# Patient Record
Sex: Female | Born: 1955 | Race: Black or African American | Hispanic: No | Marital: Married | Smoking: Former smoker
Health system: Southern US, Community
[De-identification: ages and names within clinical notes are randomized; demographics above are authoritative.]

## PROBLEM LIST (undated history)

## (undated) DIAGNOSIS — R001 Bradycardia, unspecified: Secondary | ICD-10-CM

## (undated) DIAGNOSIS — G4730 Sleep apnea, unspecified: Secondary | ICD-10-CM

## (undated) DIAGNOSIS — E662 Morbid (severe) obesity with alveolar hypoventilation: Secondary | ICD-10-CM

## (undated) DIAGNOSIS — L0390 Cellulitis, unspecified: Secondary | ICD-10-CM

## (undated) DIAGNOSIS — M109 Gout, unspecified: Secondary | ICD-10-CM

## (undated) DIAGNOSIS — I4729 Other ventricular tachycardia: Secondary | ICD-10-CM

## (undated) DIAGNOSIS — I472 Ventricular tachycardia: Secondary | ICD-10-CM

## (undated) DIAGNOSIS — I219 Acute myocardial infarction, unspecified: Secondary | ICD-10-CM

## (undated) DIAGNOSIS — J449 Chronic obstructive pulmonary disease, unspecified: Secondary | ICD-10-CM

## (undated) DIAGNOSIS — M1990 Unspecified osteoarthritis, unspecified site: Secondary | ICD-10-CM

## (undated) DIAGNOSIS — Z9889 Other specified postprocedural states: Secondary | ICD-10-CM

## (undated) DIAGNOSIS — I739 Peripheral vascular disease, unspecified: Secondary | ICD-10-CM

## (undated) DIAGNOSIS — K449 Diaphragmatic hernia without obstruction or gangrene: Secondary | ICD-10-CM

## (undated) DIAGNOSIS — I10 Essential (primary) hypertension: Secondary | ICD-10-CM

## (undated) DIAGNOSIS — E782 Mixed hyperlipidemia: Secondary | ICD-10-CM

## (undated) DIAGNOSIS — I471 Supraventricular tachycardia, unspecified: Secondary | ICD-10-CM

## (undated) DIAGNOSIS — I5032 Chronic diastolic (congestive) heart failure: Secondary | ICD-10-CM

## (undated) DIAGNOSIS — E119 Type 2 diabetes mellitus without complications: Secondary | ICD-10-CM

---

## 1979-01-20 HISTORY — PX: ABDOMINAL HYSTERECTOMY: SHX81

## 1989-01-19 DIAGNOSIS — I219 Acute myocardial infarction, unspecified: Secondary | ICD-10-CM

## 1989-01-19 HISTORY — DX: Acute myocardial infarction, unspecified: I21.9

## 1998-09-22 ENCOUNTER — Encounter: Payer: Self-pay | Admitting: *Deleted

## 1998-09-22 ENCOUNTER — Inpatient Hospital Stay (HOSPITAL_COMMUNITY): Admission: EM | Admit: 1998-09-22 | Discharge: 1998-09-24 | Payer: Self-pay | Admitting: Emergency Medicine

## 1998-09-23 ENCOUNTER — Encounter: Payer: Self-pay | Admitting: Emergency Medicine

## 1998-09-23 ENCOUNTER — Encounter: Payer: Self-pay | Admitting: *Deleted

## 2000-12-29 ENCOUNTER — Emergency Department (HOSPITAL_COMMUNITY): Admission: EM | Admit: 2000-12-29 | Discharge: 2000-12-29 | Payer: Self-pay | Admitting: Emergency Medicine

## 2001-03-15 ENCOUNTER — Emergency Department (HOSPITAL_COMMUNITY): Admission: EM | Admit: 2001-03-15 | Discharge: 2001-03-15 | Payer: Self-pay | Admitting: Emergency Medicine

## 2001-03-15 ENCOUNTER — Encounter: Payer: Self-pay | Admitting: Emergency Medicine

## 2001-09-08 ENCOUNTER — Encounter: Payer: Self-pay | Admitting: Emergency Medicine

## 2001-09-08 ENCOUNTER — Inpatient Hospital Stay (HOSPITAL_COMMUNITY): Admission: EM | Admit: 2001-09-08 | Discharge: 2001-09-12 | Payer: Self-pay | Admitting: Emergency Medicine

## 2002-03-26 ENCOUNTER — Encounter: Payer: Self-pay | Admitting: *Deleted

## 2002-03-26 ENCOUNTER — Emergency Department (HOSPITAL_COMMUNITY): Admission: EM | Admit: 2002-03-26 | Discharge: 2002-03-26 | Payer: Self-pay | Admitting: Emergency Medicine

## 2002-06-02 ENCOUNTER — Encounter: Payer: Self-pay | Admitting: Emergency Medicine

## 2002-06-02 ENCOUNTER — Inpatient Hospital Stay (HOSPITAL_COMMUNITY): Admission: EM | Admit: 2002-06-02 | Discharge: 2002-06-05 | Payer: Self-pay | Admitting: Emergency Medicine

## 2002-10-11 ENCOUNTER — Emergency Department (HOSPITAL_COMMUNITY): Admission: EM | Admit: 2002-10-11 | Discharge: 2002-10-11 | Payer: Self-pay | Admitting: Emergency Medicine

## 2003-02-16 ENCOUNTER — Emergency Department (HOSPITAL_COMMUNITY): Admission: EM | Admit: 2003-02-16 | Discharge: 2003-02-16 | Payer: Self-pay | Admitting: *Deleted

## 2003-02-16 ENCOUNTER — Encounter: Payer: Self-pay | Admitting: *Deleted

## 2003-10-06 ENCOUNTER — Emergency Department (HOSPITAL_COMMUNITY): Admission: EM | Admit: 2003-10-06 | Discharge: 2003-10-06 | Payer: Self-pay | Admitting: Emergency Medicine

## 2003-10-07 ENCOUNTER — Encounter (HOSPITAL_COMMUNITY): Admission: RE | Admit: 2003-10-07 | Discharge: 2003-11-06 | Payer: Self-pay | Admitting: Pulmonary Disease

## 2003-10-11 ENCOUNTER — Encounter (HOSPITAL_COMMUNITY): Admission: RE | Admit: 2003-10-11 | Discharge: 2003-10-14 | Payer: Self-pay | Admitting: Pulmonary Disease

## 2005-07-21 ENCOUNTER — Emergency Department (HOSPITAL_COMMUNITY): Admission: EM | Admit: 2005-07-21 | Discharge: 2005-07-21 | Payer: Self-pay | Admitting: Emergency Medicine

## 2005-09-02 ENCOUNTER — Emergency Department (HOSPITAL_COMMUNITY): Admission: EM | Admit: 2005-09-02 | Discharge: 2005-09-02 | Payer: Self-pay | Admitting: Emergency Medicine

## 2005-09-05 ENCOUNTER — Inpatient Hospital Stay (HOSPITAL_COMMUNITY): Admission: EM | Admit: 2005-09-05 | Discharge: 2005-09-09 | Payer: Self-pay | Admitting: Emergency Medicine

## 2006-01-24 ENCOUNTER — Inpatient Hospital Stay (HOSPITAL_COMMUNITY): Admission: EM | Admit: 2006-01-24 | Discharge: 2006-01-25 | Payer: Self-pay | Admitting: Emergency Medicine

## 2006-02-15 ENCOUNTER — Emergency Department (HOSPITAL_COMMUNITY): Admission: EM | Admit: 2006-02-15 | Discharge: 2006-02-15 | Payer: Self-pay | Admitting: Emergency Medicine

## 2006-03-10 ENCOUNTER — Emergency Department (HOSPITAL_COMMUNITY): Admission: EM | Admit: 2006-03-10 | Discharge: 2006-03-10 | Payer: Self-pay | Admitting: Emergency Medicine

## 2006-08-01 ENCOUNTER — Emergency Department (HOSPITAL_COMMUNITY): Admission: EM | Admit: 2006-08-01 | Discharge: 2006-08-02 | Payer: Self-pay | Admitting: Emergency Medicine

## 2007-02-09 ENCOUNTER — Emergency Department (HOSPITAL_COMMUNITY): Admission: EM | Admit: 2007-02-09 | Discharge: 2007-02-09 | Payer: Self-pay | Admitting: Emergency Medicine

## 2007-02-12 ENCOUNTER — Emergency Department (HOSPITAL_COMMUNITY): Admission: EM | Admit: 2007-02-12 | Discharge: 2007-02-12 | Payer: Self-pay | Admitting: Emergency Medicine

## 2007-03-22 ENCOUNTER — Inpatient Hospital Stay (HOSPITAL_COMMUNITY): Admission: EM | Admit: 2007-03-22 | Discharge: 2007-03-30 | Payer: Self-pay | Admitting: Emergency Medicine

## 2007-03-22 ENCOUNTER — Ambulatory Visit: Payer: Self-pay | Admitting: Cardiology

## 2007-03-24 ENCOUNTER — Encounter: Payer: Self-pay | Admitting: Cardiology

## 2007-04-16 ENCOUNTER — Ambulatory Visit: Payer: Self-pay

## 2007-05-25 ENCOUNTER — Inpatient Hospital Stay (HOSPITAL_COMMUNITY): Admission: EM | Admit: 2007-05-25 | Discharge: 2007-05-28 | Payer: Self-pay | Admitting: Emergency Medicine

## 2007-05-25 ENCOUNTER — Ambulatory Visit: Payer: Self-pay | Admitting: Cardiology

## 2007-06-05 ENCOUNTER — Emergency Department (HOSPITAL_COMMUNITY): Admission: EM | Admit: 2007-06-05 | Discharge: 2007-06-05 | Payer: Self-pay | Admitting: Emergency Medicine

## 2007-06-13 ENCOUNTER — Ambulatory Visit: Payer: Self-pay | Admitting: Cardiology

## 2007-07-08 ENCOUNTER — Inpatient Hospital Stay (HOSPITAL_COMMUNITY): Admission: EM | Admit: 2007-07-08 | Discharge: 2007-07-16 | Payer: Self-pay | Admitting: Emergency Medicine

## 2007-07-16 ENCOUNTER — Inpatient Hospital Stay (HOSPITAL_COMMUNITY)
Admission: RE | Admit: 2007-07-16 | Discharge: 2007-07-23 | Payer: Self-pay | Admitting: Physical Medicine & Rehabilitation

## 2007-07-16 ENCOUNTER — Ambulatory Visit: Payer: Self-pay | Admitting: Physical Medicine & Rehabilitation

## 2007-08-02 ENCOUNTER — Emergency Department (HOSPITAL_COMMUNITY): Admission: EM | Admit: 2007-08-02 | Discharge: 2007-08-02 | Payer: Self-pay | Admitting: Emergency Medicine

## 2007-11-26 ENCOUNTER — Ambulatory Visit: Payer: Self-pay | Admitting: Internal Medicine

## 2008-01-09 ENCOUNTER — Ambulatory Visit (HOSPITAL_COMMUNITY): Admission: RE | Admit: 2008-01-09 | Discharge: 2008-01-09 | Payer: Self-pay | Admitting: Licensed Clinical Social Worker

## 2008-02-13 ENCOUNTER — Encounter: Payer: Self-pay | Admitting: Internal Medicine

## 2008-02-13 ENCOUNTER — Ambulatory Visit: Payer: Self-pay | Admitting: Internal Medicine

## 2008-02-13 DIAGNOSIS — N318 Other neuromuscular dysfunction of bladder: Secondary | ICD-10-CM

## 2008-02-13 DIAGNOSIS — E118 Type 2 diabetes mellitus with unspecified complications: Secondary | ICD-10-CM

## 2008-02-13 DIAGNOSIS — G4733 Obstructive sleep apnea (adult) (pediatric): Secondary | ICD-10-CM

## 2008-02-13 DIAGNOSIS — E1165 Type 2 diabetes mellitus with hyperglycemia: Secondary | ICD-10-CM

## 2008-02-13 LAB — CONVERTED CEMR LAB
BUN: 11 mg/dL (ref 6–23)
Bilirubin Urine: NEGATIVE
Chloride: 102 meq/L (ref 96–112)
Ketones, urine, test strip: NEGATIVE
Nitrite: NEGATIVE
Potassium: 4 meq/L (ref 3.5–5.3)
Sodium: 142 meq/L (ref 135–145)
Specific Gravity, Urine: 1.025
Urobilinogen, UA: 0.2

## 2008-02-16 ENCOUNTER — Ambulatory Visit: Payer: Self-pay | Admitting: Internal Medicine

## 2008-02-16 DIAGNOSIS — M1990 Unspecified osteoarthritis, unspecified site: Secondary | ICD-10-CM | POA: Insufficient documentation

## 2008-02-16 DIAGNOSIS — K219 Gastro-esophageal reflux disease without esophagitis: Secondary | ICD-10-CM

## 2008-02-16 DIAGNOSIS — R0602 Shortness of breath: Secondary | ICD-10-CM

## 2008-02-16 DIAGNOSIS — Z8679 Personal history of other diseases of the circulatory system: Secondary | ICD-10-CM | POA: Insufficient documentation

## 2008-02-16 DIAGNOSIS — I10 Essential (primary) hypertension: Secondary | ICD-10-CM | POA: Insufficient documentation

## 2008-02-16 DIAGNOSIS — E785 Hyperlipidemia, unspecified: Secondary | ICD-10-CM | POA: Insufficient documentation

## 2008-02-16 DIAGNOSIS — M545 Low back pain, unspecified: Secondary | ICD-10-CM | POA: Insufficient documentation

## 2008-02-16 DIAGNOSIS — I2510 Atherosclerotic heart disease of native coronary artery without angina pectoris: Secondary | ICD-10-CM | POA: Insufficient documentation

## 2008-02-16 DIAGNOSIS — J45909 Unspecified asthma, uncomplicated: Secondary | ICD-10-CM | POA: Insufficient documentation

## 2008-02-16 DIAGNOSIS — M069 Rheumatoid arthritis, unspecified: Secondary | ICD-10-CM | POA: Insufficient documentation

## 2008-02-20 ENCOUNTER — Ambulatory Visit: Payer: Self-pay | Admitting: Internal Medicine

## 2008-03-05 ENCOUNTER — Ambulatory Visit: Payer: Self-pay | Admitting: Internal Medicine

## 2008-03-05 LAB — CONVERTED CEMR LAB: Blood Glucose, Fingerstick: 152

## 2008-04-09 ENCOUNTER — Ambulatory Visit: Payer: Self-pay | Admitting: Internal Medicine

## 2008-04-13 ENCOUNTER — Ambulatory Visit: Payer: Self-pay | Admitting: Internal Medicine

## 2008-04-28 ENCOUNTER — Ambulatory Visit: Payer: Self-pay | Admitting: Internal Medicine

## 2008-05-04 ENCOUNTER — Telehealth (INDEPENDENT_AMBULATORY_CARE_PROVIDER_SITE_OTHER): Payer: Self-pay | Admitting: *Deleted

## 2008-05-07 IMAGING — CT CT ANGIO CHEST
1 of 2 series · 19 of 32 positions shown · IV contrast (omnipaque)
Comparison: 06/05/07.

CLINICAL DATA: Short of breath.
 CT ANGIOGRAPHY OF CHEST:
TECHNIQUE: Multidetector CT imaging of the chest was performed during bolus injection of intravenous contrast.  Multiplanar CT angiographic image reconstructions were generated to evaluate the vascular anatomy.
 Contrast:  100 cc Omnipaque 350.

[Series 9: thin pacs · axial · 0.65mm/px · z∈[-286,-58]mm · 19 of 254 slices shown]
[im 13/254  lung]
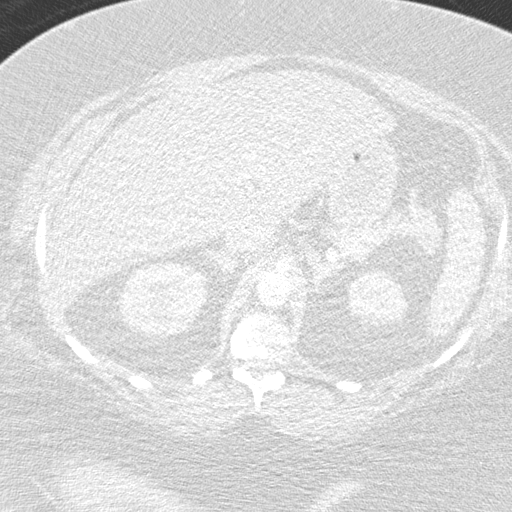
[im 26/254  mediastinal]
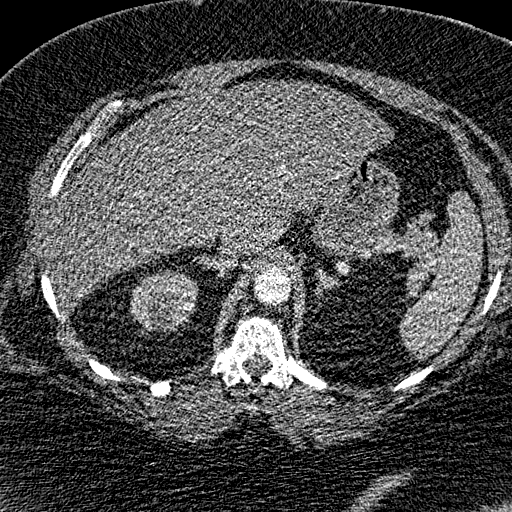
[im 38/254  lung]
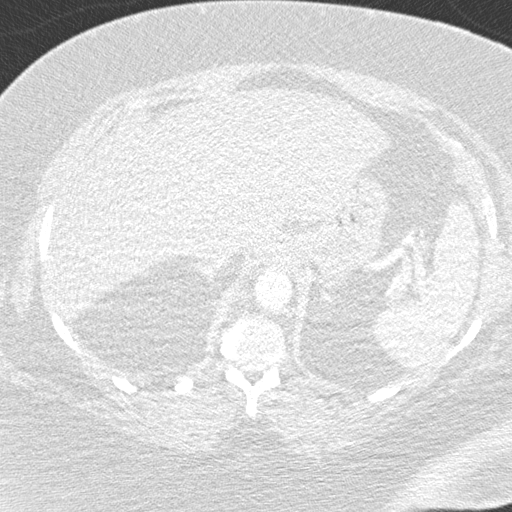
[im 64/254  mediastinal]
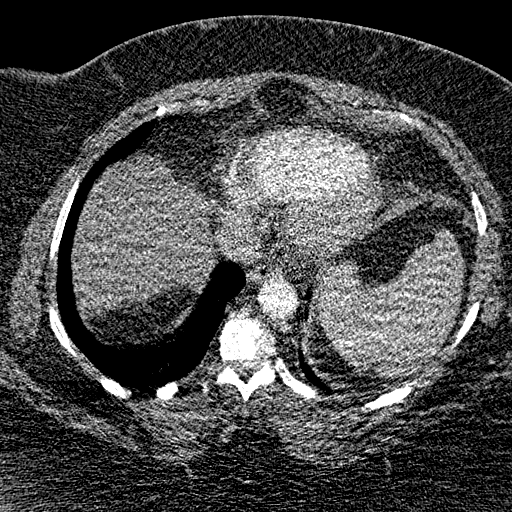
[im 76/254  lung]
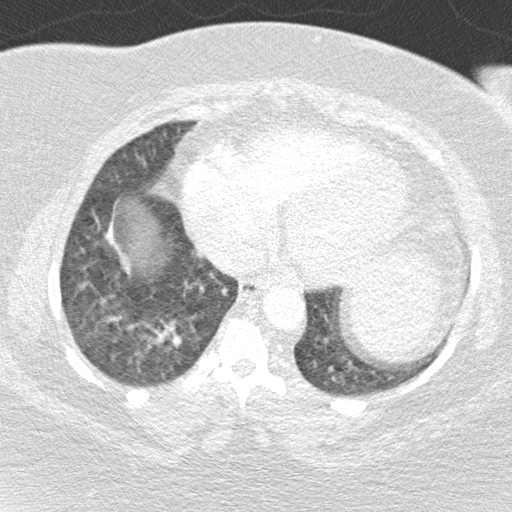
[im 85/254  mediastinal]
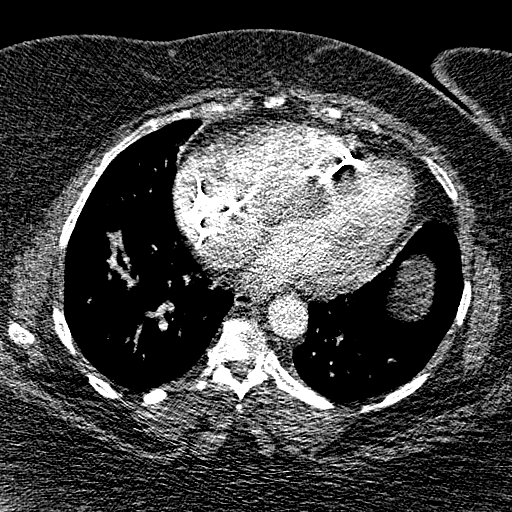
[im 89/254  lung]
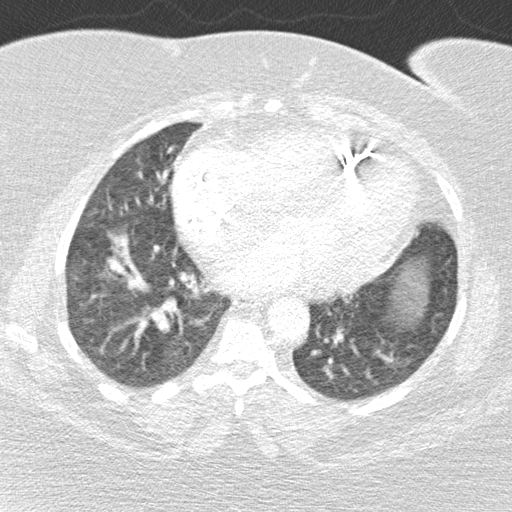
[im 102/254  mediastinal]
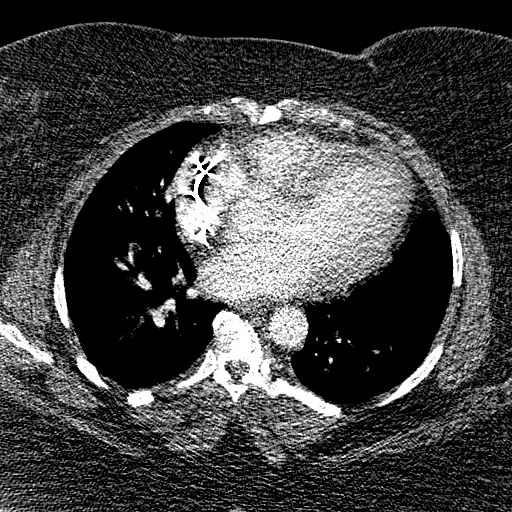
[im 114/254  lung]
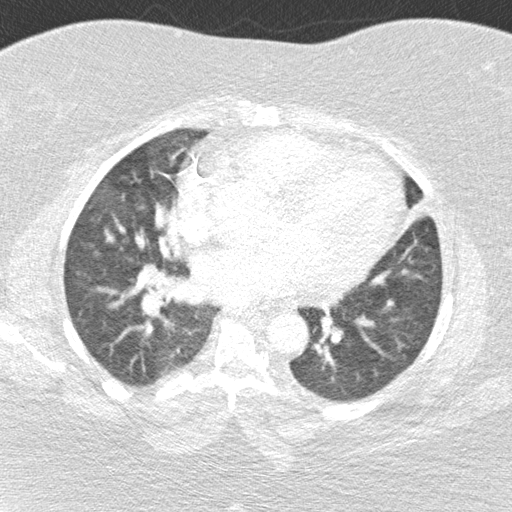
[im 127/254  mediastinal]
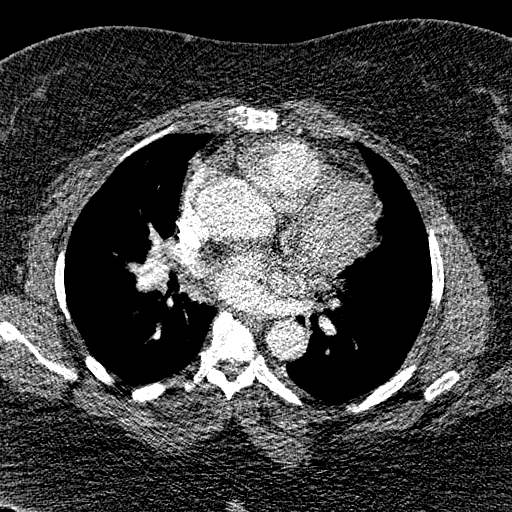
[im 140/254  lung]
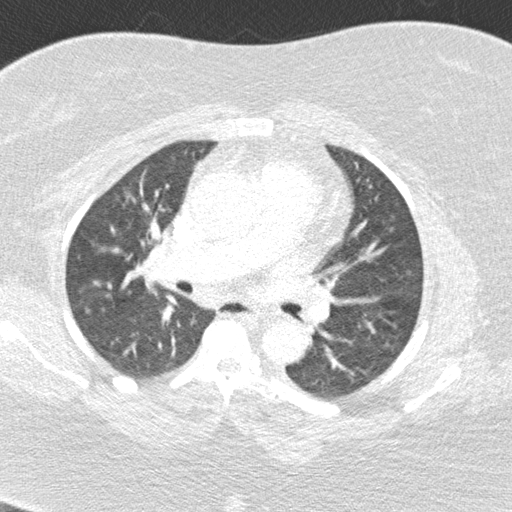
[im 152/254  mediastinal]
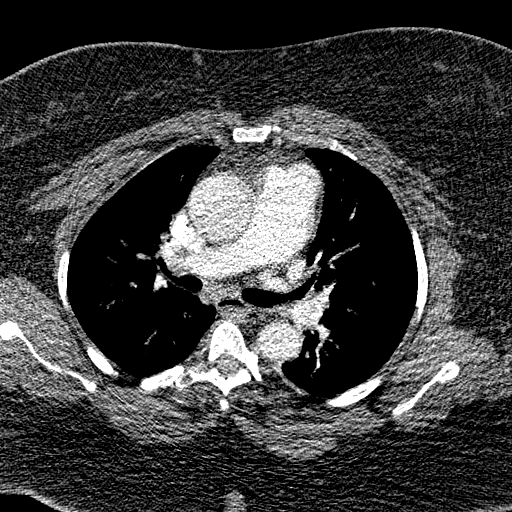
[im 165/254  lung]
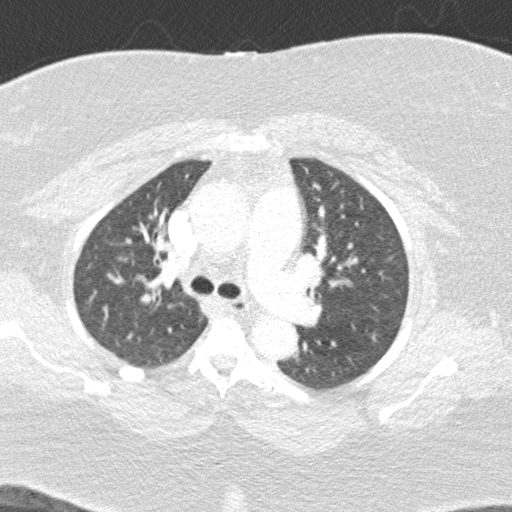
[im 169/254  mediastinal]
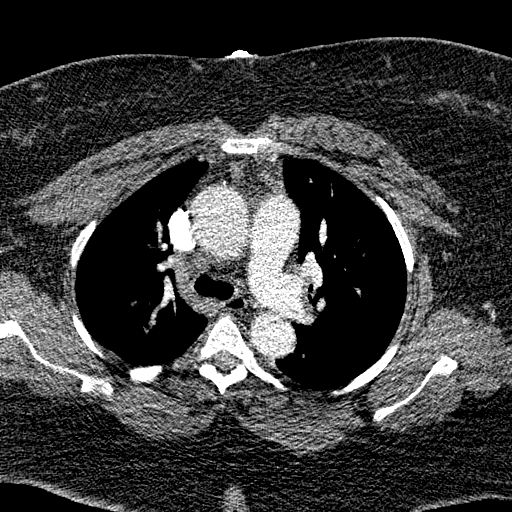
[im 178/254  lung]
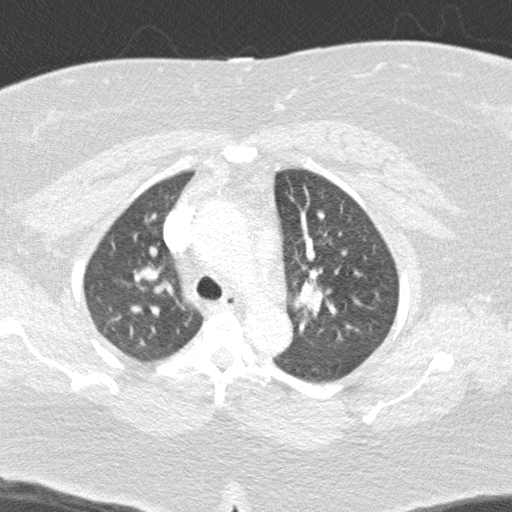
[im 190/254  mediastinal]
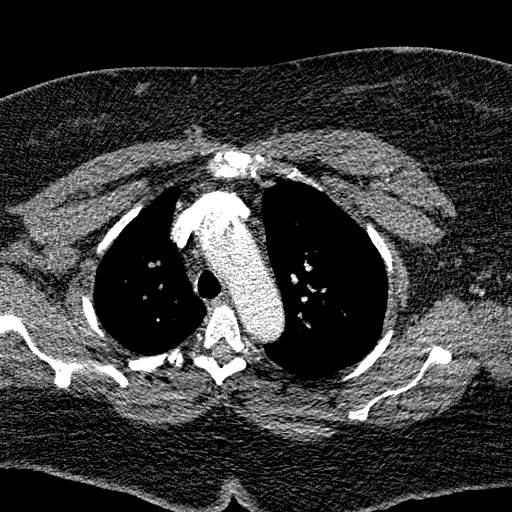
[im 216/254  lung]
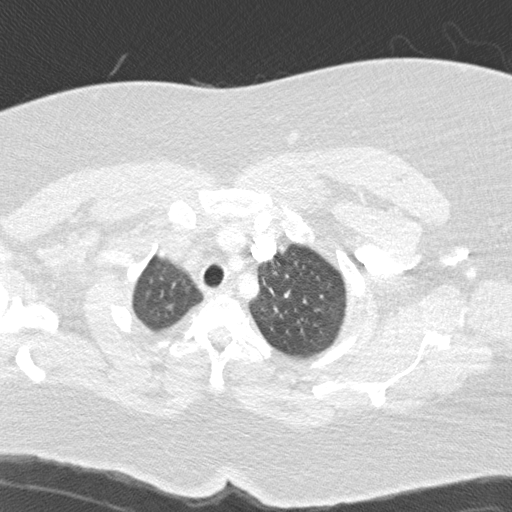
[im 228/254  mediastinal]
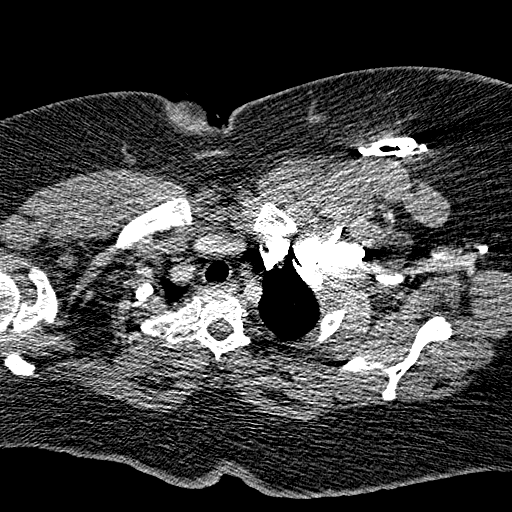
[im 241/254  lung]
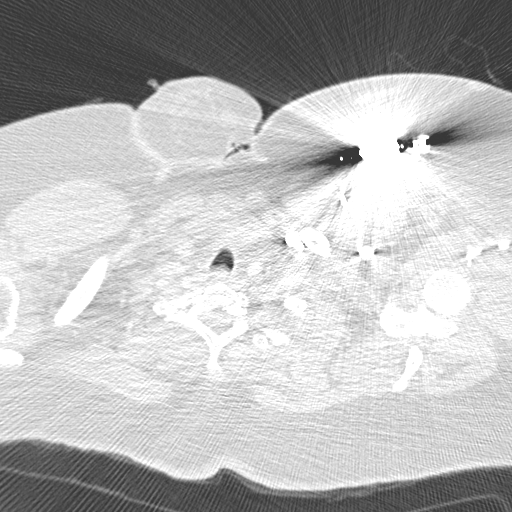

[19 of 32 positions shown; findings below may reference images not displayed]

FINDINGS: The study is significantly limited technically by the patient?s body habitus.  There are no definite pulmonary emboli demonstrated.  The large central vessels are fairly well opacified and I feel that there are no major emboli. I think it is difficult to rule out small peripheral emboli but I do not have a high index of suspicion for emboli.
 There is also breathing motion artifact that is causing some degradation of image quality.
 The lungs are essentially clear given the limitations of the breathing motion artifact at the bases.  No definite pleural or pericardial fluid. No gross adenopathy.
IMPRESSION: 1.  Study is limited technically by the patient?s body habitus, and breathing motion artifact.
 2.  No definite pulmonary emboli and no active pulmonary process identified.

## 2008-05-08 IMAGING — CR DG CHEST 1V PORT SAME DAY
1 series · 1 of 1 positions shown · non-contrast
Comparison: 07/09/07.

CLINICAL DATA: Endotracheal tube adjustment.
 PORTABLE CHEST - 1 VIEW:

[view not recorded]
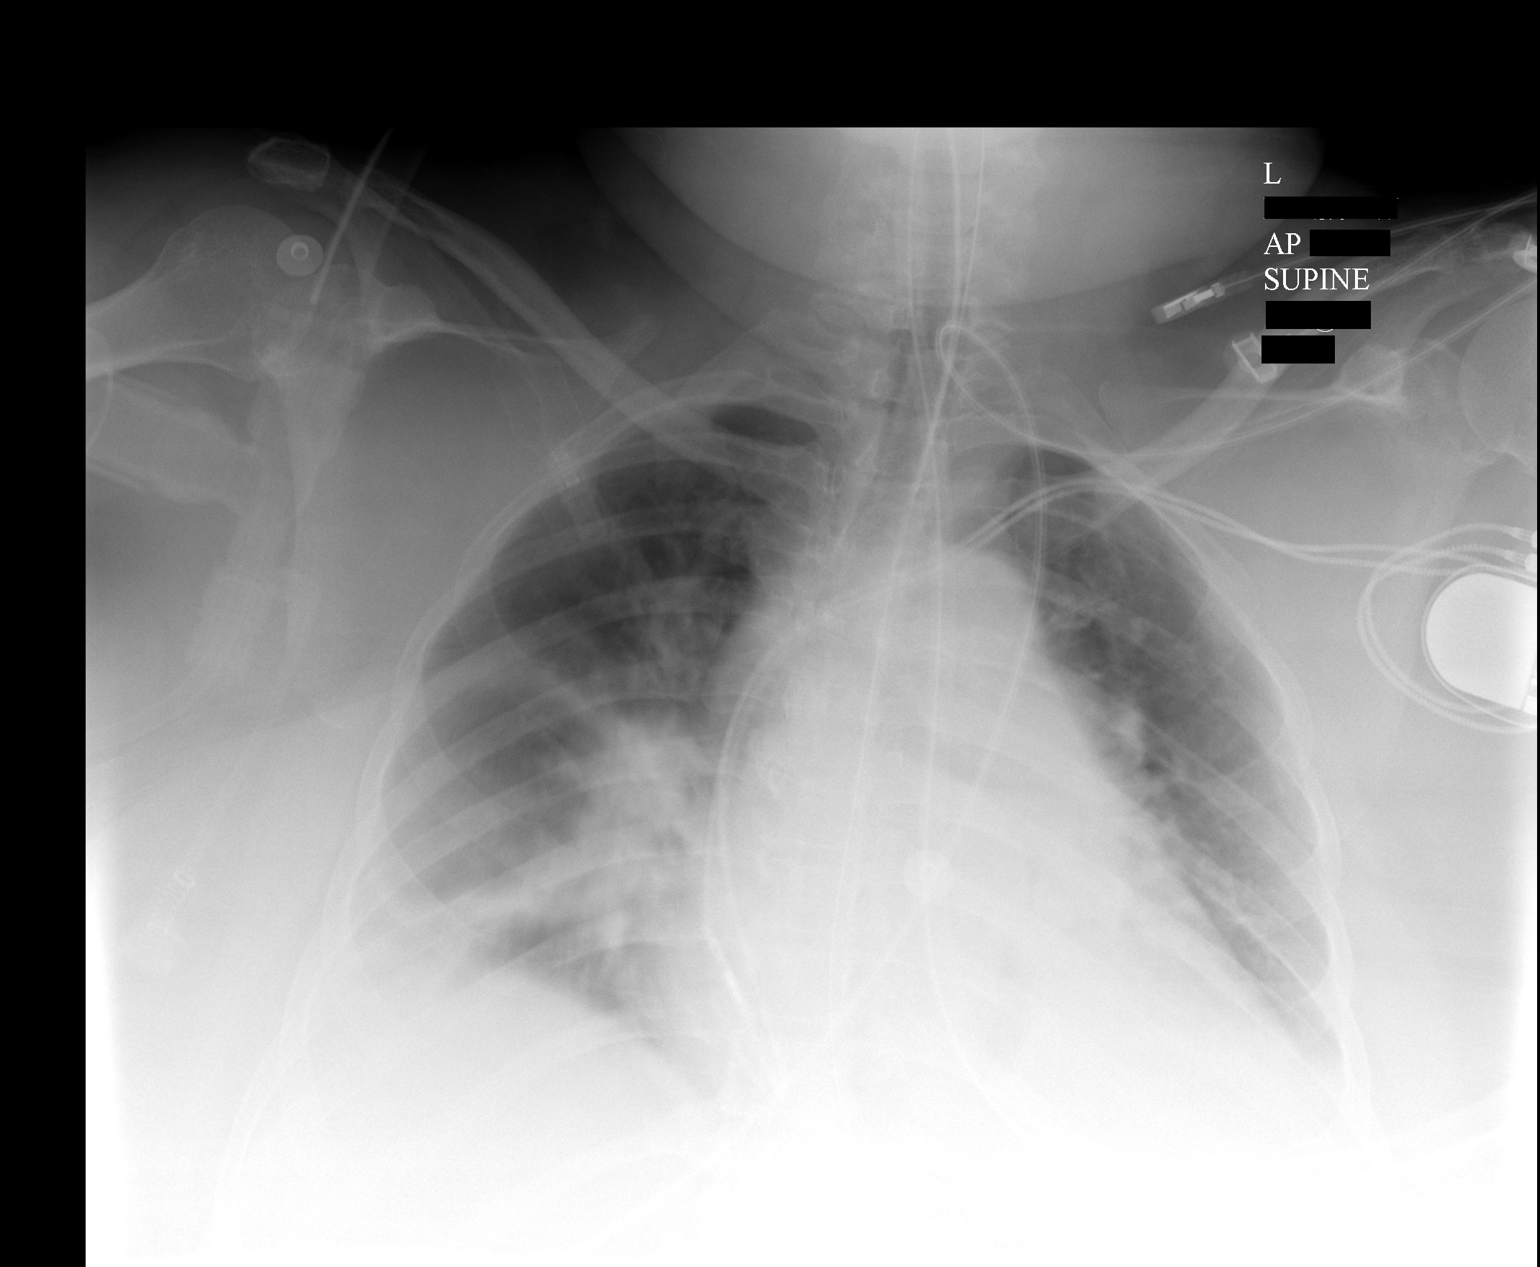

[1 of 1 positions shown; findings below may reference images not displayed]

FINDINGS: The endotracheal tube has been retracted and is now 4 cm above the carina.  Airspace opacities throughout the left lung have improved. Airspace disease in the right mid and lower lung zone is worse.  The NG tube is unchanged. No pneumothorax.
IMPRESSION: 1.  Endotracheal tube retracted.
 2.  Improved airspace disease throughout the left lung.
 3.  Worsening right basilar airspace disease.

## 2008-05-11 IMAGING — CR DG CHEST 1V PORT
1 series · 1 of 1 positions shown · non-contrast
Comparison: 07/11/2007

CLINICAL DATA: Respiratory failure. 
 PORTABLE CHEST [DATE]/[id number] HOURS:

[view not recorded]
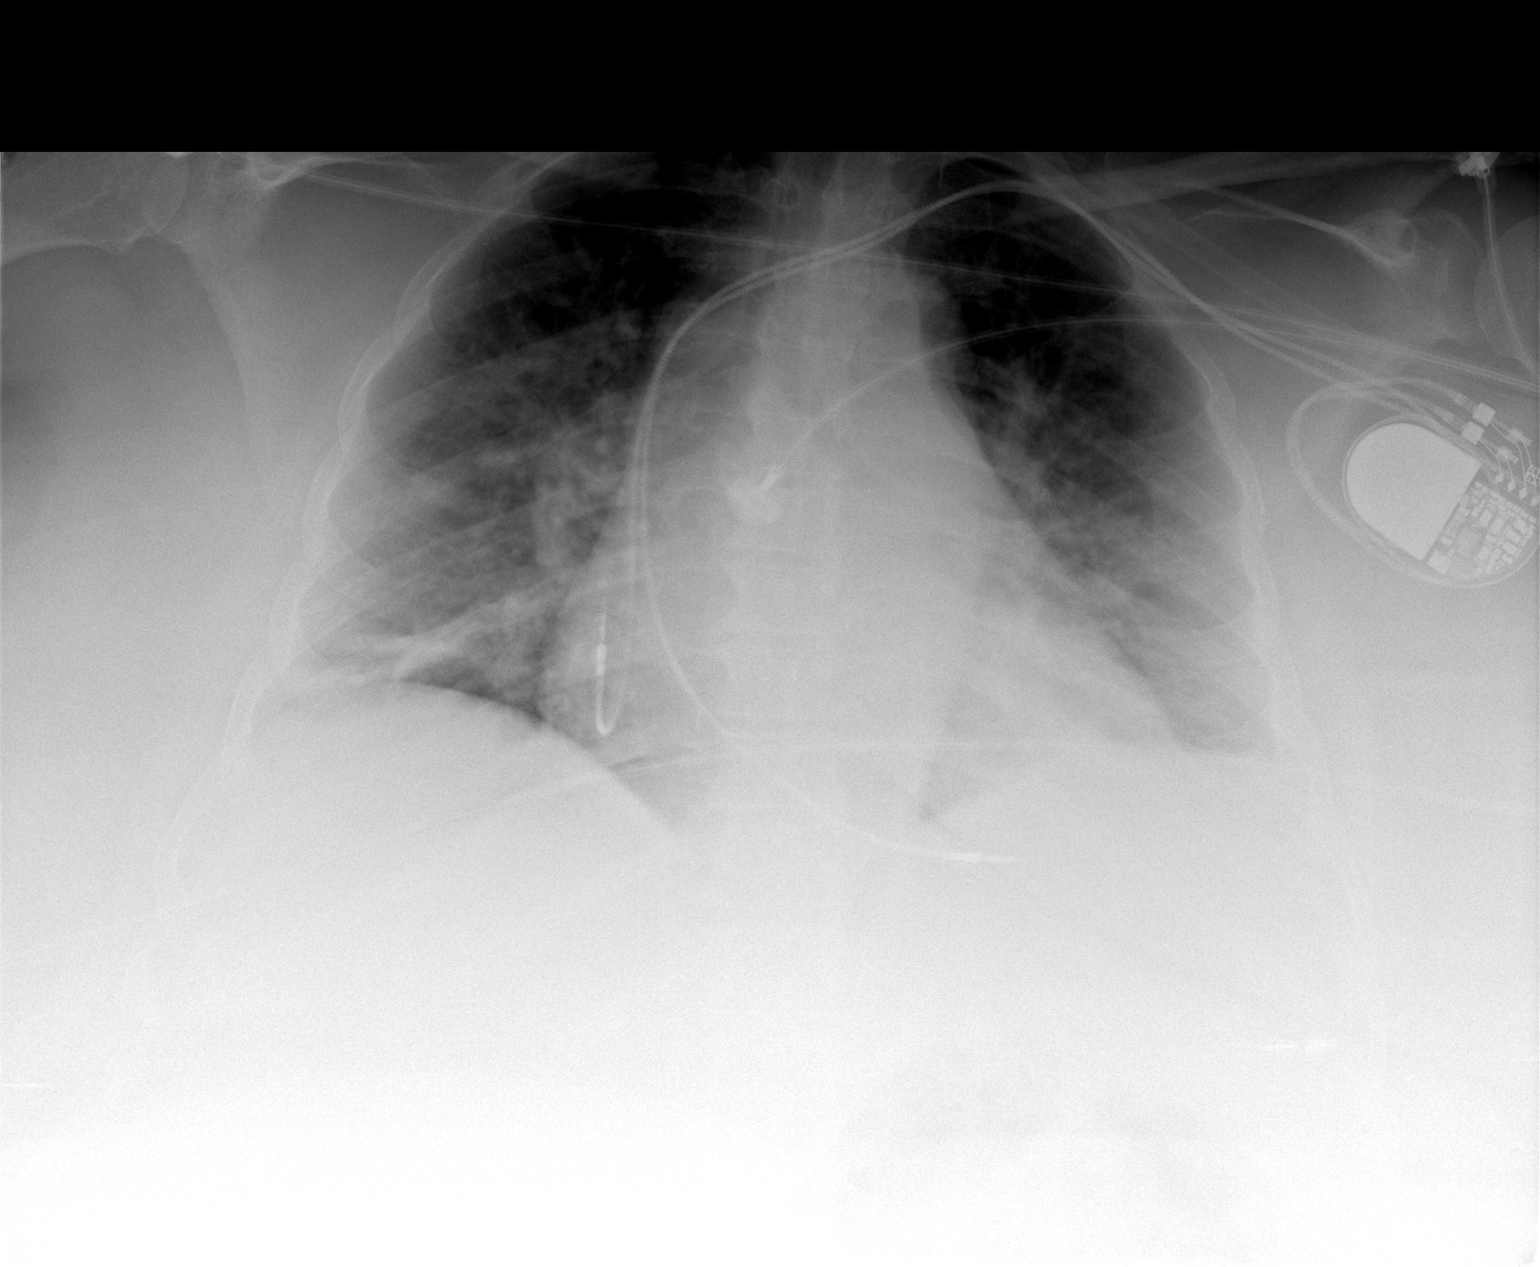

[1 of 1 positions shown; findings below may reference images not displayed]

FINDINGS: There remains atelectasis versus infiltrate in the right lower lung zone.  Stable edema.  The patient has been extubated.  Aeration at the left lung base has improved slightly.
IMPRESSION: Improved aeration at left lung base after extubation.  Residual atelectasis and/or infiltrate at the right lung base.

## 2008-05-17 ENCOUNTER — Telehealth (INDEPENDENT_AMBULATORY_CARE_PROVIDER_SITE_OTHER): Payer: Self-pay | Admitting: *Deleted

## 2008-05-21 HISTORY — PX: INSERT / REPLACE / REMOVE PACEMAKER: SUR710

## 2008-05-31 ENCOUNTER — Ambulatory Visit: Payer: Self-pay | Admitting: Internal Medicine

## 2008-06-14 ENCOUNTER — Ambulatory Visit: Payer: Self-pay | Admitting: Internal Medicine

## 2008-06-14 LAB — CONVERTED CEMR LAB
Blood Glucose, Fingerstick: 113
Hgb A1c MFr Bld: 6.7 %

## 2008-06-15 ENCOUNTER — Encounter (INDEPENDENT_AMBULATORY_CARE_PROVIDER_SITE_OTHER): Payer: Self-pay | Admitting: Internal Medicine

## 2008-06-16 LAB — CONVERTED CEMR LAB
AST: 10 units/L (ref 0–37)
Albumin: 4 g/dL (ref 3.5–5.2)
Alkaline Phosphatase: 74 units/L (ref 39–117)
Basophils Absolute: 0 10*3/uL (ref 0.0–0.1)
Basophils Relative: 0 % (ref 0–1)
Eosinophils Absolute: 0.3 10*3/uL (ref 0.0–0.7)
Eosinophils Relative: 3 % (ref 0–5)
HCT: 36.8 % (ref 36.0–46.0)
LDL Cholesterol: 121 mg/dL — ABNORMAL HIGH (ref 0–99)
MCHC: 31.3 g/dL (ref 30.0–36.0)
MCV: 86.4 fL (ref 78.0–100.0)
Microalb, Ur: 191 mg/dL — ABNORMAL HIGH (ref 0.00–1.89)
Neutrophils Relative %: 72 % (ref 43–77)
Platelets: 288 10*3/uL (ref 150–400)
Potassium: 3.8 meq/L (ref 3.5–5.3)
RDW: 18 % — ABNORMAL HIGH (ref 11.5–15.5)
Sodium: 145 meq/L (ref 135–145)
Total Bilirubin: 0.4 mg/dL (ref 0.3–1.2)
Total Protein: 6.8 g/dL (ref 6.0–8.3)
Triglycerides: 190 mg/dL — ABNORMAL HIGH (ref <150)
VLDL: 38 mg/dL (ref 0–40)
WBC: 7.9 10*3/uL (ref 4.0–10.5)

## 2008-07-13 ENCOUNTER — Encounter (INDEPENDENT_AMBULATORY_CARE_PROVIDER_SITE_OTHER): Payer: Self-pay | Admitting: Internal Medicine

## 2008-07-28 ENCOUNTER — Emergency Department (HOSPITAL_COMMUNITY): Admission: EM | Admit: 2008-07-28 | Discharge: 2008-07-28 | Payer: Self-pay | Admitting: Emergency Medicine

## 2008-07-30 ENCOUNTER — Ambulatory Visit: Payer: Self-pay | Admitting: Internal Medicine

## 2008-08-27 ENCOUNTER — Encounter (INDEPENDENT_AMBULATORY_CARE_PROVIDER_SITE_OTHER): Payer: Self-pay

## 2008-09-18 ENCOUNTER — Emergency Department (HOSPITAL_COMMUNITY): Admission: EM | Admit: 2008-09-18 | Discharge: 2008-09-18 | Payer: Self-pay | Admitting: Emergency Medicine

## 2008-09-27 ENCOUNTER — Telehealth (INDEPENDENT_AMBULATORY_CARE_PROVIDER_SITE_OTHER): Payer: Self-pay | Admitting: Internal Medicine

## 2008-10-25 ENCOUNTER — Telehealth (INDEPENDENT_AMBULATORY_CARE_PROVIDER_SITE_OTHER): Payer: Self-pay | Admitting: Internal Medicine

## 2008-10-25 ENCOUNTER — Emergency Department (HOSPITAL_COMMUNITY): Admission: EM | Admit: 2008-10-25 | Discharge: 2008-10-25 | Payer: Self-pay | Admitting: Emergency Medicine

## 2008-10-27 ENCOUNTER — Ambulatory Visit: Payer: Self-pay | Admitting: Internal Medicine

## 2008-10-27 DIAGNOSIS — D509 Iron deficiency anemia, unspecified: Secondary | ICD-10-CM | POA: Insufficient documentation

## 2008-10-27 DIAGNOSIS — M25569 Pain in unspecified knee: Secondary | ICD-10-CM

## 2008-11-10 ENCOUNTER — Ambulatory Visit: Payer: Self-pay | Admitting: Internal Medicine

## 2008-11-10 LAB — CONVERTED CEMR LAB: Blood Glucose, Fingerstick: 127

## 2008-12-09 ENCOUNTER — Emergency Department (HOSPITAL_COMMUNITY): Admission: EM | Admit: 2008-12-09 | Discharge: 2008-12-09 | Payer: Self-pay | Admitting: Emergency Medicine

## 2008-12-14 ENCOUNTER — Ambulatory Visit: Payer: Self-pay | Admitting: Internal Medicine

## 2008-12-14 DIAGNOSIS — I498 Other specified cardiac arrhythmias: Secondary | ICD-10-CM | POA: Insufficient documentation

## 2008-12-20 ENCOUNTER — Encounter: Payer: Self-pay | Admitting: Adult Health

## 2008-12-20 ENCOUNTER — Ambulatory Visit: Payer: Self-pay

## 2009-02-24 ENCOUNTER — Ambulatory Visit: Payer: Self-pay | Admitting: Cardiovascular Disease

## 2009-02-25 ENCOUNTER — Encounter (INDEPENDENT_AMBULATORY_CARE_PROVIDER_SITE_OTHER): Payer: Self-pay | Admitting: Internal Medicine

## 2009-02-25 ENCOUNTER — Ambulatory Visit: Payer: Self-pay | Admitting: Internal Medicine

## 2009-02-25 ENCOUNTER — Encounter: Payer: Self-pay | Admitting: Internal Medicine

## 2009-02-25 ENCOUNTER — Inpatient Hospital Stay (HOSPITAL_COMMUNITY): Admission: AD | Admit: 2009-02-25 | Discharge: 2009-03-02 | Payer: Self-pay | Admitting: Cardiology

## 2009-02-25 ENCOUNTER — Ambulatory Visit: Payer: Self-pay | Admitting: Cardiovascular Disease

## 2009-02-27 ENCOUNTER — Encounter: Payer: Self-pay | Admitting: Cardiology

## 2009-02-28 ENCOUNTER — Encounter (INDEPENDENT_AMBULATORY_CARE_PROVIDER_SITE_OTHER): Payer: Self-pay | Admitting: Internal Medicine

## 2009-03-03 ENCOUNTER — Encounter: Payer: Self-pay | Admitting: Cardiology

## 2009-03-14 ENCOUNTER — Encounter: Payer: Self-pay | Admitting: Cardiology

## 2009-03-14 LAB — CONVERTED CEMR LAB
BUN: 21 mg/dL (ref 6–23)
Calcium: 9.4 mg/dL (ref 8.4–10.5)
Glucose, Bld: 99 mg/dL (ref 70–99)
Potassium: 4.1 meq/L (ref 3.5–5.3)
Sodium: 142 meq/L (ref 135–145)

## 2009-03-16 ENCOUNTER — Encounter (INDEPENDENT_AMBULATORY_CARE_PROVIDER_SITE_OTHER): Payer: Self-pay | Admitting: *Deleted

## 2009-03-17 ENCOUNTER — Ambulatory Visit: Payer: Self-pay | Admitting: Cardiology

## 2009-04-19 ENCOUNTER — Encounter: Payer: Self-pay | Admitting: Emergency Medicine

## 2009-04-19 ENCOUNTER — Ambulatory Visit: Payer: Self-pay | Admitting: Cardiology

## 2009-04-19 ENCOUNTER — Encounter (INDEPENDENT_AMBULATORY_CARE_PROVIDER_SITE_OTHER): Payer: Self-pay | Admitting: *Deleted

## 2009-04-19 ENCOUNTER — Observation Stay (HOSPITAL_COMMUNITY): Admission: AD | Admit: 2009-04-19 | Discharge: 2009-04-21 | Payer: Self-pay | Admitting: Cardiology

## 2009-04-19 LAB — CONVERTED CEMR LAB
ALT: 14 units/L
AST: 18 units/L
CO2: 34 meq/L
GFR calc non Af Amer: >60 mL/min
Glomerular Filtration Rate, Af Am: >60 mL/min/{1.73_m2}
Glucose, Bld: 152 mg/dL
Potassium: 3.7 meq/L
Sodium: 139 meq/L
Total Protein: 7.1 g/dL

## 2009-06-15 ENCOUNTER — Encounter (INDEPENDENT_AMBULATORY_CARE_PROVIDER_SITE_OTHER): Payer: Self-pay | Admitting: *Deleted

## 2009-06-22 DIAGNOSIS — I442 Atrioventricular block, complete: Secondary | ICD-10-CM

## 2009-06-22 DIAGNOSIS — I5033 Acute on chronic diastolic (congestive) heart failure: Secondary | ICD-10-CM

## 2009-06-22 DIAGNOSIS — Z950 Presence of cardiac pacemaker: Secondary | ICD-10-CM

## 2009-07-06 ENCOUNTER — Encounter: Payer: Self-pay | Admitting: Cardiology

## 2009-07-08 ENCOUNTER — Encounter: Payer: Self-pay | Admitting: Internal Medicine

## 2009-07-08 ENCOUNTER — Ambulatory Visit: Payer: Self-pay | Admitting: Cardiology

## 2009-07-14 ENCOUNTER — Ambulatory Visit: Payer: Self-pay | Admitting: Cardiology

## 2009-07-14 ENCOUNTER — Encounter: Payer: Self-pay | Admitting: Cardiology

## 2009-07-14 ENCOUNTER — Inpatient Hospital Stay (HOSPITAL_COMMUNITY): Admission: EM | Admit: 2009-07-14 | Discharge: 2009-07-26 | Payer: Self-pay | Admitting: Emergency Medicine

## 2009-08-08 ENCOUNTER — Encounter (INDEPENDENT_AMBULATORY_CARE_PROVIDER_SITE_OTHER): Payer: Self-pay | Admitting: *Deleted

## 2009-08-10 ENCOUNTER — Encounter: Payer: Self-pay | Admitting: Internal Medicine

## 2009-08-25 IMAGING — CR DG CHEST 1V PORT
1 series · 1 of 1 positions shown · non-contrast
Comparison: 09/18/2008

CLINICAL DATA: Chest pain, shortness of breath

PORTABLE CHEST - 1 VIEW

[view not recorded]
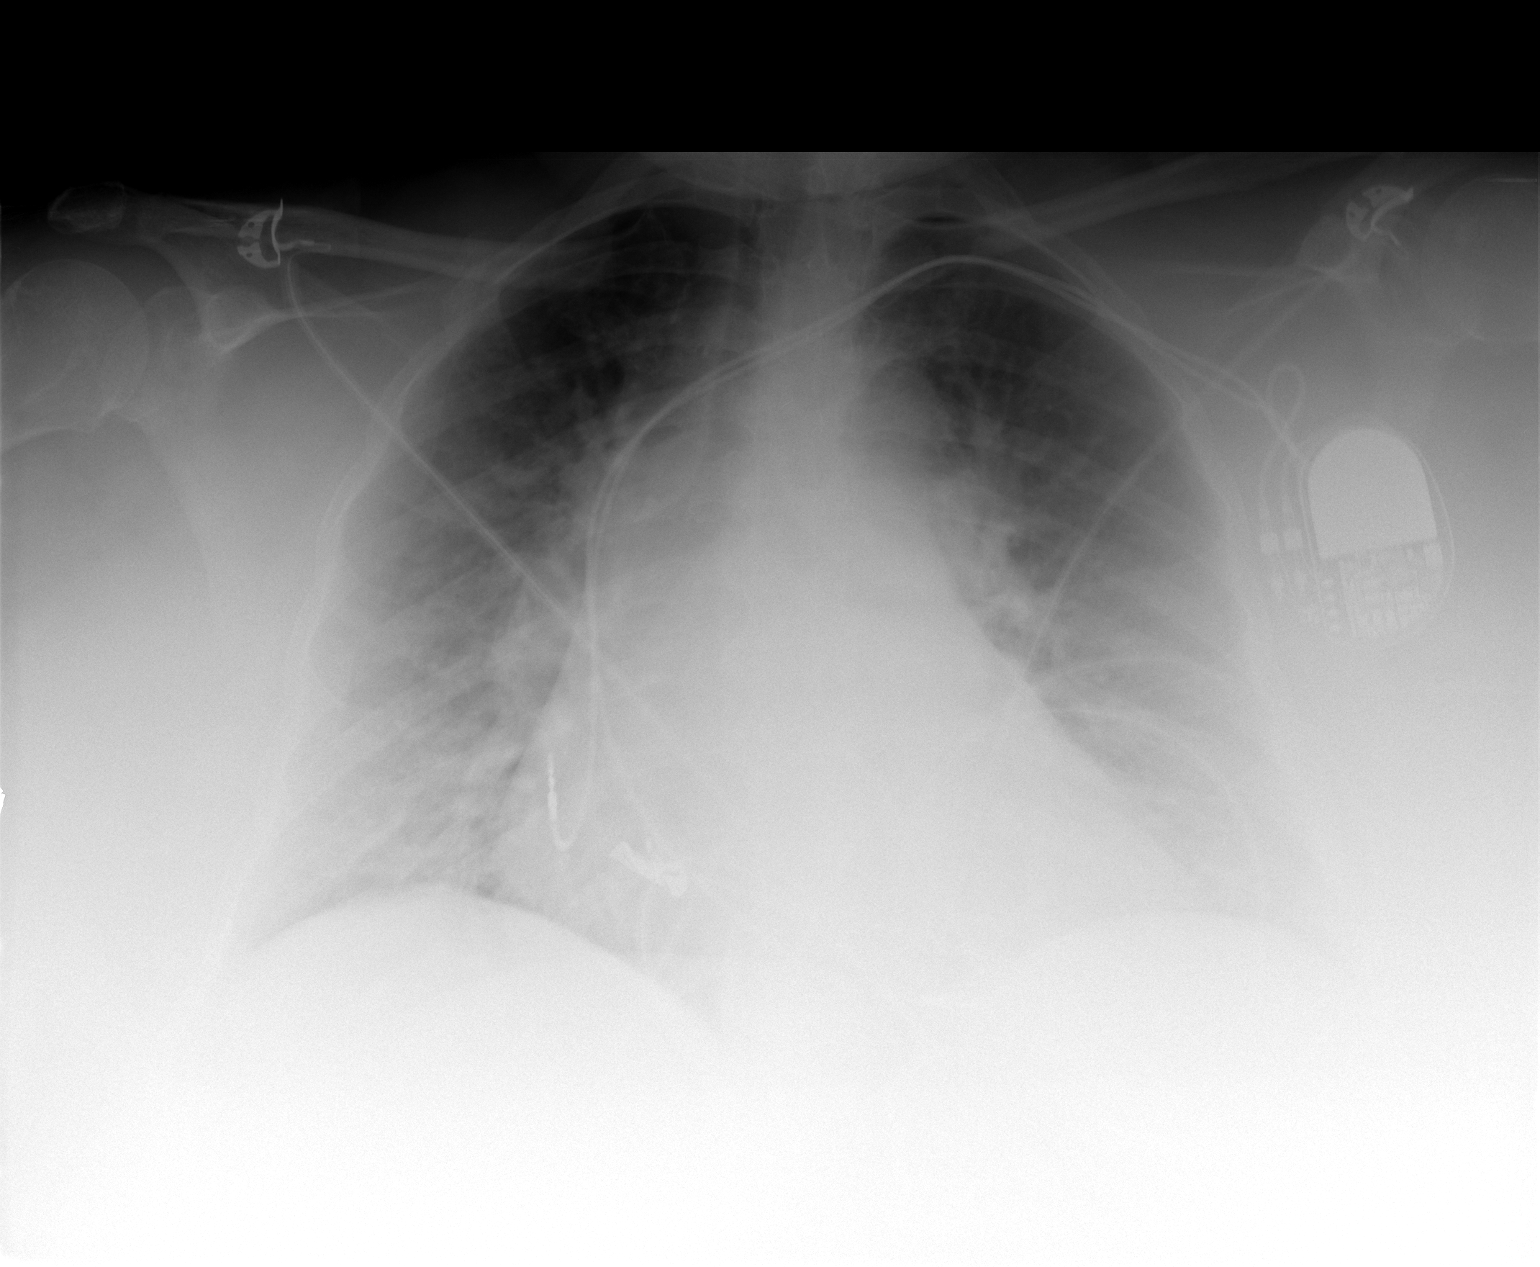

[1 of 1 positions shown; findings below may reference images not displayed]

FINDINGS: Fine detail obscured by patient obesity, motion, and
portable technique.  Dual lead left-sided pacer again noted.
Cardiomegaly noted without overt edema.  Lungs are grossly clear
but central vascular congestion noted.
IMPRESSION: Cardiomegaly and central vascular congestion.  Findings would be
better assessed on non portable PA and lateral chest radiographs
obtained at full inspiration when the patient is able.

## 2009-09-14 ENCOUNTER — Encounter: Payer: Self-pay | Admitting: Internal Medicine

## 2009-09-17 ENCOUNTER — Encounter (INDEPENDENT_AMBULATORY_CARE_PROVIDER_SITE_OTHER): Payer: Self-pay | Admitting: *Deleted

## 2009-09-17 LAB — CONVERTED CEMR LAB
CO2: 34 meq/L
Chloride: 98 meq/L
MCV: 87.3 fL
Platelets: 263 10*3/uL
Potassium: 3.7 meq/L
WBC: 9.3 10*3/uL

## 2009-09-23 ENCOUNTER — Encounter (INDEPENDENT_AMBULATORY_CARE_PROVIDER_SITE_OTHER): Payer: Self-pay | Admitting: *Deleted

## 2009-09-27 ENCOUNTER — Encounter (INDEPENDENT_AMBULATORY_CARE_PROVIDER_SITE_OTHER): Payer: Self-pay | Admitting: *Deleted

## 2009-10-08 ENCOUNTER — Encounter (INDEPENDENT_AMBULATORY_CARE_PROVIDER_SITE_OTHER): Payer: Self-pay | Admitting: *Deleted

## 2009-10-08 ENCOUNTER — Emergency Department (HOSPITAL_COMMUNITY): Admission: EM | Admit: 2009-10-08 | Discharge: 2009-10-08 | Payer: Self-pay | Admitting: Emergency Medicine

## 2009-10-08 LAB — CONVERTED CEMR LAB
Basophils Relative: 0 %
CK-MB: <1 ng/mL
Calcium: 9.4 mg/dL
Eosinophils Absolute: 0.5 10*3/uL
GFR calc non Af Amer: 59 mL/min
Glomerular Filtration Rate, Af Am: >60 mL/min/{1.73_m2}
Glucose, Bld: 116 mg/dL
HCT: 30 %
Hemoglobin: 10 g/dL
Lymphocytes Relative: 14 %
MCHC: 33.4 g/dL
MCV: 85.8 fL
Monocytes Absolute: 0.8 10*3/uL
Platelets: 245 10*3/uL
Potassium: 4.3 meq/L
Sodium: 140 meq/L
Troponin I: 0.05 ng/mL

## 2009-10-26 ENCOUNTER — Encounter: Payer: Self-pay | Admitting: Internal Medicine

## 2009-11-28 ENCOUNTER — Encounter (INDEPENDENT_AMBULATORY_CARE_PROVIDER_SITE_OTHER): Payer: Self-pay | Admitting: *Deleted

## 2009-12-01 ENCOUNTER — Encounter (INDEPENDENT_AMBULATORY_CARE_PROVIDER_SITE_OTHER): Payer: Self-pay | Admitting: *Deleted

## 2009-12-19 ENCOUNTER — Encounter (INDEPENDENT_AMBULATORY_CARE_PROVIDER_SITE_OTHER): Payer: Self-pay | Admitting: *Deleted

## 2010-01-04 ENCOUNTER — Inpatient Hospital Stay (HOSPITAL_COMMUNITY)
Admission: EM | Admit: 2010-01-04 | Discharge: 2010-01-12 | Payer: Self-pay | Source: Home / Self Care | Admitting: Emergency Medicine

## 2010-01-04 ENCOUNTER — Ambulatory Visit: Payer: Self-pay | Admitting: Cardiology

## 2010-02-16 ENCOUNTER — Encounter: Payer: Self-pay | Admitting: Internal Medicine

## 2010-02-17 ENCOUNTER — Encounter (INDEPENDENT_AMBULATORY_CARE_PROVIDER_SITE_OTHER): Payer: Self-pay | Admitting: *Deleted

## 2010-04-27 ENCOUNTER — Encounter (INDEPENDENT_AMBULATORY_CARE_PROVIDER_SITE_OTHER): Payer: Self-pay | Admitting: *Deleted

## 2010-06-11 ENCOUNTER — Encounter: Payer: Self-pay | Admitting: Internal Medicine

## 2010-06-11 ENCOUNTER — Encounter: Payer: Self-pay | Admitting: Pulmonary Disease

## 2010-06-11 ENCOUNTER — Encounter: Payer: Self-pay | Admitting: Family Medicine

## 2010-06-20 NOTE — Letter (Signed)
Summary: Device-Delinquent Check  [facility], Main Office  [address]   [place], [place] [address]   Phone: [phone]  Fax: [phone]     February 17, 2010 MRN: [id number]   LANICE FOLDEN [address] [place], [place]  [address]   Dear Ms. Melcher,  According to our records, you have not had your implanted device checked in the recommended period of time.  We are unable to determine appropriate device function without checking your device on a regular basis.  Please call our office to schedule an appointment with Dr Ladona Ridgel,  as soon as possible.  If you are having your device checked by another physician, please call us so that we may update our records.  Thank you,  Letta Moynahan, EMT  February 17, 2010 1:48 PM  [facility]  certified

## 2010-06-20 NOTE — Miscellaneous (Signed)
Summary: HOSPITAL LABS 10/08/2009  Clinical Lists Changes  Observations: Added new observation of ABSOLUTE BAS: 0.0 K/uL (10/08/2009 9:16) Added new observation of BASOPHIL %: 0 % (10/08/2009 9:16) Added new observation of EOS ABSLT: 0.5 K/uL (10/08/2009 9:16) Added new observation of ABSOLUTE MON: 0.8 K/uL (10/08/2009 9:16) Added new observation of MONOCYTE %: 8 % (10/08/2009 9:16) Added new observation of ABS LYMPHOCY: 1.3 K/uL (10/08/2009 9:16) Added new observation of LYMPHS %: 14 % (10/08/2009 9:16) Added new observation of ABS NEUTROPH: 73 K/uL (10/08/2009 9:16) Added new observation of RDW: 17.7 % (10/08/2009 9:16) Added new observation of MCHC RBC: 33.4 g/dL (16/02/9603 5:40) Added new observation of TROPONIN I: ,0.05 ng/mL (10/08/2009 9:16) Added new observation of CK-MB ISOENZ: <1.0 ng/mL (10/08/2009 9:16) Added new observation of CALCIUM: 9.4 mg/dL (98/03/9146 8:29) Added new observation of GFR AA: >60 mL/min/1.49m2 (10/08/2009 9:16) Added new observation of GFR: 59 mL/min (10/08/2009 9:16) Added new observation of CREATININE: 0.98 mg/dL (56/21/3086 5:78) Added new observation of BUN: 12 mg/dL (46/96/2952 8:41) Added new observation of BG RANDOM: 116 mg/dL (32/44/0102 7:25) Added new observation of CO2 PLSM/SER: 36 meq/L (10/08/2009 9:16) Added new observation of CL SERUM: 97 meq/L (10/08/2009 9:16) Added new observation of K SERUM: 4.3 meq/L (10/08/2009 9:16) Added new observation of NA: 140 meq/L (10/08/2009 9:16) Added new observation of PLATELETK/UL: 245 K/uL (10/08/2009 9:16) Added new observation of MCV: 85.8 fL (10/08/2009 9:16) Added new observation of HCT: 30.0 % (10/08/2009 9:16) Added new observation of HGB: 10.0 g/dL (36/64/4034 7:42) Added new observation of WBC COUNT: 9.4 10*3/microliter (10/08/2009 9:16)

## 2010-06-20 NOTE — Letter (Signed)
Summary: Appointment - Reminder 2  [facility] [place]. [address] [place] [address]   Phone: [phone]  Fax: [phone]     April 27, 2010 MRN: [id number]   Alvis VANNATTER [address] [place], [place]  [address]   Dear Ms. Smyre,  Our records indicate that it is time to schedule a follow-up appointment.  Dr. Ladona Ridgel       recommended that you follow up with [place] in  12/2009 PAST DUE          . It is very important that we reach you to schedule this appointment. We look forward to participating in your health care needs. Please contact us at the number listed above at your earliest convenience to schedule your appointment.  If you are unable to make an appointment at this time, give [place] a call so we can update our records.     Sincerely,   Glass blower/designer

## 2010-06-20 NOTE — Cardiovascular Report (Signed)
Summary: Office Visit   Office Visit   Imported By: Roderic Ovens 07/13/2009 16:30:53  _____________________________________________________________________  External Attachment:    Type:   Image     Comment:   External Document

## 2010-06-20 NOTE — Assessment & Plan Note (Signed)
Summary: pt having some problems w/ chest heaviness/tg  Medications Added LISINOPRIL 20 MG TABS (LISINOPRIL) take 1 tab daily FUROSEMIDE 80 MG TABS (FUROSEMIDE) take 1 tab two times a day FLECAINIDE ACETATE 100 MG TABS (FLECAINIDE ACETATE) take 1 tab two times a day      Allergies Added: NKDA  Visit Type:  Follow-up Primary Provider:  Dr.Gerald Hill   History of Present Illness: [name] is seen in the office today at her request for the development of progressive dyspnea.  This nice woman is challenged by morbid obesity and Pickwickian Syndrome, but has noted a clear increase in symptoms in recent weeks.  She has not been following her weights.  She made preliminary inquiries with our bariatric surgery program, but did not followup.  She has had a nonproductive cough.  She has noted no fever nor rigors.  She sleeps in a semirecumbent position chronically so it is not clear whether she has developed orthopnea or PND.  She has noted no increase in chronic edema.  She denies chest discomfort, lightheadedness and syncope.  Current Medications (verified): 1)  Glipizide 5 Mg Tabs (Glipizide) .Marland Kitchen.. 1 By Mouth Once Daily 2)  Spironolactone 25 Mg Tabs (Spironolactone) .... One By Mouth Daily 3)  Albuterol Sulfate (2.5 Mg/81ml) 0.083% Nebu (Albuterol Sulfate) .Marland Kitchen.. 1 in Nebulizer Q 6 Hours As Needed Shortness of Breath 4)  Lisinopril 20 Mg Tabs (Lisinopril) .... Take 1 Tab Daily 5)  Aspirin 81 Mg Tbec (Aspirin) .... Take One Tablet By Mouth Daily 6)  Bp Cuff .... Take Bp Every Morning and Evening At The Same Time Daily 7)  Klor-Con M20 20 Meq Cr-Tabs (Potassium Chloride Crys Cr) .... Take 1 Tab Two Times A Day 8)  Furosemide 80 Mg Tabs (Furosemide) .... Take 1 Tab Two Times A Day 9)  Flecainide Acetate 100 Mg Tabs (Flecainide Acetate) .... Take 1 Tab Two Times A Day  Allergies (verified): No Known Drug Allergies  Past History:  PMH, FH, and Social History reviewed and  updated.  Review of Systems       See history of present illness.  Vital Signs:  Patient profile:   55 year old female Height:      66 inches Pulse rate:   96 / minute BP sitting:   184 / 129  (right arm)  Vitals Entered By: Dreama Saa, CNA (July 14, 2009 11:16 AM)  Physical Exam  General:    Obese; mild respiratory distress NECK:  No jugular venous distention appreciated LUNGS:  Clear with decreased breath sounds at the bases. CARDIAC:  Distant first and second heart sounds. ABDOMEN:  Soft and nontender; no organomegaly. EXTREMITIES:  Marked increase in subcutaneous tissue;1+ pitting anklel edema.    PPM Specifications Following MD:  Sherryl Manges, MD     PPM Vendor:  [employer]     PPM Model Number:  [facility]     PPM Serial Number:  [id number] [facility] PPM DOI:  03/28/2007     PPM Implanting MD:  Sherryl Manges, MD  Lead 1    Location: RA     DOI: 03/28/2007     Model #: [id number]     Serial #: [id number]     Status: active Lead 2    Location: RV     DOI: 03/28/2007     Model #: [id number]     Serial #: [id number]     Status: active   Indications:  Intermittent CHB   Impression & Recommendations:  Problem #  1:  ACUTE ON CHRONIC DIASTOLIC HEART FAILURE (ICD-428.33) In the setting of severe obesity with known Obesity Hypoventilation Syndrome, her increasing respiratory distress is of concern.  She has a substantial weight gain, but it is unclear whether this represents fluid or continued increased body mass.  These issues cannot be conveniently evaluated in the office, and the patient will be transferred to the emergency department for chest x-ray, basic laboratory studies, measurement of BNP level, measurement of d-dimer and further evaluation as appropriate.  Her situation was discussed directly with the emergency department physician.

## 2010-06-20 NOTE — Miscellaneous (Signed)
Summary: [facility]  [facility]   Imported By: Roderic Ovens 08/30/2009 14:29:44  _____________________________________________________________________  External Attachment:    Type:   Image     Comment:   External Document

## 2010-06-20 NOTE — Letter (Signed)
Summary: Appointment - Missed  [facility] [place]  [address], [place] [address]   Phone: [phone]  Fax: [phone]     Sep 27, 2009 MRN: [id number]   [name] [address] [place], [place]  [address]   Dear Ms. Kamp,  Our records indicate you missed your appointment on         09/27/09               with Dr.       .      ROTHBART                              It is very important that we reach you to reschedule this appointment. We look forward to participating in your health care needs. Please contact us at the number listed above at your earliest convenience to reschedule this appointment.     Sincerely,    Glass blower/designer

## 2010-06-20 NOTE — Cardiovascular Report (Signed)
Summary: Certified Letter Signed - Other (No Response)  Certified Letter Signed - Other (No Response)   Imported By: Debby Freiberg 03/24/2010 12:05:50  _____________________________________________________________________  External Attachment:    Type:   Image     Comment:   External Document

## 2010-06-20 NOTE — Letter (Signed)
Summary: Appointment - Missed  [name] [place]  [address], [place] [address]   Phone: [phone]  Fax: [phone]     August 08, 2009 MRN: [id number]   [name] [address] [place], [place]  [address]   Dear [name],  Our records indicate you missed your appointment on 08-08-09 [name]      It is very important that we reach you to reschedule this appointment. We look forward to participating in your health care needs. Please contact us at the number listed above at your earliest convenience to reschedule this appointment.     Sincerely,    Glass blower/designer

## 2010-06-20 NOTE — Miscellaneous (Signed)
Summary: hospital labs bmp 04/19/2009  Clinical Lists Changes  Observations: Added new observation of CALCIUM: 8.8 mg/dL (16/02/9603 54:09) Added new observation of ALBUMIN: 3.4 g/dL (81/19/1478 29:56) Added new observation of PROTEIN, TOT: 7.1 g/dL (21/30/8657 84:69) Added new observation of SGPT (ALT): 14 units/L (04/19/2009 10:44) Added new observation of SGOT (AST): 18 units/L (04/19/2009 10:44) Added new observation of ALK PHOS: 70 units/L (04/19/2009 10:44) Added new observation of GFR AA: >60 mL/min/1.79m2 (04/19/2009 10:44) Added new observation of GFR: >60 mL/min (04/19/2009 10:44) Added new observation of CREATININE: 0.93 mg/dL (62/95/2841 32:44) Added new observation of BUN: 15 mg/dL (05/23/7251 66:44) Added new observation of BG RANDOM: 152 mg/dL (03/47/4259 56:38) Added new observation of CO2 PLSM/SER: 34 meq/L (04/19/2009 10:44) Added new observation of CL SERUM: 96 meq/L (04/19/2009 10:44) Added new observation of K SERUM: 3.7 meq/L (04/19/2009 10:44) Added new observation of NA: 139 meq/L (04/19/2009 10:44)

## 2010-06-20 NOTE — Letter (Signed)
Summary: Device-Delinquent Check  [facility], Main Office  [address]   [place], [place] [address]   Phone: [phone]  Fax: [phone]     December 19, 2009 MRN: [id number]   [name] [address] [place], [place]  [address]   Dear Ms. Eisenberger,  According to our records, you have not had your implanted device checked in the recommended period of time.  We are unable to determine appropriate device function without checking your device on a regular basis.  Please call our office to schedule an appointment as soon as possible.  If you are having your device checked by another physician, please call us so that we may update our records.  Thank you,   Architectural technologist [facility]

## 2010-06-20 NOTE — Procedures (Signed)
Summary: **PACER CHECK PER JodyHILL/TG  Medications Added SPIRONOLACTONE 25 MG TABS (SPIRONOLACTONE) one by mouth daily CARVEDILOL 25 MG TABS (CARVEDILOL) 1 by mouth daily      Allergies Added: NKDA  Current Medications (verified): 1)  Glipizide 5 Mg Tabs (Glipizide) .Marland Kitchen.. 1 By Mouth Once Daily 2)  Spironolactone 25 Mg Tabs (Spironolactone) .... One By Mouth Daily 3)  Albuterol Sulfate (2.5 Mg/7ml) 0.083% Nebu (Albuterol Sulfate) .Marland Kitchen.. 1 in Nebulizer Q 6 Hours As Needed Shortness of Breath 4)  Carvedilol 25 Mg Tabs (Carvedilol) .Marland Kitchen.. 1 By Mouth Daily 5)  Lisinopril 10 Mg Tabs (Lisinopril) .... Take 1 Tab Daily 6)  Aspirin 81 Mg Tbec (Aspirin) .... Take One Tablet By Mouth Daily 7)  Bp Cuff .... Take Bp Every Morning and Evening At The Same Time Daily 8)  Klor-Con M20 20 Meq Cr-Tabs (Potassium Chloride Crys Cr) .... Take 1 Tab Two Times A Day 9)  Simvastatin 20 Mg Tabs (Simvastatin) .... Take 1 Tab Daily 10)  Metoprolol Tartrate 100 Mg Tabs (Metoprolol Tartrate) .... Take 1 Tab Two Times A Day  Allergies (verified): No Known Drug Allergies   PPM Specifications Following MD:  [name] Manges, MD     PPM Vendor:  [employer]     PPM Model Number:  SEDROL     PPM Serial Number:  [id number] PPM DOI:  03/28/2007     PPM Implanting MD:  [name] Manges, MD  Lead 1    Location: RA     DOI: 03/28/2007     Model #: [id number]     Serial #: [id number]     Status: active Lead 2    Location: RV     DOI: 03/28/2007     Model #: 6213     Serial #: [id number]     Status: active  Magnet Response Rate:  BOL 85 ERI 65 ERI    Indications:  Intermittent CHB   PPM Follow Up Remote Check?  No Battery Voltage:  2.78 V     Battery Est. Longevity:  6.5 years     Pacer Dependent:  No       PPM Device Measurements Atrium  Amplitude: 5.6 mV, Impedance: 653 ohms, Threshold: 1.0 V at 0.4 msec Right Ventricle  Amplitude: 22.40 mV, Impedance: 910 ohms, Threshold: 0.5 V at 0.4 msec  Episodes MS Episodes:  1     Percent  Mode Switch:  <0.1%     Coumadin:  No Ventricular High Rate:  0     Atrial Pacing:  1.5%     Ventricular Pacing:  99.7%  Parameters Mode:  DDD     Lower Rate Limit:  60     Upper Rate Limit:  130 Paced AV Delay:  200     Sensed AV Delay:  200 Next Cardiology Appt Due:  12/19/2009 Tech Comments:  [name] is c/o some numbness in her left arm and generalized chest heaviness @ random times. She is scheduled to follow up with [name] for evaluation.    I reprogrammed her AV delays 200/200.  We will see her back in 6 months with [name]. Altha Harm, LPN  July 08, 2009 3:19 PM

## 2010-06-20 NOTE — Miscellaneous (Signed)
Summary: [facility] Written Confimation of Verbal Orders   [facility] Written Conf[facility] of Verbal Orders   Imported By: Roderic Ovens 11/07/2009 15:47:39  _____________________________________________________________________  External Attachment:    Type:   Image     Comment:   External Document

## 2010-06-20 NOTE — Letter (Signed)
Summary: Appointment - Missed  [facility] [place]  [address] [place] [address]   Phone: [phone]  Fax: [phone]     December 01, 2009 MRN: [id number]   Laquinta MACKEL [address] [place], [place]  [address]   Dear Ms. Secrist,  Our records indicate you missed your appointment on          12/01/09              with Dr.       .           ROTHBART                         It is very important that we reach you to reschedule this appointment. We look forward to participating in your health care needs. Please contact us at the number listed above at your earliest convenience to reschedule this appointment.     Sincerely,    Glass blower/designer

## 2010-06-20 NOTE — Miscellaneous (Signed)
Summary: [facility]   [facility] Orders   Imported By: Roderic Ovens 10/26/2009 16:38:09  _____________________________________________________________________  External Attachment:    Type:   Image     Comment:   External Document

## 2010-06-20 NOTE — Letter (Signed)
Summary: progress notes  progress notes   Imported By: Faythe Ghee 07/12/2009 14:55:17  _____________________________________________________________________  External Attachment:    Type:   Image     Comment:   External Document

## 2010-06-20 NOTE — Miscellaneous (Signed)
Summary: DR.GERALD HILLS LABS [facility],BMP 09/17/2009  Clinical Lists Changes  Observations: Added new observation of CALCIUM: 9.0 mg/dL (29/56/2130 8:65) Added new observation of CREATININE: 1.00 mg/dL (78/46/9629 5:28) Added new observation of BUN: 15 mg/dL (41/32/4401 0:27) Added new observation of BG RANDOM: 141 mg/dL (25/36/6440 3:47) Added new observation of CO2 PLSM/SER: 34 meq/L (09/17/2009 9:05) Added new observation of CL SERUM: 98 meq/L (09/17/2009 9:05) Added new observation of K SERUM: 3.7 meq/L (09/17/2009 9:05) Added new observation of NA: 143 meq/L (09/17/2009 9:05) Added new observation of PLATELETK/UL: 263 K/uL (09/17/2009 9:05) Added new observation of MCV: 87.3 fL (09/17/2009 9:05) Added new observation of HCT: 32.4 % (09/17/2009 9:05) Added new observation of HGB: 10.0 g/dL (42/59/5638 7:56) Added new observation of WBC COUNT: 9.3 10*3/microliter (09/17/2009 9:05)

## 2010-08-03 LAB — GLUCOSE, CAPILLARY
Glucose-Capillary: 121 mg/dL — ABNORMAL HIGH (ref 70–99)
Glucose-Capillary: 152 mg/dL — ABNORMAL HIGH (ref 70–99)
Glucose-Capillary: 156 mg/dL — ABNORMAL HIGH (ref 70–99)
Glucose-Capillary: 161 mg/dL — ABNORMAL HIGH (ref 70–99)
Glucose-Capillary: 161 mg/dL — ABNORMAL HIGH (ref 70–99)
Glucose-Capillary: 164 mg/dL — ABNORMAL HIGH (ref 70–99)
Glucose-Capillary: 165 mg/dL — ABNORMAL HIGH (ref 70–99)
Glucose-Capillary: 167 mg/dL — ABNORMAL HIGH (ref 70–99)
Glucose-Capillary: 175 mg/dL — ABNORMAL HIGH (ref 70–99)
Glucose-Capillary: 183 mg/dL — ABNORMAL HIGH (ref 70–99)
Glucose-Capillary: 199 mg/dL — ABNORMAL HIGH (ref 70–99)
Glucose-Capillary: 207 mg/dL — ABNORMAL HIGH (ref 70–99)
Glucose-Capillary: 215 mg/dL — ABNORMAL HIGH (ref 70–99)
Glucose-Capillary: 219 mg/dL — ABNORMAL HIGH (ref 70–99)
Glucose-Capillary: 230 mg/dL — ABNORMAL HIGH (ref 70–99)
Glucose-Capillary: 272 mg/dL — ABNORMAL HIGH (ref 70–99)
Glucose-Capillary: 299 mg/dL — ABNORMAL HIGH (ref 70–99)

## 2010-08-03 LAB — DIFFERENTIAL
Basophils Absolute: 0 10*3/uL (ref 0.0–0.1)
Basophils Absolute: 0 10*3/uL (ref 0.0–0.1)
Basophils Absolute: 0 10*3/uL (ref 0.0–0.1)
Basophils Absolute: 0 10*3/uL (ref 0.0–0.1)
Basophils Relative: 0 % (ref 0–1)
Basophils Relative: 0 % (ref 0–1)
Basophils Relative: 0 % (ref 0–1)
Eosinophils Absolute: 0 10*3/uL (ref 0.0–0.7)
Eosinophils Absolute: 0 10*3/uL (ref 0.0–0.7)
Eosinophils Absolute: 0.1 10*3/uL (ref 0.0–0.7)
Eosinophils Absolute: 0.1 10*3/uL (ref 0.0–0.7)
Eosinophils Absolute: 0.2 10*3/uL (ref 0.0–0.7)
Eosinophils Relative: 0 % (ref 0–5)
Eosinophils Relative: 0 % (ref 0–5)
Eosinophils Relative: 0 % (ref 0–5)
Eosinophils Relative: 0 % (ref 0–5)
Eosinophils Relative: 1 % (ref 0–5)
Eosinophils Relative: 1 % (ref 0–5)
Eosinophils Relative: 1 % (ref 0–5)
Lymphocytes Relative: 4 % — ABNORMAL LOW (ref 12–46)
Lymphocytes Relative: 5 % — ABNORMAL LOW (ref 12–46)
Lymphocytes Relative: 5 % — ABNORMAL LOW (ref 12–46)
Lymphocytes Relative: 7 % — ABNORMAL LOW (ref 12–46)
Lymphocytes Relative: 9 % — ABNORMAL LOW (ref 12–46)
Lymphs Abs: 0.5 10*3/uL — ABNORMAL LOW (ref 0.7–4.0)
Lymphs Abs: 0.5 10*3/uL — ABNORMAL LOW (ref 0.7–4.0)
Lymphs Abs: 1.2 10*3/uL (ref 0.7–4.0)
Lymphs Abs: 1.2 10*3/uL (ref 0.7–4.0)
Monocytes Absolute: 0.1 10*3/uL (ref 0.1–1.0)
Monocytes Absolute: 0.5 10*3/uL (ref 0.1–1.0)
Monocytes Absolute: 0.8 10*3/uL (ref 0.1–1.0)
Monocytes Absolute: 1.1 10*3/uL — ABNORMAL HIGH (ref 0.1–1.0)
Monocytes Absolute: 1.2 10*3/uL — ABNORMAL HIGH (ref 0.1–1.0)
Monocytes Relative: 1 % — ABNORMAL LOW (ref 3–12)
Monocytes Relative: 10 % (ref 3–12)
Monocytes Relative: 5 % (ref 3–12)
Monocytes Relative: 7 % (ref 3–12)
Neutro Abs: 10.4 10*3/uL — ABNORMAL HIGH (ref 1.7–7.7)
Neutrophils Relative %: 84 % — ABNORMAL HIGH (ref 43–77)
Neutrophils Relative %: 90 % — ABNORMAL HIGH (ref 43–77)

## 2010-08-03 LAB — BLOOD GAS, ARTERIAL
Acid-Base Excess: 13.5 mmol/L — ABNORMAL HIGH (ref 0.0–2.0)
Acid-Base Excess: 14.3 mmol/L — ABNORMAL HIGH (ref 0.0–2.0)
Bicarbonate: 39.9 mEq/L — ABNORMAL HIGH (ref 20.0–24.0)
FIO2: 40 %
FIO2: 45 %
MECHVT: 400 mL
MECHVT: 400 mL
MECHVT: 400 mL
MECHVT: 500 mL
MECHVT: 500 mL
O2 Content: 4 L/min
O2 Saturation: 96.7 %
O2 Saturation: 97.2 %
O2 Saturation: 97.7 %
O2 Saturation: 98 %
PEEP: 5 cmH2O
PEEP: 5 cmH2O
PEEP: 5 cmH2O
PEEP: 5 cmH2O
Patient temperature: 37
Patient temperature: 37
Patient temperature: 37
Patient temperature: 37
Patient temperature: 37
Pressure support: 5 cmH2O
RATE: 12 resp/min
RATE: 12 resp/min
TCO2: 35.1 mmol/L (ref 0–100)
TCO2: 35.4 mmol/L (ref 0–100)
TCO2: 37 mmol/L (ref 0–100)
TCO2: 37.1 mmol/L (ref 0–100)
TCO2: 38.4 mmol/L (ref 0–100)
pCO2 arterial: 58.8 mmHg (ref 35.0–45.0)
pCO2 arterial: 69.4 mmHg (ref 35.0–45.0)
pCO2 arterial: 72.5 mmHg (ref 35.0–45.0)
pCO2 arterial: 78.4 mmHg (ref 35.0–45.0)
pH, Arterial: 7.369 (ref 7.350–7.400)
pH, Arterial: 7.426 — ABNORMAL HIGH (ref 7.350–7.400)
pH, Arterial: 7.427 — ABNORMAL HIGH (ref 7.350–7.400)
pH, Arterial: 7.445 — ABNORMAL HIGH (ref 7.350–7.400)
pH, Arterial: 7.448 — ABNORMAL HIGH (ref 7.350–7.400)
pO2, Arterial: 105 mmHg — ABNORMAL HIGH (ref 80.0–100.0)
pO2, Arterial: 63.6 mmHg — ABNORMAL LOW (ref 80.0–100.0)
pO2, Arterial: 84.6 mmHg (ref 80.0–100.0)
pO2, Arterial: 85.3 mmHg (ref 80.0–100.0)

## 2010-08-03 LAB — BASIC METABOLIC PANEL
BUN: 15 mg/dL (ref 6–23)
BUN: 16 mg/dL (ref 6–23)
BUN: 33 mg/dL — ABNORMAL HIGH (ref 6–23)
BUN: 33 mg/dL — ABNORMAL HIGH (ref 6–23)
BUN: 35 mg/dL — ABNORMAL HIGH (ref 6–23)
BUN: 36 mg/dL — ABNORMAL HIGH (ref 6–23)
BUN: 37 mg/dL — ABNORMAL HIGH (ref 6–23)
CO2: 38 mEq/L — ABNORMAL HIGH (ref 19–32)
CO2: 39 mEq/L — ABNORMAL HIGH (ref 19–32)
Calcium: 8.2 mg/dL — ABNORMAL LOW (ref 8.4–10.5)
Calcium: 8.8 mg/dL (ref 8.4–10.5)
Calcium: 8.9 mg/dL (ref 8.4–10.5)
Calcium: 9 mg/dL (ref 8.4–10.5)
Chloride: 91 mEq/L — ABNORMAL LOW (ref 96–112)
Chloride: 91 mEq/L — ABNORMAL LOW (ref 96–112)
Chloride: 92 mEq/L — ABNORMAL LOW (ref 96–112)
Chloride: 93 mEq/L — ABNORMAL LOW (ref 96–112)
Chloride: 93 mEq/L — ABNORMAL LOW (ref 96–112)
Creatinine, Ser: 0.98 mg/dL (ref 0.4–1.2)
Creatinine, Ser: 1 mg/dL (ref 0.4–1.2)
Creatinine, Ser: 1.04 mg/dL (ref 0.4–1.2)
Creatinine, Ser: 1.06 mg/dL (ref 0.4–1.2)
Creatinine, Ser: 1.33 mg/dL — ABNORMAL HIGH (ref 0.4–1.2)
GFR calc Af Amer: >60 mL/min (ref >60)
GFR calc Af Amer: >60 mL/min (ref >60)
GFR calc non Af Amer: 42 mL/min — ABNORMAL LOW (ref >60)
GFR calc non Af Amer: 54 mL/min — ABNORMAL LOW (ref >60)
Glucose, Bld: 150 mg/dL — ABNORMAL HIGH (ref 70–99)
Glucose, Bld: 193 mg/dL — ABNORMAL HIGH (ref 70–99)
Glucose, Bld: 201 mg/dL — ABNORMAL HIGH (ref 70–99)
Potassium: 3.9 mEq/L (ref 3.5–5.1)
Potassium: 4.6 mEq/L (ref 3.5–5.1)
Potassium: 5 mEq/L (ref 3.5–5.1)
Potassium: 5.2 mEq/L — ABNORMAL HIGH (ref 3.5–5.1)
Sodium: 140 mEq/L (ref 135–145)

## 2010-08-03 LAB — CBC
HCT: 27.8 % — ABNORMAL LOW (ref 36.0–46.0)
HCT: 30.2 % — ABNORMAL LOW (ref 36.0–46.0)
HCT: 31.7 % — ABNORMAL LOW (ref 36.0–46.0)
Hemoglobin: 8.9 g/dL — ABNORMAL LOW (ref 12.0–15.0)
Hemoglobin: 9.7 g/dL — ABNORMAL LOW (ref 12.0–15.0)
MCH: 28.3 pg (ref 26.0–34.0)
MCH: 28.4 pg (ref 26.0–34.0)
MCH: 28.5 pg (ref 26.0–34.0)
MCHC: 31.8 g/dL (ref 30.0–36.0)
MCHC: 32.1 g/dL (ref 30.0–36.0)
MCHC: 32.1 g/dL (ref 30.0–36.0)
MCHC: 32.2 g/dL (ref 30.0–36.0)
MCV: 87.2 fL (ref 78.0–100.0)
MCV: 87.3 fL (ref 78.0–100.0)
MCV: 88.1 fL (ref 78.0–100.0)
MCV: 88.2 fL (ref 78.0–100.0)
MCV: 88.3 fL (ref 78.0–100.0)
MCV: 88.8 fL (ref 78.0–100.0)
Platelets: 217 10*3/uL (ref 150–400)
Platelets: 238 10*3/uL (ref 150–400)
Platelets: 240 10*3/uL (ref 150–400)
Platelets: 240 10*3/uL (ref 150–400)
Platelets: 240 10*3/uL (ref 150–400)
Platelets: 248 10*3/uL (ref 150–400)
Platelets: 251 10*3/uL (ref 150–400)
RBC: 3.16 MIL/uL — ABNORMAL LOW (ref 3.87–5.11)
RBC: 3.18 MIL/uL — ABNORMAL LOW (ref 3.87–5.11)
RBC: 3.58 MIL/uL — ABNORMAL LOW (ref 3.87–5.11)
RBC: 3.59 MIL/uL — ABNORMAL LOW (ref 3.87–5.11)
RDW: 15.7 % — ABNORMAL HIGH (ref 11.5–15.5)
RDW: 15.8 % — ABNORMAL HIGH (ref 11.5–15.5)
RDW: 15.9 % — ABNORMAL HIGH (ref 11.5–15.5)
RDW: 16 % — ABNORMAL HIGH (ref 11.5–15.5)
RDW: 16 % — ABNORMAL HIGH (ref 11.5–15.5)
RDW: 16.2 % — ABNORMAL HIGH (ref 11.5–15.5)
WBC: 10 10*3/uL (ref 4.0–10.5)
WBC: 10.2 10*3/uL (ref 4.0–10.5)
WBC: 11.3 10*3/uL — ABNORMAL HIGH (ref 4.0–10.5)
WBC: 11.7 10*3/uL — ABNORMAL HIGH (ref 4.0–10.5)
WBC: 11.8 10*3/uL — ABNORMAL HIGH (ref 4.0–10.5)
WBC: 12.5 10*3/uL — ABNORMAL HIGH (ref 4.0–10.5)
WBC: 13 10*3/uL — ABNORMAL HIGH (ref 4.0–10.5)
WBC: 9.6 10*3/uL (ref 4.0–10.5)

## 2010-08-03 LAB — COMPREHENSIVE METABOLIC PANEL
ALT: 23 U/L (ref 0–35)
ALT: 26 U/L (ref 0–35)
AST: 17 U/L (ref 0–37)
AST: 21 U/L (ref 0–37)
Albumin: 3.5 g/dL (ref 3.5–5.2)
Albumin: 3.6 g/dL (ref 3.5–5.2)
Alkaline Phosphatase: 83 U/L (ref 39–117)
BUN: 22 mg/dL (ref 6–23)
Calcium: 8.9 mg/dL (ref 8.4–10.5)
Chloride: 93 mEq/L — ABNORMAL LOW (ref 96–112)
GFR calc Af Amer: 45 mL/min — ABNORMAL LOW (ref >60)
Potassium: 3.8 mEq/L (ref 3.5–5.1)
Sodium: 140 mEq/L (ref 135–145)
Sodium: 143 mEq/L (ref 135–145)
Total Bilirubin: 0.5 mg/dL (ref 0.3–1.2)
Total Protein: 6.8 g/dL (ref 6.0–8.3)
Total Protein: 7 g/dL (ref 6.0–8.3)

## 2010-08-03 LAB — CARDIAC PANEL(CRET KIN+CKTOT+MB+TROPI)
CK, MB: 0.6 ng/mL (ref 0.3–4.0)
CK, MB: 0.8 ng/mL (ref 0.3–4.0)
Relative Index: INVALID (ref 0.0–2.5)
Total CK: 69 U/L (ref 7–177)

## 2010-08-03 LAB — PROTIME-INR: Prothrombin Time: 13.9 seconds (ref 11.6–15.2)

## 2010-08-03 LAB — FOLATE: Folate: 6.2 ng/mL

## 2010-08-03 LAB — IRON AND TIBC: Saturation Ratios: 14 % — ABNORMAL LOW (ref 20–55)

## 2010-08-03 LAB — MRSA PCR SCREENING: MRSA by PCR: NEGATIVE

## 2010-08-03 LAB — FERRITIN: Ferritin: 113 ng/mL (ref 10–291)

## 2010-08-03 LAB — TRIGLYCERIDES: Triglycerides: 129 mg/dL (ref <150)

## 2010-08-03 LAB — POCT CARDIAC MARKERS
CKMB, poc: <1 ng/mL — ABNORMAL LOW (ref 1.0–8.0)
Myoglobin, poc: 89.5 ng/mL (ref 12–200)

## 2010-08-03 LAB — RETICULOCYTES: Retic Ct Pct: 2.5 % (ref 0.4–3.1)

## 2010-08-07 LAB — DIFFERENTIAL
Basophils Absolute: 0 10*3/uL (ref 0.0–0.1)
Lymphocytes Relative: 14 % (ref 12–46)
Neutro Abs: 6.8 10*3/uL (ref 1.7–7.7)

## 2010-08-07 LAB — CBC
Platelets: 245 10*3/uL (ref 150–400)
RDW: 17.7 % — ABNORMAL HIGH (ref 11.5–15.5)

## 2010-08-07 LAB — BRAIN NATRIURETIC PEPTIDE: Pro B Natriuretic peptide (BNP): 157 pg/mL — ABNORMAL HIGH (ref 0.0–100.0)

## 2010-08-07 LAB — BASIC METABOLIC PANEL
BUN: 12 mg/dL (ref 6–23)
Calcium: 9.4 mg/dL (ref 8.4–10.5)
GFR calc non Af Amer: 59 mL/min — ABNORMAL LOW (ref >60)
Glucose, Bld: 116 mg/dL — ABNORMAL HIGH (ref 70–99)
Sodium: 140 mEq/L (ref 135–145)

## 2010-08-07 LAB — POCT CARDIAC MARKERS
CKMB, poc: <1 ng/mL — ABNORMAL LOW (ref 1.0–8.0)
Myoglobin, poc: 68.2 ng/mL (ref 12–200)
Troponin i, poc: <0.05 ng/mL (ref 0.00–0.09)

## 2010-08-09 LAB — BLOOD GAS, ARTERIAL
Acid-Base Excess: 10.7 mmol/L — ABNORMAL HIGH (ref 0.0–2.0)
Acid-Base Excess: 13.1 mmol/L — ABNORMAL HIGH (ref 0.0–2.0)
Bicarbonate: 37 mEq/L — ABNORMAL HIGH (ref 20.0–24.0)
Bicarbonate: 40 mEq/L — ABNORMAL HIGH (ref 20.0–24.0)
Bicarbonate: 40.7 mEq/L — ABNORMAL HIGH (ref 20.0–24.0)
Delivery systems: POSITIVE
Expiratory PAP: 8
FIO2: 1 %
FIO2: 100 %
MECHVT: 450 mL
O2 Content: 10 L/min
O2 Saturation: 95.3 %
O2 Saturation: 96.7 %
O2 Saturation: 99.7 %
PEEP: 5 cmH2O
PEEP: 5 cmH2O
Patient temperature: 37
Patient temperature: 37
RATE: 20 resp/min
RATE: 20 resp/min
TCO2: 33.6 mmol/L (ref 0–100)
TCO2: 39.8 mmol/L (ref 0–100)
pCO2 arterial: 143 mmHg (ref 35.0–45.0)
pCO2 arterial: 44.6 mmHg (ref 35.0–45.0)
pO2, Arterial: 97.7 mmHg (ref 80.0–100.0)
pO2, Arterial: 98 mmHg (ref 80.0–100.0)

## 2010-08-09 LAB — DIFFERENTIAL
Basophils Absolute: 0 10*3/uL (ref 0.0–0.1)
Basophils Absolute: 0 10*3/uL (ref 0.0–0.1)
Basophils Absolute: 0 10*3/uL (ref 0.0–0.1)
Basophils Relative: 0 % (ref 0–1)
Basophils Relative: 0 % (ref 0–1)
Basophils Relative: 0 % (ref 0–1)
Eosinophils Absolute: 0 10*3/uL (ref 0.0–0.7)
Eosinophils Absolute: 0.1 10*3/uL (ref 0.0–0.7)
Eosinophils Absolute: 0.2 10*3/uL (ref 0.0–0.7)
Eosinophils Absolute: 0.2 10*3/uL (ref 0.0–0.7)
Eosinophils Relative: 0 % (ref 0–5)
Eosinophils Relative: 1 % (ref 0–5)
Lymphocytes Relative: 10 % — ABNORMAL LOW (ref 12–46)
Lymphocytes Relative: 9 % — ABNORMAL LOW (ref 12–46)
Lymphs Abs: 0.8 10*3/uL (ref 0.7–4.0)
Lymphs Abs: 1.1 10*3/uL (ref 0.7–4.0)
Monocytes Absolute: 0.6 10*3/uL (ref 0.1–1.0)
Monocytes Absolute: 0.7 10*3/uL (ref 0.1–1.0)
Monocytes Relative: 6 % (ref 3–12)
Monocytes Relative: 7 % (ref 3–12)
Monocytes Relative: 8 % (ref 3–12)
Neutro Abs: 6.2 10*3/uL (ref 1.7–7.7)
Neutro Abs: 6.9 10*3/uL (ref 1.7–7.7)
Neutro Abs: 7.1 10*3/uL (ref 1.7–7.7)
Neutro Abs: 7.2 10*3/uL (ref 1.7–7.7)
Neutrophils Relative %: 78 % — ABNORMAL HIGH (ref 43–77)
Neutrophils Relative %: 82 % — ABNORMAL HIGH (ref 43–77)

## 2010-08-09 LAB — LIPID PANEL
HDL: 38 mg/dL — ABNORMAL LOW (ref >39)
LDL Cholesterol: 136 mg/dL — ABNORMAL HIGH (ref 0–99)
Triglycerides: 135 mg/dL (ref <150)
VLDL: 27 mg/dL (ref 0–40)

## 2010-08-09 LAB — COMPREHENSIVE METABOLIC PANEL
ALT: 12 U/L (ref 0–35)
AST: 12 U/L (ref 0–37)
AST: 18 U/L (ref 0–37)
Albumin: 3.1 g/dL — ABNORMAL LOW (ref 3.5–5.2)
Alkaline Phosphatase: 67 U/L (ref 39–117)
Alkaline Phosphatase: 69 U/L (ref 39–117)
BUN: 11 mg/dL (ref 6–23)
BUN: 14 mg/dL (ref 6–23)
BUN: 18 mg/dL (ref 6–23)
CO2: 34 mEq/L — ABNORMAL HIGH (ref 19–32)
CO2: 38 mEq/L — ABNORMAL HIGH (ref 19–32)
Calcium: 8.5 mg/dL (ref 8.4–10.5)
Chloride: 94 mEq/L — ABNORMAL LOW (ref 96–112)
Chloride: 94 mEq/L — ABNORMAL LOW (ref 96–112)
Chloride: 95 mEq/L — ABNORMAL LOW (ref 96–112)
Creatinine, Ser: 1.63 mg/dL — ABNORMAL HIGH (ref 0.4–1.2)
GFR calc Af Amer: 40 mL/min — ABNORMAL LOW (ref >60)
GFR calc non Af Amer: 33 mL/min — ABNORMAL LOW (ref >60)
GFR calc non Af Amer: >60 mL/min (ref >60)
Glucose, Bld: 127 mg/dL — ABNORMAL HIGH (ref 70–99)
Glucose, Bld: 80 mg/dL (ref 70–99)
Glucose, Bld: 99 mg/dL (ref 70–99)
Potassium: 3.8 mEq/L (ref 3.5–5.1)
Potassium: 5.1 mEq/L (ref 3.5–5.1)
Sodium: 138 mEq/L (ref 135–145)
Total Bilirubin: 0.3 mg/dL (ref 0.3–1.2)
Total Bilirubin: 0.3 mg/dL (ref 0.3–1.2)
Total Bilirubin: 0.5 mg/dL (ref 0.3–1.2)
Total Protein: 6.7 g/dL (ref 6.0–8.3)
Total Protein: 7 g/dL (ref 6.0–8.3)

## 2010-08-09 LAB — GLUCOSE, CAPILLARY
Glucose-Capillary: 105 mg/dL — ABNORMAL HIGH (ref 70–99)
Glucose-Capillary: 106 mg/dL — ABNORMAL HIGH (ref 70–99)
Glucose-Capillary: 123 mg/dL — ABNORMAL HIGH (ref 70–99)
Glucose-Capillary: 136 mg/dL — ABNORMAL HIGH (ref 70–99)
Glucose-Capillary: 141 mg/dL — ABNORMAL HIGH (ref 70–99)
Glucose-Capillary: 143 mg/dL — ABNORMAL HIGH (ref 70–99)
Glucose-Capillary: 182 mg/dL — ABNORMAL HIGH (ref 70–99)
Glucose-Capillary: 92 mg/dL (ref 70–99)

## 2010-08-09 LAB — CBC
HCT: 29.8 % — ABNORMAL LOW (ref 36.0–46.0)
HCT: 32.2 % — ABNORMAL LOW (ref 36.0–46.0)
HCT: 32.4 % — ABNORMAL LOW (ref 36.0–46.0)
Hemoglobin: 10.4 g/dL — ABNORMAL LOW (ref 12.0–15.0)
Hemoglobin: 10.9 g/dL — ABNORMAL LOW (ref 12.0–15.0)
Hemoglobin: 9.7 g/dL — ABNORMAL LOW (ref 12.0–15.0)
MCHC: 31.8 g/dL (ref 30.0–36.0)
MCV: 88.1 fL (ref 78.0–100.0)
MCV: 89.1 fL (ref 78.0–100.0)
Platelets: 258 10*3/uL (ref 150–400)
RBC: 3.38 MIL/uL — ABNORMAL LOW (ref 3.87–5.11)
RBC: 3.7 MIL/uL — ABNORMAL LOW (ref 3.87–5.11)
RBC: 3.86 MIL/uL — ABNORMAL LOW (ref 3.87–5.11)
RBC: 3.93 MIL/uL (ref 3.87–5.11)
RDW: 16.7 % — ABNORMAL HIGH (ref 11.5–15.5)
RDW: 17.1 % — ABNORMAL HIGH (ref 11.5–15.5)
WBC: 8.1 10*3/uL (ref 4.0–10.5)
WBC: 8.6 10*3/uL (ref 4.0–10.5)
WBC: 8.7 10*3/uL (ref 4.0–10.5)
WBC: 9 10*3/uL (ref 4.0–10.5)
WBC: 9.3 10*3/uL (ref 4.0–10.5)

## 2010-08-09 LAB — POCT CARDIAC MARKERS
Myoglobin, poc: 55.5 ng/mL (ref 12–200)
Troponin i, poc: <0.05 ng/mL (ref 0.00–0.09)

## 2010-08-09 LAB — CULTURE, BLOOD (ROUTINE X 2)
Culture: NO GROWTH
Report Status: 3012011
Report Status: 3012011

## 2010-08-09 LAB — CARDIAC PANEL(CRET KIN+CKTOT+MB+TROPI)
Relative Index: 0.2 (ref 0.0–2.5)
Relative Index: 0.9 (ref 0.0–2.5)
Relative Index: 0.9 (ref 0.0–2.5)
Relative Index: 1 (ref 0.0–2.5)
Troponin I: 0.03 ng/mL (ref 0.00–0.06)
Troponin I: 0.03 ng/mL (ref 0.00–0.06)

## 2010-08-09 LAB — PROTIME-INR
INR: 1.08 (ref 0.00–1.49)
INR: 1.09 (ref 0.00–1.49)
Prothrombin Time: 14.3 seconds (ref 11.6–15.2)

## 2010-08-09 LAB — BRAIN NATRIURETIC PEPTIDE
Pro B Natriuretic peptide (BNP): 58.3 pg/mL (ref 0.0–100.0)
Pro B Natriuretic peptide (BNP): <30 pg/mL (ref 0.0–100.0)

## 2010-08-09 LAB — MRSA PCR SCREENING: MRSA by PCR: NEGATIVE

## 2010-08-13 LAB — DIFFERENTIAL
Basophils Absolute: 0 10*3/uL (ref 0.0–0.1)
Basophils Absolute: 0 10*3/uL (ref 0.0–0.1)
Basophils Absolute: 0 10*3/uL (ref 0.0–0.1)
Basophils Relative: 0 % (ref 0–1)
Basophils Relative: 0 % (ref 0–1)
Basophils Relative: 0 % (ref 0–1)
Eosinophils Absolute: 0 10*3/uL (ref 0.0–0.7)
Eosinophils Absolute: 0 10*3/uL (ref 0.0–0.7)
Eosinophils Relative: 0 % (ref 0–5)
Eosinophils Relative: 0 % (ref 0–5)
Lymphocytes Relative: 2 % — ABNORMAL LOW (ref 12–46)
Lymphocytes Relative: 4 % — ABNORMAL LOW (ref 12–46)
Lymphs Abs: 0.5 10*3/uL — ABNORMAL LOW (ref 0.7–4.0)
Monocytes Absolute: 0.1 10*3/uL (ref 0.1–1.0)
Monocytes Absolute: 0.5 10*3/uL (ref 0.1–1.0)
Monocytes Relative: 1 % — ABNORMAL LOW (ref 3–12)
Monocytes Relative: 4 % (ref 3–12)
Monocytes Relative: 7 % (ref 3–12)
Neutro Abs: 10.3 10*3/uL — ABNORMAL HIGH (ref 1.7–7.7)
Neutro Abs: 13.1 10*3/uL — ABNORMAL HIGH (ref 1.7–7.7)
Neutrophils Relative %: 81 % — ABNORMAL HIGH (ref 43–77)
Neutrophils Relative %: 90 % — ABNORMAL HIGH (ref 43–77)
Neutrophils Relative %: 94 % — ABNORMAL HIGH (ref 43–77)
Neutrophils Relative %: 95 % — ABNORMAL HIGH (ref 43–77)

## 2010-08-13 LAB — COMPREHENSIVE METABOLIC PANEL
Albumin: 3 g/dL — ABNORMAL LOW (ref 3.5–5.2)
Alkaline Phosphatase: 50 U/L (ref 39–117)
Alkaline Phosphatase: 64 U/L (ref 39–117)
BUN: 35 mg/dL — ABNORMAL HIGH (ref 6–23)
BUN: 36 mg/dL — ABNORMAL HIGH (ref 6–23)
CO2: 39 mEq/L — ABNORMAL HIGH (ref 19–32)
Calcium: 9.4 mg/dL (ref 8.4–10.5)
Chloride: 93 mEq/L — ABNORMAL LOW (ref 96–112)
Creatinine, Ser: 1.38 mg/dL — ABNORMAL HIGH (ref 0.4–1.2)
GFR calc non Af Amer: 40 mL/min — ABNORMAL LOW (ref >60)
Potassium: 3.7 mEq/L (ref 3.5–5.1)
Potassium: 3.8 mEq/L (ref 3.5–5.1)
Total Bilirubin: 0.5 mg/dL (ref 0.3–1.2)
Total Protein: 7.1 g/dL (ref 6.0–8.3)

## 2010-08-13 LAB — BASIC METABOLIC PANEL
BUN: 38 mg/dL — ABNORMAL HIGH (ref 6–23)
BUN: 39 mg/dL — ABNORMAL HIGH (ref 6–23)
CO2: 38 mEq/L — ABNORMAL HIGH (ref 19–32)
CO2: 40 mEq/L — ABNORMAL HIGH (ref 19–32)
CO2: 41 mEq/L (ref 19–32)
Calcium: 8.4 mg/dL (ref 8.4–10.5)
Calcium: 8.4 mg/dL (ref 8.4–10.5)
Calcium: 8.9 mg/dL (ref 8.4–10.5)
Chloride: 92 mEq/L — ABNORMAL LOW (ref 96–112)
Creatinine, Ser: 0.97 mg/dL (ref 0.4–1.2)
Creatinine, Ser: 1.07 mg/dL (ref 0.4–1.2)
Creatinine, Ser: 1.21 mg/dL — ABNORMAL HIGH (ref 0.4–1.2)
Creatinine, Ser: 1.46 mg/dL — ABNORMAL HIGH (ref 0.4–1.2)
GFR calc Af Amer: >60 mL/min (ref >60)
GFR calc Af Amer: >60 mL/min (ref >60)
GFR calc Af Amer: >60 mL/min (ref >60)
GFR calc non Af Amer: 37 mL/min — ABNORMAL LOW (ref >60)
GFR calc non Af Amer: 46 mL/min — ABNORMAL LOW (ref >60)
GFR calc non Af Amer: 53 mL/min — ABNORMAL LOW (ref >60)
Glucose, Bld: 225 mg/dL — ABNORMAL HIGH (ref 70–99)
Glucose, Bld: 237 mg/dL — ABNORMAL HIGH (ref 70–99)
Potassium: 3.6 mEq/L (ref 3.5–5.1)
Sodium: 139 mEq/L (ref 135–145)
Sodium: 139 mEq/L (ref 135–145)

## 2010-08-13 LAB — BLOOD GAS, ARTERIAL
Acid-Base Excess: 12 mmol/L — ABNORMAL HIGH (ref 0.0–2.0)
Acid-Base Excess: 13.4 mmol/L — ABNORMAL HIGH (ref 0.0–2.0)
Acid-Base Excess: 9.3 mmol/L — ABNORMAL HIGH (ref 0.0–2.0)
Bicarbonate: 37.1 mEq/L — ABNORMAL HIGH (ref 20.0–24.0)
Bicarbonate: 37.5 mEq/L — ABNORMAL HIGH (ref 20.0–24.0)
O2 Content: 3 L/min
O2 Content: 3 L/min
O2 Saturation: 91.6 %
O2 Saturation: 93 %
PEEP: 5 cmH2O
Patient temperature: 37
Patient temperature: 37
TCO2: 34.3 mmol/L (ref 0–100)
pCO2 arterial: 56 mmHg — ABNORMAL HIGH (ref 35.0–45.0)
pCO2 arterial: 58.7 mmHg (ref 35.0–45.0)
pH, Arterial: 7.404 — ABNORMAL HIGH (ref 7.350–7.400)
pH, Arterial: 7.475 — ABNORMAL HIGH (ref 7.350–7.400)
pO2, Arterial: 64.4 mmHg — ABNORMAL LOW (ref 80.0–100.0)
pO2, Arterial: 70.2 mmHg — ABNORMAL LOW (ref 80.0–100.0)
pO2, Arterial: 82.9 mmHg (ref 80.0–100.0)
pO2, Arterial: 83.3 mmHg (ref 80.0–100.0)

## 2010-08-13 LAB — GLUCOSE, CAPILLARY
Glucose-Capillary: 141 mg/dL — ABNORMAL HIGH (ref 70–99)
Glucose-Capillary: 182 mg/dL — ABNORMAL HIGH (ref 70–99)
Glucose-Capillary: 186 mg/dL — ABNORMAL HIGH (ref 70–99)
Glucose-Capillary: 195 mg/dL — ABNORMAL HIGH (ref 70–99)
Glucose-Capillary: 200 mg/dL — ABNORMAL HIGH (ref 70–99)
Glucose-Capillary: 205 mg/dL — ABNORMAL HIGH (ref 70–99)
Glucose-Capillary: 216 mg/dL — ABNORMAL HIGH (ref 70–99)
Glucose-Capillary: 223 mg/dL — ABNORMAL HIGH (ref 70–99)
Glucose-Capillary: 223 mg/dL — ABNORMAL HIGH (ref 70–99)
Glucose-Capillary: 235 mg/dL — ABNORMAL HIGH (ref 70–99)
Glucose-Capillary: 238 mg/dL — ABNORMAL HIGH (ref 70–99)
Glucose-Capillary: 253 mg/dL — ABNORMAL HIGH (ref 70–99)
Glucose-Capillary: 259 mg/dL — ABNORMAL HIGH (ref 70–99)

## 2010-08-13 LAB — CBC
HCT: 31.5 % — ABNORMAL LOW (ref 36.0–46.0)
HCT: 31.8 % — ABNORMAL LOW (ref 36.0–46.0)
HCT: 31.8 % — ABNORMAL LOW (ref 36.0–46.0)
HCT: 33.1 % — ABNORMAL LOW (ref 36.0–46.0)
Hemoglobin: 10.2 g/dL — ABNORMAL LOW (ref 12.0–15.0)
Hemoglobin: 10.3 g/dL — ABNORMAL LOW (ref 12.0–15.0)
Hemoglobin: 10.4 g/dL — ABNORMAL LOW (ref 12.0–15.0)
Hemoglobin: 10.9 g/dL — ABNORMAL LOW (ref 12.0–15.0)
MCHC: 32.5 g/dL (ref 30.0–36.0)
MCHC: 32.6 g/dL (ref 30.0–36.0)
MCHC: 32.9 g/dL (ref 30.0–36.0)
MCHC: 33 g/dL (ref 30.0–36.0)
MCV: 87.4 fL (ref 78.0–100.0)
MCV: 88.7 fL (ref 78.0–100.0)
MCV: 89.2 fL (ref 78.0–100.0)
MCV: 89.5 fL (ref 78.0–100.0)
Platelets: 250 10*3/uL (ref 150–400)
RBC: 3.52 MIL/uL — ABNORMAL LOW (ref 3.87–5.11)
RBC: 3.55 MIL/uL — ABNORMAL LOW (ref 3.87–5.11)
RBC: 3.61 MIL/uL — ABNORMAL LOW (ref 3.87–5.11)
RBC: 3.78 MIL/uL — ABNORMAL LOW (ref 3.87–5.11)
RDW: 16.6 % — ABNORMAL HIGH (ref 11.5–15.5)
RDW: 17 % — ABNORMAL HIGH (ref 11.5–15.5)
RDW: 17.1 % — ABNORMAL HIGH (ref 11.5–15.5)
WBC: 12.7 10*3/uL — ABNORMAL HIGH (ref 4.0–10.5)

## 2010-08-13 LAB — BRAIN NATRIURETIC PEPTIDE
Pro B Natriuretic peptide (BNP): 247 pg/mL — ABNORMAL HIGH (ref 0.0–100.0)
Pro B Natriuretic peptide (BNP): <30 pg/mL (ref 0.0–100.0)

## 2010-08-22 LAB — CBC
Hemoglobin: 10.7 g/dL — ABNORMAL LOW (ref 12.0–15.0)
MCHC: 33.3 g/dL (ref 30.0–36.0)
MCV: 88.1 fL (ref 78.0–100.0)
RBC: 3.66 MIL/uL — ABNORMAL LOW (ref 3.87–5.11)

## 2010-08-22 LAB — GLUCOSE, CAPILLARY
Glucose-Capillary: 107 mg/dL — ABNORMAL HIGH (ref 70–99)
Glucose-Capillary: 150 mg/dL — ABNORMAL HIGH (ref 70–99)
Glucose-Capillary: 89 mg/dL (ref 70–99)
Glucose-Capillary: 92 mg/dL (ref 70–99)

## 2010-08-22 LAB — HEMOCCULT GUIAC POC 1CARD (OFFICE): Fecal Occult Bld: NEGATIVE

## 2010-08-23 LAB — DIFFERENTIAL
Eosinophils Absolute: 0.3 10*3/uL (ref 0.0–0.7)
Eosinophils Relative: 3 % (ref 0–5)
Lymphs Abs: 1.1 10*3/uL (ref 0.7–4.0)
Monocytes Absolute: 0.6 10*3/uL (ref 0.1–1.0)
Monocytes Relative: 6 % (ref 3–12)

## 2010-08-23 LAB — POCT CARDIAC MARKERS
CKMB, poc: 1.3 ng/mL (ref 1.0–8.0)
Myoglobin, poc: 80 ng/mL (ref 12–200)
Troponin i, poc: <0.05 ng/mL (ref 0.00–0.09)
Troponin i, poc: <0.05 ng/mL (ref 0.00–0.09)

## 2010-08-23 LAB — COMPREHENSIVE METABOLIC PANEL
ALT: 14 U/L (ref 0–35)
AST: 18 U/L (ref 0–37)
Albumin: 3.4 g/dL — ABNORMAL LOW (ref 3.5–5.2)
CO2: 34 mEq/L — ABNORMAL HIGH (ref 19–32)
Calcium: 8.8 mg/dL (ref 8.4–10.5)
Chloride: 96 mEq/L (ref 96–112)
GFR calc Af Amer: >60 mL/min (ref >60)
GFR calc non Af Amer: >60 mL/min (ref >60)
Sodium: 139 mEq/L (ref 135–145)

## 2010-08-23 LAB — GLUCOSE, CAPILLARY
Glucose-Capillary: 104 mg/dL — ABNORMAL HIGH (ref 70–99)
Glucose-Capillary: 116 mg/dL — ABNORMAL HIGH (ref 70–99)
Glucose-Capillary: 136 mg/dL — ABNORMAL HIGH (ref 70–99)

## 2010-08-23 LAB — CBC
MCHC: 32.4 g/dL (ref 30.0–36.0)
Platelets: 255 10*3/uL (ref 150–400)
RBC: 4.03 MIL/uL (ref 3.87–5.11)
WBC: 10 10*3/uL (ref 4.0–10.5)

## 2010-08-23 LAB — HEMOGLOBIN A1C
Hgb A1c MFr Bld: 7.1 % — ABNORMAL HIGH (ref 4.6–6.1)
Mean Plasma Glucose: 157 mg/dL

## 2010-08-23 LAB — CARDIAC PANEL(CRET KIN+CKTOT+MB+TROPI)
CK, MB: 1.2 ng/mL (ref 0.3–4.0)
Relative Index: INVALID (ref 0.0–2.5)
Relative Index: INVALID (ref 0.0–2.5)
Total CK: 47 U/L (ref 7–177)
Troponin I: 0.06 ng/mL (ref 0.00–0.06)

## 2010-08-23 LAB — LIPID PANEL
HDL: 39 mg/dL — ABNORMAL LOW (ref >39)
Total CHOL/HDL Ratio: 3.7 RATIO
Triglycerides: 214 mg/dL — ABNORMAL HIGH (ref <150)
VLDL: 43 mg/dL — ABNORMAL HIGH (ref 0–40)

## 2010-08-24 LAB — DIFFERENTIAL
Basophils Relative: 0 % (ref 0–1)
Eosinophils Absolute: 0.2 10*3/uL (ref 0.0–0.7)
Eosinophils Relative: 3 % (ref 0–5)
Lymphs Abs: 1.1 10*3/uL (ref 0.7–4.0)
Neutrophils Relative %: 80 % — ABNORMAL HIGH (ref 43–77)

## 2010-08-24 LAB — GLUCOSE, CAPILLARY
Glucose-Capillary: 107 mg/dL — ABNORMAL HIGH (ref 70–99)
Glucose-Capillary: 147 mg/dL — ABNORMAL HIGH (ref 70–99)
Glucose-Capillary: 102 mg/dL — ABNORMAL HIGH (ref 70–99)
Glucose-Capillary: 105 mg/dL — ABNORMAL HIGH (ref 70–99)
Glucose-Capillary: 105 mg/dL — ABNORMAL HIGH (ref 70–99)
Glucose-Capillary: 106 mg/dL — ABNORMAL HIGH (ref 70–99)
Glucose-Capillary: 107 mg/dL — ABNORMAL HIGH (ref 70–99)
Glucose-Capillary: 110 mg/dL — ABNORMAL HIGH (ref 70–99)
Glucose-Capillary: 112 mg/dL — ABNORMAL HIGH (ref 70–99)
Glucose-Capillary: 116 mg/dL — ABNORMAL HIGH (ref 70–99)
Glucose-Capillary: 162 mg/dL — ABNORMAL HIGH (ref 70–99)
Glucose-Capillary: 94 mg/dL (ref 70–99)

## 2010-08-24 LAB — CBC
Hemoglobin: 12.4 g/dL (ref 12.0–15.0)
Hemoglobin: 11.8 g/dL — ABNORMAL LOW (ref 12.0–15.0)
MCHC: 32.8 g/dL (ref 30.0–36.0)
MCHC: 32.4 g/dL (ref 30.0–36.0)
MCHC: 32.7 g/dL (ref 30.0–36.0)
MCV: 86.8 fL (ref 78.0–100.0)
Platelets: 236 10*3/uL (ref 150–400)
Platelets: 251 10*3/uL (ref 150–400)
RBC: 4.4 MIL/uL (ref 3.87–5.11)
RDW: 17.5 % — ABNORMAL HIGH (ref 11.5–15.5)
WBC: 9.3 10*3/uL (ref 4.0–10.5)

## 2010-08-24 LAB — URINALYSIS, ROUTINE W REFLEX MICROSCOPIC
Glucose, UA: NEGATIVE mg/dL
Ketones, ur: NEGATIVE mg/dL
Leukocytes, UA: NEGATIVE
Protein, ur: 100 mg/dL — AB
Urobilinogen, UA: 0.2 mg/dL (ref 0.0–1.0)

## 2010-08-24 LAB — BASIC METABOLIC PANEL
BUN: 21 mg/dL (ref 6–23)
BUN: 22 mg/dL (ref 6–23)
BUN: 25 mg/dL — ABNORMAL HIGH (ref 6–23)
BUN: 29 mg/dL — ABNORMAL HIGH (ref 6–23)
CO2: 34 mEq/L — ABNORMAL HIGH (ref 19–32)
CO2: 37 mEq/L — ABNORMAL HIGH (ref 19–32)
CO2: 39 mEq/L — ABNORMAL HIGH (ref 19–32)
Calcium: 8.5 mg/dL (ref 8.4–10.5)
Calcium: 8.8 mg/dL (ref 8.4–10.5)
Calcium: 8.8 mg/dL (ref 8.4–10.5)
Calcium: 8.9 mg/dL (ref 8.4–10.5)
Chloride: 91 mEq/L — ABNORMAL LOW (ref 96–112)
Chloride: 91 mEq/L — ABNORMAL LOW (ref 96–112)
Chloride: 93 mEq/L — ABNORMAL LOW (ref 96–112)
Chloride: 98 mEq/L (ref 96–112)
Creatinine, Ser: 1.06 mg/dL (ref 0.4–1.2)
Creatinine, Ser: 1.12 mg/dL (ref 0.4–1.2)
Creatinine, Ser: 1.41 mg/dL — ABNORMAL HIGH (ref 0.4–1.2)
GFR calc Af Amer: 47 mL/min — ABNORMAL LOW (ref >60)
GFR calc Af Amer: >60 mL/min (ref >60)
GFR calc Af Amer: >60 mL/min (ref >60)
GFR calc non Af Amer: 37 mL/min — ABNORMAL LOW (ref >60)
GFR calc non Af Amer: 39 mL/min — ABNORMAL LOW (ref >60)
GFR calc non Af Amer: 51 mL/min — ABNORMAL LOW (ref >60)
Glucose, Bld: 115 mg/dL — ABNORMAL HIGH (ref 70–99)
Glucose, Bld: 116 mg/dL — ABNORMAL HIGH (ref 70–99)
Potassium: 3.7 mEq/L (ref 3.5–5.1)
Potassium: 4.2 mEq/L (ref 3.5–5.1)
Sodium: 134 mEq/L — ABNORMAL LOW (ref 135–145)
Sodium: 139 mEq/L (ref 135–145)

## 2010-08-24 LAB — BLOOD GAS, ARTERIAL
Bicarbonate: 31.9 mEq/L — ABNORMAL HIGH (ref 20.0–24.0)
Drawn by: 30806
FIO2: 0.3 %
Inspiratory PAP: 14
Mode: POSITIVE
O2 Content: 2 L/min
O2 Saturation: 91.3 %
Patient temperature: 98.6
Patient temperature: 98.6
TCO2: 33.3 mmol/L (ref 0–100)
TCO2: 33.3 mmol/L (ref 0–100)
TCO2: 34.3 mmol/L (ref 0–100)
pCO2 arterial: 78.4 mmHg (ref 35.0–45.0)
pH, Arterial: 7.216 — ABNORMAL LOW (ref 7.350–7.400)
pH, Arterial: 7.219 — ABNORMAL LOW (ref 7.350–7.400)

## 2010-08-24 LAB — CARDIAC PANEL(CRET KIN+CKTOT+MB+TROPI)
CK, MB: 13.3 ng/mL — ABNORMAL HIGH (ref 0.3–4.0)
CK, MB: 6.8 ng/mL — ABNORMAL HIGH (ref 0.3–4.0)
Relative Index: 11.3 — ABNORMAL HIGH (ref 0.0–2.5)

## 2010-08-24 LAB — COMPREHENSIVE METABOLIC PANEL
ALT: 16 U/L (ref 0–35)
AST: 20 U/L (ref 0–37)
Alkaline Phosphatase: 82 U/L (ref 39–117)
CO2: 29 mEq/L (ref 19–32)
Calcium: 9.3 mg/dL (ref 8.4–10.5)
Chloride: 102 mEq/L (ref 96–112)
GFR calc Af Amer: >60 mL/min (ref >60)
GFR calc non Af Amer: 58 mL/min — ABNORMAL LOW (ref >60)
Glucose, Bld: 139 mg/dL — ABNORMAL HIGH (ref 70–99)
Potassium: 4.1 mEq/L (ref 3.5–5.1)
Sodium: 139 mEq/L (ref 135–145)

## 2010-08-24 LAB — MAGNESIUM
Magnesium: 1.6 mg/dL (ref 1.5–2.5)
Magnesium: 1.8 mg/dL (ref 1.5–2.5)

## 2010-08-24 LAB — PHOSPHORUS
Phosphorus: 3.7 mg/dL (ref 2.3–4.6)
Phosphorus: 4.7 mg/dL — ABNORMAL HIGH (ref 2.3–4.6)

## 2010-08-24 LAB — POCT CARDIAC MARKERS
CKMB, poc: 2.1 ng/mL (ref 1.0–8.0)
CKMB, poc: 8.3 ng/mL (ref 1.0–8.0)
Myoglobin, poc: 225 ng/mL (ref 12–200)
Myoglobin, poc: 235 ng/mL (ref 12–200)

## 2010-08-24 LAB — DRUGS OF ABUSE SCREEN W/O ALC, ROUTINE URINE
Benzodiazepines.: NEGATIVE
Marijuana Metabolite: NEGATIVE
Methadone: NEGATIVE
Opiate Screen, Urine: NEGATIVE

## 2010-08-24 LAB — HEMOGLOBIN A1C: Mean Plasma Glucose: 146 mg/dL

## 2010-08-24 LAB — PROTIME-INR: INR: 1.01 (ref 0.00–1.49)

## 2010-08-24 LAB — BRAIN NATRIURETIC PEPTIDE: Pro B Natriuretic peptide (BNP): 48.5 pg/mL (ref 0.0–100.0)

## 2010-08-24 LAB — TSH: TSH: 2.236 u[IU]/mL (ref 0.350–4.500)

## 2010-08-24 LAB — LIPID PANEL: Cholesterol: 244 mg/dL — ABNORMAL HIGH (ref 0–200)

## 2010-08-27 LAB — BASIC METABOLIC PANEL
BUN: 19 mg/dL (ref 6–23)
Creatinine, Ser: 1.09 mg/dL (ref 0.4–1.2)
GFR calc non Af Amer: 53 mL/min — ABNORMAL LOW (ref >60)

## 2010-08-27 LAB — CBC
Platelets: 241 10*3/uL (ref 150–400)
WBC: 12.5 10*3/uL — ABNORMAL HIGH (ref 4.0–10.5)

## 2010-08-27 LAB — POCT CARDIAC MARKERS
Myoglobin, poc: 99.9 ng/mL (ref 12–200)
Troponin i, poc: <0.05 ng/mL (ref 0.00–0.09)

## 2010-08-27 LAB — DIFFERENTIAL
Lymphocytes Relative: 14 % (ref 12–46)
Lymphs Abs: 1.7 10*3/uL (ref 0.7–4.0)
Neutrophils Relative %: 82 % — ABNORMAL HIGH (ref 43–77)

## 2010-08-28 LAB — COMPREHENSIVE METABOLIC PANEL
AST: 16 U/L (ref 0–37)
Albumin: 3.4 g/dL — ABNORMAL LOW (ref 3.5–5.2)
Chloride: 105 mEq/L (ref 96–112)
Creatinine, Ser: 0.83 mg/dL (ref 0.4–1.2)
GFR calc Af Amer: >60 mL/min (ref >60)
Potassium: 3.4 mEq/L — ABNORMAL LOW (ref 3.5–5.1)
Total Bilirubin: 0.3 mg/dL (ref 0.3–1.2)
Total Protein: 7 g/dL (ref 6.0–8.3)

## 2010-08-28 LAB — CBC
MCV: 83.5 fL (ref 78.0–100.0)
Platelets: 264 10*3/uL (ref 150–400)
WBC: 9.5 10*3/uL (ref 4.0–10.5)

## 2010-08-28 LAB — DIFFERENTIAL
Basophils Absolute: 0 10*3/uL (ref 0.0–0.1)
Eosinophils Relative: 4 % (ref 0–5)
Lymphocytes Relative: 14 % (ref 12–46)
Lymphs Abs: 1.3 10*3/uL (ref 0.7–4.0)
Monocytes Absolute: 0.7 10*3/uL (ref 0.1–1.0)
Monocytes Relative: 7 % (ref 3–12)

## 2010-08-28 LAB — POCT CARDIAC MARKERS
CKMB, poc: <1 ng/mL — ABNORMAL LOW (ref 1.0–8.0)
Troponin i, poc: <0.05 ng/mL (ref 0.00–0.09)

## 2010-08-29 LAB — URINALYSIS, ROUTINE W REFLEX MICROSCOPIC
Leukocytes, UA: NEGATIVE
Nitrite: NEGATIVE
Protein, ur: 100 mg/dL — AB
Specific Gravity, Urine: 1.01 (ref 1.005–1.030)
Urobilinogen, UA: 0.2 mg/dL (ref 0.0–1.0)

## 2010-08-29 LAB — BASIC METABOLIC PANEL
Calcium: 9 mg/dL (ref 8.4–10.5)
Creatinine, Ser: 0.84 mg/dL (ref 0.4–1.2)
GFR calc Af Amer: >60 mL/min (ref >60)
GFR calc non Af Amer: >60 mL/min (ref >60)
Sodium: 140 mEq/L (ref 135–145)

## 2010-08-29 LAB — URINE CULTURE: Colony Count: 65000

## 2010-08-29 LAB — DIFFERENTIAL
Basophils Absolute: 0 10*3/uL (ref 0.0–0.1)
Basophils Relative: 0 % (ref 0–1)
Lymphocytes Relative: 12 % (ref 12–46)
Monocytes Absolute: 0.5 10*3/uL (ref 0.1–1.0)
Monocytes Relative: 6 % (ref 3–12)
Neutro Abs: 6.9 10*3/uL (ref 1.7–7.7)
Neutrophils Relative %: 81 % — ABNORMAL HIGH (ref 43–77)

## 2010-08-29 LAB — CBC
Hemoglobin: 13.3 g/dL (ref 12.0–15.0)
RBC: 4.84 MIL/uL (ref 3.87–5.11)
RDW: 17.3 % — ABNORMAL HIGH (ref 11.5–15.5)

## 2010-08-29 LAB — URINE MICROSCOPIC-ADD ON

## 2010-08-29 LAB — PROTIME-INR: INR: 1 (ref 0.00–1.49)

## 2010-08-29 LAB — POCT CARDIAC MARKERS: Troponin i, poc: <0.05 ng/mL (ref 0.00–0.09)

## 2010-08-29 LAB — APTT: aPTT: 30 seconds (ref 24–37)

## 2010-08-31 LAB — BASIC METABOLIC PANEL
BUN: 12 mg/dL (ref 6–23)
CO2: 33 mEq/L — ABNORMAL HIGH (ref 19–32)
Chloride: 99 mEq/L (ref 96–112)
Creatinine, Ser: 0.83 mg/dL (ref 0.4–1.2)
Glucose, Bld: 106 mg/dL — ABNORMAL HIGH (ref 70–99)
Potassium: 3.1 mEq/L — ABNORMAL LOW (ref 3.5–5.1)

## 2010-08-31 LAB — CBC
HCT: 35.4 % — ABNORMAL LOW (ref 36.0–46.0)
MCHC: 32.7 g/dL (ref 30.0–36.0)
MCV: 84.8 fL (ref 78.0–100.0)
Platelets: 285 10*3/uL (ref 150–400)
RDW: 16.7 % — ABNORMAL HIGH (ref 11.5–15.5)
WBC: 9.6 10*3/uL (ref 4.0–10.5)

## 2010-08-31 LAB — DIFFERENTIAL
Basophils Absolute: 0 10*3/uL (ref 0.0–0.1)
Basophils Relative: 0 % (ref 0–1)
Eosinophils Absolute: 0.3 10*3/uL (ref 0.0–0.7)
Eosinophils Relative: 3 % (ref 0–5)
Lymphs Abs: 1.1 10*3/uL (ref 0.7–4.0)
Neutrophils Relative %: 78 % — ABNORMAL HIGH (ref 43–77)

## 2010-08-31 LAB — BRAIN NATRIURETIC PEPTIDE: Pro B Natriuretic peptide (BNP): 46.3 pg/mL (ref 0.0–100.0)

## 2010-08-31 LAB — POCT CARDIAC MARKERS: Troponin i, poc: <0.05 ng/mL (ref 0.00–0.09)

## 2010-09-05 ENCOUNTER — Other Ambulatory Visit: Payer: Self-pay | Admitting: Internal Medicine

## 2010-10-03 NOTE — H&P (Signed)
[name], HOWSER              ACCOUNT NO.:  [id number]   MEDICAL RECORD NO.:  [id number]          PATIENT TYPE:  IPS   LOCATION:  4028                         FACILITY:  [facility]   PHYSICIAN:  Ranelle Oyster, M.D.DATE OF BIRTH:  03/06/1956   DATE OF ADMISSION:  07/16/2007  DATE OF DISCHARGE:                              HISTORY & PHYSICAL   CHIEF COMPLAINT:  Deconditioning, shortness of breath.   HISTORY OF PRESENT ILLNESS:  This is a pleasant 55 year old African  American female with morbid obesity and CHF, who was admitted on  February 17 with shortness of breath after a prolonged course of  worsening cardiorespiratory issues.  Chest x-ray was positive for CHF.  She was placed on IV Lasix and diuresed.  She was also placed on IV  Unasyn for some concern over aspiration pneumonia.  She was on a  ventilator for a short period time and then weaned.  She has had  multiple blood pressure changes and medications have been adjusted to  such.  She is on subcu Lovenox for DVT prophylaxis.  She is now on p.o.  Lasix 40 mg q.12 h. per home dose.  She still complains of significant  shortness of breath and deconditioning.  She is on another week of  Augmentin for her pulmonary issues.  The patient has been admitted to  inpatient rehab today to improve upon these functional and physical  deficits.   REVIEW OF SYSTEMS:  Positive shortness breath, weakness, reflux, low  back pain, cough.  The patient had constipation and decreased appetite.  She has been urinating frequently due to diuresis.   PAST MEDICAL HISTORY:  Positive for:  1. Morbid obesity.  2. Hypertension.  3. Non-insulin-requiring diabetes.  4. CAD with permanent pacemaker.  5. Hyperlipidemia.  6. Sleep apnea.  7. Questionable CPAP use at home.  8. CHF.  9. Hysterectomy.   SOCIAL HISTORY:  She denies alcohol or tobacco.   FAMILY HISTORY:  Positive for CAD.   SOCIAL HISTORY:  The patient lives with her husband; they  live in a 1-  level house with 2 steps to enter.  Husband works days.  Family can  assist her as needed.   FUNCTIONAL STATUS:  She used a cane prior to arrival, but was more  limited due to pulmonary issues.  She is currently mod to max assist for  basic mobility and transfers.   ALLERGIES:  NONE.   HOME MEDICATIONS:  1. Simvastatin 80 mg daily.  2. Hydralazine 100 mg q.i.d.  3. Klor-Con 20 mEq b.i.d.  4. Lasix 40 mg b.i.d.  5. Labetalol 300 mg t.i.d.  6. Amlodipine 10 mg daily.  7. Clonidine 0.2 mg b.i.d.  8. Lisinopril 20 mg b.i.d.  9. Glipizide 5 mg daily   LABORATORY DATA:  Hemoglobin 9.9, white count 8.2, platelets 258,000.  Sodium 140, potassium 3.3, BUN and creatinine 9 and 0.8.   PHYSICAL EXAMINATION:  VITAL SIGNS:  Blood pressure is 163/100, pulse  75, respiratory 20, temperature 98.2.  GENERAL:  The patient is pleasant, sitting independently in her chair  eating lunch.  She  is morbidly obese.  She is alert and pleasant,  however.  ENT:  Exam is unremarkable.  Fair dentition and pink moist mucosa.  NECK:  Supple without JVD or lymphadenopathy.  CHEST:  Notable for a few wheezes and rales at the bases.  HEART: Regular rate and rhythm without murmur, rubs or gallop.  ABDOMEN:  Soft and nontender.  Bowel sounds are positive.  EXTREMITIES:  Showed no clubbing, cyanosis and 1+ edema bilaterally in  the lower extremities.  SKIN:  Generally intact.  NEUROLOGICAL:  Cranial nerves II-XII are normal.  Reflexes are 2+ to 1+.  Sensation is intact.  Judgment, orientation, memory and mood are all  appropriate.   ASSESSMENT AND PLAN:  1. Functional deficit secondary deconditioning after multiple medical      issues including congestive heart failure and pneumonia.  Begin      comprehensive inpatient rehabilitation with Physical Therapy to      assess and treat for mobility, for range of motion, equipment and      safety.  Occupational Therapy will assess and treat for range  of      motion, activities of daily living and equipment.  Rehabilitation      nurse will follow on a 24-hour basis for bowel, bladder, skin,      medication and safety issues.  Rehabilitation case manager/social      worker will assess for psychosocial needs and discharge planning.      Estimated length of stay is 2 weeks.  Goals are modified      independent.  Prognosis is fair to good.  2. Coronary artery disease/permanent pacemaker:  Continue Norvasc and      labetalol as well as lisinopril.  Lasix on board for congestive      heart failure.  Monitor ins and outs, weights, and so forth.  3. Hypertension:  Continue above medications plus clonidine.  4. Diabetes:  Continue to check capillary blood glucoses before meals      and at bedtime.  Monitor for elevated sugars.  5. Hyperlipidemia:  Zocor.  6. Deep venous thrombosis prophylaxis:  Subcutaneous Lovenox.  7. Pain:  The patient has significant knee and back pain related to      osteoarthritis and degenerative joint disease.  We will begin      scheduled analgesics in the morning prior to therapies.  Consider      injections of both knees.  8. Infectious Disease:  Continue Augmentin for 7 days total for      aspiration pneumonia.      Ranelle Oyster, M.D.  Electronically Signed     ZTS/MEDQ  D:  07/16/2007  T:  07/17/2007  Job:  [id number]

## 2010-10-03 NOTE — Group Therapy Note (Signed)
NAMESUZY, KUGEL              ACCOUNT NO.:  [id number]   MEDICAL RECORD NO.:  [id number]          PATIENT TYPE:  INP   LOCATION:  A209                          FACILITY:  [facility]   PHYSICIAN:  Edward L. Juanetta Gosling, M.D.DATE OF BIRTH:  1956-04-28   DATE OF PROCEDURE:  DATE OF DISCHARGE:  07/16/2007                                 PROGRESS NOTE   Ms. [name] is better I think.  She is getting up and moving around a  little more.  Has been able to get to the bedside commode this morning.  She is eating a little better.  She is coughing some.  She had some  fever last night to 100.4.   This morning she is afebrile, pulse 79, respirations 24, blood pressure  162/98, O2 sat 95%.  Her blood sugars 157.  Her chest is clearing, but she has fairly markedly decreased breath  sounds which makes it more difficult to fully evaluate what is going on.   ASSESSMENT:  She is better.  She refused the PICC line so I am going to  get that central line out.  I am concerned that it could get infected.  Going to see if the IV team can get an IV and her at all today.  Continue with all her other treatments in the meantime and we are hoping  to get her transferred to [facility] perhaps later today.  If not going to look at skilled care facility placement for  rehabilitation or intensive home physical therapy.  Going to have a  chest x-ray today.  Get a CBC today and get that central line out.      Edward L. Juanetta Gosling, M.D.  Electronically Signed     ELH/MEDQ  D:  07/16/2007  T:  07/16/2007  Job:  [id number]

## 2010-10-03 NOTE — Group Therapy Note (Signed)
NAMECharish, Schroepfer [name]              ACCOUNT NO.:  [id number]   MEDICAL RECORD NO.:  [id number]          PATIENT TYPE:  INP   LOCATION:  IC10                          FACILITY:  [facility]   PHYSICIAN:  Edward L. Juanetta Gosling, M.D.DATE OF BIRTH:  01-24-1956   DATE OF PROCEDURE:  DATE OF DISCHARGE:                                 PROGRESS NOTE   Ms. [name] was able to be extubated successfully yesterday and has done  pretty well.  She was on BiPAP again last night.  I am fairly certain  that she has sleep apnea.  She has not been tested and will need to do  that as an outpatient.  She says her throat is a little sore but  otherwise she is feeling okay.  She is still coughing some but not  bringing much of anything.   PHYSICAL EXAMINATION:  GENERAL:  Shows that she is awake, alert.  VITALS:  Blood pressure about 150/90 which is really pretty good for  her.  Heart rates is in the 80s.  HEENT:  She is somewhat hoarse.  Her morbid obesity is unchanged of  course.  CHEST:  Shows decreased breath sounds but I do not hear a lot of rales,  rhonchi, etc..  HEART:  Regular without gallop.  ABDOMEN:  Soft and no masses felt.  EXTREMITIES:  Show chronic venous stasis.   LABORATORY DATA:  Her B met shows a BUN is 14, creatinine 1.1, potassium  is 3, so will need to replace that.  CBC shows white count 8000,  hemoglobin 10.1, platelets 250 and her blood gas on BiPAP pH 7.36, pCO2  of 58, pO2 of 107.  Overall I think she is much improved.  Her chest x-  ray still being read as pulmonary edema with cardiomegaly. I do not  think that is the actually etiology of the changes on her chest x-ray.   PLAN:  My plan then I am going to leave her in the intensive care unit  today.  Will see if we can get her up in a chair.  Replace her  potassium.  Continue with her other treatments and follow.      Edward L. Juanetta Gosling, M.D.  Electronically Signed     ELH/MEDQ  D:  07/12/2007  T:  07/12/2007  Job:  [id number]

## 2010-10-03 NOTE — Discharge Summary (Signed)
[name], [name]              ACCOUNT NO.:  [id number]   MEDICAL RECORD NO.:  [id number]          PATIENT TYPE:  INP   LOCATION:  A201                          FACILITY:  [facility]   PHYSICIAN:  Edward L. Juanetta Gosling, M.D.DATE OF BIRTH:  1955/10/05   DATE OF ADMISSION:  05/25/2007  DATE OF DISCHARGE:  LH                               DISCHARGE SUMMARY   DISCHARGE DIAGNOSES:  1. Accelerated hypertension.  2. Morbid obesity.  3. History of diabetes mellitus.  4. Bradyarrhythmia requiring pacemaker insertion.  5. Asthma.  6. Diabetic neuropathy.  7. Osteoarthritis.  8. Hyperlipidemia.   [name] is a 55 year old who has a long known history of severe  hypertension which been difficult to control.  She has not been feeling  well for about 10 days prior to admission.  She had been short of breath  and weak.  She even increased her diuretics thinking that she had excess  fluid, which has happened to her in the past.  She does have what is  assumed to be diastolic congestive heart failure.   She came to the emergency room and  eventually was found have a blood  pressure 224/115.  She was started on a nitroglycerin drip in the  emergency room and improved.  She was admitted to the Intensive Care  Unit on the nitroglycerin drip.  Her physical exam on admission blood  pressure had come down in the 190s, pulse 126, respirations 22,  temperature 98.  Her heart was regular without a gallop.  Her abdomen  was soft and obese.  No masses.  Extremities: 1+ edema.  Electrolytes  were normal.  BUN 9, creatinine 0.4.  Cardiac enzymes were negative D-  dimer was up, but CT scan was negative for pulmonary embolism.  White  count was 9900, hemoglobin 12.5.   HOSPITAL COURSE:  She was admitted on nitroglycerin.  She had cardiology  consultation and her blood pressure came down rapidly.  By the time of  discharge, her pressure was in the 130s, and she was discharged home on  simvastatin 80 mg daily,  hydralazine 100 mg four times a day, potassium  chloride 20 mEq two times a day, Lasix 40 mg twice a day.  She was on  Ceftin at home.  She is going to finish that.  Labetalol 600 mg twice a  day, clonidine 0.2 mg three times a day, lisinopril 20 mg, a new  prescription total two daily.  ProAir HFA as needed for asthma.  She is going to have her O2 sat checked on room air and if it is low,  she will be on oxygen.  She may have a 24-hour oximetry at home.  She is  going to be on a heart-healthy diet, diabetic.  She is going to continue  on Glucotrol 5 mg daily.  She will follow in my office in about a week.      Edward L. Juanetta Gosling, M.D.  Electronically Signed     ELH/MEDQ  D:  05/28/2007  T:  05/28/2007  Job:  [id number]

## 2010-10-03 NOTE — Discharge Summary (Signed)
[name], [name]              ACCOUNT NO.:  [id number]   MEDICAL RECORD NO.:  [id number]          PATIENT TYPE:  IPS   LOCATION:  4028                         FACILITY:  [facility]   PHYSICIAN:  Ranelle Oyster, M.D.DATE OF BIRTH:  [birth year]   DATE OF ADMISSION:  07/16/2007  DATE OF DISCHARGE:  07/23/2007                               DISCHARGE SUMMARY   DISCHARGE DIAGNOSES:  1. Deconditioning.  2. Aspiration pneumonia.  3. Congestive heart failure.  4. Coronary artery disease with permanent pacemaker.  5. Morbid obesity.  6. Hypertension.  7. Diabetes mellitus.  8. Hyperlipidemia.  9. Hypokalemia, resolved.  10.Subcutaneous Lovenox for deep vein thrombosis prophylaxis.   This is a 55 year old African-American female, morbid obesity, who was  admitted to [facility] February 17 with shortness of  breath, generalized weakness.  Chest x-ray showed congestive heart  failure.  CT of the chest negative for pulmonary emboli.  Placed on  intravenous Lasix as well as Unasyn for questionable aspiration  pneumonia.  Remained on a ventilator for a short time and slowly weaned.  Blood pressures with variables on multiple antihypertensive medications  per Dr. Juanetta Gosling.  Subcutaneous Lovenox for deep vein thrombosis  prophylaxis.  Intravenous Lasix changed to p.o. Lasix 40 mg and  monitored.  Chest x-ray with improved aeration.  Unasyn changed to  Augmentin x7 days.   PAST MEDICAL HISTORY:  See discharge diagnoses.  No alcohol or tobacco.   ALLERGIES:  None.   SOCIAL HISTORY:  Lives with husband in one-level home, two steps to  entry.  Husband works days.  Family assistance as needed.   Medications prior to admission were Zocor 80 mg daily, hydralazine 100  mg 4 times daily, Klor-Con 20 mEq twice daily, Lasix 40 mg twice daily,  labetalol 300 mg 3 times daily, Norvasc 10 mg daily, clonidine 0.2 mg  twice daily, lisinopril 20 mg twice daily, and glipizide 5 mg  daily.   REHABILITATION HOSPITAL COURSE:  The patient was admitted to inpatient  rehab services with therapies initiated on a 3-hour daily basis  consisting of physical therapy, occupational therapy and rehabilitation  nursing.  The following issues were addressed during the patient's  rehabilitation stay:  Pertaining to [name]' deconditioning, multi-  medical, she continued to do well.  Fatigue and endurance factors  continued to improve ever so slowly.  She was minimal assist to  ambulate, close supervision to minimal assist transfers, still doing  minimal assistance for lower body activities of daily living.  Home  therapies would be ongoing.  Full family support remained excellent.  She remained on subcutaneous Lovenox for deep vein thrombosis  prophylaxis throughout her rehab course.  Lungs were clear to  auscultation.  Oxygen saturations greater than 90% on room air.  She  continued on Lasix with no signs of fluid overload.  She was completing  a course of Augmentin for aspiration pneumonia.  Noted history of  permanent pacemaker with coronary artery disease.  She would follow up  with [facility] as advised.  Blood pressures monitored on  numerous antihypertensive medications with the  latest diastolic  pressures 74 to 88.  She would follow up with her primary MD.  Blood  sugars again with some increased variables.  Her glipizide was resumed.  She received full diabetic teaching.  She would remain on Zocor for  hyperlipidemia.   Latest labs showed hemoglobin 10.5, hematocrit 31.3, platelet  384,000.  Sodium 139, potassium 4.1, BUN 9, creatinine 0.9.  She was discharged to  home.   Discharge medications included lisinopril 20 mg twice daily, Reglan  5  mg a.c. and h.s., potassium chloride 20 mEq twice daily, Zocor 80 mg  daily, Norvasc 10 mg daily, Lasix 40 mg daily, labetalol 300 mg every 8  hours, Ultram 50 mg every 6 hours as needed pain, clonidine patch 0.3 mg   change every 7 days, albuterol inhaler 2 puffs 3 times daily, nystatin  topical applied to affected areas 3 times daily as needed until  improved, glipizide 5 mg daily.   DIET:  Diabetic diet.   SPECIAL INSTRUCTIONS:  Follow up with Dr. Juanetta Gosling, medical management,  [facility] for history of permanent pacemaker, coronary artery  disease.      Mariam Dollar, P.A.      Ranelle Oyster, M.D.  Electronically Signed    DA/MEDQ  D:  07/22/2007  T:  07/22/2007  Job:  [id number]   cc:   Ranelle Oyster, M.D.  Edward L. Juanetta Gosling, M.D.  [facility]

## 2010-10-03 NOTE — Op Note (Signed)
NAMEAILANA, CUADRADO              ACCOUNT NO.:  [id number]   MEDICAL RECORD NO.:  [id number]          PATIENT TYPE:  INP   LOCATION:  2007                         FACILITY:  [facility]   PHYSICIAN:  Duke Salvia, MD, FACCDATE OF BIRTH:  1956-04-18   DATE OF PROCEDURE:  03/28/2007  DATE OF DISCHARGE:                               OPERATIVE REPORT   PREOPERATIVE DIAGNOSIS:  Intermittent heart block and ventricular  tachycardia.   POSTOPERATIVE DIAGNOSIS:  Intermittent heart block and ventricular  tachycardia.   PROCEDURE:  Dual chamber device implantation with contrast venogram.   Following obtaining informed consent, the patient was brought to the  electrophysiology laboratory and placed on the fluoroscopic table in a  supine position.  After routine prep and drape, lidocaine was  infiltrated under fluoroscopic guidance so we could identify the  anatomical landmarks because of her morbid obesity.  We ended up going  down to the prepectoral fascia which was about a 2 inch to 3 inch  dissection until we finally got to the prepectoral fascia and a pocket  was fashioned.   Venogram demonstrated the course of the extrathoracic left subclavian  vein and allowed for ready access to the vein and a single venipuncture  was accomplished.  Several wires were used and then, sequentially 7  French sheaths were placed through which was passed a Medtronic 5076, 58  cm active fixation ventricular lead, serial number [id number], and a  Medtronic 5076, 52 cm active fixation atrial lead, serial number  [id number].  The ventricular lead was marked with a tie.  Under  fluoroscopic guidance, the leads were manipulated to the right  ventricular apex and the right atrial appendage respectively where the  bipolar R-wave was 12.9 with a pacing impedance 1160 ohms, a threshold  0.7 volts at 0.5 milliseconds, current threshold 0.7 MA.  There was no  diaphragmatic pacing at 10 volts and the current of injury  was brisk.  The bipolar P-wave there was 2.7 mV with a pacing impedance of 784 ohms,  a threshold of 1 volt at 0.5 milliseconds, current threshold 1.4 MA.  There is no diaphragmatic pacing at 10 volts and the current of injury  was brisk.   The leads were secured to the prepectoral fascia and attached to a  Medtronic Sensia pulse generator, serial number [id number], the model  number was [id number].  P-synchronous pacing was identified.  The pocket was  copiously irrigated with antibiotic containing saline solution.  Hemostasis was assured.  The leads and the pulse generator were placed  in the pocket and secured to the prepectoral fascia.  Surgicel was used  in the depths of the wound.  The wound was then closed in three layers  in the normal fashion.  The wound was washed, dried, and a Benzoin and  Steri-Strip dressing was applied.  The needle counts and instrument  counts were correct according to the staff.  The sponge count was not,  the pocket was fluoroscoped, there were no sponges in the pocket. The  patient's wound was  then washed, dried and Benzoin and Steri-Strip dressing was applied.  Needle count, sponge counts and instrument counts were correct at the  end of the procedure according to the staff.  The patient tolerated the  procedure without apparent complication.      Duke Salvia, MD, [facility]  Electronically Signed     SCK/MEDQ  D:  03/28/2007  T:  03/29/2007  Job:  [id number]

## 2010-10-03 NOTE — Group Therapy Note (Signed)
[name], [name] [name]              ACCOUNT NO.:  [id number]   MEDICAL RECORD NO.:  [id number]          PATIENT TYPE:  INP   LOCATION:  IC10                          FACILITY:  [facility]   PHYSICIAN:  Edward L. Juanetta Gosling, M.D.DATE OF BIRTH:  [birth year]   DATE OF PROCEDURE:  07/09/2007  DATE OF DISCHARGE:                                 PROGRESS NOTE   [name] was admitted yesterday in the to the ICU after having had to  be intubated.  She remains on the ventilator.  She is well sedated.  No  new problems have been noted.   PHYSICAL EXAMINATION:  Now shows her blood pressures in the 160/90  range.  Her pulse about 100.  Respirations 18.  Her chest shows some wheezes bilaterally.  Her heart is regular without gallop.   CBC shows no white count 9000, hemoglobin 11.3, platelets 253,000.  Blood gas on 40% 500 rate of 18, pH 7.36, pCO2 of 59, pO2 of 61.  She is  also on five of PEEP. Her BMET shows potassium is 3.5, BUN 12,  creatinine 0.9.  Her chest x-ray shows diffuse bilateral infiltrates.  Her previous chest x-ray also showed diffuse bilateral infiltrates but  is suggestive of congestive heart failure but I do not think that is the  diagnosis. BNP is less than 30.  Her clinical picture is not that of  congestive heart failure.  I think she is probably aspirated.   ASSESSMENT:  Is that she has respiratory failure probably on the basis  of aspiration pneumonia, sleep apnea, morbid obesity.  She has severe  hypertension which has been very difficult to control.  She has had  cardiac arrhythmias and has had a pacemaker for that.  She has chronic  venous stasis.  We did do pulmonary we did do a CT chest which did not  show pulmonary emboli.   PLAN:  To continue with her treatments.  I do not think we have much  opportunity to wean today.  I am going to reevaluate her antibiotics and  add some potassium replacement.      Edward L. Juanetta Gosling, M.D.  Electronically Signed     ELH/MEDQ   D:  07/10/2007  T:  07/10/2007  Job:  [id number]

## 2010-10-03 NOTE — Group Therapy Note (Signed)
NAMEAlveta, [name]              ACCOUNT NO.:  [id number]   MEDICAL RECORD NO.:  [id number]          PATIENT TYPE:  INP   LOCATION:  IC10                          FACILITY:  [facility]   PHYSICIAN:  Edward L. Juanetta Gosling, M.D.DATE OF BIRTH:  March 21, 1956   DATE OF PROCEDURE:  07/10/2007  DATE OF DISCHARGE:                                 PROGRESS NOTE   PROBLEM:  Respiratory failure, probably on the basis of aspiration  pneumonia, obesity hypoventilation and severe hypertension.   SUBJECTIVE:  [name] seems more alert this morning.  She is more  awake.  She is moving air better and overall looks better.  This morning  and her she has had no new problems noted through the night.   PHYSICAL EXAMINATION:  Shows she is intubated ventilated but arousable.  She has NG tube in place as well.  Her her blood pressure 131/81 now, pulse in the 60s.  She is afebrile.   She did have some temperature in the last 24 hours of 38.8, white blood  count when she came into the hospital was 12,000, this morning is 8000,  hemoglobin 11.3, albumin is 2.9.  She is receiving feedings. I&O +539, -  1985 yesterday, potassium 3.2.  Her blood gas this morning shows pO2 99,  pCO2 of 46.7, pH 7.41.   There has been some vomitus which looks more like secretions than tube  feedings or anything like that.   ASSESSMENT:  She has congestive heart failure which is better.  She has  morbid obesity which of course is unchanged.  Had a pacemaker.  She is  diabetic.  Her blood sugars been pretty good.  She has what I believe  his sleep apnea and probably aspiration pneumonia.   PLAN:  See if she can wean.  I am going to increase her tube feedings  slightly and then continue from there.  We will see if she is able to  wean at all today.      Edward L. Juanetta Gosling, M.D.  Electronically Signed     ELH/MEDQ  D:  07/11/2007  T:  07/11/2007  Job:  [id number]

## 2010-10-03 NOTE — Assessment & Plan Note (Signed)
[facility]                         ELECTROPHYSIOLOGY OFFICE NOTE   NAME:[name], [name] PASSON                     MRN:          [id number]  DATE:04/28/2008                            DOB:          February 07, 1956    [name] returns today for followup.  She is a very pleasant middle-  aged lady with a history of morbid obesity as well as hypertension,  symptomatic bradycardia, and paroxysmal atrial arrhythmias.  The patient  continues to have some difficulty mostly with palpitations and  significant fatigue.  She has chronic lymphedema secondary to her morbid  obesity.  She has for many many years weighed over 300 pounds.  In the  last couple of years, she has weighed up over nearly 400 pounds.  Today,  we talked in detail about diet.   MEDICATIONS:  1. Torsemide 100 mg daily.  2. Tekturna 300 daily.  3. Labetalol 600 twice a day.  4. Glipizide 5 a day.   PHYSICAL EXAMINATION:  GENERAL:  She is a pleasant, morbidly obese woman  in no distress.  VITAL SIGNS:  Blood pressure today was 190/100, the pulse was 73 and  regular, respirations were 18, and the weight was 395 pounds.  NECK:  No jugular distention.  LUNGS:  Clear bilaterally to auscultation.  No wheezes, rales, or  rhonchi present.  There is no increased work of breathing.  CARDIOVASCULAR:  Regular rate and rhythm.  Normal S1 and S2.  The heart  sounds were distant.  ABDOMEN:  Morbidly obese.  EXTREMITIES:  Demonstrated lymphedema.   Interrogation of her pacemaker demonstrates a [employer] [place].  P-waves  are greater than 2 and R-waves are 16.  The impedance was 633 in the A  and 874 in the V.  The threshold 0.75 at 0.4 in the right atrium and the  right ventricle.  Battery voltage was 2.78 volts.  She had 2 mode  switches less than 0.1% of the time.  She was 10% A-paced and 12% V-  paced.   IMPRESSION:  1. Symptomatic bradycardia.  2. Paroxysmal atrial arrhythmias.  3. Morbid obesity.  4. Hypertension.   DISCUSSION:  [name] is stable.  I have encouraged her to lose  weight.  I have asked that she just drink water.  No sodas whatsoever.  I have asked that she try and limit her food intake  to vegetables and fruits and stop using oils, butters, or spreads.  I  plan to see her back in a year.     Doylene Canning. Ladona Ridgel, MD  Electronically Signed    GWT/MedQ  DD: 04/28/2008  DT: 04/29/2008  Job #: [id number]

## 2010-10-03 NOTE — Group Therapy Note (Signed)
NAMEBEKKI, TAVENNER              ACCOUNT NO.:  [id number]   MEDICAL RECORD NO.:  [id number]          PATIENT TYPE:  INP   LOCATION:  A215                          FACILITY:  [facility]   PHYSICIAN:  Edward L. Juanetta Gosling, M.D.DATE OF BIRTH:  04/12/1956   DATE OF PROCEDURE:  DATE OF DISCHARGE:                                 PROGRESS NOTE   Ms. Geisinger was admitted yesterday with shortness of breath without an  obvious etiology at that time.  She had vomited but she says she does  not think that she aspirated.  Her blood pressure was quite elevated so  that was a problem.   Her temperature last night went to 103.2, pulse is 96, respirations 20,  blood pressure 136/72.  Her O2 sats 93% on 3 liters.  Her blood pressure  is much improved.  Her blood sugar 164 then 152.  Her CT done yesterday  was a very technically difficult study because of her morbid obesity but  she did not have any evidence of infiltrate or pulmonary embolus.   EXAM:  This morning she is actually pretty sleepy this morning, which I  think is explained by her blood gas, which I will discuss in a minute.  Her chest is fairly clear.  Her heart is regular without gallop. Her blood pressure as mentioned is  better.   LAB WORK:  Cardiac panel thus far CK 311, troponin 0.07, but not  consistent with an acute MI.  Blood cultures are pending.  Urine culture  also pending.  CBC this morning shows white count 11,200, hemoglobin  11.5, platelets 253.  Her blood gas on 3 liters shows a pH is down to  7.27, pCO2 of 72, pO2 of 64.  Yesterday, her blood gas showed pH 7.39,  pCO2 of 47, pO2 of 66.   ASSESSMENT:  She has febrile illness.  Is not clear what the source of  this is.  It may well be that she is got a pneumonia, although it did  not show on her CT.  She has increased respiratory failure from  yesterday which I think is probably a problem with obesity  hypoventilation.  She has hypertension, which is better controlled.  Her  shortness of breath does not seem to be quite as bad this morning.   PLAN:  Then is to continue with treatments and medications and follow.  I am going to repeat blood gas later today.      Edward L. Juanetta Gosling, M.D.  Electronically Signed     ELH/MEDQ  D:  07/09/2007  T:  07/09/2007  Job:  [id number]

## 2010-10-03 NOTE — Consult Note (Signed)
[name], [name]              ACCOUNT NO.:  [id number]   MEDICAL RECORD NO.:  [id number]          PATIENT TYPE:  INP   LOCATION:  2007                         FACILITY:  [facility]   PHYSICIAN:  Darryl D. Prime, MD    DATE OF BIRTH:  [birth year]   DATE OF CONSULTATION:  DATE OF DISCHARGE:                                 CONSULTATION   ON-CALL NOTE   The patient has had a telemonitor artery device placed and it has been  beeping.  It beeped on 2 occasions over night.  She had a 3 second pause  the first time and second time was told she had a possible Mobitz Type  I.  I spoke to the patient, her phone number is [phone].  She notes  significant dizziness, presyncopal symptoms overnight and I told her to  have someone driver her to the emergency room, either or a family member  or by ambulance.  Preferably by ambulance and hopefully she will arrive  soon at [facility].      Darryl D. Prime, MD  Electronically Signed     DDP/MEDQ  D:  03/22/2007  T:  03/23/2007  Job:  [id number]

## 2010-10-03 NOTE — Discharge Summary (Signed)
NAMEJAKAIYA, [name]              ACCOUNT NO.:  [id number]   MEDICAL RECORD NO.:  [id number]          PATIENT TYPE:  INP   LOCATION:  A209                          FACILITY:  [facility]   PHYSICIAN:  Edward L. Juanetta Gosling, M.D.DATE OF BIRTH:  [birth year]   DATE OF ADMISSION:  07/08/2007  DATE OF DISCHARGE:  02/25/2009LH                               DISCHARGE SUMMARY   DATE OF TRANSFER:  Patient is being transferred to the [facility] today, July 16, 2007.   FINAL DISCHARGE DIAGNOSES:  1. Aspiration pneumonia with respiratory failure requiring ventilator      support.  2. Morbid obesity.  3. Obesity hypoventilation syndrome.  4. Asthma.  5. Hypertension.  6. Congestive heart failure on the basis of diastolic dysfunction of      the heart.  7. Diabetes.  8. Deconditioning.  9. Lack of venous access.   HISTORY:  [name] is a 55 year old who came to the emergency room  with the chief complaint of shortness of breath.  It had started early  on the day of admission.  She had an episode of nausea and vomiting and  her shortness of breath started after that.  It was not totally clear  she had aspirated at that point but it was felt that she probably had.  She was unable to keep her medications down and came to the emergency  room.  She had multiple nebulizer treatments, a D-dimer that was  elevated, a CT scan of the chest which did not show pulmonary emboli.   HOSPITAL COURSE:  She was felt to have aspirated and was started on  Unasyn intravenously and initially improved.  She was placed on BiPAP at  night because of obesity hypoventilation syndrome but continued to have  shortness of breath, continued to have difficulty with breathing and  eventually on the day after admission it became clear that she was going  to develop full-blown respiratory failure.  She did have elevated pCO2.  Because of that, she was intubated, semi-electively.  She was not in  acute  respiratory distress, but it was clear that she was going to tire  and have a respiratory arrest if she was not intubated at that point.  She was difficult to intubate. She had a very large tongue which made it  more difficult, in addition to her morbid obesity.  However, intubation  was established.  She was placed on mechanical ventilation.  She then  showed what appeared to be bilateral infiltrates that had been read on  the chest x-ray as congestive heart failure but which I believe are much  more likely representing aspiration pneumonia, especially considering  that she has a normal BNP.  She improved with treatment, remained on the  ventilator for about 48 to 72 hours and was able to be extubated and did  well after extubation.  She has been having physical therapy but it is  clear that she is significantly deconditioned in addition to her severe  problems with her obesity and she is being transferred to the [facility] to continue her  recovery from her severe medical  illness.  She has had a femoral central venous line which is being  discontinued.  She is discharged to the [facility]  on the following medications:   TRANSFER MEDICATIONS:  1. Lovenox 40 mg subcutaneously daily.  2. Lasix 40 mg p.o. daily.  3. Albuterol by  nebulizer 2.5 mg every 4 hours while awake and she      can have additional treatments every 2 hours as needed.  4. Norvasc 10 mg daily.  5. Clonidine 0.2 mg b.i.d.  6. Labetalol 300 mg every 8 hours.  7. Lisinopril 20 mg b.i.d.  8. Simvastatin 80 mg at bedtime.  9. She is going to have Accu-Chek's a.c. and h.s. and she is going to      be on the sliding scale per protocol with insulin resistance      protocol.  10.Potassium chloride 20 mEq four times a day.  11.Protonix 40 mg daily.  12.She has been on Unasyn but she can be switched to Augmentin 875 mg      b.i.d. x7 more days  13.Tylenol 650 mg p.o. q.4h. p.r.n. pain  or fever.  14.Zofran 4 mg p.o. q.4h. p.r.n. nausea and vomiting.  15.Darvocet-N 100 one p.o. q.4h. p.r.n. pain.   PLAN:  The plan is for her to be discharged home after rehabilitation  with home health services.      Edward L. Juanetta Gosling, M.D.  Electronically Signed     ELH/MEDQ  D:  07/16/2007  T:  07/16/2007  Job:  [id number]

## 2010-10-03 NOTE — Consult Note (Signed)
NAMEJARETSSI, [name]              ACCOUNT NO.:  [id number]   MEDICAL RECORD NO.:  [id number]          PATIENT TYPE:  INP   LOCATION:  IC09                          FACILITY:  [facility]   PHYSICIAN:  Gerrit Friends. Dietrich Pates, MD, FACCDATE OF BIRTH:  1955/07/20   DATE OF CONSULTATION:  05/26/2007  DATE OF DISCHARGE:                                 CONSULTATION   REFERRING PHYSICIAN:  Dr. Felecia Shelling.   PRIMARY CARE PHYSICIAN:  Dr. Juanetta Gosling.   PRIMARY CARDIOLOGIST:  Dr. Graciela Husbands.   HISTORY OF PRESENT ILLNESS:  A 55 year old woman admitted to the  hospital with dyspnea and hypertension out of control.  [name] is  known to our group as a result of sick sinus syndrome.  She underwent  pacemaker implantation in early November of last year.  Cardiac  catheterization at that time was normal.  She has no definite history of  congestive heart failure.  She did have some nonsustained ventricular  tachycardia while she was monitored.  Additionally, she has as a history  of severe hypertension, morbid obesity, mild diabetes and arthritis -  probably osteoarthritis, but previously characterized as rheumatoid  arthritis.  She has done well since pacemaker implantation, but  presented to the emergency department with a few hours of dyspnea and  weakness.  Blood pressure was found to be very high.  With initial  therapy, symptoms have resolved, and blood pressure has improved.  Myocardial infarction has been ruled out.  She had a high D-dimer level,  but CT scan of the chest was negative for pulmonary embolism.  There was  no pulmonary edema.   MEDICATIONS:  During a recent office visit included:  1. Furosemide 40 mg b.i.d.  2. Hydralazine 100 mg q.i.d.  3. Clonidine 0.2 mg b.i.d.  4. Labetalol 300 mg t.i.d.  5. Amlodipine 10 mg daily.  6. Lisinopril 40 mg daily.  7. KCl 20 mEq b.i.d..  8. Simvastatin 80 mg daily.   She is essentially on the same medications at present except for  lisinopril.   Past  medical, social, family history, and review of systems were  updated.  No notable changes are identified.   PHYSICAL EXAMINATION:  Pleasant, obese woman in no acute distress.  The  blood pressure is 145/85, heart rate 75 and regular, respirations 18.  Temperature 37, weight 173.4 kg.  I&Os are positive 1 liter since  admission.  HEENT:  EOMs full; normal oral mucosa.  NECK:  No jugular venous distention; no carotid bruits.  LUNGS:  Decreased breath sounds at the bases; otherwise clear.  CARDIAC:  Normal first heart sounds; slightly prominent splitting of the  second heart sound; no gallop appreciated.  ABDOMEN:  Soft and nontender; no organomegaly.  EXTREMITIES:  Considerable soft tissue distally but no edema; distal  pulses intact.  NEUROMUSCULAR:  Symmetric strength and tone; normal  cranial nerves.   EKG:  Normal sinus rhythm; delayed R-wave progression - cannot exclude  prior septal myocardial infarction; otherwise unremarkable.   Laboratory studies are benign except for the D-dimer level previously  noted.  CBC, chemistry profile and cardiac markers  are negative.   IMPRESSION:  [name] presents with hypertensive urgency and dyspnea  in the absence of pulmonary edema.  It is unclear whether she first  suffered an exacerbation of her chronic lung problems resulting in  elevation of blood pressure or whether hypertension out of control was  the primary event.  In any case, she is markedly improved with therapy  for both of these entities.  There appears to be no reason to  dramatically change her antihypertensive regimen.  I will make small  changes to simplify her therapy and plan to follow this nice woman in  the office.  We greatly appreciate the request for consultation and will  be most happy to follow this nice woman with you.      Gerrit Friends. Dietrich Pates, MD, [facility]  Electronically Signed     RMR/MEDQ  D:  05/26/2007  T:  05/27/2007  Job:  [id number]

## 2010-10-03 NOTE — H&P (Signed)
NAMELENORA, GOMES              ACCOUNT NO.:  [id number]   MEDICAL RECORD NO.:  [id number]          PATIENT TYPE:  INP   LOCATION:  A215                          FACILITY:  [facility]   PHYSICIAN:  Edward L. Juanetta Gosling, M.D.DATE OF BIRTH:  [birth year]   DATE OF ADMISSION:  07/08/2007  DATE OF DISCHARGE:  LH                              HISTORY & PHYSICAL   REASON FOR ADMISSION:  Shortness of breath.   HISTORY:  Ms. Ivey is a 55 year old with a chief complaint of  shortness of breath.  She said that it started on the day prior to  admission.  She has had episodes of nausea and vomiting.  It is not  clear if she aspirated or not, but her shortness of breath seemed to  start after that.  She has not been able to keep her medications down,  and she is on a very large number of medicines to try to get her blood  pressure under control.  Since she had been in the emergency room, she  has had multiple nebulizer treatments.  She has had a D-dimer done that  was positive, then had a CT of the chest which did not show pulmonary  emboli.   PAST MEDICAL HISTORY:  1. Asthma.  2. Congestive heart failure, which is on the basis of  __________  .   PHYSICAL EXAMINATION:  GENERAL:  She appeared to be in some distress  with her respirations.  HEENT:  Her pupils are reactive.  Her nose and throat were clear.  CHEST:  Showed decreased breath sounds.  HEART:  Regular without murmur, gallop, or rub.  ABDOMEN:  Soft.  No masses felt.  EXTREMITIES:  She had chronic venous stasis, with chronic brawny edema.  CNS:  Grossly intact.   ASSESSMENT:  She has hypertension, which is not controlled.  She has had  an episode of vomiting.  She may well have had aspiration because she is  so short of breath.   PLAN:  Admit her to the hospital.  Put her on antibiotics.  Give her  some nebulizer treatments.  Try to get her back on her medications for  her blood pressure, see if we can get her pressure under control,  and  monitor her.  I am going to go ahead and check cardiac enzymes, although  she did have the cardiac catheterization that appeared to be okay.      Edward L. Juanetta Gosling, M.D.  Electronically Signed     ELH/MEDQ  D:  07/08/2007  T:  07/09/2007  Job:  [id number]

## 2010-10-03 NOTE — H&P (Signed)
NAMEChanda, [name]              ACCOUNT NO.:  [id number]   MEDICAL RECORD NO.:  [id number]          PATIENT TYPE:  INP   LOCATION:  IC10                          FACILITY:  [facility]   PHYSICIAN:  Edward L. Juanetta Gosling, M.D.DATE OF BIRTH:  Jan 04, 1956   DATE OF ADMISSION:  07/08/2007  DATE OF DISCHARGE:  LH                              HISTORY & PHYSICAL   ADDENDUM   FAMILY HISTORY:  [name] has a family history of multiple family  members with marked edema of the legs, very positive for diabetes,  positive for hypertension.   SOCIAL HISTORY:  She does not smoke.  She does not drink any alcohol.  She does not use any illicit drugs.   REVIEW OF SYSTEMS:  Her review of systems other than as previously  dictated is essentially negative.      Edward L. Juanetta Gosling, M.D.  Electronically Signed     ELH/MEDQ  D:  07/09/2007  T:  07/09/2007  Job:  [id number]

## 2010-10-03 NOTE — H&P (Signed)
[name], [name]              ACCOUNT NO.:  [id number]   MEDICAL RECORD NO.:  [id number]          PATIENT TYPE:  INP   LOCATION:  2007                         FACILITY:  [facility]   PHYSICIAN:  Madolyn Frieze. Jens Som, MD, FACCDATE OF BIRTH:  04-26-1956   DATE OF ADMISSION:  03/22/2007  DATE OF DISCHARGE:                              HISTORY & PHYSICAL   The patient is a 55 year old female with past medical history of morbid  obesity, diabetes mellitus, hypertension, congestive heart failure whom  I am asked to evaluate for chest pain and syncope.  Note:  I do have  records from a brief evaluation in 2004.  At that time, she had a  Myoview that showed ejection fraction of 63%.  There was breast  attenuation, but there was no ischemia or scar.  She does carry a  diagnosis of congestive heart failure.  I also have a Discharge Summary  from January 25, 2006, which said that at night during sleep, her heart  rate dropped into the 20s when she was at [facility].  At that  time, verapamil was discontinued.  The patient has chronic dyspnea on  exertion as well as orthopnea, PND, and pedal edema.  She also has a  long history of chest pain.  The pain is in the substernal area  described as a pressure.  It is not exertional nor is it pleuritic or  positional.  Is not related to food.  There is associated shortness of  breath, but there is no nausea, vomiting or diaphoresis.  It can last up  to an hour at a time.  The patient recently is being evaluated by Dr. Shaune Pollack for syncope.  The patient apparently will have sudden loss of  consciousness for several seconds.  Afterwards she feels bad.  There  is no associated chest pain, shortness of breath or nausea, vomiting.  There is no seizure activity, weakness, loss of sensation in  extremities, nor is there any incontinence.  She occasionally feels  palpitations.  She does state that she feels a sudden unusual warm  feeling in her chest  that radiates up to her neck prior to these  episodes.  This was initially felt to be secondary to hypoglycemia.  However, glucose has been checked and has been greater than 100 on some  of these episodes by her report.  Dr. Juanetta Gosling recently placed a monitor,  and Dr. Graciela Husbands was contacted last evening.  She apparently was told her  heart stops.  I do not have strips or communication with Dr. Graciela Husbands.  He asked her to come to the hospital today for evaluation.  She is  unclear about the dose of her medications, but she does state that she  takes Lopressor, clonidine, and Lasix.  She has no known drug allergies.   SOCIAL HISTORY:  She does not smoke nor does she consume alcohol.   FAMILY HISTORY:  Is positive for coronary disease.  Her mother died of  myocardial infarction at age 21.   PAST MEDICAL HISTORY:  Significant for diabetes mellitus and  hypertension,  but there is no hyperlipidemia.  She has a history of  asthma.  She has a history of morbid obesity.  There is a probable sleep  apnea.  She has a history of diabetic neuropathy.  There is a  questionable history of rheumatoid arthritis.  She had a previous  hysterectomy.   REVIEW OF SYSTEMS:  She denies any headaches or fevers or chills.  There  is no productive cough or hemoptysis.  There is no dysphagia,  odynophagia, melena or hematochezia.  There is no dysuria or hematuria.  There is no rash or seizure activity.  There is orthopnea and PND as  well as pedal edema.  She does have some pain in her legs when she  ambulates.  The remaining systems are negative.   PHYSICAL EXAMINATION:  VITAL SIGNS:  Exam today shows that her heart  rate is 71.  GENERAL:  She is morbidly obese.  She is in no acute distress at  present.  Skin is warm and dry, although there is a rash under her  panus.  She does not appear depressed.  There is no peripheral clubbing.  Back is normal.  HEENT:  Normal with normal eyelids.  NECK:  Supple with normal  upstroke bilaterally.  No bruits noted.  There  is no jugular distention, and no thyromegaly is noted.  CHEST:  Clear to auscultation with normal expansion.  CARDIOVASCULAR:  Heart sounds are distant.  She has a regular rate and  rhythm with normal S1-S2.  I cannot appreciate murmurs, rubs or gallops.  ABDOMEN:  Exam is very difficult due to obesity.  There is no  tenderness, nor can I appreciate masses.  I cannot appreciate  hepatosplenomegaly.  Her femoral pulses are not palpable due to obesity.  EXTREMITIES:  Show trace to 1+ edema bilaterally.  They are morbidly  obese.  I cannot appreciate cords, although the exam is difficult.  She  has 2+ dorsalis pedis pulses bilaterally.  NEUROLOGIC:  Exam is grossly intact.   Her electrocardiogram shows a sinus rhythm at a rate of 70.  The axis is  normal.  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  There is a first-  degree AV block and nonspecific ST changes noted.   DIAGNOSES:  1. Syncope.  The patient is having syncopal episodes that are      concerning in description for possible bradycardia or arrhythmia.      She apparently had a monitor, and Dr. Welton Flakes asked her to come to the      hospital for admission and possible pacemaker.  We will obtain      those strips for review.  In the meantime given that she was told      her heart stops, we will need to hold her Lopressor and wean her      clonidine to off.  She may ultimately require pacemaker after      review of her rhythm strips.  2. Chronic dyspnea.  We will check an echocardiogram to quantify left      ventricular function.  3. Chest pain.  Given her risk factors and the fact that she may      consider gastric bypass surgery in the future, I have recommended      cardiac catheterization for definitive evaluation.  This is      particularly in light of her size and the possibility for soft      tissue attenuation affecting her nuclear scan.  However, she      refuses this stating  that she is afraid of this procedure.  We      will, therefore, begin with a dobutamine Myoview.  If this is      abnormal, then she will consider cardiac catheterization.  4. Diabetes mellitus.  We will check CBGs.  5. Hypertension.  Will need to follow her blood pressure closely and      evaluate for possible rebound hypertension.  We will adjust her      regimen as indicated, and we will avoid AV nodal blocking agents.      Madolyn Frieze Jens Som, MD, [facility]  Electronically Signed     BSC/MEDQ  D:  03/22/2007  T:  03/23/2007  Job:  [id number]

## 2010-10-03 NOTE — Letter (Signed)
June 13, 2007    Edward L. Juanetta Gosling, M.D.  [address]  [place], [place] [address]   RE:  GUY, TONEY  MRN:  [id number]  /  DOB:  [birth year]   Dear Ed:   Ms. Cederberg returns the office following a recent admission to [facility] where cardiac catheterization fortunately revealed no coronary  disease.  She has done well since without recurrent symptoms.  She is  planning to see a dietician, but has not been able to lose weight over  quite a few years.  When we first saw her in 2001, her weight was 324.  It is now 361.   Current medications include furosemide 40 mg b.i.d., labetalol 300 mg  t.i.d., amlodipine 10 mg daily, KCl 20 mEq b.i.d., simvastatin 80 mg  daily, hydralazine 100 mg b.i.d., clonidine 0.3 mg b.i.d., lisinopril 20  mg b.i.d., glipizide 5 mg daily.   EXAM:  Obese woman in no acute distress.  The blood pressure was initially measured 220/105, probably with an  improper cuff.  The subsequent value was 170/95.  Heart rate 72 and  regular.  NECK:  No jugular venous distention.  LUNGS:  Clear with decreased breath sounds at the bases.  CARDIAC:  Distant first and second heart sounds.  ABDOMEN:  Soft and nontender; no organomegaly.  EXTREMITIES:  Marked increase in the subcutaneous tissue; no marked  pedal edema.   IMPRESSION:  Ms. Carol has inadequate but not terrible control of  hypertension.  We will stop hydralazine.  She is experiencing adverse  reactions to clonidine, which will also be stopped.  Catapres TTS level  III will be substituted.  Aliskiren 150 mg daily will be added to her  regimen.  We will increase labetalol to 600 mg b.i.d.  We have no record  of a prior lipid profile.  She will hold simvastatin and check lipids in  1 month.  We discussed the possibility of surgical  intervention for obesity.  She has been opposed to this due to fear  concerning adverse effects.  She will attend an introductory session and  continue to consider  whether such treatment would be appropriate.  We  will follow potassium closely on a aliskiren and decrease her daily  potassium dose to 20 mEq.  I will see this nice woman again in 1 month.    Sincerely,      Gerrit Friends. Dietrich Pates, MD, [facility]  Electronically Signed    RMR/MedQ  DD: 06/13/2007  DT: 06/13/2007  Job #: [id number]

## 2010-10-03 NOTE — Group Therapy Note (Signed)
NAMEREMI, RESTER              ACCOUNT NO.:  [id number]   MEDICAL RECORD NO.:  [id number]          PATIENT TYPE:  INP   LOCATION:  A209                          FACILITY:  [facility]   PHYSICIAN:  Edward L. Juanetta Gosling, M.D.DATE OF BIRTH:  05-30-1955   DATE OF PROCEDURE:  DATE OF DISCHARGE:                                 PROGRESS NOTE   Problems include pneumonia, probably aspiration, morbid obesity,  diabetes, severe difficult to control hypertension.  Chronic respiratory  failure on the basis of obesity hypoventilation.  History of pacemaker  for bradycardia and syncope related to that.  Also deconditioning.  Ms.  Detjen says that she had a pretty good night last night.  She does not  have any great deal of difficulty and seems to be feeling a little  better.  She did cooperate to some extent with physical therapy  yesterday.  She was evaluated for potential rehabilitation at the [facility] and I am awaiting their decision.   PHYSICAL EXAMINATION:  This morning shows temperature is 99, pulse 81,  respirations 18, blood pressure 146/108.  Her blood pressure has been  much better than that in general.  Her O2 sat is 94% on 2 liters.  Her  blood sugars 136.  Her chest is much clearer.  Her heart is regular.  Her abdomen soft.  Her obesity of course is unchanged.   ASSESSMENT:  She has multiple medical problems.  We are hoping that she  will be able to be transferred to [facility] to hopefully  allow [place] to improve her overall situation.      Edward L. Juanetta Gosling, M.D.  Electronically Signed     ELH/MEDQ  D:  07/15/2007  T:  07/15/2007  Job:  [id number]

## 2010-10-03 NOTE — Cardiovascular Report (Signed)
[name], MCDONNELL              ACCOUNT NO.:  [id number]   MEDICAL RECORD NO.:  [id number]          PATIENT TYPE:  INP   LOCATION:  2007                         FACILITY:  [facility]   PHYSICIAN:  Veverly Fells. Excell Seltzer, MD  DATE OF BIRTH:  1955/08/16   DATE OF PROCEDURE:  03/27/2007  DATE OF DISCHARGE:                            CARDIAC CATHETERIZATION   PROCEDURE:  Left heart catheterization, selective coronary angiography,  left ventricular angiography, abdominal aortic angiography.   INDICATIONS:  [name] is a 55 year old morbidly obese woman who has  had nonsustained ventricular tachycardia, as well as marked bradycardia  with syncope.  She has severe hypertension.  She was referred for  cardiac catheterization and renal angiography to evaluate for potential  etiologies of her dysrhythmia, as well as her severe hypertension.   Risks and indications of procedure were reviewed with the patient.  Informed consent was obtained.  The right radial area was prepped and  draped with normal sterile technique.  Using a micropuncture kit, a 5-  [place] sheath was placed in the right radial artery, and a radial  cocktail was given that consisted of 2.5 mg verapamil, 2000 units of  heparin and 200 mcg of nitroglycerin.  A 5-French JL-4 and 5-French JR-4  catheter were used for selective coronary angiography.  There was severe  tortuosity in the right subclavian and innominate arteries which made  catheter manipulation very difficult.  A pigtail catheter was inserted  in the left ventricle and left ventriculography was performed.  With  some difficulty, I was able to pass a JR-4 catheter and wooly wire down  into the descending aorta, and this was changed out over an exchange  length Rosen wire for a pigtail catheter, and abdominal aortography was  performed.  This demonstrated widely patent renal arteries, and I did  not perform selective renal angiography.  At the completion of the  procedure,  all catheters were removed, and the patient tolerated the  procedure well.  There were no immediate complications.  All catheter  exchanges were performed over a guidewire.   FINDINGS:  Aortic pressure 121/83 with a mean of 102, left ventricular  pressure 123/13.   CORONARY ANGIOGRAPHY:  The right coronary artery is a very large vessel.  It is angiographically normal.  It is tortuous in the midportion.  It  supplies a large posterolateral branch and a large PDA branch.  There is  no significant angiographic stenosis throughout the right coronary  artery.   The left main coronary artery is widely patent.  It trifurcates into the  LAD intermediate branch and left circumflex.   The LAD is a large caliber vessel that courses down and reaches the LV  apex.  It supplies two large diagonal branches.  There is no significant  angiographic disease in the LAD.   The ramus intermedius is a large-caliber vessel.  It is widely patent  and has no significant angiographic disease.   The left circumflex is a large-caliber vessel that supplies a large  first OM, as well as a large second OM branch.  There is an atrial  branch, as well.  There is no significant angiographic stenosis in the  left circumflex.   Left ventricular function is hyperdynamic.  The LVEF is estimated at  75%.   Abdominal aortography demonstrates a widely patent abdominal aorta.  There are dual renal arteries on the right, both from near the same  origin.  The right renal arteries are widely patent.  There is a single  left renal artery, and it is widely patent, as well.   ASSESSMENT:  1. Normal coronary arteries.  2. Hyperdynamic left ventricular systolic function.  3. Normal renal arteries.  4. Normal abdominal aorta.   [name] will be evaluated for a permanent pacemaker.  She does not  have any significant coronary artery disease.      Veverly Fells. Excell Seltzer, MD  Electronically Signed     MDC/MEDQ  D:   03/27/2007  T:  03/27/2007  Job:  [id number]   cc:   Madolyn Frieze. Jens Som, MD, [facility]

## 2010-10-03 NOTE — Group Therapy Note (Signed)
NAMENela, Bascom Burgandy              ACCOUNT NO.:  [id number]   MEDICAL RECORD NO.:  [id number]          PATIENT TYPE:  INP   LOCATION:  IC10                          FACILITY:  [facility]   PHYSICIAN:  Edward L. Juanetta Gosling, M.D.DATE OF BIRTH:  08-11-1955   DATE OF PROCEDURE:  07/09/2007  DATE OF DISCHARGE:                                 PROGRESS NOTE   Ms. Sherburne had more problems through the day with pCO2 going up.  We  tried BiPAP, which did not work. So and it was clear she was not  improving and she was very sleepy. I did give her a dose of Narcan to  see if perhaps it was a latent effect of the morphine that she had  received for pain.  It seemed to make no difference and I went ahead and  semi-electively intubated her.  The intubation was difficult because she  has a very large tongue.  Chest x-ray post intubation is pending and her  blood gas, etcetera are pending.  She was intubated on the third  attempt.  She had some bleeding from her tongue post intubation, but  otherwise is stable.  Blood pressure is excellent. Chest x-ray as  mentioned is pending.      Edward L. Juanetta Gosling, M.D.  Electronically Signed     ELH/MEDQ  D:  07/09/2007  T:  07/10/2007  Job:  [id number]

## 2010-10-03 NOTE — Group Therapy Note (Signed)
[name], ARRAS              ACCOUNT NO.:  [id number]   MEDICAL RECORD NO.:  [id number]          PATIENT TYPE:  INP   LOCATION:  A209                          FACILITY:  [facility]   PHYSICIAN:  Edward L. Juanetta Gosling, M.D.DATE OF BIRTH:  16-Aug-1955   DATE OF PROCEDURE:  DATE OF DISCHARGE:                                 PROGRESS NOTE   PROBLEMS:  Include aspiration pneumonia, sleep apnea, morbid obesity,  chronic respiratory failure, hypertension, diabetes, status post  pacemaker for symptomatic bradycardia.   SUBJECTIVE:  Ms. [name] seems to be doing much better in general.  She  has no new complaints and says she slept well.   PHYSICAL EXAM:  Shows her heart rates in the 70s, blood pressure about  150 which is excellent blood pressure for her.  Her lab work and blood  cultures are now back.  Final is negative.  Blood gas this morning on 3  liters of O2, pH 7.42, pCO2 of 53, pO2 of 84.  Her BMET shows potassium  still slightly low at 3.3, CO2 is 36 suggesting her chronic CO2  retention, BUN is 9, creatinine 0.86.  Her white count 8200, hemoglobin  is 9.9 with normal indices so I think it may well be related to chronic  disease, and with her next blood draw going to get anemia profile.  Her  heart rate has been okay.  She is actually overriding the pacer right  now.  Her chest is much clearer.  Her abdomen soft obese without masses.  Extremities showed chronic venous stasis changes.   ASSESSMENT:  I think she is has improved enough to be discharged from  the ICU and I will plan to move her today.      Edward L. Juanetta Gosling, M.D.  Electronically Signed     ELH/MEDQ  D:  07/13/2007  T:  07/13/2007  Job:  [id number]

## 2010-10-03 NOTE — Group Therapy Note (Signed)
NAMEJEANEEN, CALA              ACCOUNT NO.:  [id number]   MEDICAL RECORD NO.:  [id number]          PATIENT TYPE:  INP   LOCATION:  A209                          FACILITY:  [facility]   PHYSICIAN:  Edward L. Juanetta Gosling, M.D.DATE OF BIRTH:  Aug 23, 1955   DATE OF PROCEDURE:  DATE OF DISCHARGE:                                 PROGRESS NOTE   Ms. Drozdowski is better today.  She says that everything is going fairly  well.  She is complaining of some gout and her toe is somewhat swollen.  Otherwise she says she is still weak.  She said yesterday she was not  unable to stand.   PHYSICAL EXAMINATION:  VITALS:  Shows her temperature is 99, pulse 89,  respirations 20, blood pressure 150/88, O2 sats 98% on 3 liters.  Her  blood sugars 149.  CHEST:  Clearer.  She still has decreased breath sounds on the basis of  her morbid obesity.  Her morbid obesity of course is unchanged.  Her  blood sugar is pretty good.  EXTREMITIES:  Her foot is somewhat swollen.  She has chronic venous  stasis changes as well.   ASSESSMENT:  She has aspiration pneumonia which is improving.  She has  chronic respiratory failure which I suspect is on the basis of obesity  hypoventilation. She probably has sleep apnea.  She is having an episode  of probable gout.  She has diabetes which is well-controlled.  She has  hypertension which is very well controlled for her and she is weak so I  am going to try to get her up and moving.  I do not plan any other new  treatments today.      Edward L. Juanetta Gosling, M.D.  Electronically Signed     ELH/MEDQ  D:  07/14/2007  T:  07/14/2007  Job:  [id number]

## 2010-10-03 NOTE — Discharge Summary (Signed)
[name], [name]              ACCOUNT NO.:  [id number]   MEDICAL RECORD NO.:  [id number]          PATIENT TYPE:  INP   LOCATION:  2007                         [facility]ACILITY:  [facility]   PHYSICIAN:  [name], MDDATE OF BIRTH:  04-Oct-1955   DATE OF ADMISSION:  03/22/2007  DATE OF DISCHARGE:  03/30/2007                               DISCHARGE SUMMARY   PRIMARY CARDIOLOGIST:  [name] Salvia, MD, [facility].   PRIMARY CARE [name]:  Oneal Deputy. [name], M.D.   DISCHARGE DIAGNOSIS:  Syncope.   SECONDARY DIAGNOSES:  1. Bradycardia with pauses greater than 3 seconds.  2. Nonsustained ventricular tachycardia.  3. Severe hypertension.  4. Morbid obesity.  5. Probable obstructive sleep apnea.  6. Type 2 diabetes mellitus.  7. Normal coronary arteries by cardiac catheterization.  8. Normal left ventricular function.  9. Reported history of congestive heart failure.  10.History of asthma.  11.Osteoarthritis.  12.Diabetic neuropathy and pain.  13.Questionable history of rheumatoid arthritis.  14.Status post hysterectomy.   ALLERGIES:  No known drug allergies.   PROCEDURES:  Left heart cardiac catheterization revealing normal  coronary arteries.  Successful placement of a Medtronic Sensia serial  number [id number] dual-chamber permanent pacemaker.   HISTORY OF PRESENT ILLNESS:  This is a 55 year old obese African  American female with prior history of presyncope and syncope followed by  [name] in [place].  She apparently had an Event monitor placed  and was noted to have greater than 3-second pauses with heart rates into  the 30s and 40s during periods of sleep.  On November 1, her on call  physician was notified of such a pause and when the patient was  contacted she reported dizziness and presyncope and she was advised to  present to the [facility] ED for further evaluation.   HOSPITAL COURSE:  The patient was admitted and initially beta blocker  therapy was held while we  waited for transmission of monitoring strips  from Dr. Zadie [name] office.  We were finally able to obtain these records  on November 3 from [facility] showing second-degree type 1 and 2 heart  block occurring in the early morning hours during sleep.  A 2-D  echocardiogram was performed on November 3 revealing an EF of 75%  without wall motion abnormalities.  In the setting of syncope with  bradycardia and multiple cardiac risk factors, we initially recommended  dobutamine Myoview testing; however, the patient was unable to tolerate  laying in the scanner.  We therefore recommended cardiac catheterization  which initially she refused.  Off of beta blocker therapy, she did not  have any significant pauses on telemetry.  However, she was noted to  have symptomatic nonsustained ventricular tachycardia.  At this point  electrophysiology was consulted, and they recommended reinitiation of  low dose beta blocker therapy, as well as cardiac catheterization to  rule out coronary artery disease.  As she would require beta blocker  therapy for treatment of symptomatic nonsustained ventricular  tachycardia, electrophysiology also recommended placement of a permanent  pacemaker.  After much discussion, patient was finally agreeable to  cardiac catheterization  which took place on November 6.  This revealed  normal coronary arteries with an EF of 75%.  She had widely patent renal  arteries as well.  Discussion then turned to placement of permanent  pacemaker, and patient was agreeable to this as well.  She was taken to  the EP lab on November 8 and underwent the successful placement of a  Medtronic [place] [id number] dual-chamber permanent pacemaker.  She tolerated  this procedure well and has been educated on post pacemaker limitations  and expectations.   Throughout [name] admission, we have had difficulty controlling her  blood pressure.  We have added multiple medications including beta  blocker,  calcium channel blocker, ACE inhibitor, hydralazine and  clonidine therapy.  We now have fairly adequate control of her blood  pressure.   Because her pauses occurred during periods of sleep, with her morbid  obesity, we have obviously recommended weight loss, but also have  recommended followup with [name] for referral for sleep study to  assess obstructive sleep apnea.  It seemed likely the patient would  benefit from CPAP.   DISCHARGE LABS:  Hemoglobin 10.6, hematocrit 32, WBC 7.7, platelets 252,  MCV 84.2, sodium 139, potassium 3.6, chloride 100, CO2 31, BUN 8,  creatinine 27, glucose 144, total bilirubin 0.4, alkaline phosphatase  81, AST 27, ALT 20, albumin 3.9, calcium 8.3, magnesium 1.9, urinalysis  was negative, TSH 2.2.   DISPOSITION:  Patient is being discharged home today in good condition.   FOLLOWUP PLANS AND APPOINTMENTS:  She is asked to follow up with Dr.  Juanetta [name] in 3-4 weeks.  Patient is requesting a scooter as well as a  hospital bed, and we have deferred this decision to [name].  We  will contact her for followup in our pacer clinic in [place] in  approximately 10 days.  She will also have followup with [name] in  approximately 3 months.   DISCHARGE MEDICATIONS:  1. K-Dur 20 mEq b.i.d.  2. Norvasc 10 mg daily.  3. Lisinopril 40 mg daily.  4. Aspirin 81 mg daily.  5. Zocor 40 mg nightly.  6. Lasix 40 mg b.i.d.  7. Hydralazine 100 mg q.i.d.  8. Clonidine 0.2 mg b.i.d.  9. Labetolol 300 mg t.i.d.  10.Glipizide 5 mg daily.  11.Celebrex 200 mg daily.   OUTSTANDING LAB STUDIES:  None.   DURATION OF DISCHARGE ENCOUNTER:  60 minutes including physician time.      [name], ANP      [name] Buckles. Bensimhon, MD  Electronically Signed    CB/MEDQ  D:  03/30/2007  T:  03/30/2007  Job:  [id number]   cc:   [name], M.D.

## 2010-10-03 NOTE — Assessment & Plan Note (Signed)
[facility]                         ELECTROPHYSIOLOGY OFFICE NOTE   NAME:[name], [name]                     MRN:          [id number]  DATE:11/26/2007                            DOB:          30-Jul-1955    [name] returns today for followup.  She is a very pleasant, morbidly  obese, middle-aged woman with a history of intermittent complete heart  block who has underwent permanent pacemaker insertion back in November.  She returns today for followup.  She denies chest pain.  She does admit  to some continued and ongoing peripheral edema.  She denies chest pain.  She has no significant dyspnea about her baseline.  The patient has had  no syncopal spells since her pacemaker was placed.   CURRENT MEDICATIONS:  1. Lasix 40 twice daily.  2. Amlodipine 10 a day.  3. Potassium supplement 20 twice daily.  4. Lisinopril 20 twice daily.  5. Glipizide 5 daily.  6. Labetalol 600 twice daily.   PHYSICAL EXAMINATION:  GENERAL;  She is a pleasant, morbidly obese,  middle-aged woman, in no acute distress.  VITAL SIGNS:  Blood pressure today was 110/72, the pulse 66 and regular,  respirations were 18, and the weight was 293 pounds.  NECK:  No jugular  venous distention.  No thyromegaly.  LUNGS:  Clear bilaterally to auscultation.  No wheezes, rales, or  rhonchi are present.  CARDIOVASCULAR:  Distant with a regular rate and rhythm with normal S1  and S2.  EXTREMITIES:  Demonstrate 3+ woody peripheral edema.   Interrogation of her pacemaker demonstrates a Medtronic [place], P-waves  were greater than 2, the R-waves were 16, the impedance 633 in the A and  824 in the V, and the threshold 0.875 at 0.4 in the atrium and 0.625 at  0.4 in the RV.  Battery voltage was 2.79 volts.  There were six mode  switches less than 0.1% of the time.  She was 15% V-paced and 4% A-  paced.   IMPRESSION:  1. Intermittent complete heart block.  2. Morbid obesity.  3.  Hypertension.  4. Status post pacemaker insertion.   DISCUSSION:  Overall, [name] is stable.  Her blood pressure is well  controlled.  Her peripheral edema is worrisome, and I have asked that  she stop her amlodipine though we may  to switch her to another medication like oral clonidine.  I will plan to  see the patient back in  November 2009.  She is instructed call us for additional problems.  We  will refer her to a dietician at [facility] for dietary counsel  for weight loss.     Doylene Canning. Ladona Ridgel, MD  Electronically Signed    GWT/MedQ  DD: 11/26/2007  DT: 11/27/2007  Job #: [id number]

## 2010-10-03 NOTE — Group Therapy Note (Signed)
[name]              ACCOUNT NO.:  [id number]   MEDICAL RECORD NO.:  [id number]          PATIENT TYPE:  INP   LOCATION:  IC09                          FACILITY:  [facility]   PHYSICIAN:  Edward L. Juanetta Gosling, M.D.DATE OF BIRTH:  04-Jan-1956   DATE OF PROCEDURE:  DATE OF DISCHARGE:                                 PROGRESS NOTE   She does not have any new problems.  She is not having any chest  tightness. She does not have any shortness of breath.  Her blood  pressure 150/90 and 130/80s or so.  Otherwise she has no new complaints.  Her chest is clear.  Her heart is regular.  She is morbidly obese.  Lab  work this morning.  Cardiac panels negative for infarction.   ASSESSMENT:  She is better.   PLAN:  To move her from the ICU. I think she is going to be able to stop  the nitroglycerin drip.  Will see what sort of blood pressure control we  can get from there.  She does have some interest in obesity surgery,  and I would certainly like to see her able to lose some weight.  I think  it would make a great deal of difference in her health, and she is also  has some interest in applying for social security disability, which I  also think is probably appropriate. __________ .      Edward L. Juanetta Gosling, M.D.  Electronically Signed     ELH/MEDQ  D:  05/27/2007  T:  05/27/2007  Job:  [id number]

## 2010-10-03 NOTE — Consult Note (Signed)
[name], [name]              ACCOUNT NO.:  [id number]   MEDICAL RECORD NO.:  [id number]          PATIENT TYPE:  INP   LOCATION:  2007                         FACILITY:  [facility]   PHYSICIAN:  Doylene Canning. Ladona Ridgel, MD    DATE OF BIRTH:  [birth year]   DATE OF CONSULTATION:  03/25/2007  DATE OF DISCHARGE:                                 CONSULTATION   REFERRING PHYSICIAN:  Dr. Olga Millers.   REASON FOR CONSULTATION:  Evaluation and treatment of nonsustained VT in  the setting of bradycardia and intermittent heart block.   HISTORY OF PRESENT ILLNESS:  The patient is a very pleasant, morbidly  obese, diabetic, hypertensive woman who has a history of chest pain in  the past as well as syncope in the past.  Her records date back several  years.  She had a negative stress test in 2004 with preserved LV  function.  She was first documented to have bradycardia at nighttime  with heart rates down in the 20s while she was hospitalized back in  September 2007.  At that time, her calcium channel blockers were  discontinued.  She has chronic orthopnea, dyspnea and PND.  She has also  had chronic peripheral edema for several years.  The patient's chest  pain is described as a chest pressure and it is not related to a certain  position and not related to breathing.  She has associated shortness of  breath.  These episodes have lasted as long as an hour in duration.  The  patient also describes a sensation of palpitations which occur from time  to time lasting much shorter in duration typically a minute or so.  She  gets dizzy with these episodes.  She also feels warm. In the past,  she  has described these as being related to hypoglycemia.  The patient was  admitted to the hospital after a cardiac monitor demonstrating transient  episodes of 2:1 heart block as well as AV Wenckebach with pauses of up  to 3 seconds.  She was hospitalized and her clonidine was decreased and  her beta blocker initially  stopped.  However, the patient subsequently  developed episodes of nonsustained VT at rates of up to 200 beats per  minute.  With this she felt palpitations and lightheadedness.   SOCIAL HISTORY:  The patient denies tobacco or alcohol abuse.   FAMILY HISTORY:  Notable for her mother dying of an MI at age 40.  No  other significant family history was noted.   ADDITIONAL PAST MEDICAL HISTORY:  Notable for probable but never  diagnosed sleep apnea.  There is also a question of diabetic neuropathy.  There is a questionable history of rheumatoid arthritis.  There is a  history of asthma.  She is morbidly obese and has been so for many  years.   REVIEW OF SYSTEMS:  Negative except as noted in the HPI and she does  have pain in her legs when she walks, questionable arthritis.  The rest  of her review of systems were all negative.   PHYSICAL EXAM:  She is a  pleasant, morbidly obese, middle-aged woman in  no acute distress.  The blood pressure was 180/90, the pulse 90 and regular, respirations  were 18, temperature is 98.  HEENT:  Normocephalic and atraumatic.  Pupils are equal and round.  Oropharynx moist.  Sclerae anicteric.  NECK:  Revealed no jugular distention.  There are no thyromegaly.  Trachea is midline.  The carotids are 2+ symmetric.  LUNGS:  Clear bilaterally to auscultation.  No wheezes, rales or rhonchi  were present.  There is no increased work of breathing.  HEART:  The heart sounds were distant.  CARDIAC:  The cardiovascular exam revealed a regular rate and rhythm  with normal S1 and S2. There were no murmurs, rubs or gallops present.  Bowel sounds are present.  There is no rebound or guarding.  She is  morbidly obese.  EXTREMITIES:  Demonstrated 1+ woody edema.  There is no cyanosis.  There  is no clubbing noted.  NEUROLOGIC:  Alert  and oriented x3. Cranial nerves intact.  Strength  was 5/5 and symmetric.   The EKG demonstrates sinus rhythm with a first degree AV  block.   IMPRESSION:  1. Nocturnal bradycardia questionable symptomatic  2. Nonsustained supraventricular tachycardia with symptoms.  3. History of syncope of unclear etiology, questionable arrhythmia,      questionable hypoglycemia, questionable bradycardia.  4. Morbid obesity.   DISCUSSION:  I have discussed the treatment options with the patient.  With her strong family history and multiple cardiac risk factors, I  recommend that she proceed with catheterization to define her coronary  anatomy but she is still refusing this.  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  She is considering all these options and will call  us and let us know if she would like to proceed with catheterization  which I have strongly encourage followed by up titration of AV nodal  blocking drugs plus pacemaker insertion.      Doylene Canning. Ladona Ridgel, MD  Electronically Signed     GWT/MEDQ  D:  03/25/2007  T:  03/26/2007  Job:  [id number]   cc:   Ramon Dredge L. Juanetta Gosling, M.D.

## 2010-10-03 NOTE — Group Therapy Note (Signed)
[name], [name]              ACCOUNT NO.:  [id number]   MEDICAL RECORD NO.:  [id number]          PATIENT TYPE:  INP   LOCATION:  IC09                          FACILITY:  [facility]   PHYSICIAN:  Edward L. Juanetta Gosling, M.D.DATE OF BIRTH:  Jun 23, 1955   DATE OF PROCEDURE:  05/26/2007  DATE OF DISCHARGE:                                 PROGRESS NOTE   HISTORY:  [name] was admitted yesterday with hypertensive urgency.  She is better this morning.  She says she is feeling better.  She has no  new complaints.  She had CT of the chest done because she had an  elevated D-dimer, but it did not show pulmonary emboli.  This morning  she says she is feeling better.  She is on a nitroglycerin drip.   PHYSICAL EXAMINATION:  VITAL SIGNS:  Her pulse is in the 70s, blood  pressure 176/98, O2 sats 96%, respirations in the 20s.   LABORATORY DATA:  Cardiac panel is negative thus far.   ASSESSMENT:  1. She has hypertensive urgency.  She has history of bradycardia that      required a pacemaker.  She has diastolic dysfunction of the heart.  2. She has morbid obesity.  3. She is diabetic.   PLAN:  At this point the plan is going to be to have her continue her  treatments.  She has cardiology consultation pending.  I will see what  we can do about getting her blood pressure controlled off of the  nitroglycerin.      Edward L. Juanetta Gosling, M.D.  Electronically Signed     ELH/MEDQ  D:  05/26/2007  T:  05/26/2007  Job:  [id number]

## 2010-10-03 NOTE — Assessment & Plan Note (Signed)
[facility]                         ELECTROPHYSIOLOGY OFFICE NOTE   NAME:[name], [name]                     MRN:          [id number]  DATE:04/16/2007                            DOB:          1955/07/09    Ms. Percival was seen today in the clinic on April 16, 2007 for a wound  check of her newly implanted Medtronic, model no. [id number] Sensia.  Date  of implant was March 28, 2007 for intermittent complete heart block.  On interrogation of her device today, her battery voltage is 2.79.  P-  waves measure greater than 2.8 mV, with an atrial capture threshold of  0.75 V at 0.5 msec, and an atrial lead impedance of 582 ohms.  R-waves  measured at 16 mV, with a ventricular pacing threshold of 0.5 V at 0.4  msec, and a ventricular lead impedance of 800 ohms.  Underlying rhythm  today was a sinus rhythm at 81 beats a minute.  She is atrially pacing  0.8% of the time, and ventricularly pacing 24.5% of the time.  Captured  adaptive is programmed on in both A and V.  The patient had already  removed Steri-Strips.  On the lateral end of the incision was a small  scabbed area which I removed.  There is not a suture abscess at this  point.  It does not look angry or irritated.  I instructed the patient  to wash it with some warm soap and water a couple times a day and call  if it changes in any way.  She is going to follow up in the [place]  office with Dr. Ladona Ridgel in February 2009.      Altha Harm, LPN  Electronically Signed      Duke Salvia, MD, [facility]  Electronically Signed   PO/MedQ  DD: 04/16/2007  DT: 04/16/2007  Job #: [id number]

## 2010-10-03 NOTE — Group Therapy Note (Signed)
NAMENOU, CHARD              ACCOUNT NO.:  [id number]   MEDICAL RECORD NO.:  [id number]          PATIENT TYPE:  INP   LOCATION:  A201                          FACILITY:  [facility]   PHYSICIAN:  Edward L. Juanetta Gosling, M.D.DATE OF BIRTH:  06/17/1955   DATE OF PROCEDURE:  05/28/2007  DATE OF DISCHARGE:                                 PROGRESS NOTE   Ms. [name] is doing much better.  She feels better.  She has no new  complaints.   Her blood pressure today is 137/72, temperature is 98.9, pulse 78,  respirations 20.  Her chest is clear.  She feels better.  She has  improved as far as her respiratory status is concerned.  Her O2 sat is  96% on 2 liters.  Blood sugar 154 and then 147, so overall much  improved.   My assessment then is that she has had accelerated hypertension or  diabetes mellitus, and she is much improved so she is going to be  discharged home today.  Please see discharge summary for details.  We  are going to see if she needs O2 at home first.      [name] L. Juanetta Gosling, M.D.  Electronically Signed     ELH/MEDQ  D:  05/28/2007  T:  05/28/2007  Job:  [id number]

## 2010-10-03 NOTE — Consult Note (Signed)
[name], [name]              ACCOUNT NO.:  [id number]   MEDICAL RECORD NO.:  [id number]          PATIENT TYPE:  INP   LOCATION:  IC10                          FACILITY:  [facility]   PHYSICIAN:  Tilford Pillar, MD      DATE OF BIRTH:  Jul 14, 1955   DATE OF CONSULTATION:  07/09/2007  DATE OF DISCHARGE:                                 CONSULTATION   CONSULTATION AND PROCEDURE NOTE   REASON FOR CONSULTATION:  Acute respiratory decompensation, requiring  mechanical  ventilation, requiring IV access.   HISTORY OF PRESENT ILLNESS:  The patient is a 55 year old African-  American female with morbid obesity.  He had a history of congestive  heart failure, asthma, hypertension, diabetes, and a previously-placed  pacemaker, as well as a history of coronary artery disease, who  presented with increasing shortness of breath.  He was admitted by Dr.  Juanetta Gosling and was subsequently transferred to the intensive care unit with  increasing hypercapnia with requirement of endotracheal intubation and  mechanical ventilation.  At this point, the patient is endotracheally  intubated and sedated on Diprivan drip.  History was obtained from  reviewing the patient's chart.   PAST MEDICAL HISTORY:  Reviewed.  No bleeding diatheses were noted.   MEDICATIONS:  Were reviewed.  No anticoagulation as noted, although it  was noted that during the intubation attempt, there was a significant  amount of bleeding from endotracheal placement.   PHYSICAL EXAMINATION:  A focused physical exam was conducted.  The  patient is extremely morbidly obese.  She was sedated on a Diprivan  drip.  She did not have any previously.  She has a scar in her left  upper chest, consistent with a previously-placed pacemaker, and her left  and right groin were inspected.  She does appear to have what appears to  be scar lower down on the right anterior side, but this does not appear  to be within the groin; however, in the area of the  femoral vein, she  has a good-sounding 2+ femoral pulse bilaterally.   ASSESSMENT/PLAN:  Respiratory distress requiring mechanical ventilation,  limited IV access, temporary central venous catheter placement has been  planned.  Secondary to patient's morbid obesity and acute presentation,  a femoral line will be placed.  Consent will be obtained from family.   PROCEDURE:  After obtaining consent, the patient's right groin was  prepped.  After the patient was positioned for optimal exposure of the  right groin, a sterile dressing was placed using sterile technique and  Seldinger's technique, a right femoral triple lumen catheter was placed.  The initial femoral stick was conducted using the introducer, an 18-  gauge needle.  During the initial stick, this was attempted now, just  medial to the pulsatile right femoral artery.  During the initial stick,  I suspected the branch of the femoral artery was localized as pulsatile  flow was initially noted.  Minimal pressure was held, which resulted in  adequate hemostasis, no evidence of an expanding hematoma in the groin.  At this time, a second attempt at localizing the femoral  vein, just  medial to the previous stick was successful in identifying the femoral  vein.  Seldinger technique was utilized at the point to place a triple-  lumen catheter to the hub, all three ports easily aspirated and flushed.  This was sutured, secured with 2.0 silk to the groin, and at this point  additional pressure was held to the groin to ensure adequate hemostasis.  There is no evidence after holding pressure for 5 minutes of any  developing hematoma, no bleeding was noted.  A sterile dressing was  placed with a 2x2 dressing and Tegaderm.  In the dressing room, all  sharps were disposed of in proper accordance.  The patient tolerated the  procedure well.  __________ is usable for IV fluid and medications at  this point, as well as lab draws.      Tilford Pillar, MD  Electronically Signed     BZ/MEDQ  D:  07/09/2007  T:  07/10/2007  Job:  [id number]   cc:   Tilford Pillar, MD  Fax: [phone]   Oneal Deputy. Juanetta Gosling, M.D.  Fax: [phone]   Patient Medical Record

## 2010-10-03 NOTE — H&P (Signed)
[name], [name]              ACCOUNT NO.:  [id number]   MEDICAL RECORD NO.:  [id number]          PATIENT TYPE:  INP   LOCATION:  IC09                          FACILITY:  [facility]   PHYSICIAN:  Tesfaye D. Felecia Shelling, MD   DATE OF BIRTH:  Aug 29, 1955   DATE OF ADMISSION:  05/25/2007  DATE OF DISCHARGE:  LH                              HISTORY & PHYSICAL   CHIEF COMPLAINT:  Shortness of breath and generalized weakness.   HISTORY OF PRESENT ILLNESS:  This is 55 year old female patient with a  history of multiple medical illnesses, who is the patient of Ramon Dredge L.  Juanetta Gosling, M.D.  Came to emergency room with the above complaints.  The  patient claims she has not been feeling good since Christmas.  She was  feeling short of breath and weak.  She has been taking her medication  including diuretics.  However, this morning while she was in church the  patient became very short of breath and she was very dizzy.  Then she  was brought to emergency room, where the patient was further evaluated.  During the evaluation in the emergency room the patient was found to  have a blood pressure of 224/115.  The patient was started on a  nitroglycerin drip and her blood pressure gradually decreased to  180/110.  Her baseline labs including cardiac enzymes were negative.  The patient was, however, admitted to control her blood pressure and to  further evaluate.   REVIEW OF SYSTEMS:  The patient has generalized weakness but no fever or  chills, cough, chest pain, nausea, vomiting, abdominal pain, dysuria,  urgency or frequency of urination.   PAST MEDICAL HISTORY:  1. Hypertension.  2. Morbid obesity.  3. History of diabetes mellitus type 2.  4. Bradyarrhythmia.  5. History of bronchial asthma.  6. Diabetes mellitus.  7. Diabetic neuropathy.  8. Osteoarthritis.  9. Status post pacemaker insertion.   CURRENT MEDICATIONS:  1. Simvastatin 80 mg daily.  2. Hydralazine 100 mg four times a day.  3.  Potassium chloride 20 mg b.i.d.  4. Lasix 40 mg b.i.d.  5. Labetalol 300 mg t.i.d.  6. Clonidine 2 mg b.i.d.  7. Glipizide 5 mg p.o. daily.   SOCIAL HISTORY:  The patient is married.  She lives with her family.  No  history of alcohol, tobacco or substance abuse.   PHYSICAL EXAMINATION:  The patient is alert, awake and chronically sick-  looking and obese.  VITALS:  Initial blood pressure on admission to emergency room was  224/115, which has decreased now to 192/91.  Pulse is 126, respiratory  rate 22, temperature 99 degrees Fahrenheit.  HEENT:  Pupils are equal and reactive.  NECK:  Supple.  CHEST:  Decreased air entry, few rhonchi.  CARDIOVASCULAR:  First and second heart sound heard.  No murmur, no  gallop.  ABDOMEN:  Massively obese, soft and lax.  Bowel sound is positive.  No  mass or organomegaly.  EXTREMITIES:  1+ edema.   LABS:  BNP 31.8.  Sodium 143, potassium 3.7, chloride 98, carbon dioxide  33, glucose 134, BUN  9, creatinine 0.4, bilirubin 9, , AST 23, alkaline  phosphatase 80, ALT 17, calcium 9.6.  Cardiac enzymes:  Myoglobin 127,  troponin less than 0.5 and CPK 1.4.  D-dimer 2.44.  However, CT scan of  the chest was negative for pulmonary embolism.  CBC:  WBC 9.9,  hemoglobin 12.5, hematocrit 37.8 and platelets 298.   ASSESSMENT:  1. Hypertensive crisis.  2. Morbid obesity.  3. Diabetes mellitus.  4. History of bradyarrhythmia and is status post pacemaker insertion.  5. Hyperlipidemia.  6. Osteoarthritis.  7. History of bronchial asthma.  8. Questionable sleep apnea syndrome.   PLAN:  Will admit the patient in ICU under telemetry.  Will continue on  nitroglycerin drip.  Will continue her combination of antihypertensive  medications.  Will do a cardiology consult and continue supportive care.      Tesfaye D. Felecia Shelling, MD  Electronically Signed     TDF/MEDQ  D:  05/25/2007  T:  05/26/2007  Job:  [id number]

## 2010-10-03 NOTE — Assessment & Plan Note (Signed)
[facility]                       [place] CARDIOLOGY OFFICE NOTE   NAME:[name], [name] STREAT                     MRN:          [id number]  DATE:12/20/2008                            DOB:          09-Jan-1956    PRIMARY CARDIOLOGIST:  Jesse Sans. Daleen Squibb, MD, [facility]   PRIMARY CARE PHYSICIAN:  Edward L. Juanetta Gosling, MD   ELECTROPHYSIOLOGIST:  Doylene Canning. Ladona Ridgel, MD   REASON FOR OFFICE VISIT:  Followup after 2 visits to the emergency room  for tachycardia.   PROBLEMS LIST:  1. Tachy-brady syndrome.      a.     Status post Medtronic pacemaker SEDRO 1 Sensia placed on       March 28, 2007, for intermittent complete heart block, last       interrogated in November 2009.  2. Hypertension, not well controlled.  3. Morbid obesity.  4. Diabetes.  5. Arthritis, probably osteoarthritis.   OFFICE VISIT:  This is a very pleasant, morbidly obese, 55 year old  [place] female who presents to the office today after being  seen in the emergency room secondary to tachycardia.  The patient had  left her husband who was in the hospital undergoing lung cancer  treatment and returned home and had sudden onset of a rapid heartbeat.  The patient's granddaughter called 911 and the patient was brought to  [facility] on December 09, 2008, secondary to this.  The patient's  heart rate was found to be 182 beats per minute.  The patient did have  some ST depression and left ventricular hypertrophy noted.  The patient  was treated with IV diltiazem and spontaneously converted post diltiazem  to normal sinus rhythm.  The patient's blood pressure stabilized at  139/72.  On arrival, the patient's blood pressure was elevated.  Since  that time, the patient has followed up with her primary care physician,  Dr. Jen Mow, and medications have been adjusted.  Apparently, the patient's  medications are frequently adjusted by our office and by her primary  care physician's office  secondary to not-well-controlled hypertension.  The patient is hypertensive here in the office with a blood pressure of  173/104 on the right arm and 159/96 in the left arm.  The patient denies  any medical noncompliance now or during the episode of tachycardia.  Since the episode on December 09, 2008, the patient has not had any further  reoccurrence of the tachycardia.  The patient denies any chest pain or  shortness of breath.  She is wheelchair bound predominantly and has  complaints of chronic lower extremity edema in the dependent position.   REVIEW OF SYSTEMS:  Review of systems have been reviewed.  All are found  negative except for those mentioned above.  Most recent pacemaker  interrogation was dated April 28, 2008, per Dr. Olga Millers nurse,  Gunnar Fusi, and there were no changes made to her pacemaker.  Her ATR  episodes were less than 0.1%.  Her ventricular high rate, the longest at  6 seconds.  Her mode is a DDD.  Her low high rate is 60/130.  Her AV  delay  is 180/150.  There were no changes made to her pacemaker.   CURRENT MEDICATIONS:  1. Potassium 20 mEq b.i.d.  2. Glipizide 5 mg ER daily.  3. Labetalol 600 mg b.i.d.  4. Tekturna 300 mg daily.  5. Exforge 10/320 daily.  6. Furosemide 100 mg daily.   ALLERGIES:  No known drug allergies are listed.   LABORATORY DATA:  Most recent labs dated December 09, 2008.  Sodium 141,  potassium 4.1, chloride 107, CO2 of 27, glucose 152, BUN 19, creatinine  1.09, and calcium 9.1.  Hemoglobin 12.4, hematocrit 36.5, white blood  cells 12.5, and platelets 241.  Cardiac enzymes, troponin point of care  less than 0.05.   Chest x-ray dated November 23, 2008, revealing low-volume chest with  cardiomegaly.  No definite failure.  Of note, the patient's medication  list in the ER did include spironolactone 50 mg once a day and  carvedilol 25 mg b.i.d. which she is no longer on since seen by her  primary care physician.   PHYSICAL EXAMINATION:  VITAL  SIGNS:  Currently, blood pressure 159/96,  heart rate 61, and respirations 26.  HEENT:  Head is normocephalic and atraumatic.  Eyes, PERRLA.  NECK:  Obese and supple with no JVD or carotid bruits appreciated.  CARDIOVASCULAR:  Distant heart sounds.  Regular rhythm and rate without  murmurs, rubs, or gallops at present.  LUNGS:  Clear to auscultation without wheezes, rales, or rhonchi.  ABDOMEN:  Obese and nontender with no abdominal bruits appreciated.  EXTREMITIES:  With some mild 1+ pitting edema noted in the dependent  position.  SKIN:  Very dry and scaly.  NEUROLOGIC:  Cranial nerves II-XII are grossly intact.  Affect is intact  and pleasant.   IMPRESSION AND PLAN:  1. Paroxysmal tachycardia with most recent visit to the emergency room      on December 09, 2008, treated with Lopressor IV x1 with return of sinus      rhythm with no further complaints of tachycardia since that visit.      The patient has been followed by her primary care physician with      another change in her medications as those listed above.  We will      have her pacemaker interrogated within the next week to evaluate      for amount of tachycardia which may have been ongoing since her      visit in July.  She did have a previous visit in May for similar      tachycardia episode.  The pacemaker histogram will help [place] to see      how often she is experiencing these tachycardic and high      supraventricular rates.  At this time, the patient's medications      will not be changed since she has not had any further episodes of      the tachycardia.  2. Labile blood pressure.  The patient's blood pressure is not well      controlled for a diabetic patient at this time.  I have asked her      to get a blood pressure cuff and to take her readings twice a day,      once in the morning and once in the evening and to bring that with      her on followup appointments so that we can see what her blood      pressure is doing  at home and away from  the office to avoid white      coat syndrome treatment.  The patient verbalizes understanding.      She will bring her blood pressure list back when she has her      pacemaker interrogated in 1 week.  The patient appears stable from      a cardiovascular standpoint at this time with the exception of the      hypertension which will be rechecked prior to her leaving the      office.      Bettey Mare. Lyman Bishop, NP  Electronically Signed      Jonelle Sidle, MD  Electronically Signed   KML/MedQ  DD: 12/20/2008  DT: 12/21/2008  Job #: [id number]

## 2010-10-06 NOTE — Group Therapy Note (Signed)
   NAMEPRABHNOOR, ELLENBERGER                          ACCOUNT NO.:  [id number]   MEDICAL RECORD NO.:  [id number]                   PATIENT TYPE:   LOCATION:                                       FACILITY:   PHYSICIAN:  Edward L. Juanetta Gosling, M.D.             DATE OF BIRTH:   DATE OF PROCEDURE:  06/03/2002  DATE OF DISCHARGE:                                   PROGRESS NOTE   SUBJECTIVE:  This patient was admitted yesterday with chest pain.  She has a  long known history of hypertension and  massive obesity.  She has had  trouble with taking her medications regularly in the past, because of cost,  but she says that she is taking them now. She has gained about 7 pounds over  the last few days, she says.   OBJECTIVE:  Her exam this morning shows that she says she is more  comfortable. She is breathing better.  Her blood pressure 170/80, but I am  not sure that this is with a very large cuff.  Her heart is regular.  Her  abdomen is soft.   ASSESSMENT AND PLAN:  Lab work shows that her electrolytes are normal.  BUN  14, creatinine 0.8, blood sugar is 108. She said her blood sugar was greater  than 200 when checked at home.  She does not have a history of diabetes, but  obviously is at grave risk of developing diabetes; so I am going to go and  set her up for a Hemoglobin A1c level.  Thus far her cardiac enzymes are  normal.  Her blood pressure seems to be fairly well controlled but it is not  clear if her pressure is actually somewhat lower than what we are measuring  because of the cuff.  Her obesity, of course, is unchanged. She is going to  have a Hemoglobin A1c level.  So far enzymes are negative.  We have asked  for cardiology consultation.                                               Edward L. Juanetta Gosling, M.D.    ELH/MEDQ  D:  06/03/2002  T:  06/03/2002  Job:  [id number]

## 2010-10-06 NOTE — H&P (Signed)
NAMEAISSA, LISOWSKI              ACCOUNT NO.:  [id number]   MEDICAL RECORD NO.:  [id number]          PATIENT TYPE:  INP   LOCATION:  A306                          FACILITY:  [facility]   PHYSICIAN:  Hanley Hays. Dechurch, M.D.DATE OF BIRTH:  02/12/1956   DATE OF ADMISSION:  09/04/2005  DATE OF DISCHARGE:  LH                                HISTORY & PHYSICAL   HISTORY OF THE PRESENT ILLNESS:  The patient is a 55 year old African-  American female with past medical history remarkable for diabetes mellitus  and morbid obesity, hypertension and history of congestive heart failure who  actually was seen in the emergency room on September 02, 2005 after a near  syncopal episode associated with hypoglycemia.  Today she presented to the  emergency room complaining of malaise generally feeling poorly and noted to  have a fever of 102.  She is complaining of some diffuse belly and chest  pain, and on further evaluation is noted to have a cellulitis of the lower  abdominal wall.  She has a leukocytosis of 16,000 and her fever is 102 here  in the emergency room.  She is alert and mentation is appropriate.  Diabetic  control is unknown because of her risk factors and the fact that the  cellulitis has been fairly aggressive she is being admitted to the hospital  for IV antibiotics and monitoring.   PAST MEDICAL HISTORY:  1.  Diabetes mellitus.  2.  History of hypertension.  3.  Morbid obesity.  4.  Probable obstructive sleep apnea and obesity hypoventilation.  5.  Congestive heart failure.  6.  Status post hysterectomy.   MEDICATIONS:  The patient's medications include:  1.  Lopressor 100 twice a day.  2.  Verapamil 240 twice a day.  3.  Clonidine 0.3 twice a day.  4.  Glipizide 5 mg daily.  5.  Lasix 40 twice a day.  6.  Concerta 18 mg daily.  7.  The patient uses an occasional Aleve for pain at home.  8.  The patient was prescribed Lyrica, Allegra and prednisone, which she      does not take  regularly due to expense and inefficacy.  9.  The patient does take Zaroxolyn on a daily regular basis, 10 mg.   FAMILY HISTORY:  The family medical history is pertinent for diabetes and  hypertension.   SOCIAL HISTORY:  The patient is married and has five children.  No alcohol  or tobacco abuse.  She lives with her husband.   REVIEW OF SYSTEMS:  The patient is very limited by her rheumatoid  arthritis and obesity.  She is primarily bed-to-chair.  She has to sleep in  a chair secondary to shortness of breath.  She has had no change in her  exercise tolerance.  She complains of intermittent chest pain that has not  changed in nature and has been doing so for years; she was evaluated in 2004  for same.  She notes chronic constipation.  Denies any GU complaints.  The  review of systems is otherwise unremarkable.   PHYSICAL EXAMINATION:  GENERAL APPEARANCE:  The patient is alert.  Mental  status is intact.  Gait is not evaluated, but she moves all her extremities  times four without difficulty.  VITAL SIGNS:  Temperature is 102.5 orally.  Blood pressure is now 166/86; it  was 228/134.  Pulse is 77 and regular.  Respirations are unlabored.  O2  saturation is 98% in 2 liters.  HEENT:  Oropharynx moist.  LUNGS:  The lungs are clear, though diminished.  HEART:  The heart is regular, but distant.  ABDOMEN:  The patient's abdomen is soft and obese.  She has some maceration  in the intertriginous folds of her abdomen.  In the midline there is  evidence of what looks like a chronic yeast-like rash with cellulitis in a  butterfly pattern from the midline extending to about the mid abdomen  bilaterally.  It is tender, but no crepitance is noted.  EXTREMITIES:  The extremities are without clubbing or cyanosis.  She has  chronic stasis, but no significant edema.   ASSESSMENT AND PLAN:  1.  Cellulitis of the abdominal wall.  2.  Diabetes mellitus, control unknown.  3.  Morbid obesity.  4.   Hypertension with suboptimal control.   Plan:  1.  The patient will be admitted with,  2.  IV antibiotics; she received Ancef here in the emergency room, which      will be continued with the addition of Bactrim.  3.  If she does not respond significantly in the next 24 hours to the above      we will consider vancomycin; blood cultures have been obtained.  4.  We will continue her usual medications.  She may  need some improvement      in her antihypertensive regimen.  There has apparently been an issue      regarding her compliance.  She is not on an ACE inhibitor, but this will      be deferred to her primary care physician.   The plans were discussed with the patient and her daughters and  granddaughters who were present.  They seem to have a reasonable  understanding.      Hanley Hays Josefine Class, M.D.  Electronically Signed     FED/MEDQ  D:  09/05/2005  T:  09/05/2005  Job:  [id number]

## 2010-10-06 NOTE — Group Therapy Note (Signed)
[facility]  Patient:    [name], [name] Visit Number: [id number] MRN: [id number]          Service Type: MED Location: 2A A218 01 Attending Physician:  Hilario Quarry Dictated by:   Kari Baars, M.D. Admit Date:  09/08/2001                               Progress Note  PROBLEMS:  Febrile illness, hypertension, congestive heart failure, massive obesity.  SUBJECTIVE:  [name] says she is feeling better.  She is afebrile this morning.  Her blood pressure has remained elevated, however.  She has no new complaints.  PHYSICAL EXAMINATION  CHEST:  Clearer than before.  VITAL SIGNS:  Blood pressure about 170/80 right now, pulse 80.  HEART:  Regular.  ASSESSMENT:  She does seem a little better.  Her blood cultures are negative at this point.  Her potassium was 3 yesterday.  This is being replaced.  Will repeat laboratory work tomorrow.  Try to get her up and moving around today. Try to adjust her medications for her blood pressure to see if we can get control of her blood pressure.  I am going to put her on Catapres now 0.2 mg b.i.d.  Continue with her antibiotics.  Replace her potassium as mentioned. Get her up and moving around to try to avoid any venous stasis.  She does need to be on Lovenox because of her weight. Dictated by:   Kari Baars, M.D. Attending Physician:  Hilario Quarry DD:  09/09/01 TD:  09/09/01 Job: [id number]

## 2010-10-06 NOTE — Group Therapy Note (Signed)
[facility]  Patient:    [name], RAHMING Visit Number: [id number] MRN: [id number]          Service Type: MED Location: 2A A218 01 Attending Physician:  Fredirick Maudlin Dictated by:   Kari Baars, M.D. Admit Date:  09/08/2001 Discharge Date: 09/12/2001                               Progress Note  PROBLEMS:  Hypertension, hypertensive crisis, congestive heart failure, massive obesity, hypokalemia.  SUBJECTIVE:  Ms. Kutsch says she is a little better today.  Her blood pressure this evening has come down to about 130/60 or so.  She is now on Catapres 0.2 mg t.i.d., verapamil 240 mg b.i.d., and on 100 mg of metoprolol.  Her weight is down by about 6 pounds.  Her chest is much clearer.  She looks much more comfortable.  Her heart is regular.  Her abdomen is soft.  ASSESSMENT:  She is better.  PLAN:  Continue with her treatments and medicines.  No changes today.  She may be able to be discharged tomorrow if she continues to do this well.  I have discussed this at length with her and with her family and all are in agreement that if she is doing this well she will probably be discharged tomorrow. Dictated by:   Kari Baars, M.D. Attending Physician:  Fredirick Maudlin DD:  09/11/01 TD:  09/12/01 Job: [id number]

## 2010-10-06 NOTE — Group Therapy Note (Signed)
   NAMEWILFRED, SIVERSON                          ACCOUNT NO.:  [id number]   MEDICAL RECORD NO.:  [id number]                   PATIENT TYPE:  INP   LOCATION:  A224                                 FACILITY:  [facility]   PHYSICIAN:  Edward L. Juanetta Gosling, M.D.             DATE OF BIRTH:  10-28-1955   DATE OF PROCEDURE:  06/05/2002  DATE OF DISCHARGE:                                   PROGRESS NOTE   PROBLEMS:  1. Hypertension.  2. Congestive heart failure.  3. Massive obesity.   SUBJECTIVE:  The patient says she is feeling okay.  She would like to go  home.   PHYSICAL EXAMINATION:  VITAL SIGNS:  Temperature 98.1, pulse 60,  respirations 20, blood pressure 184/98 but this is just after she has walked  back from the bathroom, later 140/78.  O2 saturation 98% on room air  yesterday.  Weight 352.6 pounds.  GENERAL:  She said that she slept well.  She did not have any problems.  CHEST:  Much clearer than before.  HEART:  Regular.   ASSESSMENT:  She is much improved.   PLAN:  The plan is to continue with her treatments and medications but  discharge her home today.  Please see discharge summary for details.                                               Edward L. Juanetta Gosling, M.D.    ELH/MEDQ  D:  06/05/2002  T:  06/05/2002  Job:  [id number]

## 2010-10-06 NOTE — H&P (Signed)
[name], [name]              ACCOUNT NO.:  [id number]   MEDICAL RECORD NO.:  [id number]          PATIENT TYPE:  OBV   LOCATION:  A220                          FACILITY:  [facility]   PHYSICIAN:  Edward L. Juanetta Gosling, M.D.DATE OF BIRTH:  February 29, 1956   DATE OF ADMISSION:  01/23/2006  DATE OF DISCHARGE:  LH                                HISTORY & PHYSICAL   REASON FOR OBSERVATION:  Asthmatic bronchitis.   HISTORY:  [name] is a 55 year old with a history of diabetes, morbid  obesity, hypertension, history of CHF and asthma who presented to the  emergency room with shortness breath and wheezing.  She also says she has a  history of rheumatoid arthritis, but I am not aware of that diagnosis.  When  she was seen in the emergency room, she was wheezing.  She had taken a  nebulizer treatment that helped a little bit, but she was still having some  shortness of breath and is being brought in for observation for overnight to  see how she is doing and make sure she is okay.   Her past medical history is positive for diabetes, hypertension, morbid  obesity, sleep apnea CHF.  She has had a hysterectomy.   She had been on:  1. Lopressor 100 mg b.i.d.  2. Verapamil mental 240 mg b.i.d.  3. Clonidine 0.3 3 times a day.  4. Glipizide 5 mg daily.  5. Lasix 40 mg b.i.d.   There are a number of other medications, but I am not sure she is taking any  of them.  She had been on Zaroxolyn as well.   FAMILY HISTORY:  Is very positive for diabetes, hypertension and obesity.   SOCIAL HISTORY:  She is married with five children.  She does not use  tobacco.  She does not use any alcohol.  She lives at home.   REVIEW OF SYSTEMS:  Reveals that she has had multiple bouts of problems with  her blood pressure. She is generally very limited by her obesity.  She  usually sleeps in a chair.  She has been coughing some.  She says she  actually felt well until the last 2 days.  She has not had any fever.   PHYSICAL EXAMINATION:  GENERAL:  She is awake and alert.  Her temperature  100.0, blood pressure 199/108, pulse 84, respirations 24, O2 saturation 96%.  HEENT:  Her pupils are reactive. Her mucous membranes are moist.  NECK: Her neck is supple without masses.  CHEST: Her chest has some rhonchi and minimal wheezing.  HEART:  Her heart is regular.  Heart sounds are distant.  ABDOMEN:  Her abdomen is soft and obese.  Bowel sounds are active.  She does  not have any palpable organs.  EXTREMITIES:  Showed chronic venous stasis and what looks like probably some  lymphedema.   She unfortunately has not been following up in my office, so I do not know  what sort of control she has had of her diabetes or hypertension as an  outpatient.  She has had lab work  here which  included a BNP that was 119,  although it is up a little bit, it is not terribly high, so I do not think  what she is having now is acute congestive heart failure.  White count 9800,  hemoglobin 13.4, platelets 304.  Her BMET shows potassium 3.1, BUN 6,  creatinine 0.9.   Chest x-ray shows cardiac enlargement.  No acute abnormalities.   ASSESSMENT:  1. She has probably an asthmatic bronchitis.  2. She has diabetes with unknown controlled.  3. She has hypertension, not totally controlled here in the emergency      room.  4. She had a diagnosis she says of rheumatoid arthritis.  I am not aware      of that diagnosis, but she is taking prednisone at home.  I will go      ahead and investigate that further.  5. She has morbid obesity.  6. She has probable sleep apnea.   PLAN:  Plan then is to go ahead and put her on antibiotics. I am going to  put her on Solu-Medrol to try to minimize the dose, but she needs some and  then will have her take nebulizer treatments.  Continue with metoprolol,  verapamil, and clonidine. Put her on sliding scale of NovoLog, Lovenox for  DVT prophylaxis, and follow.      Edward L. Juanetta Gosling, M.D.   Electronically Signed     ELH/MEDQ  D:  01/23/2006  T:  01/23/2006  Job:  [id number]

## 2010-10-06 NOTE — H&P (Signed)
[facility]  Patient:    [name], [name] Visit Number: [id number] MRN: [id number]          Service Type: MED Location: 2A A218 01 Attending Physician:  Hilario Quarry Dictated by:   Kari Baars, M.D. Admit Date:  09/08/2001                           History and Physical  REASON FOR ADMISSION:  Febrile illness.  HISTORY OF PRESENT ILLNESS:  The patient is a 55 year old who has a past medical history of severe hypertension, asthma, congestive heart failure, massive obesity.  She came to the emergency room at [facility] with fever, aching all over, including a headache.  Was found to have marked hypertension and temperature, as mentioned, up to 102.  Because of these findings it was felt that she needed to be admitted to the hospital.  She has had several previous hospitalizations which have been related to her severe hypertension, her congestive heart failure.  She says that she has been taking her medications only intermittently because of financial problems.  MEDICATIONS:  She is supposed to be on: 1. Verapamil 240 mg b.i.d.  She is taking it once a day. 2. Altace 5 mg b.i.d. 3. Lasix 40 mg up to three times a day depending on fluid, which she is not    taking at all.  FAMILY HISTORY:  Positive for congestive heart failure and stroke.  SOCIAL HISTORY:  She does not smoke cigarettes.  She does not drink any alcohol.  She has been unable to work for about three years because of her multiple medical problems.  REVIEW OF SYSTEMS:  Otherwise is essentially negative.  PHYSICAL EXAMINATION:  GENERAL:  She appears to be acutely ill.  VITAL SIGNS:  Blood pressure initially was about 220/120, has come down since she has been treated in the emergency room.  Temperature to 102.  CHEST:  Showed some rhonchi bilaterally.  ABDOMEN:  Soft.  EXTREMITIES:  No edema.  HEENT:  Mucous membranes are dry.  NECK:  Supple.  Without masses.  LABORATORY  DATA:  Her urine does show some white blood cells, catheterized specimen.  Chest x-ray does not show any evidence of pneumonia.  PLAN:  Go ahead with current medications and treatments, and have her treated for influenza as well.  Also, will have her started on antibiotics, restart her antihypertensives, and follow from there. Dictated by:   Kari Baars, M.D. Attending Physician:  Hilario Quarry DD:  09/08/01 TD:  09/08/01 Job: [id number]

## 2010-10-06 NOTE — Discharge Summary (Signed)
   NAMEKENITA, [name]                          ACCOUNT NO.:  [id number]   MEDICAL RECORD NO.:  [id number]                   PATIENT TYPE:  INP   LOCATION:  A224                                 FACILITY:  [facility]   PHYSICIAN:  Edward L. Juanetta Gosling, M.D.             DATE OF BIRTH:  04/24/1956   DATE OF ADMISSION:  06/02/2002  DATE OF DISCHARGE:                                 DISCHARGE SUMMARY   FINAL DIAGNOSES:  1. Chest pain, myocardial infarction ruled out.  2. Hypertension.  3. Congestive heart failure.  4. Status post hysterectomy.  5. Massive obesity.   HISTORY OF PRESENT ILLNESS:  This is a 55 year old who has a long-known  history of hypertension, congestive heart failure.  She has been having  chest pain and increased shortness of breath for the last two days.  She  says that it is constant and retrosternal and got better with aspirin in the  emergency room.   PHYSICAL EXAMINATION:  Her exam shows a massively obese female who is in no  acute distress.  Her pupils are reactive.  Neck is supple, without definite  jugular venous distention.  Her chest shows decreased air flow, basilar  rales bilaterally.  No wheezes.  She was tender in her anterior chest wall.  Heart sounds were normal.  CNS showed that she was alert and oriented.   LABORATORY DATA:  EKG was sinus at 66.  T-wave inversion in aVL and V4.   Sodium 135, potassium 3.4, chloride 102, CO2 26, BUN 13, creatinine 0.9,  glucose 125.  White count 10,000, hemoglobin 12.9.   Chest x-ray showed cardiomegaly, pulmonary vascular congestion, chronic  bronchitic changes.   HOSPITAL COURSE:  She was treated with intravenous diuretics.  Her  medications were adjusted.  She received O2, and she had a cardiology  consultation.  She underwent an adenosine Cardiolite graded exercise test  which was normal.  She is discharged home.   DISCHARGE MEDICATIONS:  1. Verapamil 240 mg daily.  2. Metoprolol 25 mg b.i.d.  3. Demadex,  which is a change from her previous Lasix, 50 mg b.i.d.  4.     Clonidine 0.3 mg three times daily instead of 0.2 which she was on when she      came to the hospital.  5. KCl 20 mEq b.i.d.   DISCHARGE INSTRUCTIONS:  She is going to have a hospital bed as well.                                                Edward L. Juanetta Gosling, M.D.    ELH/MEDQ  D:  06/05/2002  T:  06/05/2002  Job:  [id number]

## 2010-10-06 NOTE — Group Therapy Note (Signed)
NAMEGIANAH, [name]              ACCOUNT NO.:  [id number]   MEDICAL RECORD NO.:  [id number]          PATIENT TYPE:  INP   LOCATION:  A306                          FACILITY:  [facility]   PHYSICIAN:  Edward L. Juanetta Gosling, M.D.DATE OF BIRTH:  [birth year]   DATE OF PROCEDURE:  DATE OF DISCHARGE:                                   PROGRESS NOTE   Problem is cellulitis of the abdominal wall, diabetes, morbid obesity,  hypertension, CHF.   [name] was admitted yesterday with cellulitis of the abdominal wall.  She says that she had fever as high as 102.  Her white blood count was  16,000.  And she has just not felt well for the last several days.  She says  she is a little bit better since she has been admitted.  She has been on  intravenous antibiotics.   PHYSICAL EXAMINATION:  VITAL SIGNS:  Her physical examination shows  temperature 98.7, pulse 63, respirations 20, blood sugar was 138, blood  pressure 163/104, O2 sats 94% on 2 liters.  Weight 381.2.  ABDOMEN:  Shows an excoriated inflamed area that is in the mid abdominal  region.  Within the center of this, an excoriated area that is probably from  her skin rubbing together in the intertriginous area.  She also has fairly  marked chronic yeast-looking rash in what is described by Dr. Josefine Class,  which I think is a good description, of butterfly pattern from the midline  to the mid abdomen to the groin in both upper thighs.   ASSESSMENT:  1.  I have told her that she has clear evidence of cellulitis.  She needs to      be treated with intravenous antibiotics for that.  2.  She has diabetes mellitus which is today well-controlled.  3.  Morbid obesity which is going to make all of her treatments more      difficult.  4.  Hypertension.  Today her pressure is still up.  5.  History of congestive heart failure.   PLAN:  1.  She is going to need to stay in the hospital at least another 24 hours      while we make sure that she remains  afebrile.  2.  We need to try to formulate some sort of a plan for home.      Edward L. Juanetta Gosling, M.D.  Electronically Signed     ELH/MEDQ  D:  09/05/2005  T:  09/05/2005  Job:  [id number]

## 2010-10-06 NOTE — Op Note (Signed)
   NAMEMARCINE, [name]                          ACCOUNT NO.:  [id number]   MEDICAL RECORD NO.:  [id number]                   PATIENT TYPE:  INP   LOCATION:  A224                                 FACILITY:  [facility]   PHYSICIAN:  Vida Roller, M.D.                DATE OF BIRTH:  1955/10/10   DATE OF PROCEDURE:  06/03/2002  DATE OF DISCHARGE:                                 OPERATIVE REPORT   PROCEDURE:  Transthoracic Echo.   TAPE NUMBER:  [id number]   INDICATIONS FOR PROCEDURE:  Chest pain and shortness of breath.   CLINICAL INFORMATION:  Hypertension and dyspnea on exertion.   RESULTS:  Aortic diameter was measured at 4 cm which is within normal  limits. The left atrium is measured at 3 cm which is within normal limits.  The septum was measured at 17 mm which is mildly hypertrophied. The  posterior wall was measured at 14 mm which is also mildly hypertrophied.  Left ventricular diastolic dimension is 44 mm, left ventricular systolic  dimension is 34 mm. Estimated ejection fraction is 55-60%.   This is a limited study due to the patient's body habitus and required  infusion of 6 mg of Optison contrast to image the left ventricle adequately  to assess the left ventricular function. There appears to be preserved  systolic function with no obvious wall motion abnormalities. We could not  assess either the valvular morphology or function. This is a contrast study  as stated previously.   RECOMMENDATIONS:  That the patient be considered for either a nuclear  perfusion imaging or coronary angiography to assess the etiology of her  chest discomfort and shortness of breath.                                               Vida Roller, M.D.    JH/MEDQ  D:  06/03/2002  T:  06/03/2002  Job:  [id number]

## 2010-10-06 NOTE — Group Therapy Note (Signed)
NAMEHISAKO, BUGH              ACCOUNT NO.:  [id number]   MEDICAL RECORD NO.:  [id number]          PATIENT TYPE:  INP   LOCATION:  A306                          FACILITY:  [facility]   PHYSICIAN:  Edward L. Juanetta Gosling, M.D.DATE OF BIRTH:  Dec 30, 1955   DATE OF PROCEDURE:  DATE OF DISCHARGE:                                   PROGRESS NOTE   Ms. Bandel says she has a sore throat this morning, but otherwise, she is  doing about the same.  She does not have any other new complaints.  Her leg  is still somewhat painful.  Her physical exam shows that her chest is clear  with decreased breath sounds.  Her heart is regular.  Her abdomen is soft.  Her leg is still mildly tender.  Her skin shows that her cellulitis looks  perhaps a little better.  She continues to have significant problems with  cellulitis, but I think it is a little bit better.  Her temperature is 98.6,  pulse 60, respirations 24, blood pressure still up at 179/110.  Earlier, she  was not having the large blood pressure cuff used to measure her blood  pressure and that of course could create significant trouble with the  measurements.  Considering all that, I am going to have her continue on her  meds, continue on her treatment, and I will follow her up after her  ultrasound of the leg and after she continues on the antibiotics, etc..  I  am going to give her some Chloraseptic for her throat.  Her throat actually  does not show very much.  She is also complaining that her right ear is  painful and it does not appear to show a great deal of change.  My plan then  is as above.      Edward L. Juanetta Gosling, M.D.  Electronically Signed     ELH/MEDQ  D:  09/06/2005  T:  09/06/2005  Job:  [id number]

## 2010-10-06 NOTE — Group Therapy Note (Signed)
[facility]  Patient:    [name], [name] Visit Number: [id number] MRN: [id number]          Service Type: MED Location: 2A A218 01 Attending Physician:  Fredirick Maudlin Dictated by:   Kari Baars, M.D. Proc. Date: 09/12/01 Admit Date:  09/08/2001 Discharge Date: 09/12/2001                               Progress Note  PROBLEMS 1. Hypertension. 2. Congestive heart failure. 3. Massive obesity.  SUBJECTIVE:  Ms. Pothier says she is feeling better.  She has no new complaints.  She slept well and has no shortness of breath, cough, or sputum production.  OBJECTIVE:  Her physical examination today shows her blood pressure is about 150/90, pulse 60.  Chest is clear.  She has no edema of the legs.  Her weight is up one pound, but down a total of five pounds from admission.  ASSESSMENT:  She is much improved.  PLAN:  Discharge home.  Please see the discharge summary for details. Dictated by:   Kari Baars, M.D. Attending Physician:  Fredirick Maudlin DD:  09/12/01 TD:  09/13/01 Job: [id number]

## 2010-10-06 NOTE — Group Therapy Note (Signed)
[name], [name]              ACCOUNT NO.:  [id number]   MEDICAL RECORD NO.:  [id number]          PATIENT TYPE:  INP   LOCATION:  A306                          FACILITY:  [facility]   PHYSICIAN:  Edward L. Juanetta Gosling, M.D.DATE OF BIRTH:  [birth year]   DATE OF PROCEDURE:  09/07/2005  DATE OF DISCHARGE:                                   PROGRESS NOTE   PROBLEM:  Cellulitis of the abdominal wall, congestive heart failure on the  basis of diastolic dysfunction, severe hypertension, morbid obesity.   SUBJECTIVE:  [name] says she is feeling okay. She has no new complaints.   OBJECTIVE:  Her physical examination today shows that she is sitting up, she  looks comfortable. Her temperature 101, pulse 64, respirations 22, blood  sugar 139, blood pressure 156/95, O2 saturation is 94%. Her chest is clear.  Her heart is regular. Her abdomen looks much better. She still has an  excoriated area in the midabdominal area. Her heart is regular.   ASSESSMENT AND PLAN:  Everything seems to be doing a little bit better. The  help of the wound care nurse is appreciated. We will plan to continue with  antibiotics, local treatment, etc.      Edward L. Juanetta Gosling, M.D.  Electronically Signed     ELH/MEDQ  D:  09/07/2005  T:  09/07/2005  Job:  [id number]

## 2010-10-06 NOTE — Discharge Summary (Signed)
NAMELUCERO, [name]              ACCOUNT NO.:  [id number]   MEDICAL RECORD NO.:  [id number]          PATIENT TYPE:  INP   LOCATION:  A220                          FACILITY:  [facility]   PHYSICIAN:  Edward L. Juanetta Gosling, M.D.DATE OF BIRTH:  04-Jun-1955   DATE OF ADMISSION:  01/23/2006  DATE OF DISCHARGE:  LH                                 DISCHARGE SUMMARY   FINAL DISCHARGE DIAGNOSES:  1. Asthmatic bronchitis.  2. Hypertension.  3. Diabetes.  4. Morbid obesity.  5. Probable sleep apnea.  6. Diabetic neuropathy.  7. History of congestive heart failure.  8. Questionable history of rheumatoid arthritis..  9. Sinus bradycardia.   HISTORY OF PRESENT ILLNESS:  Ms. [name] is a 54 year old with a history of  diabetes, morbid obesity, hypertension and asthma, who came to the emergency  room shortness of breath and wheezing.  She was wheezing and took a  nebulizer treatment in the emergency room and did not seem to make a lot of  difference.  She continued to have shortness of breath, despite her  treatment.   PHYSICAL EXAMINATION:  GENERAL:  A well-developed, well-nourished female who  is morbidly obese.  VITAL SIGNS:  Her blood pressure was 198/108, pulse was 84, respirations 24,  O2 96%.  NECK was supple.  CHEST:  showed rhonchi and minimal wheezes.  HEART:  Regular.  ABDOMEN:  Bowel sounds were active.  EXTREMITIES:  Chronic venous stasis and some lymphedema.   HOSPITAL COURSE:  She was treated with IV steroids and given antibiotics,  but her heart rate dropped during the night as she was asleep, but it did  drop down into the 20s; she was asymptomatic.  She is on Lopressor and  verapamil and clonidine at home.  I stopped the verapamil and switched her  to amlodipine.  By the time of discharge, she was much improved and she was  discharged home on:   DISCHARGE MEDICATIONS:  1. Lopressor 100 mg b.i.d.  2. Clonidine 0.3 mg three times a day.  3. Glipizide 5 mg daily.  4. Lasix 40 mg  b.i.d.  5. Lyrica 50 mg t.i.d., which is a new medication for diabetic neuropathy      that seemed to help a great deal.  6. Ceftin 500 mg b.i.d.  7. Prednisone 40 mg x2 days, 30 x2 days, 20 x2 days, 10 x2 days.  8. Ventolin HFA nebulizer.      Edward L. Juanetta Gosling, M.D.  Electronically Signed     ELH/MEDQ  D:  01/25/2006  T:  01/25/2006  Job:  [id number]

## 2010-10-06 NOTE — Group Therapy Note (Signed)
[name], [name]              ACCOUNT NO.:  [id number]   MEDICAL RECORD NO.:  [id number]          PATIENT TYPE:  INP   LOCATION:  A220                          FACILITY:  [facility]   PHYSICIAN:  Edward L. Juanetta Gosling, M.D.DATE OF BIRTH:  [birth year]   DATE OF PROCEDURE:  DATE OF DISCHARGE:                                   PROGRESS NOTE   Problems include diabetes, hypertension, asthmatic bronchitis morbid  obesity, probable sleep apnea.   SUBJECTIVE:  Ms. Chavers is overall about the same.  She is complaining that  her legs are painful and wants something for that.  This seems to be a  diabetic neuropathy.   GENERAL:  Her physical examination today shows that she is awake and alert.  CHEST:  Her chest is pretty clear.  VITAL SIGNS:  Temp is 98.3, pulse 69, respirations 16, blood sugar 205,  blood pressure, however, 198/124, and her heart rate dropped into the 20s  last night.   Unfortunately, all 3 of her blood pressure medications can cause her heart  rate to drop, and she has significant problems with probable sleep apnea.  So I think her heart rate dropping is a combination.  I have stopped her  Verapamil and put her on Norvasc 10 mg b.i.d.  She is going to need a sleep  study, and I will plan to continue with all the other treatments and follow.  She is going to be fully admitted.  I am going to put her on Lyrica for her  leg pain and as mentioned change her to Norvasc.      Edward L. Juanetta Gosling, M.D.  Electronically Signed     ELH/MEDQ  D:  01/24/2006  T:  01/24/2006  Job:  [id number]

## 2010-10-06 NOTE — Group Therapy Note (Signed)
[name], [name]              ACCOUNT NO.:  [id number]   MEDICAL RECORD NO.:  [id number]          PATIENT TYPE:  INP   LOCATION:  A220                          FACILITY:  [facility]   PHYSICIAN:  Edward L. Juanetta Gosling, M.D.DATE OF BIRTH:  1955-08-23   DATE OF PROCEDURE:  01/25/2006  DATE OF DISCHARGE:                                   PROGRESS NOTE   PROGRESS NOTE - January 25, 2006   SUBJECTIVE:  [name] is much improved today. She says she feels well and  she would like to go home.  The Lyrica worked very well for her diabetic  neuropathy and she is very pleased with that.  She is not as short of  breath. She is not coughing.  She is not wheezing.  She says otherwise  everything is much better.   OBJECTIVE:  Her physical examination today shows her blood pressure is  better.  Her blood sugar is better in the 140's.  Her chest is clear.   ASSESSMENT:  She is much improved.   PLAN:  For discharge home today.  Please see discharge summary for details.      Edward L. Juanetta Gosling, M.D.  Electronically Signed     ELH/MEDQ  D:  01/25/2006  T:  01/25/2006  Job:  [id number]

## 2010-10-06 NOTE — Discharge Summary (Signed)
[name], [name]              ACCOUNT NO.:  [id number]   MEDICAL RECORD NO.:  [id number]          PATIENT TYPE:  INP   LOCATION:  A306                          FACILITY:  [facility]   PHYSICIAN:  Edward L. Juanetta Gosling, M.D.DATE OF BIRTH:  17-Mar-1956   DATE OF ADMISSION:  09/04/2005  DATE OF DISCHARGE:  04/22/2007LH                                 DISCHARGE SUMMARY   FINAL DISCHARGE DIAGNOSES:  1.  Cellulitis of the abdominal wall.  2.  Diabetes mellitus.  3.  Morbid obesity.  4.  Hypertension.  5.  Congestive heart failure.  6.  Abdominal yeast infection.  7.  Obstructive sleep apnea.   [name] is a 55 year old African-American female with a long known  history of multiple medical problems, mostly hypertension and CHF,  and who  has recently developed diabetes mellitus. She came to the emergency room  complaining malaise and had a temperature of 102. She was complaining of  abdominal discomfort and was noted to have cellulitis of lower abdominal  wall, had a leukocytosis of 16,000, a temperature of 102.   Her exam on admission showed that she was alert, obese, moved all  extremities.  Temperature 102.5, blood pressure 166/86, pulse 77, O2  saturation 98%. Her abdominal abdomen was soft and obese with maceration of  the intertriginous folds of her abdomen with a yeast-like a rash in a  butterfly pattern.   HOSPITAL COURSE:  She was started on intravenous antibiotics, continued on  her antihypertensives, and improved over the next several days. She had a  wound care consultation. She was on cefazolin for her infection. She is  taking Diflucan for her yeast infection, and she was continued on her other  medications in the meantime. By the time of discharge her fever had  dissipated, she felt better, overall was doing better, and she is discharged  home on Ceftin 500 mg b.i.d.  She going to continue with her other  medications, which are:   1.  Lopressor 100 mg twice a day.  2.   Verapamil 20 mg twice a day/  3.  Clonidine 0.3 mg twice a day.  4.  Glipizide 5 mg daily.  5.  Lasix 40 mg b.i.d.  6.  Concerta 18 mg daily.  7.  Zaroxolyn 10 mg daily.  8.  She is going to take Diflucan 100 mg daily x10 more days.      Edward L. Juanetta Gosling, M.D.  Electronically Signed     ELH/MEDQ  D:  09/09/2005  T:  09/11/2005  Job:  [id number]

## 2010-10-06 NOTE — Group Therapy Note (Signed)
   [name], STARK                          ACCOUNT NO.:  [id number]   MEDICAL RECORD NO.:  [id number]                   PATIENT TYPE:  INP   LOCATION:  A224                                 FACILITY:  [facility]   PHYSICIAN:  Edward L. Juanetta Gosling, M.D.             DATE OF BIRTH:  08-Mar-1956   DATE OF PROCEDURE:  DATE OF DISCHARGE:                                   PROGRESS NOTE   PROBLEMS:  1. Congestive heart failure.  2. Hypertension.  3. Possible coronary disease.  4. Massive obesity.   SUBJECTIVE:  The patient says she is feeling better.  She has no new  complaints.  She does have some irrigation of her nose from her oxygen.  She  slept better last night.   PHYSICAL EXAMINATION:  VITAL SIGNS:  Temperature 97.4, pulse 50,  respirations 20, blood pressure 143/100, weight listed as 364.4.  Her I&O is  -800.   ASSESSMENT:  She is better.  She is not having any more chest pain.  She is  set for a Cardiolite stress test today to see if she has evidence of  ischemia.  I discussed the situation with Vida Roller, M.D. last night  and he suggested if she does have ischemia than she is going to need to have  possible cardiac catheterization.                                               Edward L. Juanetta Gosling, M.D.    ELH/MEDQ  D:  06/04/2002  T:  06/04/2002  Job:  [id number]

## 2010-10-06 NOTE — Discharge Summary (Signed)
[facility]  Patient:    [name], SEYBOLD Visit Number: [id number] MRN: [id number]          Service Type: MED Location: 2A A218 01 Attending Physician:  Fredirick Maudlin Dictated by:   Kari Baars, M.D. Admit Date:  09/08/2001 Discharge Date: 09/12/2001                             Discharge Summary  FINAL DISCHARGE DIAGNOSES: 1. Hypertensive urgency. 2. Congestive heart failure. 3. Massive obesity. 4. Leg pain. 5. Febrile illness of unknown cause.  HISTORY:  The patient is a 55 year old who has had a long known history of multiple medical problems including severe obesity. She has been in her usual state of fairly poor health at home, had stopped taking some of her medications because of cost considerations, and eventually developed severe headache. She was treated in the emergency room but not clear enough to allow her to be discharged home. When she was seen in the emergency room, she had a temperature of about 102, her chest was with rales bilaterally, her heart was regular, her blood pressure initially about 220/120, urine essentially negative, blood cultures were negative. Chest x-ray showed cardiomegaly, increased vascularity, but no acute abnormalities.  HOSPITAL COURSE:  She was treated with intravenous Lasix, given medications for her blood pressure and improved. By the time of discharge, her blood pressure was under much better control, she had lost about 5 pounds of fluid.  DISCHARGE MEDICATIONS: 1. Catapres 0.2 mg q.8 h. 2. Calan SR 240 mg one b.i.d. 3. K-Dur 20 mEq q.i.d. 4. Lasix 40 mg b.i.d. 5. Toprol-XL 100 mg daily.  FOLLOWUP:  She is going to follow up in my office in about 2-3 weeks. Dictated by:   Kari Baars, M.D. Attending Physician:  Fredirick Maudlin DD:  09/12/01 TD:  09/12/01 Job: [id number]

## 2010-10-06 NOTE — Consult Note (Signed)
NAME:  [name], [name]                          ACCOUNT NO.:  [id number]   MEDICAL RECORD NO.:  [id number]                   PATIENT TYPE:  INP   LOCATION:  A224                                 FACILITY:  [facility]   PHYSICIAN:  E. Graceann Congress, M.D. [facility]         DATE OF BIRTH:  [birth year]   DATE OF CONSULTATION:  DATE OF DISCHARGE:                                   CONSULTATION   REASON FOR CONSULTATION:  Thank you for the opportunity of evaluating this  nice 55 year old black female, grandmother of 71, with history of  hypertension, diastolic dysfunction, who presented with three-day history of  intermittent chest pain described as heaviness, midsternal, associated with  shortness of breath, dyspnea on exertion, orthopnea, and PND.  She has noted  a 7 pound weight gain over the past four days.  She has also noted some  palpitations.  The patient had been admitted with similar symptoms in the  past, most recently in March 2003.  She is having no chest pain at this  time.   PAST MEDICAL HISTORY:  1. Hypertension.  2. Diastolic dysfunction.  3. Obesity.  4. Rheumatoid arthritis.  5. Elevated blood sugar on this occasion.   ALLERGIES:  None.   HOME MEDICATIONS:  1. Plaquenil 240.  2. Lasix 40 t.i.d.  3. Clonidine 0.2 t.i.d.  4. Toprol-XL 25 b.i.d.   There is some question about compliance.   HOSPITAL MEDICATIONS:  In the hospital she is now on:  1. Aspirin 325.  2. Isordil 20 b.i.d.  3. KCl.  4. Lasix 60.  5. Verapamil 140.  6. Clonidine 0.2 t.i.d.  7. Lopressor 25 b.i.d.  8. Nitroglycerin p.r.n.   The patient states that her pain was somewhat improved when she took  aspirin.   CARDIAC HISTORY:  She had a previous Cardiolite a few years ago which  apparently was negative.  She has had an abnormal EKG with anterior T wave  changes.  She also is quite obese.  EKG on admission revealed minor  nonspecific Q changes which are somewhat more prominent today.   OTHER PAST  HISTORY:  Also reveals she has had rheumatoid arthritis.   SURGICAL HISTORY:  She had a hysterectomy 23 years ago.   SOCIAL HISTORY:  She lives in [place], married.  Has five children and 12  grandchildren.  She works as a housewife.   DIET:  She is on a low fat/low sodium diet.   FAMILY HISTORY:  Mother died at 44 of heart attack, CVA, and hypertension.  Father is 67, has hypertension.   REVIEW OF SYSTEMS:  CARDIOPULMONARY:  As noted above.  GU:  She has  frequency.  NEURO:  She has some numbness in her hands.  MUSCULOSKELETAL:  History of arthralgias.   PHYSICAL EXAMINATION:  VITAL SIGNS:  Blood pressure 153/83, 219/119 on  admission.  Intake and output -60 over the past 24 hours.  Pulse 55,  normal  sinus rhythm.  Respirations 20.  GENERAL:  The patient is obese.  NECK:  JVP is not visible.  There are no bruits over the carotid arteries.  LUNGS:  Clear.  CARDIAC:  Reveals no murmur, no gallop.  ABDOMEN:  Unremarkable except for obesity.  SKIN:  Unremarkable.  EXTREMITIES:  No significant edema.  NEUROLOGIC:  Unremarkable.   LABORATORY DATA:  Chest x-ray:  Stable cardiomegaly, mild pulmonary vascular  congestion, chronic bronchitis.  EKG:  Nonspecific Q changes.   Labs:  Hemoglobin 12.9; hematocrit 38.7; platelets 361,000; wbc 10,000.  Sodium 137, potassium 3.9, chloride 103, CO2 31, BUN 14, creatinine 0.8,  blood sugar 108.  CK negative x3; troponin negative x3.  INR 0.9.   IMPRESSION:  1. Recurrent chest pain with negative enzymes x3.  There is abnormal EKG;     this has been noted before.  2. Hypertension, poorly controlled.  3. Mild congestive heart failure in the setting of diastolic dysfunction.  4. Question of diabetes.    RECOMMENDATIONS:  I suggest to continue the present therapy though I think I  would increase the Lopressor to 50 b.i.d. and discontinue the Isordil.  A 2-  D echo is pending and also plan stress Cardiolite.   Thank you for the opportunity  to evaluate this nice patient with you.                                               Cecil Cranker, M.D. [facility]    EJL/MEDQ  D:  06/03/2002  T:  06/03/2002  Job:  [id number]   cc:   Ramon Dredge L. Juanetta Gosling, M.D.  [address]  [place]  [place] [address]  Fax: [phone]

## 2010-10-06 NOTE — Group Therapy Note (Signed)
NAMEHAILEIGH, [name]              ACCOUNT NO.:  [id number]   MEDICAL RECORD NO.:  [id number]          PATIENT TYPE:  INP   LOCATION:                                FACILITY:  [facility]   PHYSICIAN:  Edward L. Juanetta Gosling, M.D.DATE OF BIRTH:  10-20-1955   DATE OF PROCEDURE:  DATE OF DISCHARGE:                                   PROGRESS NOTE   PROBLEM:  Morbid obesity, hypertension, congestive heart failure related to  hypertension, cellulitis of the abdominal wall.   SUBJECTIVE:  [name] says she is feeling better.  She had some fever  yesterday but this morning she is afebrile at 98.6.  She says that otherwise  her abdomen feels better and she basically feels better in general.  Her  temperature is 98.6, pulse 60, respirations 26, blood sugar 161, blood  pressure 140/83, O2 saturations 94%.  Her chest is clear.  Her abdomen looks  much improved.   ASSESSMENT:  She is better.   PLAN:  I am going to continue with her IV antibiotics, etc.  She may be able  to go home tomorrow depending on how she does with all this.  I have  discussed this at some length with her.  She will need home health services  if she is able to go home.      Edward L. Juanetta Gosling, M.D.  Electronically Signed     ELH/MEDQ  D:  09/08/2005  T:  09/10/2005  Job:  [id number]

## 2010-10-06 NOTE — Group Therapy Note (Signed)
[name], [name]              ACCOUNT NO.:  [id number]   MEDICAL RECORD NO.:  [id number]          PATIENT TYPE:  INP   LOCATION:  A306                          FACILITY:  [facility]   PHYSICIAN:  Edward L. Juanetta Gosling, M.D.DATE OF BIRTH:  1956/04/14   DATE OF PROCEDURE:  09/09/2005  DATE OF DISCHARGE:  09/09/2005                                   PROGRESS NOTE   PROBLEMS:  1.  Hypertension.  2.  Congestive heart failure related to diastolic dysfunction of the heart.  3.  Abdominal cellulitis.  4.  Morbid obesity.  5.  Diabetes.   SUBJECTIVE:  [name] says she feels much better and wants to go home.  She has no new complaints.   Her temperature is 98.1, pulse 58, respirations 24, blood sugar 139, blood  pressure 146/78. Her chest is clear. Heart is regular.  Her abdomen is soft.   ASSESSMENT:  She is much improved.   PLAN:  To discharge home today. Please see discharge summary for details.      Edward L. Juanetta Gosling, M.D.  Electronically Signed     ELH/MEDQ  D:  09/09/2005  T:  09/11/2005  Job:  [id number]

## 2010-10-06 NOTE — H&P (Signed)
[name], [name]                          ACCOUNT NO.:  [id number]   MEDICAL RECORD NO.:  [id number]                   PATIENT TYPE:  INP   LOCATION:  A224                                 FACILITY:  [facility]   PHYSICIAN:  Sarita Bottom, M.D.                  DATE OF BIRTH:  06/07/1955   DATE OF ADMISSION:  06/02/2002  DATE OF DISCHARGE:                                HISTORY & PHYSICAL   PRIMARY MEDICAL DOCTOR:  Oneal Deputy. Juanetta Gosling, M.D.   CHIEF COMPLAINT:  I have chest pain for 2 days.   HISTORY OF PRESENT ILLNESS:  The patient is a 55 year old lady with a  history of hypertension and congestive heart failure, she says she has been  having chest pain for the past 2 days associated with shortness of breath.  She describes the pain as a pressure-like pain, it is constant, retrosternal  in location, initially was 6/10 but has gone down to 2/10 after she had been  given aspirin in the emergency room.  The pain does not radiate.  She  decided to come to the emergency room today because of a question of her  symptoms.   REVIEW OF SYSTEMS:  She admits to palpitations and nausea.  Denies any  vomiting, diarrhea, or any dizziness.  She denies any swelling of her legs  at the moment.  All other systems reviewed and were negative.   PAST MEDICAL HISTORY:  She has hypertension, she has congestive heart  failure, her last echocardiogram was done at [facility] about 1  year ago.   PAST SURGICAL HISTORY:  She had hysterectomy 23 years ago for fibroid  uterus.   MEDICATIONS:  She is on verapamil 240 mg once daily, metoprolol 25 mg  b.i.d., Lasix 40 mg three times daily, clonidine 0.2 mg three times daily.   ALLERGIES:  She has no known drug allergies.   FAMILY HISTORY:  Family history significant for diabetes mellitus in her  sister.  Her mother had hypertension.   SOCIAL HISTORY:  She is married with five children.  She does not smoke or  drink alcohol.  She does not use any  illicit drugs.   PHYSICAL EXAMINATION:  VITAL SIGNS:  Blood pressure is 161/93, heart rate of  61.  GENERAL:  She is an obese middle-aged lady lying comfortably on a stretcher  not in any apparent distress.  HEAD/EARS/NOSE AND THROAT:  She is not pale.  She is anicteric.  Pupils are  equal and reactive to light and accomodation.  She wears dentures.  Oral  mucosa is moist.  NECK:  Her neck is supple.  There is no lymphadenopathy.  There is no  thyromegaly.  There is no jugular venous distention.  CHEST:  Air entry is reduced bibasally.  She has few bibasal rales.  She has  no expiratory wheezes.  She has tenderness in her anterior  chest wall.  CVS:  Heart sounds 1 and 2 sound normal.  Rhythm is regular.  No murmurs are  appreciated.  PMI is not localized.  ABDOMEN:  Obese.  Bowel sounds are present.  No tenderness on deep  palpation.  CNS:  She is alert and oriented x3.  She has no gross or focal neurological  deficits.  EXTREMITIES:  She has no pedal edema.   LABORATORY DATA:  EKG with sinus rhythm at 66 beats per minute, she has a  third-degree AV block, she has normal electrical _______________, she has  some T-wave inversion in lead aVL and V4, there are no acute ST wave  changes.  Her sodium is 135, potassium is 3.4, chloride is 102, CO2 is 26,  BUN of 13, creatinine of 0.9, glucose of 125, calcium of 9.2.  WBC's 10,  hemoglobin of 12.9, MCV of 83.7, platelet count of 361.  She has no normal  differential.  Her CK is 99, MB fraction and troponin I pending.  PT is  12.9, PTT is 29, INR is 0.9.  Chest x-ray is significant for cardiomegaly.  She has pulmonary vascular congestion and chronic bronchitic changes.   ASSESSMENT AND PLAN:  Problem 1. CHEST PAIN IN AN OBESE AND HYPERTENSIVE  LADY.  She will be admitted to telemetry to rule out acute myocardial  infarction.  We will do serial cardiac enzymes and EKG every 6 hours .  She  will be given nitroglycerin 0.4 mg sublingually  p.r.n. for chest pain and I  will also put her on aspirin 325 once daily and metoprolol 25 mg b.i.d.  I  also will give her Isordil 20 mg b.i.d. and we will call cardiology for  further evaluation.   Problem 2. HYPERTENSION.  For her hypertension, which is uncontrolled at the  moment, we will continue the patient's verapamil 240 mg once daily and also  her metoprolol 25 b.i.d. and clonidine 0.5 mg three times daily, also put  her on a 2-gram sodium diet and adjust her medications according to her  blood pressure.   Problem 3. CONGESTIVE HEART FAILURE.  I ordered a two-dimensional echo and I  will increase her Lasix to 60 mg q.12h.   Problem 4. OBESITY.  She has been consulted on exercise and a low calorie  diet.  I will send a lipid profile at this time.   Problem 5. SLIGHT HYPOKALEMIA.  This can be corrected with oral K-Dur 20 mEq  two times a day.  We will monitor her BMET tomorrow morning.   Problem 6. TOBACCO.  Tobacco has been discussed with the patient and the  patient's relatives seem agreeable to the plan.  The patient will be  admitted under the care of primary care Dr. Juanetta Gosling.  Further workup and  management will depend on clinical course.                                               Sarita Bottom, M.D.    DW/MEDQ  D:  06/02/2002  T:  06/02/2002  Job:  [id number]

## 2010-10-06 NOTE — Group Therapy Note (Signed)
[facility]  Patient:    [name], [name] Visit Number: [id number] MRN: [id number]          Service Type: MED Location: 2A A218 01 Attending Physician:  Hilario Quarry Dictated by:   Kari Baars, M.D. Admit Date:  09/08/2001                               Progress Note  PROBLEMS:  Hypertension, congestive heart failure, massive obesity, knee pain, viral syndrome.  SUBJECTIVE:  [name] says she is still having some headache.  Otherwise, she is a bit better.  She is not having the aching all over that she had before.  PHYSICAL EXAMINATION  VITAL SIGNS:  Weight down about 5 pounds from admission which I suspect is from fluid retention.  Blood pressure, however, is 170/120 this morning.  CHEST:  Relatively clear.  HEART:  Regular.  She is coughing during the examination.  ASSESSMENT:  She continues to have problems with control of her blood pressure.  PLAN:  Continue Lasix.  Continue with her Catapres, but make it t.i.d. Increase her verapamil to b.i.d.  There is some chance that she will be able to go home tomorrow if we can get her pressure under control.  I have advised her to apply for disability as I think that she is clearly disabled, if for nothing else, from her obesity.  Her knee pain was investigated with an x-ray which shows what may be a bone cyst and I suggested an MRI.  She says that she will not be able to tolerate that.  No treatment at this point. Dictated by:   Kari Baars, M.D. Attending Physician:  Hilario Quarry DD:  09/10/01 TD:  09/10/01 Job: [id number]

## 2010-12-01 ENCOUNTER — Emergency Department (HOSPITAL_COMMUNITY)
Admission: EM | Admit: 2010-12-01 | Discharge: 2010-12-01 | Disposition: A | Discharge status: Home Health | Payer: BC Managed Care – PPO | Attending: Emergency Medicine | Admitting: Emergency Medicine

## 2010-12-01 ENCOUNTER — Emergency Department (HOSPITAL_COMMUNITY): Payer: BC Managed Care – PPO

## 2010-12-01 ENCOUNTER — Other Ambulatory Visit: Payer: Self-pay

## 2010-12-01 ENCOUNTER — Encounter: Payer: Self-pay | Admitting: *Deleted

## 2010-12-01 DIAGNOSIS — Z87891 Personal history of nicotine dependence: Secondary | ICD-10-CM | POA: Insufficient documentation

## 2010-12-01 DIAGNOSIS — R209 Unspecified disturbances of skin sensation: Secondary | ICD-10-CM | POA: Insufficient documentation

## 2010-12-01 DIAGNOSIS — R5381 Other malaise: Secondary | ICD-10-CM | POA: Insufficient documentation

## 2010-12-01 DIAGNOSIS — R5383 Other fatigue: Secondary | ICD-10-CM | POA: Insufficient documentation

## 2010-12-01 DIAGNOSIS — J4489 Other specified chronic obstructive pulmonary disease: Secondary | ICD-10-CM | POA: Insufficient documentation

## 2010-12-01 DIAGNOSIS — I10 Essential (primary) hypertension: Secondary | ICD-10-CM | POA: Insufficient documentation

## 2010-12-01 DIAGNOSIS — I2510 Atherosclerotic heart disease of native coronary artery without angina pectoris: Secondary | ICD-10-CM | POA: Insufficient documentation

## 2010-12-01 DIAGNOSIS — R202 Paresthesia of skin: Secondary | ICD-10-CM

## 2010-12-01 DIAGNOSIS — J449 Chronic obstructive pulmonary disease, unspecified: Secondary | ICD-10-CM | POA: Insufficient documentation

## 2010-12-01 DIAGNOSIS — Z950 Presence of cardiac pacemaker: Secondary | ICD-10-CM | POA: Insufficient documentation

## 2010-12-01 DIAGNOSIS — I509 Heart failure, unspecified: Secondary | ICD-10-CM | POA: Insufficient documentation

## 2010-12-01 DIAGNOSIS — E119 Type 2 diabetes mellitus without complications: Secondary | ICD-10-CM | POA: Insufficient documentation

## 2010-12-01 HISTORY — DX: Morbid (severe) obesity due to excess calories: E66.01

## 2010-12-01 HISTORY — DX: Chronic obstructive pulmonary disease, unspecified: J44.9

## 2010-12-01 LAB — CBC
Hemoglobin: 9.9 g/dL — ABNORMAL LOW (ref 12.0–15.0)
MCH: 28 pg (ref 26.0–34.0)
MCHC: 31.2 g/dL (ref 30.0–36.0)
Platelets: 274 10*3/uL (ref 150–400)
RDW: 16.6 % — ABNORMAL HIGH (ref 11.5–15.5)

## 2010-12-01 LAB — BASIC METABOLIC PANEL
Calcium: 9.5 mg/dL (ref 8.4–10.5)
GFR calc Af Amer: 52 mL/min — ABNORMAL LOW (ref >60)
GFR calc non Af Amer: 43 mL/min — ABNORMAL LOW (ref >60)
Potassium: 4.2 mEq/L (ref 3.5–5.1)
Sodium: 140 mEq/L (ref 135–145)

## 2010-12-01 LAB — CARDIAC PANEL(CRET KIN+CKTOT+MB+TROPI)
CK, MB: 1.4 ng/mL (ref 0.3–4.0)
Total CK: 47 U/L (ref 7–177)
Troponin I: <0.3 ng/mL (ref <0.30)

## 2010-12-01 LAB — PROTIME-INR: INR: 1.05 (ref 0.00–1.49)

## 2010-12-01 NOTE — ED Notes (Signed)
Pt states nausea for a couple of days. Non-productive cough. Twitching all over, especially to face "for a while now" Pt also states she felt a pop in her back this morning then the sensation of hot hair throughout her body. NAD. PT talking with family at bedside.

## 2010-12-01 NOTE — ED Notes (Signed)
[facility] called out for sick call, pt states she hasn't felt well in the last 2 weeks and has burning sensation all over body

## 2010-12-01 NOTE — ED Provider Notes (Signed)
History     Chief Complaint  Patient presents with  . Fatigue    [facility] called out for sick call, pt told ems that she just doesn't feel well and feeling of burning all over body   HPI Per pt, c/o gradual onset and persistence of intermittent non-productive cough, and generalized "muscle twitching" for the past 2 weeks.  Pt also c/o gradual onset and persistence of constant chest pain, back pain, and "burning feeling all over my body" x2-3 days.  Denies any change in her symptoms over the past several weeks.  Denies palpitations, no SOB, no abd pain, no N/V/D, no fevers, no tingling/numbness in extremities, no focal motor weakness.   Past Medical History  Diagnosis Date  . Morbid obesity   . Diabetes mellitus   . Hypertension   . CHF (congestive heart failure)   . Pacemaker   . COPD (chronic obstructive pulmonary disease)   . Oxygen dependent   . Pickwickian syndrome   . Chronic back pain   . Coronary artery disease     Past Surgical History  Procedure Date  . Abdominal hysterectomy   . Pacemaker insertion     History reviewed. No pertinent family history.  History  Substance Use Topics  . Smoking status: Former Games developer  . Smokeless tobacco: Never Used  . Alcohol Use: No   Pt lives home with her family, has not ambulated in "at least the past year" per pt and family due to generalized weakness.   OB History    Grav Para Term Preterm Abortions TAB SAB Ect Mult Living                  Review of Systems ROS: Statement: All systems negative except as marked or noted in the HPI; Constitutional: Negative for fever and chills. ; ; Eyes: Negative for eye pain and discharge. ; ; ENMT: Negative for ear pain, hoarseness, nasal congestion, sinus pressure and sore throat. ; ; Cardiovascular: +CP, Negative for palpitations, diaphoresis,and peripheral edema. ; ; Respiratory: +cough. Negative for SOB, wheezing and stridor. ; ; Gastrointestinal: Negative for nausea, vomiting, diarrhea  and abdominal pain. ; Genitourinary: Negative for dysuria, flank pain and hematuria. ; ; Musculoskeletal: +back pain.  Negative for neck pain.; Skin: Negative for rash and skin lesion. ; ; Neuro: Negative for headache, lightheadedness and neck stiffness. ;   Physical Exam  BP 167/82  Pulse 67  Temp(Src) 98.8 F (37.1 C) (Oral)  Resp 20  SpO2 97% BP 155/76  Pulse 65  Temp(Src) 98.8 F (37.1 C) (Oral)  Resp 16  SpO2 98%   Physical Exam  Physical examination: Vital signs and O2 SAT: Reviewed; Constitutional: Well developed, Well nourished, Well hydrated, In no acute distress; Eyes: EOMI, PERRL, No scleral icterus; ENMT: Mouth and pharynx normal, Mucous membranes moist; Neck: Supple, Full range of motion, No lymphadenopathy; Cardiovascular: Regular rate and rhythm, No murmur, rub, or gallop; Respiratory: Breath sounds clear & equal bilaterally, No rales, rhonchi, wheezes, or rub, Normal respiratory effort/excursion; Chest: +TTP right and left parasternal and chest wall areas, Movement normal; Abdomen: Soft, Nontender, Nondistended, Normal bowel sounds; Extremities: Pulses normal, No tenderness, 1+ pedal edema bilat, No calf edema or asymmetry.; Neuro: AA&Ox3, Major CN grossly intact.  No gross focal motor or sensory deficits in extremities.; Skin: Color normal, Warm, Dry; sacral and buttocks areas with large well healed scar and without skin breakdown or erythema. No midline CS, TS, LS tenderness. +TTP bilat thoracic and lumbar paraspinal muscles.  ED Course  Procedures   Date: 12/01/2010  Rate: 66  Rhythm: normal sinus rhythm  QRS Axis: left  Intervals: normal  ST/T Wave abnormalities: normal  Conduction Disutrbances:none  Narrative Interpretation:   Old EKG Reviewed: unchanged from 01/04/10 and 10/08/09, both EKG's with Q-waves in inf and ant leads   MDM  MDM Reviewed: previous chart, nursing note and vitals Reviewed previous: labs and ECG Interpretation: labs, ECG and  x-ray Consults: social services   Labs Reviewed  CBC - Abnormal; Notable for the following:    RBC 3.53 (*)    Hemoglobin 9.9 (*)    HCT 31.7 (*)    RDW 16.6 (*)    All other components within normal limits  BASIC METABOLIC PANEL - Abnormal; Notable for the following:    Chloride 91 (*)    CO2 38 (*)    Glucose, Bld 169 (*)    BUN 26 (*)    Creatinine, Ser 1.28 (*) **Please note change in reference range.**   GFR calc non Af Amer 43 (*)    GFR calc Af Amer 52 (*)    All other components within normal limits  CARDIAC PANEL(CRET KIN+CKTOT+MB+TROPI)  PROTIME-INR  PRO B NATRIURETIC PEPTIDE    H/H at pt's baseline (9 - 10.2) Cr mildly elevated with last labs 12/2009 Cr 1.04   Pt wants to go home now.  Pt with very vague multiple complaints today that have been ongoing for the past several days, to weeks, to months.  Has tol PO well in ED without N/V, VS remain stable, no tachycardia or hypoxia.  Today's labs without clinically significant abnl.  Dx testing d/w pt and family.  Questions answered.  Verb understanding, agreeable to d/c home with outpt f/u.  Social Worker to see pt before d/c with home health consult ordered.         Laray Anger, MD 12/01/10 [phone]

## 2010-12-01 NOTE — ED Notes (Signed)
Social worker call per pt request. Social worker to see pt to discuss options for home care.

## 2010-12-01 NOTE — ED Notes (Signed)
Pt resting quietly. Lights turned down. Pt has eaten meal. Family remain at bedside. VSS . NAD.

## 2010-12-01 NOTE — ED Notes (Signed)
Pt in NAD. Currently eating diabetic meal and talking with family at bedside. PT and family are pleasant. VSS.

## 2011-01-19 ENCOUNTER — Other Ambulatory Visit: Payer: Self-pay | Admitting: Internal Medicine

## 2011-02-07 LAB — COMPREHENSIVE METABOLIC PANEL
ALT: 17
AST: 23
CO2: 33 — ABNORMAL HIGH
Chloride: 98
Creatinine, Ser: 0.94
GFR calc Af Amer: >60
GFR calc non Af Amer: >60
Glucose, Bld: 134 — ABNORMAL HIGH
Total Bilirubin: 0.4

## 2011-02-07 LAB — CARDIAC PANEL(CRET KIN+CKTOT+MB+TROPI)
CK, MB: 0.5
CK, MB: 0.8
CK, MB: 1
Relative Index: 0.9
Relative Index: INVALID
Total CK: 106
Total CK: 87

## 2011-02-07 LAB — CBC
HCT: 37.8
Hemoglobin: 12.5
MCHC: 33.2
MCV: 83.8
RBC: 4.51
WBC: 9.9

## 2011-02-07 LAB — POCT CARDIAC MARKERS: Operator id: 286941

## 2011-02-07 LAB — DIFFERENTIAL
Basophils Absolute: 0
Eosinophils Absolute: 0.3
Eosinophils Relative: 3
Lymphocytes Relative: 12
Neutrophils Relative %: 78 — ABNORMAL HIGH

## 2011-02-07 LAB — D-DIMER, QUANTITATIVE: D-Dimer, Quant: 2.44 — ABNORMAL HIGH

## 2011-02-08 LAB — D-DIMER, QUANTITATIVE: D-Dimer, Quant: 1.73 — ABNORMAL HIGH

## 2011-02-08 LAB — BASIC METABOLIC PANEL
Calcium: 9.4
GFR calc Af Amer: >60
GFR calc non Af Amer: >60
Sodium: 139

## 2011-02-08 LAB — DIFFERENTIAL
Basophils Relative: 1
Eosinophils Absolute: 0.3
Eosinophils Relative: 3
Neutrophils Relative %: 73

## 2011-02-08 LAB — CBC
Hemoglobin: 12.4
RBC: 4.56
RDW: 16 — ABNORMAL HIGH
WBC: 8.7

## 2011-02-08 LAB — POCT CARDIAC MARKERS: Operator id: 264761

## 2011-02-08 LAB — RAPID URINE DRUG SCREEN, HOSP PERFORMED
Amphetamines: NOT DETECTED
Barbiturates: NOT DETECTED
Benzodiazepines: NOT DETECTED
Cocaine: NOT DETECTED

## 2011-02-09 LAB — CULTURE, RESPIRATORY W GRAM STAIN: Culture: NO GROWTH

## 2011-02-09 LAB — COMPREHENSIVE METABOLIC PANEL
ALT: 19
ALT: 17
AST: 17
Alkaline Phosphatase: 55
Calcium: 8.7
Calcium: 9.5
GFR calc Af Amer: >60
GFR calc Af Amer: >60
Glucose, Bld: 132 — ABNORMAL HIGH
Glucose, Bld: 123 — ABNORMAL HIGH
Potassium: 4.1
Sodium: 139
Sodium: 139
Total Protein: 6.5
Total Protein: 6.7

## 2011-02-09 LAB — BLOOD GAS, ARTERIAL
Acid-Base Excess: 4 — ABNORMAL HIGH
Acid-Base Excess: 6 — ABNORMAL HIGH
Acid-Base Excess: 6.2 — ABNORMAL HIGH
Bicarbonate: 29.5 — ABNORMAL HIGH
Bicarbonate: 30.6 — ABNORMAL HIGH
Bicarbonate: 33.5 — ABNORMAL HIGH
Bicarbonate: 33.9 — ABNORMAL HIGH
Delivery systems: POSITIVE
FIO2: 40
MECHVT: 500
O2 Content: 3
O2 Content: 3
O2 Content: 3
O2 Saturation: 89.8
O2 Saturation: 91.3
O2 Saturation: 96.2
O2 Saturation: 97.8
O2 Saturation: 99.8
PEEP: 5
PEEP: 5
PEEP: 5
PEEP: 8
Patient temperature: 37
Patient temperature: 37
Patient temperature: 37
Patient temperature: 37
Patient temperature: 37
Patient temperature: 39.2
Pressure support: 12
Pressure support: 12
RATE: 18
RATE: 18
TCO2: 28.2
TCO2: 30.6
TCO2: 30.9
pCO2 arterial: 47.2 — ABNORMAL HIGH
pCO2 arterial: 52.7 — ABNORMAL HIGH
pCO2 arterial: 58.6
pCO2 arterial: 72.1
pCO2 arterial: 86.6
pCO2 arterial: 90.4
pH, Arterial: 7.191 — CL
pH, Arterial: 7.278 — ABNORMAL LOW
pH, Arterial: 7.36
pH, Arterial: 7.368
pH, Arterial: 7.381
pH, Arterial: 7.384
pH, Arterial: 7.399
pH, Arterial: 7.421 — ABNORMAL HIGH
pO2, Arterial: 61.6 — ABNORMAL LOW
pO2, Arterial: 66.8 — ABNORMAL LOW
pO2, Arterial: 70.9 — ABNORMAL LOW
pO2, Arterial: 84.1

## 2011-02-09 LAB — BASIC METABOLIC PANEL
BUN: 12
BUN: 9
Calcium: 8.3 — ABNORMAL LOW
Calcium: 8.5
Calcium: 8.8
Calcium: 9.4
Chloride: 98
Creatinine, Ser: 0.7
Creatinine, Ser: 0.86
Creatinine, Ser: 1.1
GFR calc Af Amer: >60
GFR calc Af Amer: >60
GFR calc Af Amer: >60
GFR calc Af Amer: >60
GFR calc non Af Amer: >60
GFR calc non Af Amer: >60
GFR calc non Af Amer: >60
GFR calc non Af Amer: >60
Glucose, Bld: 110 — ABNORMAL HIGH
Glucose, Bld: 123 — ABNORMAL HIGH
Glucose, Bld: 92
Potassium: 3 — ABNORMAL LOW
Potassium: 3.7
Sodium: 137
Sodium: 138

## 2011-02-09 LAB — CULTURE, BLOOD (ROUTINE X 2): Report Status: 2222009

## 2011-02-09 LAB — DIFFERENTIAL
Basophils Absolute: 0
Basophils Absolute: 0
Basophils Relative: 0
Basophils Relative: 0
Basophils Relative: 0
Eosinophils Absolute: 0
Eosinophils Absolute: 0.1
Eosinophils Absolute: 0.2
Eosinophils Absolute: 0.3
Eosinophils Relative: 0
Eosinophils Relative: 3
Lymphocytes Relative: 15
Lymphocytes Relative: 3 — ABNORMAL LOW
Lymphocytes Relative: 7 — ABNORMAL LOW
Lymphocytes Relative: 9 — ABNORMAL LOW
Lymphs Abs: 0.6 — ABNORMAL LOW
Lymphs Abs: 0.8
Lymphs Abs: 0.9
Lymphs Abs: 1.1
Lymphs Abs: 1.2
Lymphs Abs: 1
Monocytes Absolute: 0.4
Monocytes Absolute: 0.6
Monocytes Absolute: 1.3 — ABNORMAL HIGH
Monocytes Absolute: 1
Monocytes Relative: 10
Monocytes Relative: 11
Monocytes Relative: 11
Monocytes Relative: 6
Monocytes Relative: 8
Monocytes Relative: 11
Neutro Abs: 11 — ABNORMAL HIGH
Neutro Abs: 5.9
Neutro Abs: 6.4
Neutro Abs: 7.3
Neutro Abs: 9.1 — ABNORMAL HIGH
Neutrophils Relative %: 71
Neutrophils Relative %: 72
Neutrophils Relative %: 80 — ABNORMAL HIGH
Neutrophils Relative %: 81 — ABNORMAL HIGH
Neutrophils Relative %: 88 — ABNORMAL HIGH
Neutrophils Relative %: 92 — ABNORMAL HIGH

## 2011-02-09 LAB — CARDIAC PANEL(CRET KIN+CKTOT+MB+TROPI)
CK, MB: 1.3
CK, MB: 1.3
Relative Index: 0.4
Relative Index: 0.4
Relative Index: 0.5
Troponin I: 0.07 — ABNORMAL HIGH
Troponin I: 0.09 — ABNORMAL HIGH

## 2011-02-09 LAB — CBC
HCT: 30 — ABNORMAL LOW
HCT: 31.1 — ABNORMAL LOW
Hemoglobin: 10.5 — ABNORMAL LOW
Hemoglobin: 11.3 — ABNORMAL LOW
Hemoglobin: 11.5 — ABNORMAL LOW
Hemoglobin: 12.5
Hemoglobin: 10.5 — ABNORMAL LOW
MCHC: 33.2
MCHC: 33.6
MCHC: 33.7
MCHC: 33.7
MCHC: 33.5
MCV: 83.9
MCV: 84.3
Platelets: 253
Platelets: 258
Platelets: 276
Platelets: 308
RBC: 3.55 — ABNORMAL LOW
RBC: 3.57 — ABNORMAL LOW
RBC: 3.68 — ABNORMAL LOW
RBC: 4.08
RBC: 3.72 — ABNORMAL LOW
RDW: 15.9 — ABNORMAL HIGH
RDW: 16 — ABNORMAL HIGH
RDW: 16.7 — ABNORMAL HIGH
RDW: 15.7 — ABNORMAL HIGH
WBC: 10.3
WBC: 11.2 — ABNORMAL HIGH
WBC: 8
WBC: 8.2
WBC: 9

## 2011-02-09 LAB — URINE CULTURE: Special Requests: NEGATIVE

## 2011-02-09 LAB — D-DIMER, QUANTITATIVE: D-Dimer, Quant: 2.1 — ABNORMAL HIGH

## 2011-02-09 LAB — B-NATRIURETIC PEPTIDE (CONVERTED LAB): Pro B Natriuretic peptide (BNP): <30

## 2011-02-09 LAB — PROTIME-INR
INR: 1
INR: 1
Prothrombin Time: 13.1

## 2011-02-09 LAB — POCT CARDIAC MARKERS: Troponin i, poc: <0.05

## 2011-02-12 LAB — DIFFERENTIAL
Basophils Relative: 0
Eosinophils Absolute: 0.4
Monocytes Absolute: 0.6
Monocytes Relative: 6
Neutro Abs: 6.8

## 2011-02-12 LAB — CBC
MCHC: 32.4
MCV: 84.1
Platelets: 315

## 2011-02-12 LAB — BASIC METABOLIC PANEL
BUN: 9
CO2: 25
Chloride: 101
Creatinine, Ser: 0.66

## 2011-02-12 LAB — CK TOTAL AND CKMB (NOT AT ARMC): Relative Index: INVALID

## 2011-02-26 ENCOUNTER — Inpatient Hospital Stay (HOSPITAL_COMMUNITY)
Admission: EM | Admit: 2011-02-26 | Discharge: 2011-03-02 | DRG: 603 | Disposition: A | Discharge status: Home Health | Payer: Medicaid Other | Attending: Internal Medicine | Admitting: Internal Medicine

## 2011-02-26 ENCOUNTER — Emergency Department (HOSPITAL_COMMUNITY): Payer: Medicaid Other

## 2011-02-26 ENCOUNTER — Encounter (HOSPITAL_COMMUNITY): Payer: Self-pay | Admitting: Emergency Medicine

## 2011-02-26 DIAGNOSIS — E785 Hyperlipidemia, unspecified: Secondary | ICD-10-CM | POA: Diagnosis present

## 2011-02-26 DIAGNOSIS — Z6841 Body Mass Index (BMI) 40.0 and over, adult: Secondary | ICD-10-CM

## 2011-02-26 DIAGNOSIS — D509 Iron deficiency anemia, unspecified: Secondary | ICD-10-CM | POA: Diagnosis present

## 2011-02-26 DIAGNOSIS — J9610 Chronic respiratory failure, unspecified whether with hypoxia or hypercapnia: Secondary | ICD-10-CM | POA: Diagnosis present

## 2011-02-26 DIAGNOSIS — L03119 Cellulitis of unspecified part of limb: Secondary | ICD-10-CM | POA: Diagnosis present

## 2011-02-26 DIAGNOSIS — Z950 Presence of cardiac pacemaker: Secondary | ICD-10-CM

## 2011-02-26 DIAGNOSIS — Z23 Encounter for immunization: Secondary | ICD-10-CM

## 2011-02-26 DIAGNOSIS — M545 Low back pain, unspecified: Secondary | ICD-10-CM | POA: Diagnosis present

## 2011-02-26 DIAGNOSIS — I509 Heart failure, unspecified: Secondary | ICD-10-CM | POA: Diagnosis present

## 2011-02-26 DIAGNOSIS — I10 Essential (primary) hypertension: Secondary | ICD-10-CM | POA: Diagnosis present

## 2011-02-26 DIAGNOSIS — IMO0002 Reserved for concepts with insufficient information to code with codable children: Secondary | ICD-10-CM | POA: Diagnosis present

## 2011-02-26 DIAGNOSIS — E119 Type 2 diabetes mellitus without complications: Secondary | ICD-10-CM | POA: Diagnosis present

## 2011-02-26 DIAGNOSIS — I5032 Chronic diastolic (congestive) heart failure: Secondary | ICD-10-CM | POA: Diagnosis present

## 2011-02-26 DIAGNOSIS — M179 Osteoarthritis of knee, unspecified: Secondary | ICD-10-CM | POA: Diagnosis present

## 2011-02-26 DIAGNOSIS — B372 Candidiasis of skin and nail: Secondary | ICD-10-CM | POA: Diagnosis present

## 2011-02-26 DIAGNOSIS — N179 Acute kidney failure, unspecified: Secondary | ICD-10-CM | POA: Diagnosis present

## 2011-02-26 DIAGNOSIS — E662 Morbid (severe) obesity with alveolar hypoventilation: Secondary | ICD-10-CM | POA: Diagnosis present

## 2011-02-26 DIAGNOSIS — R509 Fever, unspecified: Secondary | ICD-10-CM | POA: Diagnosis present

## 2011-02-26 DIAGNOSIS — E118 Type 2 diabetes mellitus with unspecified complications: Secondary | ICD-10-CM | POA: Diagnosis present

## 2011-02-26 DIAGNOSIS — K219 Gastro-esophageal reflux disease without esophagitis: Secondary | ICD-10-CM | POA: Diagnosis present

## 2011-02-26 DIAGNOSIS — Z9981 Dependence on supplemental oxygen: Secondary | ICD-10-CM

## 2011-02-26 DIAGNOSIS — L304 Erythema intertrigo: Secondary | ICD-10-CM | POA: Diagnosis present

## 2011-02-26 DIAGNOSIS — L02419 Cutaneous abscess of limb, unspecified: Principal | ICD-10-CM | POA: Diagnosis present

## 2011-02-26 DIAGNOSIS — J4489 Other specified chronic obstructive pulmonary disease: Secondary | ICD-10-CM | POA: Diagnosis present

## 2011-02-26 DIAGNOSIS — M1710 Unilateral primary osteoarthritis, unspecified knee: Secondary | ICD-10-CM | POA: Diagnosis present

## 2011-02-26 DIAGNOSIS — J449 Chronic obstructive pulmonary disease, unspecified: Secondary | ICD-10-CM | POA: Diagnosis present

## 2011-02-26 HISTORY — DX: Morbid (severe) obesity with alveolar hypoventilation: E66.2

## 2011-02-26 LAB — URINE MICROSCOPIC-ADD ON

## 2011-02-26 LAB — URINALYSIS, ROUTINE W REFLEX MICROSCOPIC
Glucose, UA: NEGATIVE mg/dL
Protein, ur: NEGATIVE mg/dL
pH: 5.5 (ref 5.0–8.0)

## 2011-02-26 LAB — DIFFERENTIAL
Eosinophils Absolute: 0.3 10*3/uL (ref 0.0–0.7)
Eosinophils Relative: 2 % (ref 0–5)
Lymphocytes Relative: 5 % — ABNORMAL LOW (ref 12–46)
Lymphs Abs: 0.8 10*3/uL (ref 0.7–4.0)
Monocytes Absolute: 1.1 10*3/uL — ABNORMAL HIGH (ref 0.1–1.0)
Monocytes Relative: 7 % (ref 3–12)

## 2011-02-26 LAB — CBC
HCT: 32.6 % — ABNORMAL LOW (ref 36.0–46.0)
Hemoglobin: 10.1 g/dL — ABNORMAL LOW (ref 12.0–15.0)
MCH: 28.1 pg (ref 26.0–34.0)
MCV: 90.8 fL (ref 78.0–100.0)
RBC: 3.59 MIL/uL — ABNORMAL LOW (ref 3.87–5.11)

## 2011-02-26 LAB — BASIC METABOLIC PANEL
CO2: 39 mEq/L — ABNORMAL HIGH (ref 19–32)
Calcium: 10 mg/dL (ref 8.4–10.5)
Chloride: 90 mEq/L — ABNORMAL LOW (ref 96–112)
Glucose, Bld: 111 mg/dL — ABNORMAL HIGH (ref 70–99)
Potassium: 4.3 mEq/L (ref 3.5–5.1)
Sodium: 138 mEq/L (ref 135–145)

## 2011-02-26 MED ORDER — FLECAINIDE ACETATE 100 MG PO TABS
100.0000 mg | ORAL_TABLET | Freq: Two times a day (BID) | ORAL | Status: DC
  Administered 2011-02-27 – 2011-03-02 (×7): 100 mg via ORAL
  Filled 2011-02-26 (×14): qty 1

## 2011-02-26 NOTE — ED Notes (Signed)
Fever, chill for 3 hours

## 2011-02-26 NOTE — H&P (Addendum)
PCP:   No primary provider on file.  Mirna Mires, MD  Chief Complaint:  Fever and chills, today.  HPI: [name] is an 55 y.o. female. Morbidly obese African American, multiple medical problems, including diabetes hypertension diastolic heart failure, pickwickian syndrome, and bedridden status due to deconditioning, presents with a history of fever and chills and feeling as if she is getting the flu since earlier today.  Denies cough or cold or chest pain or runny nose, but has been having generalized body ache, worsening back pain, and headache. Denies neck stiffness or photophobia.  In the emergency room patient was found to have an elevated white count, and all other issues, but because of her multiple medical problems hospitalist service was called to assist with management. Patient reports she has no health insurance and is  therefore getting no home help.  Patient allergies she's taking her medication regularly, but later in the interview it turns out, that she recently took some outdated diuretics, and we therefore wonder if she has run out of other medications as well.   Past Medical History  Diagnosis Date  . Morbid obesity   . Diabetes mellitus   . Hypertension   . CHF (congestive heart failure)   . Pacemaker   . COPD (chronic obstructive pulmonary disease)   . Oxygen dependent   . Pickwickian syndrome   . Chronic back pain   . Coronary artery disease     Past Surgical History  Procedure Date  . Abdominal hysterectomy   . Pacemaker insertion     Medications:  HOME MEDS: Prior to Admission medications   Medication Sig Start Date End Date Taking? Authorizing Provider  cloNIDine (CATAPRES) 0.2 MG tablet Take 0.2 mg by mouth 2 (two) times daily.     Yes Historical Provider, MD  flecainide (TAMBOCOR) 100 MG tablet TAKE ONE TABLET BY MOUTH TWICE DAILY 01/19/11  Yes Gerrit Friends. Rothbart, MD  furosemide (LASIX) 40 MG tablet Take 40 mg by mouth 2 (two) times daily.      Yes Historical Provider, MD  hydrALAZINE (APRESOLINE) 100 MG tablet Take 100 mg by mouth 3 (three) times daily.     Yes Historical Provider, MD  labetalol (NORMODYNE) 300 MG tablet Take 300 mg by mouth 2 (two) times daily.     Yes Historical Provider, MD  spironolactone (ALDACTONE) 25 MG tablet Take 25 mg by mouth daily.     Yes Historical Provider, MD   Glipizide 5mg  bid  PRIOR TO AMDISSION MEDS Medications Prior to Admission  Medication Dose Route Frequency Provider Last Rate Last Dose  . flecainide (TAMBOCOR) tablet 100 mg  100 mg Oral Q12H Vania Rea       Medications Prior to Admission  Medication Sig Dispense Refill  . cloNIDine (CATAPRES) 0.2 MG tablet Take 0.2 mg by mouth 2 (two) times daily.        . flecainide (TAMBOCOR) 100 MG tablet TAKE ONE TABLET BY MOUTH TWICE DAILY  60 tablet  0  . furosemide (LASIX) 40 MG tablet Take 40 mg by mouth 2 (two) times daily.        . hydrALAZINE (APRESOLINE) 100 MG tablet Take 100 mg by mouth 3 (three) times daily.        Marland Kitchen labetalol (NORMODYNE) 300 MG tablet Take 300 mg by mouth 2 (two) times daily.        Marland Kitchen spironolactone (ALDACTONE) 25 MG tablet Take 25 mg by mouth daily.  Allergies:  No Known Allergies  Social History:   reports that she has quit smoking. She has never used smokeless tobacco. She reports that she does not drink alcohol or use illicit drugs.  Bedridden due to morbid obesity and deconditioning.  Family History: Family History  Problem Relation Age of Onset  . Coronary artery disease Mother     MI age 37, died.  . Hypertension Father   . Osteoarthritis Father   . Hypertension Sister   . Hypertension Brother   . Obesity Father     Rewiew of Systems:  The patient denies anorexia, weight loss,, vision loss, decreased hearing, hoarseness, chest pain, syncope, dyspnea on exertion,  balance deficits, hemoptysis, abdominal pain, melena, hematochezia, severe indigestion/heartburn, hematuria, incontinence,  genital sores, muscle weakness, suspicious skin lesions, transient blindness, depression, unusual weight change, abnormal bleeding, enlarged lymph nodes, angioedema, and breast masses.   Physical Exam: Filed Vitals:   02/26/11 1855 02/26/11 1932 02/26/11 2041 02/26/11 2315  BP: 134/94 111/53 119/55 119/60  Pulse: 83 87 84 74  Temp: 100.2 F (37.9 C) 99.8 F (37.7 C) 99 F (37.2 C)   TempSrc: Oral Oral Oral   Resp: 18 18 20 20   Height: 5' (1.524 m)     Weight: 181.439 kg (400 lb)     SpO2: 97% 96% 97% 99%   Blood pressure 119/60, pulse 74, temperature 99 F (37.2 C), temperature source Oral, resp. rate 20, height 5' (1.524 m), weight 181.439 kg (400 lb), SpO2 99.00%.  GEN: Massively obese African American lady lying in the stretcher in no acute distress; exam limited because it is so difficult for her to move around [facility]: He is alert and oriented x4; does not appear anxious does not appear depressed; affect is normal HEENT: Mucous membranes pink dry and anicteric; PERRLA; EOM intact; no cervical lymphadenopathy nor thyromegaly or carotid bruit; no JVD; massive neck impairs exam Breasts:: Not examined CHEST WALL: Mild tenderness CHEST: Normal respiration, clear to auscultation bilaterally HEART: Regular rate and rhythm; no murmurs rubs or gallops BACK:  no CVA tenderness ABDOMEN: Massively obese Obese, soft non-tender; no masses, no organomegaly, normal abdominal bowel sounds; large pannus; extensive intertriginous candida. Rectal Exam: Not done EXTREMITIES: ;2+ edema both leg; dry and scaly no ulcerations. Genitalia: not examined PULSES: 1+ and symmetric SKIN: Normal hydration no rash or ulceration CNS: Cranial nerves 2-12 grossly bilateral lower extremity weakness   Labs & Imaging Results for orders placed during the hospital encounter of 02/26/11 (from the past 48 hour(s))  BASIC METABOLIC PANEL     Status: Abnormal   Collection Time   02/26/11  8:17 PM      Component Value  Range Comment   Sodium 138  135 - 145 (mEq/L)    Potassium 4.3  3.5 - 5.1 (mEq/L)    Chloride 90 (*) 96 - 112 (mEq/L)    CO2 39 (*) 19 - 32 (mEq/L)    Glucose, Bld 111 (*) 70 - 99 (mg/dL)    BUN 30 (*) 6 - 23 (mg/dL)    Creatinine, Ser 4.09  0.50 - 1.10 (mg/dL)    Calcium 81.1  8.4 - 10.5 (mg/dL)    GFR calc non Af Amer 59 (*) >90 (mL/min)    GFR calc Af Amer 69 (*) >90 (mL/min)   CBC     Status: Abnormal   Collection Time   02/26/11  8:17 PM      Component Value Range Comment   WBC 17.2 (*) 4.0 -  10.5 (K/uL)    RBC 3.59 (*) 3.87 - 5.11 (MIL/uL)    Hemoglobin 10.1 (*) 12.0 - 15.0 (g/dL)    HCT 40.9 (*) 81.1 - 46.0 (%)    MCV 90.8  78.0 - 100.0 (fL)    MCH 28.1  26.0 - 34.0 (pg)    MCHC 31.0  30.0 - 36.0 (g/dL)    RDW 91.4  78.2 - 95.6 (%)    Platelets 342  150 - 400 (K/uL)   DIFFERENTIAL     Status: Abnormal   Collection Time   02/26/11  8:17 PM      Component Value Range Comment   Neutrophils Relative 87 (*) 43 - 77 (%)    Neutro Abs 14.9 (*) 1.7 - 7.7 (K/uL)    Lymphocytes Relative 5 (*) 12 - 46 (%)    Lymphs Abs 0.8  0.7 - 4.0 (K/uL)    Monocytes Relative 7  3 - 12 (%)    Monocytes Absolute 1.1 (*) 0.1 - 1.0 (K/uL)    Eosinophils Relative 2  0 - 5 (%)    Eosinophils Absolute 0.3  0.0 - 0.7 (K/uL)    Basophils Relative 0  0 - 1 (%)    Basophils Absolute 0.0  0.0 - 0.1 (K/uL)   URINALYSIS, ROUTINE W REFLEX MICROSCOPIC     Status: Abnormal   Collection Time   02/26/11  9:13 PM      Component Value Range Comment   Color, Urine YELLOW  YELLOW     Appearance HAZY (*) CLEAR     Specific Gravity, Urine 1.015  1.005 - 1.030     pH 5.5  5.0 - 8.0     Glucose, UA NEGATIVE  NEGATIVE (mg/dL)    Hgb urine dipstick TRACE (*) NEGATIVE     Bilirubin Urine NEGATIVE  NEGATIVE     Ketones, ur NEGATIVE  NEGATIVE (mg/dL)    Protein, ur NEGATIVE  NEGATIVE (mg/dL)    Urobilinogen, UA 0.2  0.0 - 1.0 (mg/dL)    Nitrite NEGATIVE  NEGATIVE     Leukocytes, UA NEGATIVE  NEGATIVE    URINE  MICROSCOPIC-ADD ON     Status: Abnormal   Collection Time   02/26/11  9:13 PM      Component Value Range Comment   Squamous Epithelial / LPF MANY (*) RARE     WBC, UA 0-2  <3 (WBC/hpf)    RBC / HPF 0-2  <3 (RBC/hpf)    Bacteria, UA FEW (*) RARE     Dg Chest Portable 1 View  02/26/2011  *RADIOLOGY REPORT*  Clinical Data: Fever.  Short of breath.  Pacemaker.  PORTABLE CHEST - 1 VIEW  Comparison: 12/01/2010  Findings: The heart is enlarged.  Dual lead pacemaker remains in place.  There is venous hypertension with mild interstitial edema. No effusions.  No significant bony finding.  IMPRESSION: Interstitial edema  Original Report Authenticated By: Thomasenia Sales, M.D.      Assessment Present on Admission:  .Fever and chills .OBESITY, MORBID .LOW BACK PAIN .HYPERTENSION .GERD .HYPERLIPIDEMIA .DIABETES MELLITUS, TYPE II .ANEMIA .Chronic diastolic heart failure   PLAN:  This is likely a viral syndrome, but since she has a leukocytosis, and multiple co morbidities, will bring her i on observation and plan discharge home in the am if no disease manifests. Will withhold antibiotics.  Will repeat Urinalysis since original sample was contaminated.  Since has no insurance an this seems to be limiting her ability to get  relief from her chronic illness, will consult social worker for assistance.  Other plans as per orders.   Pernie Grosso 02/26/2011, 11:55 PM

## 2011-02-26 NOTE — ED Provider Notes (Signed)
Scribed for [name], MD, the patient was seen in room APA05/APA05 . This chart was scribed by Ellie Lunch. This patient's care was started at 7:57 PM.   CSN: [id number] Arrival date & time: 02/26/2011  7:18 PM  Chief Complaint  Patient presents with  . Fever    (Consider location/radiation/quality/duration/timing/severity/associated sxs/prior treatment) HPI [name] is a 55 y.o. female who presents to the Emergency Department complaining of subjective fever, chills, myalgias, and HA starting 4 hours ago. Pt reports Sx were sudden onset and have been constant. Denies st, cough, or dysuria. Denies any modifying factors.There are no other associated symptoms and no other alleviating or aggravating factors.   PCP Dr. Loleta Chance in [place]  Past Medical History  Diagnosis Date  . Morbid obesity   . Diabetes mellitus   . Hypertension   . CHF (congestive heart failure)   . Pacemaker   . COPD (chronic obstructive pulmonary disease)   . Oxygen dependent   . Pickwickian syndrome   . Chronic back pain   . Coronary artery disease     Past Surgical History  Procedure Date  . Abdominal hysterectomy   . Pacemaker insertion     No family history on file.  History  Substance Use Topics  . Smoking status: Former Games developer  . Smokeless tobacco: Never Used  . Alcohol Use: No   Review of Systems  Constitutional: Positive for fever and chills. Negative for fatigue.  HENT: Negative for congestion, sinus pressure and ear discharge.   Eyes: Negative for discharge.  Respiratory: Negative for cough.   Cardiovascular: Negative for chest pain.  Gastrointestinal: Negative for abdominal pain and diarrhea.  Genitourinary: Negative for dysuria, frequency and hematuria.  Musculoskeletal: Negative for back pain.  Neurological: Negative for seizures.  Hematological: Negative.   Psychiatric/Behavioral: Negative for hallucinations.  All other systems reviewed and are  negative.    Allergies  Review of patient's allergies indicates no known allergies.  Home Medications   Current Outpatient Rx  Name Route Sig Dispense Refill  . CLONIDINE HCL 0.2 MG PO TABS Oral Take 0.2 mg by mouth 2 (two) times daily.      Marland Kitchen FLECAINIDE ACETATE 100 MG PO TABS  TAKE ONE TABLET BY MOUTH TWICE DAILY 60 tablet 0    Pt need appointment  . FUROSEMIDE 40 MG PO TABS Oral Take 40 mg by mouth 2 (two) times daily.      Marland Kitchen HYDRALAZINE HCL 100 MG PO TABS Oral Take 100 mg by mouth 3 (three) times daily.      Marland Kitchen LABETALOL HCL 300 MG PO TABS Oral Take 300 mg by mouth 2 (two) times daily.      Marland Kitchen SPIRONOLACTONE 25 MG PO TABS Oral Take 25 mg by mouth daily.        BP 111/53  Pulse 87  Temp(Src) 99.8 F (37.7 C) (Oral)  Resp 18  Ht 5' (1.524 m)  Wt 400 lb (181.439 kg)  BMI 78.12 kg/m2  SpO2 96%  Physical Exam  Nursing note and vitals reviewed. Constitutional: She is oriented to person, place, and time. She appears well-developed.       Appears morbidly obese  HENT:  Head: Normocephalic and atraumatic.  Eyes: Conjunctivae and EOM are normal. No scleral icterus.  Neck: Neck supple. No thyromegaly present.  Cardiovascular: Normal rate and regular rhythm.  Exam reveals no gallop and no friction rub.   No murmur heard. Pulmonary/Chest: No stridor. She has no wheezes. She has  no rales. She exhibits no tenderness.  Abdominal: She exhibits no distension. There is no tenderness. There is no rebound.  Musculoskeletal: She exhibits no edema and no tenderness.  Lymphadenopathy:    She has no cervical adenopathy.  Neurological: She is oriented to person, place, and time. Coordination normal.  Skin: Skin is warm. Rash (rash present bilateral lower abdomen) noted.  Psychiatric: She has a normal mood and affect. Her behavior is normal.    Procedures (including critical care time)  OTHER DATA REVIEWED: Nursing notes, vital signs, and past medical records reviewed.  DIAGNOSTIC  STUDIES: Oxygen Saturation is 96% on room air, normal by my interpretation.    LABS / RADIOLOGY:  Results for orders placed during the hospital encounter of 02/26/11  URINALYSIS, ROUTINE W REFLEX MICROSCOPIC      Component Value Range   Color, Urine YELLOW  YELLOW    Appearance HAZY (*) CLEAR    Specific Gravity, Urine 1.015  1.005 - 1.030    pH 5.5  5.0 - 8.0    Glucose, UA NEGATIVE  NEGATIVE (mg/dL)   Hgb urine dipstick TRACE (*) NEGATIVE    Bilirubin Urine NEGATIVE  NEGATIVE    Ketones, ur NEGATIVE  NEGATIVE (mg/dL)   Protein, ur NEGATIVE  NEGATIVE (mg/dL)   Urobilinogen, UA 0.2  0.0 - 1.0 (mg/dL)   Nitrite NEGATIVE  NEGATIVE    Leukocytes, UA NEGATIVE  NEGATIVE   BASIC METABOLIC PANEL      Component Value Range   Sodium 138  135 - 145 (mEq/L)   Potassium 4.3  3.5 - 5.1 (mEq/L)   Chloride 90 (*) 96 - 112 (mEq/L)   CO2 39 (*) 19 - 32 (mEq/L)   Glucose, Bld 111 (*) 70 - 99 (mg/dL)   BUN 30 (*) 6 - 23 (mg/dL)   Creatinine, Ser 4.09  0.50 - 1.10 (mg/dL)   Calcium 81.1  8.4 - 10.5 (mg/dL)   GFR calc non Af Amer 59 (*) >90 (mL/min)   GFR calc Af Amer 69 (*) >90 (mL/min)  CBC      Component Value Range   WBC 17.2 (*) 4.0 - 10.5 (K/uL)   RBC 3.59 (*) 3.87 - 5.11 (MIL/uL)   Hemoglobin 10.1 (*) 12.0 - 15.0 (g/dL)   HCT 91.4 (*) 78.2 - 46.0 (%)   MCV 90.8  78.0 - 100.0 (fL)   MCH 28.1  26.0 - 34.0 (pg)   MCHC 31.0  30.0 - 36.0 (g/dL)   RDW 95.6  21.3 - 08.6 (%)   Platelets 342  150 - 400 (K/uL)  DIFFERENTIAL      Component Value Range   Neutrophils Relative 87 (*) 43 - 77 (%)   Neutro Abs 14.9 (*) 1.7 - 7.7 (K/uL)   Lymphocytes Relative 5 (*) 12 - 46 (%)   Lymphs Abs 0.8  0.7 - 4.0 (K/uL)   Monocytes Relative 7  3 - 12 (%)   Monocytes Absolute 1.1 (*) 0.1 - 1.0 (K/uL)   Eosinophils Relative 2  0 - 5 (%)   Eosinophils Absolute 0.3  0.0 - 0.7 (K/uL)   Basophils Relative 0  0 - 1 (%)   Basophils Absolute 0.0  0.0 - 0.1 (K/uL)  URINE MICROSCOPIC-ADD ON      Component Value  Range   Squamous Epithelial / LPF MANY (*) RARE    WBC, UA 0-2  <3 (WBC/hpf)   RBC / HPF 0-2  <3 (RBC/hpf)   Bacteria, UA FEW (*) RARE  Dg Chest Portable 1 View  02/26/2011  *RADIOLOGY REPORT*  Clinical Data: Fever.  Short of breath.  Pacemaker.  PORTABLE CHEST - 1 VIEW  Comparison: 12/01/2010  Findings: The heart is enlarged.  Dual lead pacemaker remains in place.  There is venous hypertension with mild interstitial edema. No effusions.  No significant bony finding.  IMPRESSION: Interstitial edema  Original Report Authenticated By: Thomasenia Sales, M.D.    ED COURSE / COORDINATION OF CARE: 22:08 Pt recheck. Pt reports no improvement. Pt reexamination- no new findings.  22:16 Consult with internal medicine Dr Orvan Falconer.  MDM fever, wbc elevated.    MEDICATIONS GIVEN IN THE E.D. Medications - No data to display The chart was scribed for me under my direct supervision.  I personally performed the history, physical, and medical decision making and all procedures in the evaluation of this patient.[name], MD 02/26/11 2226

## 2011-02-27 ENCOUNTER — Encounter (HOSPITAL_COMMUNITY): Payer: Self-pay

## 2011-02-27 DIAGNOSIS — L02419 Cutaneous abscess of limb, unspecified: Secondary | ICD-10-CM | POA: Diagnosis present

## 2011-02-27 DIAGNOSIS — M1710 Unilateral primary osteoarthritis, unspecified knee: Secondary | ICD-10-CM | POA: Diagnosis present

## 2011-02-27 DIAGNOSIS — L304 Erythema intertrigo: Secondary | ICD-10-CM | POA: Diagnosis present

## 2011-02-27 DIAGNOSIS — L03119 Cellulitis of unspecified part of limb: Secondary | ICD-10-CM | POA: Diagnosis present

## 2011-02-27 LAB — COMPREHENSIVE METABOLIC PANEL
ALT: 17
AST: 25
AST: 27
Albumin: 3.1 — ABNORMAL LOW
Albumin: 3.9
Alkaline Phosphatase: 81
BUN: 7
CO2: 30
Chloride: 100
Creatinine, Ser: 0.61
GFR calc Af Amer: >60
GFR calc Af Amer: >60
GFR calc non Af Amer: >60
Potassium: 3.7
Sodium: 138
Sodium: 139
Total Bilirubin: 1.1
Total Protein: 8

## 2011-02-27 LAB — CBC
HCT: 31.3 % — ABNORMAL LOW (ref 36.0–46.0)
HCT: 32 — ABNORMAL LOW
HCT: 36.5
HCT: 36.6
HCT: 36.7
HCT: 38.9
Hemoglobin: 10.6 — ABNORMAL LOW
Hemoglobin: 11.9 — ABNORMAL LOW
Hemoglobin: 12.4
Hemoglobin: 12.9
MCV: 83
MCV: 84.2
MCV: 84.3
MCV: 84.5
Platelets: 315 10*3/uL (ref 150–400)
Platelets: 277
Platelets: 299
Platelets: 320
RBC: 4.39
RBC: 4.61
RDW: 15.6 % — ABNORMAL HIGH (ref 11.5–15.5)
RDW: 17 — ABNORMAL HIGH
RDW: 17.2 — ABNORMAL HIGH
WBC: 16.8 10*3/uL — ABNORMAL HIGH (ref 4.0–10.5)
WBC: 7.7
WBC: 8.1
WBC: 8.5
WBC: 8.8

## 2011-02-27 LAB — HEPATIC FUNCTION PANEL
ALT: 19 U/L (ref 0–35)
Albumin: 3.6 g/dL (ref 3.5–5.2)
Alkaline Phosphatase: 96 U/L (ref 39–117)
Total Protein: 8 g/dL (ref 6.0–8.3)

## 2011-02-27 LAB — BASIC METABOLIC PANEL
BUN: 6
BUN: 7
BUN: 7
BUN: 8
BUN: 8
CO2: 30
CO2: 34 — ABNORMAL HIGH
Calcium: 8.6
Chloride: 90 mEq/L — ABNORMAL LOW (ref 96–112)
Chloride: 100
Chloride: 100
Chloride: 100
Chloride: 97
Chloride: 99
Creatinine, Ser: 1.22 mg/dL — ABNORMAL HIGH (ref 0.50–1.10)
GFR calc Af Amer: 57 mL/min — ABNORMAL LOW (ref >90)
GFR calc Af Amer: >60
GFR calc non Af Amer: 49 mL/min — ABNORMAL LOW (ref >90)
GFR calc non Af Amer: >60
GFR calc non Af Amer: >60
GFR calc non Af Amer: >60
GFR calc non Af Amer: >60
GFR calc non Af Amer: >60
Glucose, Bld: 144 — ABNORMAL HIGH
Glucose, Bld: 144 — ABNORMAL HIGH
Glucose, Bld: 151 — ABNORMAL HIGH
Potassium: 4.3 mEq/L (ref 3.5–5.1)
Potassium: 3.1 — ABNORMAL LOW
Potassium: 3.6
Potassium: 3.7
Potassium: 4
Potassium: 4.1
Potassium: 4.7
Sodium: 139
Sodium: 139
Sodium: 140
Sodium: 140
Sodium: 141
Sodium: 142

## 2011-02-27 LAB — MAGNESIUM
Magnesium: 1.8 mg/dL (ref 1.5–2.5)
Magnesium: 1.6

## 2011-02-27 LAB — I-STAT 8, (EC8 V) (CONVERTED LAB)
BUN: 9
Bicarbonate: 31 — ABNORMAL HIGH
Glucose, Bld: 144 — ABNORMAL HIGH
Sodium: 139
TCO2: 33
pCO2, Ven: 52 — ABNORMAL HIGH
pH, Ven: 7.384 — ABNORMAL HIGH

## 2011-02-27 LAB — GLUCOSE, CAPILLARY
Glucose-Capillary: 187 mg/dL — ABNORMAL HIGH (ref 70–99)
Glucose-Capillary: 197 mg/dL — ABNORMAL HIGH (ref 70–99)

## 2011-02-27 LAB — TSH
TSH: 2.098
TSH: 2.2
TSH: 1.996 u[IU]/mL (ref 0.350–4.500)

## 2011-02-27 LAB — URINALYSIS, ROUTINE W REFLEX MICROSCOPIC
Bilirubin Urine: NEGATIVE
Hgb urine dipstick: NEGATIVE
Ketones, ur: NEGATIVE
Nitrite: NEGATIVE
Specific Gravity, Urine: 1.011
Urobilinogen, UA: 0.2

## 2011-02-27 LAB — CK TOTAL AND CKMB (NOT AT ARMC)
CK, MB: 0.9
CK, MB: 1

## 2011-02-27 LAB — HEMOGLOBIN A1C
Hgb A1c MFr Bld: 8.4 % — ABNORMAL HIGH (ref <5.7)
Mean Plasma Glucose: 194 mg/dL — ABNORMAL HIGH (ref <117)

## 2011-02-27 LAB — DIFFERENTIAL
Basophils Absolute: 0
Eosinophils Absolute: 0.2
Eosinophils Relative: 3
Lymphocytes Relative: 20
Neutrophils Relative %: 69

## 2011-02-27 LAB — POCT I-STAT CREATININE: Creatinine, Ser: 0.8

## 2011-02-27 LAB — TROPONIN I
Troponin I: 0.03
Troponin I: 0.03

## 2011-02-27 LAB — URINE MICROSCOPIC-ADD ON

## 2011-02-27 LAB — PROTIME-INR: Prothrombin Time: 11.7

## 2011-02-27 MED ORDER — FLEET ENEMA 7-19 GM/118ML RE ENEM: 1.0000 | ENEMA | RECTAL | Status: DC | PRN

## 2011-02-27 MED ORDER — SODIUM CHLORIDE 0.9 % IV SOLN
600.0000 mg | Freq: Two times a day (BID) | INTRAVENOUS | Status: DC
  Administered 2011-02-27 – 2011-03-02 (×7): 600 mg via INTRAVENOUS
  Filled 2011-02-27 (×14): qty 600

## 2011-02-27 MED ORDER — FUROSEMIDE 40 MG PO TABS
40.0000 mg | ORAL_TABLET | Freq: Two times a day (BID) | ORAL | Status: DC
  Administered 2011-02-27 – 2011-03-02 (×7): 40 mg via ORAL
  Filled 2011-02-27 (×8): qty 1

## 2011-02-27 MED ORDER — POLYETHYLENE GLYCOL 3350 17 G PO PACK: 17.0000 g | PACK | Freq: Every day | ORAL | Status: DC | PRN

## 2011-02-27 MED ORDER — SPIRONOLACTONE 25 MG PO TABS
25.0000 mg | ORAL_TABLET | Freq: Every day | ORAL | Status: DC
  Administered 2011-02-27 – 2011-03-02 (×4): 25 mg via ORAL
  Filled 2011-02-27 (×4): qty 1

## 2011-02-27 MED ORDER — ASPIRIN EC 81 MG PO TBEC
81.0000 mg | DELAYED_RELEASE_TABLET | Freq: Every day | ORAL | Status: DC
Start: 2011-02-27 — End: 2011-03-02
  Administered 2011-02-27 – 2011-03-02 (×4): 81 mg via ORAL
  Filled 2011-02-27 (×4): qty 1

## 2011-02-27 MED ORDER — HYDROCODONE-ACETAMINOPHEN 5-325 MG PO TABS
1.0000 | ORAL_TABLET | Freq: Four times a day (QID) | ORAL | Status: DC | PRN
  Administered 2011-02-27 – 2011-02-28 (×3): 1 via ORAL
  Filled 2011-02-27 (×3): qty 1

## 2011-02-27 MED ORDER — INSULIN ASPART 100 UNIT/ML ~~LOC~~ SOLN
0.0000 [IU] | Freq: Every day | SUBCUTANEOUS | Status: DC
  Administered 2011-02-28: 0 [IU] via SUBCUTANEOUS

## 2011-02-27 MED ORDER — ENOXAPARIN SODIUM 40 MG/0.4ML ~~LOC~~ SOLN
40.0000 mg | SUBCUTANEOUS | Status: DC
  Administered 2011-02-27: 40 mg via SUBCUTANEOUS
  Filled 2011-02-27: qty 0.4

## 2011-02-27 MED ORDER — GLIPIZIDE 5 MG PO TABS
5.0000 mg | ORAL_TABLET | Freq: Two times a day (BID) | ORAL | Status: DC
  Administered 2011-02-27 – 2011-03-02 (×7): 5 mg via ORAL
  Filled 2011-02-27 (×7): qty 1

## 2011-02-27 MED ORDER — KETOROLAC TROMETHAMINE 30 MG/ML IJ SOLN
30.0000 mg | Freq: Once | INTRAMUSCULAR | Status: AC
  Administered 2011-02-27: 30 mg via INTRAVENOUS
  Filled 2011-02-27: qty 1

## 2011-02-27 MED ORDER — BISACODYL 10 MG RE SUPP: 10.0000 mg | RECTAL | Status: DC | PRN

## 2011-02-27 MED ORDER — HYDRALAZINE HCL 25 MG PO TABS
100.0000 mg | ORAL_TABLET | Freq: Three times a day (TID) | ORAL | Status: DC
  Administered 2011-02-27 – 2011-03-02 (×9): 100 mg via ORAL
  Filled 2011-02-27 (×2): qty 4
  Filled 2011-02-27: qty 3
  Filled 2011-02-27: qty 1
  Filled 2011-02-27 (×7): qty 4

## 2011-02-27 MED ORDER — INSULIN ASPART 100 UNIT/ML ~~LOC~~ SOLN
0.0000 [IU] | Freq: Three times a day (TID) | SUBCUTANEOUS | Status: DC
  Administered 2011-02-27: 1 [IU] via SUBCUTANEOUS
  Administered 2011-02-27 – 2011-02-28 (×4): 2 [IU] via SUBCUTANEOUS
  Administered 2011-02-28 – 2011-03-01 (×2): 1 [IU] via SUBCUTANEOUS
  Administered 2011-03-01 – 2011-03-02 (×3): 2 [IU] via SUBCUTANEOUS
  Administered 2011-03-02: 1 [IU] via SUBCUTANEOUS
  Filled 2011-02-27: qty 3

## 2011-02-27 MED ORDER — SODIUM CHLORIDE 0.9 % IJ SOLN
3.0000 mL | Freq: Two times a day (BID) | INTRAMUSCULAR | Status: DC
Start: 2011-02-27 — End: 2011-03-02
  Administered 2011-02-27 – 2011-03-02 (×7): 3 mL via INTRAVENOUS
  Filled 2011-02-27 (×8): qty 3

## 2011-02-27 MED ORDER — ENOXAPARIN SODIUM 60 MG/0.6ML ~~LOC~~ SOLN
90.0000 mg | SUBCUTANEOUS | Status: DC
  Administered 2011-02-28 – 2011-03-01 (×3): 90 mg via SUBCUTANEOUS
  Filled 2011-02-27: qty 0.3
  Filled 2011-02-27: qty 0.9
  Filled 2011-02-27 (×3): qty 0.3

## 2011-02-27 MED ORDER — OXYCODONE HCL 5 MG PO TABS
5.0000 mg | ORAL_TABLET | ORAL | Status: DC | PRN
  Administered 2011-02-27 (×2): 5 mg via ORAL
  Filled 2011-02-27 (×2): qty 1

## 2011-02-27 MED ORDER — TRAZODONE HCL 50 MG PO TABS: 25.0000 mg | ORAL_TABLET | Freq: Every evening | ORAL | Status: DC | PRN

## 2011-02-27 MED ORDER — ONDANSETRON HCL 4 MG PO TABS: 4.0000 mg | ORAL_TABLET | Freq: Four times a day (QID) | ORAL | Status: DC | PRN

## 2011-02-27 MED ORDER — HYDROMORPHONE HCL 1 MG/ML IJ SOLN
0.5000 mg | INTRAMUSCULAR | Status: DC | PRN
  Administered 2011-02-28 – 2011-03-02 (×7): 0.5 mg via INTRAVENOUS
  Filled 2011-02-27 (×8): qty 1

## 2011-02-27 MED ORDER — LABETALOL HCL 200 MG PO TABS
300.0000 mg | ORAL_TABLET | Freq: Two times a day (BID) | ORAL | Status: DC
  Administered 2011-02-27 – 2011-03-02 (×6): 300 mg via ORAL
  Filled 2011-02-27 (×7): qty 2

## 2011-02-27 MED ORDER — ONDANSETRON HCL 4 MG/2ML IJ SOLN: 4.0000 mg | Freq: Four times a day (QID) | INTRAMUSCULAR | Status: DC | PRN

## 2011-02-27 MED ORDER — ACETAMINOPHEN 325 MG PO TABS
650.0000 mg | ORAL_TABLET | Freq: Four times a day (QID) | ORAL | Status: DC | PRN
  Administered 2011-02-27 (×2): 650 mg via ORAL
  Filled 2011-02-27 (×2): qty 2

## 2011-02-27 MED ORDER — NYSTATIN 100000 UNIT/GM EX POWD
Freq: Three times a day (TID) | CUTANEOUS | Status: DC
  Administered 2011-02-27 – 2011-03-02 (×10): via TOPICAL
  Filled 2011-02-27: qty 15

## 2011-02-27 MED ORDER — ACETAMINOPHEN 650 MG RE SUPP: 650.0000 mg | Freq: Four times a day (QID) | RECTAL | Status: DC | PRN

## 2011-02-27 MED ORDER — CLONIDINE HCL 0.2 MG PO TABS
0.2000 mg | ORAL_TABLET | Freq: Two times a day (BID) | ORAL | Status: DC
  Administered 2011-02-27 – 2011-03-02 (×7): 0.2 mg via ORAL
  Filled 2011-02-27 (×7): qty 1

## 2011-02-27 MED ORDER — INFLUENZA VIRUS VACC SPLIT PF IM SUSP
0.5000 mL | Freq: Once | INTRAMUSCULAR | Status: AC
  Administered 2011-02-28: 0.5 mL via INTRAMUSCULAR
  Filled 2011-02-27: qty 0.5

## 2011-02-27 NOTE — Progress Notes (Signed)
Chart reviewed.  Subjective: Complains of bilateral knee pain from arthritis. Complains of thigh pain. Complains of frontal headache.  Objective: Vital signs in last 24 hours: Filed Vitals:   02/26/11 2315 02/27/11 0005 02/27/11 0638 02/27/11 1003  BP: 119/60 92/58 100/62 144/73  Pulse: 74 85 75 80  Temp:  99.4 F (37.4 C) 98.1 F (36.7 C) 97.5 F (36.4 C)  TempSrc:  Oral Oral Oral  Resp: 20 20 18 18   Height:  5' (1.524 m)    Weight:  183.6 kg (404 lb 12.2 oz) 184.7 kg (407 lb 3 oz)   SpO2: 99% 96% 96% 95%   Weight change:   Intake/Output Summary (Last 24 hours) at 02/27/11 1330 Last data filed at 02/27/11 1300  Gross per 24 hour  Intake    173 ml  Output      0 ml  Net    173 ml   General: Morbidly obese. HEENT: No sinus tenderness Lungs clear to auscultation bilaterally without wheeze rhonchi or rales Cardiovascular regular rate rhythm without murmurs gallops rubs Abdomen obese Extremities: Pulses intact. 1+ edema Skin: Erythema under pannus. Right medial thigh with peau d'orange, erythema, warmth, tenderness greater than the left medial thigh. Lab Results: Basic Metabolic Panel:  Lab 02/27/11 1610 02/27/11 0030 02/26/11 2017  NA 137 -- 138  K 4.3 -- 4.3  CL 90* -- 90*  CO2 38* -- 39*  GLUCOSE 200* -- 111*  BUN 35* -- 30*  CREATININE 1.22* 0.95 --  CALCIUM 9.6 -- 10.0  MG -- 1.8 --  PHOS -- 3.0 --   Liver Function Tests:  Lab 02/27/11 0030  AST 25  ALT 19  ALKPHOS 96  BILITOT 0.2*  PROT 8.0  ALBUMIN 3.6   No results found for this basename: LIPASE:2,AMYLASE:2 in the last 168 hours No results found for this basename: AMMONIA:2 in the last 168 hours CBC:  Lab 02/27/11 0536 02/26/11 2017  WBC 16.8* 17.2*  NEUTROABS -- 14.9*  HGB 9.5* 10.1*  HCT 31.3* 32.6*  MCV 90.5 90.8  PLT 315 342   Cardiac Enzymes: No results found for this basename: CKTOTAL:3,CKMB:3,CKMBINDEX:3,TROPONINI:3 in the last 168 hours BNP: No results found for this basename:  POCBNP:3 in the last 168 hours D-Dimer: No results found for this basename: DDIMER:2 in the last 168 hours CBG:  Lab 02/27/11 1131 02/27/11 0754  GLUCAP 197* 187*    Micro Results: Recent Results (from the past 240 hour(s))  CULTURE, BLOOD (ROUTINE X 2)     Status: Normal (Preliminary result)   Collection Time   02/26/11 11:20 PM      Component Value Range Status Comment   Specimen Description BLOOD RIGHT FOREARM   Final    Special Requests BOTTLES DRAWN AEROBIC AND ANAEROBIC 6CC   Final    Culture NO GROWTH 1 DAY   Final    Report Status PENDING   Incomplete   CULTURE, BLOOD (ROUTINE X 2)     Status: Normal (Preliminary result)   Collection Time   02/26/11 11:21 PM      Component Value Range Status Comment   Specimen Description BLOOD RIGHT HAND   Final    Special Requests BOTTLES DRAWN AEROBIC AND ANAEROBIC 5CC   Final    Culture NO GROWTH 1 DAY   Final    Report Status PENDING   Incomplete    Studies/Results: Dg Chest Portable 1 View  02/26/2011  *RADIOLOGY REPORT*  Clinical Data: Fever.  Short of breath.  Pacemaker.  PORTABLE CHEST - 1 VIEW  Comparison: 12/01/2010  Findings: The heart is enlarged.  Dual lead pacemaker remains in place.  There is venous hypertension with mild interstitial edema. No effusions.  No significant bony finding.  IMPRESSION: Interstitial edema  Original Report Authenticated By: Thomasenia Sales, M.D.   Scheduled Meds:   . aspirin EC  81 mg Oral Daily  . ceftaroline (TEFLARO) 600 mg IVPB  600 mg Intravenous Q12H  . cloNIDine  0.2 mg Oral BID  . enoxaparin  40 mg Subcutaneous Q24H  . flecainide  100 mg Oral Q12H  . furosemide  40 mg Oral BID  . glipiZIDE  5 mg Oral BID AC  . hydrALAZINE  100 mg Oral TID  . influenza  inactive virus vaccine  0.5 mL Intramuscular Once  . insulin aspart  0-5 Units Subcutaneous QHS  . insulin aspart  0-9 Units Subcutaneous TID WC  . ketorolac  30 mg Intravenous Once  . labetalol  300 mg Oral BID  . nystatin   Topical  TID  . sodium chloride  3 mL Intravenous Q12H  . spironolactone  25 mg Oral Daily   Continuous Infusions:  PRN Meds:.acetaminophen, bisacodyl, HYDROmorphone, ondansetron (ZOFRAN) IV, ondansetron, oxyCODONE, polyethylene glycol, sodium phosphate, traZODone, DISCONTD: acetaminophen Assessment/Plan: Principal Problem:  *Fever and chills: Due to cellulitis of the right greater than left leg. She has a history of diabetes and has had multiple previous hospitalizations, so will cover with broad-spectrum antibiotics. ceftaroline. Changed to inpatient status Active Problems:  Cellulitis and abscess of leg  Intertrigo  OA (osteoarthritis) of knee: We'll give a single dose of Toradol then continued Dilaudid or Vicodin as needed.  DIABETES MELLITUS, TYPE II  HYPERLIPIDEMIA  OBESITY, MORBID  ANEMIA  HYPERTENSION  GERD  LOW BACK PAIN  Chronic diastolic heart failure, compensated   LOS: 1 day   [name] L 02/27/2011, 1:30 PM

## 2011-02-27 NOTE — Consult Note (Signed)
CSW received referral due to pt's lack of insurance.  CSW notified Kathie Rhodes, financial counselor to assist and will sign off unless further needs arise.  Karn Cassis

## 2011-02-28 ENCOUNTER — Encounter (HOSPITAL_COMMUNITY): Payer: Self-pay | Admitting: Internal Medicine

## 2011-02-28 DIAGNOSIS — N179 Acute kidney failure, unspecified: Secondary | ICD-10-CM | POA: Diagnosis present

## 2011-02-28 LAB — CBC
HCT: 29.7 % — ABNORMAL LOW (ref 36.0–46.0)
Hemoglobin: 9.2 g/dL — ABNORMAL LOW (ref 12.0–15.0)
MCH: 28.1 pg (ref 26.0–34.0)
MCHC: 31 g/dL (ref 30.0–36.0)
RDW: 15.6 % — ABNORMAL HIGH (ref 11.5–15.5)

## 2011-02-28 LAB — BASIC METABOLIC PANEL
BUN: 30 mg/dL — ABNORMAL HIGH (ref 6–23)
Calcium: 9.2 mg/dL (ref 8.4–10.5)
Creatinine, Ser: 1.17 mg/dL — ABNORMAL HIGH (ref 0.50–1.10)
GFR calc non Af Amer: 51 mL/min — ABNORMAL LOW (ref >90)
Glucose, Bld: 213 mg/dL — ABNORMAL HIGH (ref 70–99)

## 2011-02-28 LAB — GLUCOSE, CAPILLARY: Glucose-Capillary: 141 mg/dL — ABNORMAL HIGH (ref 70–99)

## 2011-02-28 MED ORDER — SODIUM CHLORIDE 0.9 % IJ SOLN
INTRAMUSCULAR | Status: AC
  Administered 2011-02-28: 10 mL
  Filled 2011-02-28: qty 10

## 2011-02-28 MED ORDER — INSULIN GLARGINE 100 UNIT/ML ~~LOC~~ SOLN
10.0000 [IU] | Freq: Two times a day (BID) | SUBCUTANEOUS | Status: DC
  Administered 2011-02-28 – 2011-03-02 (×5): 10 [IU] via SUBCUTANEOUS
  Filled 2011-02-28: qty 3

## 2011-02-28 MED ORDER — FLUCONAZOLE 100 MG PO TABS
200.0000 mg | ORAL_TABLET | Freq: Every day | ORAL | Status: DC
  Administered 2011-02-28 – 2011-03-02 (×3): 200 mg via ORAL
  Filled 2011-02-28 (×3): qty 2

## 2011-02-28 NOTE — Progress Notes (Signed)
INITIAL ADULT NUTRITION ASSESSMENT Date: 02/28/2011   Time: 3:22 PM Reason for Assessment: Low Braden  ASSESSMENT: Female 55 y.o.  Dx: Fever and chills   Past Medical History  Diagnosis Date  . Morbid obesity   . Diabetes mellitus   . Hypertension   . CHF (congestive heart failure)     Diastolic  . Pacemaker   . COPD (chronic obstructive pulmonary disease)   . Oxygen dependent   . Pickwickian syndrome   . Chronic back pain   . Coronary artery disease   . Shortness of breath   . Obesity hypoventilation syndrome     Scheduled Meds:   . aspirin EC  81 mg Oral Daily  . ceftaroline (TEFLARO) 600 mg IVPB  600 mg Intravenous Q12H  . cloNIDine  0.2 mg Oral BID  . enoxaparin  90 mg Subcutaneous Q24H  . flecainide  100 mg Oral Q12H  . fluconazole  200 mg Oral Daily  . furosemide  40 mg Oral BID  . glipiZIDE  5 mg Oral BID AC  . hydrALAZINE  100 mg Oral TID  . influenza  inactive virus vaccine  0.5 mL Intramuscular Once  . insulin aspart  0-5 Units Subcutaneous QHS  . insulin aspart  0-9 Units Subcutaneous TID WC  . insulin glargine  10 Units Subcutaneous BID  . ketorolac  30 mg Intravenous Once  . labetalol  300 mg Oral BID  . nystatin   Topical TID  . sodium chloride  3 mL Intravenous Q12H  . sodium chloride      . spironolactone  25 mg Oral Daily   Continuous Infusions:  PRN Meds:.acetaminophen, bisacodyl, HYDROcodone-acetaminophen, HYDROmorphone, ondansetron (ZOFRAN) IV, ondansetron, polyethylene glycol, sodium phosphate, traZODone   Ht: 5' (152.4 cm)  Wt: 407 lb 3 oz (184.7 kg)  Ideal Wt: 45.5 kg 50 kg % Ideal Wt: 407%  Usual Wt: unknown % Usual Wt: unknown  Body mass index is 79.52 kg/(m^2).  Food/Nutrition Related Hx: Pt morbidly obese. Low Braden score=11 high risk for skin breakdown. Elevated blood glucose with HgB A1C of 8.4%.  BMET    Component Value Date/Time   NA 137 02/28/2011 0539   K 3.7 02/28/2011 0539   CL 90* 02/28/2011 0539   CO2 38*  02/28/2011 0539   GLUCOSE 213* 02/28/2011 0539   BUN 30* 02/28/2011 0539   CREATININE 1.17* 02/28/2011 0539   CALCIUM 9.2 02/28/2011 0539   GFRNONAA 51* 02/28/2011 0539   GFRAA 60* 02/28/2011 0539    CBC    Component Value Date/Time   WBC 10.3 02/28/2011 0539   RBC 3.27* 02/28/2011 0539   HGB 9.2* 02/28/2011 0539   HCT 29.7* 02/28/2011 0539   PLT 298 02/28/2011 0539   MCV 90.8 02/28/2011 0539   MCH 28.1 02/28/2011 0539   MCHC 31.0 02/28/2011 0539   RDW 15.6* 02/28/2011 0539   LYMPHSABS 0.8 02/26/2011 2017   MONOABS 1.1* 02/26/2011 2017   EOSABS 0.3 02/26/2011 2017   BASOSABS 0.0 02/26/2011 2017   CBG (last 3)   Basename 02/28/11 1139 02/28/11 0744 02/27/11 2143  GLUCAP 141* 195* 183*    Intake/Output Summary (Last 24 hours) at 02/28/11 1524 Last data filed at 02/27/11 2000  Gross per 24 hour  Intake    240 ml  Output    300 ml  Net    -60 ml     Diet Order: Carb Control CHO Modified Medium  Supplements/Tube Feeding:none at this time  IVF:  Estimated Nutritional Needs:   Kcal:1363-1589 (for gradual wt loss) Protein:70-80 grams  Fluid:1.8-.9 L/d  NUTRITION DIAGNOSIS: -Altered nutrition-related laboratory values (specify) (NI-2.2).  Status: Ongoing  RELATED TO:  -DM management  AS EVIDENCE BY:  -Hyperglycemia, HgB A1C 8.4%  MONITORING/EVALUATION(Goals): -Pt will improve glucose control. -She will maintain skin integrity  EDUCATION NEEDS: -Education needs addressed  INTERVENTION: -Provide DM diet review -Rec pt F/U with outpatient RD after d/c for nutrition counseling.  Dietitian [phone]  DOCUMENTATION CODES Per approved criteria  -Morbid Obesity    Francene Boyers 02/28/2011, 3:22 PM

## 2011-02-28 NOTE — Progress Notes (Addendum)
Chart reviewed.  Subjective: Complains of bilateral knee pain from arthritis. Complains of thigh pain. She has not been asking for pain medications consistently.  Objective: Vital signs in last 24 hours: Filed Vitals:   02/27/11 1500 02/27/11 1511 02/27/11 2200 02/28/11 0613  BP: 129/74  123/72 147/66  Pulse: 74  69 76  Temp: 98.6 F (37 C)  98.6 F (37 C) 98.7 F (37.1 C)  TempSrc: Oral   Oral  Resp: 20  22 20   Height:      Weight:      SpO2: 98% 100% 98% 97%   Weight change:   Intake/Output Summary (Last 24 hours) at 02/28/11 1046 Last data filed at 02/27/11 2000  Gross per 24 hour  Intake    290 ml  Output    300 ml  Net    -10 ml   General: Morbidly obese, no acute distress. Lungs clear to auscultation bilaterally without wheeze rhonchi or rales Cardiovascular regular rate rhythm without murmurs gallops rubs Abdomen obese, positive bowel sounds, nontender. Extremities: Pulses intact. 1+ edema Skin: Erythema under pannus. Right medial thigh with peau d'orange, erythema, warmth, tenderness greater than the left medial thigh. Erythema under breasts and around perineum with scaliness. Lab Results: Basic Metabolic Panel:  Lab 02/28/11 1610 02/27/11 0536 02/27/11 0030  NA 137 137 --  K 3.7 4.3 --  CL 90* 90* --  CO2 38* 38* --  GLUCOSE 213* 200* --  BUN 30* 35* --  CREATININE 1.17* 1.22* --  CALCIUM 9.2 9.6 --  MG -- -- 1.8  PHOS -- -- 3.0   Liver Function Tests:  Lab 02/27/11 0030  AST 25  ALT 19  ALKPHOS 96  BILITOT 0.2*  PROT 8.0  ALBUMIN 3.6   No results found for this basename: LIPASE:2,AMYLASE:2 in the last 168 hours No results found for this basename: AMMONIA:2 in the last 168 hours CBC:  Lab 02/28/11 0539 02/27/11 0536 02/26/11 2017  WBC 10.3 16.8* --  NEUTROABS -- -- 14.9*  HGB 9.2* 9.5* --  HCT 29.7* 31.3* --  MCV 90.8 90.5 --  PLT 298 315 --   Cardiac Enzymes: No results found for this basename:  CKTOTAL:3,CKMB:3,CKMBINDEX:3,TROPONINI:3 in the last 168 hours BNP: No results found for this basename: POCBNP:3 in the last 168 hours D-Dimer: No results found for this basename: DDIMER:2 in the last 168 hours CBG:  Lab 02/28/11 0744 02/27/11 2143 02/27/11 1709 02/27/11 1131 02/27/11 0754  GLUCAP 195* 183* 138* 197* 187*    Micro Results: Recent Results (from the past 240 hour(s))  CULTURE, BLOOD (ROUTINE X 2)     Status: Normal (Preliminary result)   Collection Time   02/26/11 11:20 PM      Component Value Range Status Comment   Specimen Description BLOOD RIGHT FOREARM   Final    Special Requests BOTTLES DRAWN AEROBIC AND ANAEROBIC 6CC   Final    Culture NO GROWTH 2 DAYS   Final    Report Status PENDING   Incomplete   CULTURE, BLOOD (ROUTINE X 2)     Status: Normal (Preliminary result)   Collection Time   02/26/11 11:21 PM      Component Value Range Status Comment   Specimen Description BLOOD RIGHT HAND   Final    Special Requests BOTTLES DRAWN AEROBIC AND ANAEROBIC 5CC   Final    Culture NO GROWTH 2 DAYS   Final    Report Status PENDING   Incomplete    Studies/Results:  Dg Chest Portable 1 View  02/26/2011  *RADIOLOGY REPORT*  Clinical Data: Fever.  Short of breath.  Pacemaker.  PORTABLE CHEST - 1 VIEW  Comparison: 12/01/2010  Findings: The heart is enlarged.  Dual lead pacemaker remains in place.  There is venous hypertension with mild interstitial edema. No effusions.  No significant bony finding.  IMPRESSION: Interstitial edema  Original Report Authenticated By: Thomasenia Sales, M.D.   Scheduled Meds:    . aspirin EC  81 mg Oral Daily  . ceftaroline (TEFLARO) 600 mg IVPB  600 mg Intravenous Q12H  . cloNIDine  0.2 mg Oral BID  . enoxaparin  90 mg Subcutaneous Q24H  . flecainide  100 mg Oral Q12H  . furosemide  40 mg Oral BID  . glipiZIDE  5 mg Oral BID AC  . hydrALAZINE  100 mg Oral TID  . influenza  inactive virus vaccine  0.5 mL Intramuscular Once  . insulin aspart   0-5 Units Subcutaneous QHS  . insulin aspart  0-9 Units Subcutaneous TID WC  . ketorolac  30 mg Intravenous Once  . labetalol  300 mg Oral BID  . nystatin   Topical TID  . sodium chloride  3 mL Intravenous Q12H  . spironolactone  25 mg Oral Daily  . DISCONTD: enoxaparin  40 mg Subcutaneous Q24H   Continuous Infusions:  PRN Meds:.acetaminophen, bisacodyl, HYDROcodone-acetaminophen, HYDROmorphone, ondansetron (ZOFRAN) IV, ondansetron, polyethylene glycol, sodium phosphate, traZODone, DISCONTD: acetaminophen, DISCONTD: oxyCODONE Assessment/Plan: Principal Problem: Cellulitis of the right greater than left leg. She has a history of diabetes and has had multiple previous hospitalizations, so she is being covered with broad-spectrum antibiotic, ceftaroline.   Intertrigo: On nystatin topical powder. We'll add Diflucan.   OA (osteoarthritis) of knee: Status post 1 dose of Toradol. She is being treated with the Dialudid and Vicodin as needed.   DIABETES MELLITUS, TYPE II: She is being treated with glipizide and sliding scale NovoLog. Her glucose is uncontrolled. The goal and her should be to try to keep her capillary blood glucose under 200. Her hemoglobin A1c is 8.4. We'll add Lantus.  Chronic diastolic heart failure, currently compensated.  Chronic hypoxic respiratory failure/obesity hypoventilation syndrome, with compensatory alkalosis. Stable  Acute renal failure. This is mild under the circumstances. We will allow given her history of chronic heart failure. Of note, she had been taking 4-6 Motrin and occasional Aleve daily for arthritic pain.  Hypertension. Stable on multiple antihypertensive medications.  Anemia: Likely secondary to chronic disease    HYPERLIPIDEMIA  OBESITY, MORBID  ANEMIA  GERD  LOW BACK PAIN     LOS: 2 days   [name] 02/28/2011, 10:46 AM

## 2011-03-01 LAB — GLUCOSE, CAPILLARY
Glucose-Capillary: 159 mg/dL — ABNORMAL HIGH (ref 70–99)
Glucose-Capillary: 179 mg/dL — ABNORMAL HIGH (ref 70–99)

## 2011-03-01 LAB — CBC
HCT: 28.5 % — ABNORMAL LOW (ref 36.0–46.0)
Hemoglobin: 8.7 g/dL — ABNORMAL LOW (ref 12.0–15.0)
MCH: 27.7 pg (ref 26.0–34.0)
MCHC: 30.5 g/dL (ref 30.0–36.0)
MCV: 90.8 fL (ref 78.0–100.0)
MCV: 83.3
Platelets: 280
RDW: 15.8 % — ABNORMAL HIGH (ref 11.5–15.5)
WBC: 9.1

## 2011-03-01 LAB — DIFFERENTIAL
Basophils Relative: 0
Eosinophils Absolute: 0.3
Lymphs Abs: 1.8
Neutro Abs: 6.1
Neutrophils Relative %: 68

## 2011-03-01 LAB — BASIC METABOLIC PANEL
BUN: 26 mg/dL — ABNORMAL HIGH (ref 6–23)
BUN: 9
Calcium: 9.6 mg/dL (ref 8.4–10.5)
Calcium: 8.8
Chloride: 101
Creatinine, Ser: 1.14 mg/dL — ABNORMAL HIGH (ref 0.50–1.10)
Creatinine, Ser: 0.7
GFR calc Af Amer: 62 mL/min — ABNORMAL LOW (ref >90)
GFR calc Af Amer: >60
GFR calc non Af Amer: 53 mL/min — ABNORMAL LOW (ref >90)
Glucose, Bld: 140 mg/dL — ABNORMAL HIGH (ref 70–99)

## 2011-03-01 LAB — IRON AND TIBC
Saturation Ratios: 11 % — ABNORMAL LOW (ref 20–55)
TIBC: 265 ug/dL (ref 250–470)
UIBC: 236 ug/dL (ref 125–400)

## 2011-03-01 LAB — POCT CARDIAC MARKERS
CKMB, poc: 1.1
Myoglobin, poc: 63
Operator id: 265261

## 2011-03-01 LAB — VITAMIN B12: Vitamin B-12: 382 pg/mL (ref 211–911)

## 2011-03-01 NOTE — Progress Notes (Signed)
Subjective: No new complaints. The analgesics are helping with the knee pain.  Objective: Vital signs in last 24 hours: Filed Vitals:   02/28/11 2200 02/28/11 2318 03/01/11 0400 03/01/11 0700  BP: 143/76  138/72   Pulse: 63  68   Temp: 98.5 F (36.9 C)  98.6 F (37 C)   TempSrc: Oral  Oral   Resp: 18  20   Height:      Weight:      SpO2: 97% 95% 95% 98%   Weight change:  No intake or output data in the 24 hours ending 03/01/11 1021 General: Morbidly obese, no acute distress. Lungs clear to auscultation bilaterally without wheeze rhonchi or rales Cardiovascular regular rate rhythm without murmurs gallops rubs Abdomen obese, positive bowel sounds, nontender. Extremities: Pulses intact. Trace edema of lower extremities. Bilateral knee tenderness globally, but no warmth, effusion, or erythema. Skin: Less erythema and malodor under pannus. Right medial thigh with peau d'orange, and less erythema, warmth, tenderness. Less erythema under breasts and around perineum with scaliness. Lab Results: Basic Metabolic Panel:  Lab 03/01/11 7829 02/28/11 0539 02/27/11 0030  NA 137 137 --  K 3.7 3.7 --  CL 92* 90* --  CO2 37* 38* --  GLUCOSE 140* 213* --  BUN 26* 30* --  CREATININE 1.14* 1.17* --  CALCIUM 9.6 9.2 --  MG -- -- 1.8  PHOS -- -- 3.0   Liver Function Tests:  Lab 02/27/11 0030  AST 25  ALT 19  ALKPHOS 96  BILITOT 0.2*  PROT 8.0  ALBUMIN 3.6   No results found for this basename: LIPASE:2,AMYLASE:2 in the last 168 hours No results found for this basename: AMMONIA:2 in the last 168 hours CBC:  Lab 03/01/11 0628 02/28/11 0539 02/26/11 2017  WBC 8.0 10.3 --  NEUTROABS -- -- 14.9*  HGB 8.7* 9.2* --  HCT 28.5* 29.7* --  MCV 90.8 90.8 --  PLT 291 298 --   Cardiac Enzymes: No results found for this basename: CKTOTAL:3,CKMB:3,CKMBINDEX:3,TROPONINI:3 in the last 168 hours BNP: No results found for this basename: POCBNP:3 in the last 168 hours D-Dimer: No results  found for this basename: DDIMER:2 in the last 168 hours CBG:  Lab 03/01/11 0731 02/28/11 2139 02/28/11 1700 02/28/11 1139 02/28/11 0744 02/27/11 2143  GLUCAP 159* 193* 174* 141* 195* 183*    Micro Results: Recent Results (from the past 240 hour(s))  CULTURE, BLOOD (ROUTINE X 2)     Status: Normal (Preliminary result)   Collection Time   02/26/11 11:20 PM      Component Value Range Status Comment   Specimen Description BLOOD RIGHT FOREARM   Final    Special Requests BOTTLES DRAWN AEROBIC AND ANAEROBIC 6CC   Final    Culture NO GROWTH 2 DAYS   Final    Report Status PENDING   Incomplete   CULTURE, BLOOD (ROUTINE X 2)     Status: Normal (Preliminary result)   Collection Time   02/26/11 11:21 PM      Component Value Range Status Comment   Specimen Description BLOOD RIGHT HAND   Final    Special Requests BOTTLES DRAWN AEROBIC AND ANAEROBIC 5CC   Final    Culture NO GROWTH 2 DAYS   Final    Report Status PENDING   Incomplete    Studies/Results: No results found. Scheduled Meds:    . aspirin EC  81 mg Oral Daily  . ceftaroline (TEFLARO) 600 mg IVPB  600 mg Intravenous Q12H  .  cloNIDine  0.2 mg Oral BID  . enoxaparin  90 mg Subcutaneous Q24H  . flecainide  100 mg Oral Q12H  . fluconazole  200 mg Oral Daily  . furosemide  40 mg Oral BID  . glipiZIDE  5 mg Oral BID AC  . hydrALAZINE  100 mg Oral TID  . insulin aspart  0-5 Units Subcutaneous QHS  . insulin aspart  0-9 Units Subcutaneous TID WC  . insulin glargine  10 Units Subcutaneous BID  . labetalol  300 mg Oral BID  . nystatin   Topical TID  . sodium chloride  3 mL Intravenous Q12H  . sodium chloride      . spironolactone  25 mg Oral Daily   Continuous Infusions:  PRN Meds:.acetaminophen, bisacodyl, HYDROcodone-acetaminophen, HYDROmorphone, ondansetron (ZOFRAN) IV, ondansetron, polyethylene glycol, sodium phosphate, traZODone Assessment/Plan: Principal Problem: Cellulitis of the right greater than left leg. She has a  history of diabetes and has had multiple previous hospitalizations, so she is being covered with broad-spectrum antibiotic, ceftaroline. Clinically improving.   Intertrigo: On nystatin topical powder.Diflucan was added yesterday. Clinically improving.   OA (osteoarthritis) of knee: Status post 1 dose of Toradol. She is being treated with hydromorphone and Vicodin as needed.   DIABETES MELLITUS, TYPE II: She is being treated with glipizide and sliding scale NovoLog. Lantus was added yesterday. Her glucose is better. The goal and her should be to try to keep her capillary blood glucose under 200. Her hemoglobin A1c is 8.4.  Chronic diastolic heart failure, currently compensated.  Chronic hypoxic respiratory failure/obesity hypoventilation syndrome, with compensatory alkalosis. Stable  Acute renal failure. This is mild under the circumstances. We will allow given her history of chronic heart failure. Of note, she had been taking 4-6 Motrin and occasional Aleve daily for arthritic pain.  Hypertension. Stable on multiple antihypertensive medications.  Anemia: Likely secondary to chronic disease. Will check anemia studies.  Disposition: Home in the next 24-48 hours.    HYPERLIPIDEMIA  OBESITY, MORBID  ANEMIA  GERD  LOW BACK PAIN     LOS: 3 days   [name] 03/01/2011, 10:21 AM

## 2011-03-01 NOTE — Progress Notes (Signed)
Physical Therapy Evaluation Patient Details Name: [name] MRN: [id number] DOB: July 03, 1955 Today's Date: 03/01/2011  Attempted evaluation today.  Patient reports, "I know my body, and I am in too much pain today."  She reports that she has gout, arthritis and cellulitis pain.  She has been medicated by the RN.  The patient is bedridden at home. Her primary form of movement is rolling.  She states that she sometimes tries to sit up and that she has some leg bed exercises that she does regularly (she has not done them in the past 2 weeks because of progressive pain).  She states she doesn't go to the doctor because she can't get there.  She declined home therapy stating that she can continue her own exercise program as soon as the pain goes away.  She is agreeable for PT to check back on her while she is staying here.  She will need ambulance transport home.   [name], Claretta Fraise 03/01/2011, 3:17 PM

## 2011-03-02 LAB — CBC
HCT: 27.8 % — ABNORMAL LOW (ref 36.0–46.0)
Hemoglobin: 8.6 g/dL — ABNORMAL LOW (ref 12.0–15.0)
MCH: 28 pg (ref 26.0–34.0)
MCHC: 30.9 g/dL (ref 30.0–36.0)
MCV: 90.6 fL (ref 78.0–100.0)

## 2011-03-02 LAB — BASIC METABOLIC PANEL
BUN: 21 mg/dL (ref 6–23)
Chloride: 92 mEq/L — ABNORMAL LOW (ref 96–112)
Glucose, Bld: 131 mg/dL — ABNORMAL HIGH (ref 70–99)
Potassium: 3.8 mEq/L (ref 3.5–5.1)

## 2011-03-02 MED ORDER — NYSTATIN 100000 UNIT/GM EX POWD
CUTANEOUS | Status: AC
  Filled 2011-03-02: qty 15

## 2011-03-02 MED ORDER — FLUCONAZOLE 200 MG PO TABS: 200.0000 mg | ORAL_TABLET | Freq: Every day | ORAL | Status: AC

## 2011-03-02 MED ORDER — POLYETHYLENE GLYCOL 3350 17 G PO PACK: 17.0000 g | PACK | Freq: Every day | ORAL | Status: AC | PRN

## 2011-03-02 MED ORDER — CEPHALEXIN 500 MG PO CAPS: 500.0000 mg | ORAL_CAPSULE | Freq: Four times a day (QID) | ORAL | Status: AC

## 2011-03-02 MED ORDER — ASPIRIN 81 MG PO TBEC: 81.0000 mg | DELAYED_RELEASE_TABLET | Freq: Every day | ORAL | Status: AC

## 2011-03-02 MED ORDER — HYDROCODONE-ACETAMINOPHEN 5-325 MG PO TABS: 1.0000 | ORAL_TABLET | Freq: Four times a day (QID) | ORAL | Status: AC | PRN

## 2011-03-02 MED ORDER — NYSTATIN 100000 UNIT/GM EX POWD: 1.0000 g | Freq: Three times a day (TID) | CUTANEOUS | Status: DC | PRN

## 2011-03-02 NOTE — Progress Notes (Signed)
Physical Therapy Evaluation Patient Details Name: [name] MRN: [id number] DOB: 1956/04/16 Today's Date: 03/02/2011  Pt. Declined PT services again today.  She states her legs are still hurting her too much to move.  PT will check back one more time (on Monday 03/05/11- if she is still here) and if she refuses our services for a 3rd time we will sign off.  She is bedridden at baseline and will need an ambulance transport home.    Birdell Frasier, Claretta Fraise 03/02/2011, 8:39 AM

## 2011-03-02 NOTE — Discharge Summary (Signed)
Physician Discharge Summary  [name] MRN: [id number] DOB/AGE: 1955-05-23 55 y.o.  PCP:   Admit date: 02/26/2011 Discharge date: 03/02/2011  Discharge Diagnoses:  1. Cellulitis and concomitant intertrigo of the lower extremities and pannus underneath her breasts and her abdomen. 2. Morbid obesity. 3. Degenerative joint disease, with chronic left greater than right knee pain. 4. Type 2 diabetes mellitus. Hemoglobin A1c 8.4. 5. Chronic diastolic heart failure; mild pulmonary edema noted, but not clinically decompensated heart failure. 6. History of heart block, status post pacemaker. 7. Hypertension, controlled on multiple antihypertensive medications. 8. Acute renal failure, resolved. Her creatinine was 1.08 at the time of discharge. 9. Iron deficiency anemia. Her total iron was 29, TIBC 265, saturation ratio 11, ferritin 344, and vitamin B12 382. 10. Oxygen-dependent COPD/obesity hypoventilation syndrome, pickwickian syndrome. Stable.   Current Discharge Medication List    START taking these medications   Details  aspirin EC 81 MG EC tablet Take 1 tablet (81 mg total) by mouth daily.    cephALEXin (KEFLEX) 500 MG capsule Take 1 capsule (500 mg total) by mouth 4 (four) times daily. Antibiotic for 3 more days. Qty: 9 capsule, Refills: 0    fluconazole (DIFLUCAN) 200 MG tablet Take 1 tablet (200 mg total) by mouth daily. Antifungal medication. Qty: 3 tablet, Refills: 0    HYDROcodone-acetaminophen (NORCO) 5-325 MG per tablet Take 1 tablet by mouth every 6 (six) hours as needed. Qty: 30 tablet, Refills: 0    nystatin (NYSTOP) 100000 UNIT/GM POWD Apply 1 g (100,000 Units total) topically 3 (three) times daily as needed. Qty: 1 Bottle, Refills: 0    polyethylene glycol (MIRALAX / GLYCOLAX) packet Take 17 g by mouth daily as needed. Qty: 14 each      CONTINUE these medications which have NOT CHANGED   Details  cloNIDine (CATAPRES) 0.2 MG tablet Take 0.2 mg by  mouth 2 (two) times daily.      flecainide (TAMBOCOR) 100 MG tablet TAKE ONE TABLET BY MOUTH TWICE DAILY Qty: 60 tablet, Refills: 0    furosemide (LASIX) 40 MG tablet Take 40 mg by mouth 2 (two) times daily.      glipiZIDE (GLUCOTROL) 5 MG tablet Take 5 mg by mouth 2 (two) times daily before a meal.      hydrALAZINE (APRESOLINE) 100 MG tablet Take 100 mg by mouth 3 (three) times daily.      insulin lispro protamine-insulin lispro (HUMALOG 75/25) (75-25) 100 UNIT/ML SUSP Inject 10-20 Units into the skin 2 (two) times daily with a meal.      labetalol (NORMODYNE) 300 MG tablet Take 300 mg by mouth 2 (two) times daily.      spironolactone (ALDACTONE) 25 MG tablet Take 25 mg by mouth daily.          Discharge Condition: Improved.  Disposition: Home-Health Care Svc   Consults: None.   Significant Diagnostic Studies: Dg Chest Portable 1 View  02/26/2011  *RADIOLOGY REPORT*  Clinical Data: Fever.  Short of breath.  Pacemaker.  PORTABLE CHEST - 1 VIEW  Comparison: 12/01/2010  Findings: The heart is enlarged.  Dual lead pacemaker remains in place.  There is venous hypertension with mild interstitial edema. No effusions.  No significant bony finding.  IMPRESSION: Interstitial edema  Original Report Authenticated By: Thomasenia Sales, M.D.     Microbiology: Recent Results (from the past 240 hour(s))  CULTURE, BLOOD (ROUTINE X 2)     Status: Normal (Preliminary result)   Collection Time  02/26/11 11:20 PM      Component Value Range Status Comment   Specimen Description BLOOD RIGHT FOREARM   Final    Special Requests BOTTLES DRAWN AEROBIC AND ANAEROBIC 6CC   Final    Culture NO GROWTH 3 DAYS   Final    Report Status PENDING   Incomplete   CULTURE, BLOOD (ROUTINE X 2)     Status: Normal (Preliminary result)   Collection Time   02/26/11 11:21 PM      Component Value Range Status Comment   Specimen Description BLOOD RIGHT HAND   Final    Special Requests BOTTLES DRAWN AEROBIC AND  ANAEROBIC 5CC   Final    Culture NO GROWTH 3 DAYS   Final    Report Status PENDING   Incomplete      Labs: Results for orders placed during the hospital encounter of 02/26/11 (from the past 48 hour(s))  GLUCOSE, CAPILLARY     Status: Abnormal   Collection Time   02/28/11 11:39 AM      Component Value Range Comment   Glucose-Capillary 141 (*) 70 - 99 (mg/dL)   GLUCOSE, CAPILLARY     Status: Abnormal   Collection Time   02/28/11  5:00 PM      Component Value Range Comment   Glucose-Capillary 174 (*) 70 - 99 (mg/dL)   GLUCOSE, CAPILLARY     Status: Abnormal   Collection Time   02/28/11  9:39 PM      Component Value Range Comment   Glucose-Capillary 193 (*) 70 - 99 (mg/dL)   FERRITIN     Status: Abnormal   Collection Time   03/01/11  5:30 AM      Component Value Range Comment   Ferritin 344 (*) 10 - 291 (ng/mL)   IRON AND TIBC     Status: Abnormal   Collection Time   03/01/11  5:30 AM      Component Value Range Comment   Iron 29 (*) 42 - 135 (ug/dL)    TIBC 161  096 - 045 (ug/dL)    Saturation Ratios 11 (*) 20 - 55 (%)    UIBC 236  125 - 400 (ug/dL)   VITAMIN W09     Status: Normal   Collection Time   03/01/11  5:30 AM      Component Value Range Comment   Vitamin B-12 382  211 - 911 (pg/mL)   CBC     Status: Abnormal   Collection Time   03/01/11  6:28 AM      Component Value Range Comment   WBC 8.0  4.0 - 10.5 (K/uL)    RBC 3.14 (*) 3.87 - 5.11 (MIL/uL)    Hemoglobin 8.7 (*) 12.0 - 15.0 (g/dL)    HCT 81.1 (*) 91.4 - 46.0 (%)    MCV 90.8  78.0 - 100.0 (fL)    MCH 27.7  26.0 - 34.0 (pg)    MCHC 30.5  30.0 - 36.0 (g/dL)    RDW 78.2 (*) 95.6 - 15.5 (%)    Platelets 291  150 - 400 (K/uL)   BASIC METABOLIC PANEL     Status: Abnormal   Collection Time   03/01/11  6:28 AM      Component Value Range Comment   Sodium 137  135 - 145 (mEq/L)    Potassium 3.7  3.5 - 5.1 (mEq/L)    Chloride 92 (*) 96 - 112 (mEq/L)    CO2 37 (*) 19 - 32 (  mEq/L)    Glucose, Bld 140 (*) 70 -  99 (mg/dL)    BUN 26 (*) 6 - 23 (mg/dL)    Creatinine, Ser 2.95 (*) 0.50 - 1.10 (mg/dL)    Calcium 9.6  8.4 - 10.5 (mg/dL)    GFR calc non Af Amer 53 (*) >90 (mL/min)    GFR calc Af Amer 62 (*) >90 (mL/min)   GLUCOSE, CAPILLARY     Status: Abnormal   Collection Time   03/01/11  7:31 AM      Component Value Range Comment   Glucose-Capillary 159 (*) 70 - 99 (mg/dL)   GLUCOSE, CAPILLARY     Status: Abnormal   Collection Time   03/01/11 12:23 PM      Component Value Range Comment   Glucose-Capillary 179 (*) 70 - 99 (mg/dL)   GLUCOSE, CAPILLARY     Status: Abnormal   Collection Time   03/01/11  4:42 PM      Component Value Range Comment   Glucose-Capillary 138 (*) 70 - 99 (mg/dL)    Comment 1 Notify RN      Comment 2 Documented in Chart     GLUCOSE, CAPILLARY     Status: Abnormal   Collection Time   03/01/11  9:53 PM      Component Value Range Comment   Glucose-Capillary 192 (*) 70 - 99 (mg/dL)   BASIC METABOLIC PANEL     Status: Abnormal   Collection Time   03/02/11  5:25 AM      Component Value Range Comment   Sodium 137  135 - 145 (mEq/L)    Potassium 3.8  3.5 - 5.1 (mEq/L)    Chloride 92 (*) 96 - 112 (mEq/L)    CO2 36 (*) 19 - 32 (mEq/L)    Glucose, Bld 131 (*) 70 - 99 (mg/dL)    BUN 21  6 - 23 (mg/dL)    Creatinine, Ser 6.21  0.50 - 1.10 (mg/dL)    Calcium 9.6  8.4 - 10.5 (mg/dL)    GFR calc non Af Amer 57 (*) >90 (mL/min)    GFR calc Af Amer 66 (*) >90 (mL/min)   CBC     Status: Abnormal   Collection Time   03/02/11  5:25 AM      Component Value Range Comment   WBC 7.4  4.0 - 10.5 (K/uL)    RBC 3.07 (*) 3.87 - 5.11 (MIL/uL)    Hemoglobin 8.6 (*) 12.0 - 15.0 (g/dL)    HCT 30.8 (*) 65.7 - 46.0 (%)    MCV 90.6  78.0 - 100.0 (fL)    MCH 28.0  26.0 - 34.0 (pg)    MCHC 30.9  30.0 - 36.0 (g/dL)    RDW 84.6 (*) 96.2 - 15.5 (%)    Platelets 318  150 - 400 (K/uL)   GLUCOSE, CAPILLARY     Status: Abnormal   Collection Time   03/02/11  7:26 AM      Component Value Range  Comment   Glucose-Capillary 144 (*) 70 - 99 (mg/dL)      HPI : The patient is a 55 year old woman with a past medical history significant for morbid obesity, bedridden state secondary to morbid obesity, status post pacemaker, chronic diastolic congestive heart failure, type 2 diabetes mellitus, and hypertension. She presented to the emergency department with a chief complaint of fever and chills. In the emergency department, she was noted to be afebrile and hemodynamically stable. Her chest x-ray revealed  mild interstitial edema. Her white blood cell count was elevated at 17.2. Her hemoglobin was 10.1. Her urinalysis was contaminated, however, it revealed no nitrites and no leukocytes. She was admitted for further evaluation and management.  HOSPITAL COURSE: The patient was initially presumed to have a viral infection. Blood cultures were ordered on admission, and have remained negative. However, upon further examination, it was obvious that she had cellulitis on her legs and concomitant intertrigo. She was therefore started on broad-spectrum antibiotic treatment with Teflaro and topical antifungal treatment with nystatin. The next day, Diflucan was added. She complained of chronic left knee pain, which she has had for years. She is no longer ambulatory secondary to severe morbid obesity. Her pain was treated with as needed hydromorphone and had some needed Vicodin. On exam, there was no evidence of an effusion or erythema. Her chronic medications were continued to treat hypertension, diabetes, and chronic diastolic heart failure. Although there was some mild interstitial edema on the chest x-ray, clinically, she did not appear to be grossly decompensated. She was maintained on Lasix and Spironolactone.  Her creatinine was within normal limits on admission at 1.04. However he did increase to a high of 1.22, but stabilized at 1.08 upon discharge. Her capillary blood glucose was monitored and was relatively  uncontrolled initially. She was maintained on glipizide. Sliding scale NovoLog and Lantus were added. Upon discharge, however, she was advised to resume 75/25 insulin as previously prescribed by her primary care physician. Her hemoglobin drifted slowly from 9.2-8.6. There was no evidence of gross GI or GU bleeding. An anemia panel was ordered. The results were entered above. Because of the infection, she was not started on iron supplementation, however, she was instructed to take an over-the-counter multivitamin with iron as desired. Because of her morbid obesity, it would be difficult for her to undergo a colonoscopy for further evaluation, although it is recommended. Her TSH was within normal limits at 1.996.  The patient's cellulitis and intertrigo almost completely resolved upon discharge. She received for a half days of antibiotic and antifungal treatment. She was discharged to home on 3 more days of Diflucan, topical nystatin, and Keflex. She remained afebrile. Her white blood cell count completely normalized to 10.3.   Home health registered nurse was ordered for follow up evaluation of her skin at home. A gel overlay for her bariatric bed was ordered as well.     Discharge Exam: Blood pressure 150/78, pulse 67, temperature 97.4 F (36.3 C), temperature source Oral, resp. rate 18, height 5' (1.524 m), weight 184.7 kg (407 lb 3 oz), SpO2 97.00%. Lungs: Clear to auscultation bilaterally. Heart: S1, S2, distant. Abdomen: Morbidly obese, positive bowel sounds, nontender nondistended. Extremities: Trace of pedal edema. Skin: Near resolution of erythema under her pannus, breast, and bilateral lower extremities.    Discharge Orders    Future Orders Please Complete By Expires   Diet - low sodium heart healthy      Increase activity slowly      Discharge instructions      Comments:   Take medications as prescribed. Keep skin clean and dry.      Follow-up Information    Follow up with  HILL,GERALD K. (Follow up as needed.)    Contact information:   [address] [place] [address] [place] [address] [phone]          Signed: Kris No 03/02/2011, 11:28 AM

## 2011-03-02 NOTE — Consult Note (Signed)
CSW arranged transport home via [facility].  Pt's family to be present upon EMS arrival at home. CM notified of pt's medication needs. CSW to sign off.   Karn Cassis

## 2011-03-02 NOTE — Progress Notes (Signed)
WRITER REVIEWED AND DISCUSSED DISCHARGE MEDICATIONS AND DISCHARGE INSTRUCTIONS TO PT, VERBALIZED UNDERSTANDING.  ENCOURAGED TO CALL WITH ANY QUESTIONS THAT MAY ARISE.  NOTES GIVEN ON NEW SCRIPTS AND REVIEWED.  PT FAMILY TOOK ALL BELONGINGS AND PT LEFT VIA EMS IN STABLE CONDITION.

## 2011-03-02 NOTE — Progress Notes (Signed)
Pt d/c home today with her husband. Arranged ahc rn for wound assessment. Also arranged for gel overlay for her bariatric bed at home.

## 2011-03-03 LAB — CULTURE, BLOOD (ROUTINE X 2)

## 2011-03-06 ENCOUNTER — Other Ambulatory Visit: Payer: Self-pay | Admitting: Cardiology

## 2011-03-08 ENCOUNTER — Telehealth: Payer: Self-pay | Admitting: Cardiology

## 2011-03-08 NOTE — Telephone Encounter (Signed)
Please advise as pt cannot make appointments.

## 2011-03-08 NOTE — Telephone Encounter (Signed)
Refill Rx.  We need a trough flecainide level with the assistance of Home Health or Hospice, whoever is assisting.

## 2011-03-08 NOTE — Telephone Encounter (Signed)
PT NEEDS RX REFILL FOR FLECAINIDE 100MG  1 A DAY. [facility] IN [place].  PER PT HUSBAND SHE IS BED RIDDEN AND UNABLE TO COME IN FOR APPOINTMENTS.

## 2011-03-09 ENCOUNTER — Encounter: Payer: Self-pay | Admitting: *Deleted

## 2011-03-09 ENCOUNTER — Other Ambulatory Visit: Payer: Self-pay | Admitting: *Deleted

## 2011-03-09 DIAGNOSIS — I498 Other specified cardiac arrhythmias: Secondary | ICD-10-CM

## 2011-03-09 MED ORDER — FLECAINIDE ACETATE 100 MG PO TABS: 100.0000 mg | ORAL_TABLET | Freq: Two times a day (BID) | ORAL | Status: DC

## 2011-03-13 ENCOUNTER — Telehealth: Payer: Self-pay | Admitting: Cardiology

## 2011-03-13 NOTE — Telephone Encounter (Signed)
PATIENT RETURNING CALL FROM LETTER RECIEVED BY TOMI. PHONE NUMBER HAS BEEN UPDATED.

## 2011-03-28 ENCOUNTER — Encounter (HOSPITAL_COMMUNITY): Payer: Self-pay

## 2011-03-28 ENCOUNTER — Inpatient Hospital Stay (HOSPITAL_COMMUNITY)
Admission: EM | Admit: 2011-03-28 | Discharge: 2011-04-02 | DRG: 603 | Disposition: A | Discharge status: Home Health | Payer: Medicaid Other | Attending: Internal Medicine | Admitting: Internal Medicine

## 2011-03-28 DIAGNOSIS — M1710 Unilateral primary osteoarthritis, unspecified knee: Secondary | ICD-10-CM

## 2011-03-28 DIAGNOSIS — L02419 Cutaneous abscess of limb, unspecified: Principal | ICD-10-CM | POA: Diagnosis present

## 2011-03-28 DIAGNOSIS — M069 Rheumatoid arthritis, unspecified: Secondary | ICD-10-CM

## 2011-03-28 DIAGNOSIS — M545 Low back pain, unspecified: Secondary | ICD-10-CM

## 2011-03-28 DIAGNOSIS — J4489 Other specified chronic obstructive pulmonary disease: Secondary | ICD-10-CM | POA: Diagnosis present

## 2011-03-28 DIAGNOSIS — E871 Hypo-osmolality and hyponatremia: Secondary | ICD-10-CM

## 2011-03-28 DIAGNOSIS — I509 Heart failure, unspecified: Secondary | ICD-10-CM | POA: Diagnosis present

## 2011-03-28 DIAGNOSIS — N179 Acute kidney failure, unspecified: Secondary | ICD-10-CM

## 2011-03-28 DIAGNOSIS — E1165 Type 2 diabetes mellitus with hyperglycemia: Secondary | ICD-10-CM | POA: Diagnosis present

## 2011-03-28 DIAGNOSIS — Z45018 Encounter for adjustment and management of other part of cardiac pacemaker: Secondary | ICD-10-CM

## 2011-03-28 DIAGNOSIS — IMO0001 Reserved for inherently not codable concepts without codable children: Secondary | ICD-10-CM | POA: Diagnosis present

## 2011-03-28 DIAGNOSIS — R0602 Shortness of breath: Secondary | ICD-10-CM

## 2011-03-28 DIAGNOSIS — Z950 Presence of cardiac pacemaker: Secondary | ICD-10-CM

## 2011-03-28 DIAGNOSIS — R001 Bradycardia, unspecified: Secondary | ICD-10-CM

## 2011-03-28 DIAGNOSIS — I10 Essential (primary) hypertension: Secondary | ICD-10-CM

## 2011-03-28 DIAGNOSIS — E785 Hyperlipidemia, unspecified: Secondary | ICD-10-CM

## 2011-03-28 DIAGNOSIS — I5033 Acute on chronic diastolic (congestive) heart failure: Secondary | ICD-10-CM

## 2011-03-28 DIAGNOSIS — L03119 Cellulitis of unspecified part of limb: Principal | ICD-10-CM

## 2011-03-28 DIAGNOSIS — E662 Morbid (severe) obesity with alveolar hypoventilation: Secondary | ICD-10-CM

## 2011-03-28 DIAGNOSIS — J449 Chronic obstructive pulmonary disease, unspecified: Secondary | ICD-10-CM

## 2011-03-28 DIAGNOSIS — K219 Gastro-esophageal reflux disease without esophagitis: Secondary | ICD-10-CM | POA: Diagnosis present

## 2011-03-28 DIAGNOSIS — L304 Erythema intertrigo: Secondary | ICD-10-CM

## 2011-03-28 DIAGNOSIS — L03115 Cellulitis of right lower limb: Secondary | ICD-10-CM

## 2011-03-28 DIAGNOSIS — E119 Type 2 diabetes mellitus without complications: Secondary | ICD-10-CM

## 2011-03-28 DIAGNOSIS — I5032 Chronic diastolic (congestive) heart failure: Secondary | ICD-10-CM

## 2011-03-28 DIAGNOSIS — J45909 Unspecified asthma, uncomplicated: Secondary | ICD-10-CM

## 2011-03-28 DIAGNOSIS — M1990 Unspecified osteoarthritis, unspecified site: Secondary | ICD-10-CM

## 2011-03-28 DIAGNOSIS — D509 Iron deficiency anemia, unspecified: Secondary | ICD-10-CM

## 2011-03-28 DIAGNOSIS — M25569 Pain in unspecified knee: Secondary | ICD-10-CM

## 2011-03-28 DIAGNOSIS — E860 Dehydration: Secondary | ICD-10-CM

## 2011-03-28 DIAGNOSIS — N318 Other neuromuscular dysfunction of bladder: Secondary | ICD-10-CM

## 2011-03-28 DIAGNOSIS — G4733 Obstructive sleep apnea (adult) (pediatric): Secondary | ICD-10-CM

## 2011-03-28 DIAGNOSIS — M179 Osteoarthritis of knee, unspecified: Secondary | ICD-10-CM

## 2011-03-28 DIAGNOSIS — I498 Other specified cardiac arrhythmias: Secondary | ICD-10-CM

## 2011-03-28 DIAGNOSIS — Z8679 Personal history of other diseases of the circulatory system: Secondary | ICD-10-CM

## 2011-03-28 DIAGNOSIS — Z6841 Body Mass Index (BMI) 40.0 and over, adult: Secondary | ICD-10-CM

## 2011-03-28 DIAGNOSIS — K5900 Constipation, unspecified: Secondary | ICD-10-CM | POA: Diagnosis present

## 2011-03-28 DIAGNOSIS — Z9981 Dependence on supplemental oxygen: Secondary | ICD-10-CM

## 2011-03-28 DIAGNOSIS — B372 Candidiasis of skin and nail: Secondary | ICD-10-CM | POA: Diagnosis present

## 2011-03-28 DIAGNOSIS — I2510 Atherosclerotic heart disease of native coronary artery without angina pectoris: Secondary | ICD-10-CM

## 2011-03-28 DIAGNOSIS — I442 Atrioventricular block, complete: Secondary | ICD-10-CM

## 2011-03-28 DIAGNOSIS — IMO0002 Reserved for concepts with insufficient information to code with codable children: Secondary | ICD-10-CM | POA: Diagnosis present

## 2011-03-28 HISTORY — DX: Unspecified osteoarthritis, unspecified site: M19.90

## 2011-03-28 HISTORY — DX: Acute myocardial infarction, unspecified: I21.9

## 2011-03-28 HISTORY — DX: Cellulitis, unspecified: L03.90

## 2011-03-28 HISTORY — DX: Diaphragmatic hernia without obstruction or gangrene: K44.9

## 2011-03-28 LAB — CBC
Hemoglobin: 10 g/dL — ABNORMAL LOW (ref 12.0–15.0)
MCH: 27.4 pg (ref 26.0–34.0)
MCHC: 31.3 g/dL (ref 30.0–36.0)
MCV: 87.7 fL (ref 78.0–100.0)
Platelets: 256 10*3/uL (ref 150–400)

## 2011-03-28 LAB — DIFFERENTIAL
Basophils Relative: 0 % (ref 0–1)
Eosinophils Absolute: 0.3 10*3/uL (ref 0.0–0.7)
Eosinophils Relative: 3 % (ref 0–5)
Lymphs Abs: 0.9 10*3/uL (ref 0.7–4.0)
Monocytes Relative: 7 % (ref 3–12)
Neutrophils Relative %: 83 % — ABNORMAL HIGH (ref 43–77)

## 2011-03-28 LAB — BASIC METABOLIC PANEL
BUN: 34 mg/dL — ABNORMAL HIGH (ref 6–23)
CO2: 34 mEq/L — ABNORMAL HIGH (ref 19–32)
Calcium: 9.3 mg/dL (ref 8.4–10.5)
GFR calc non Af Amer: 42 mL/min — ABNORMAL LOW (ref >90)
Glucose, Bld: 159 mg/dL — ABNORMAL HIGH (ref 70–99)

## 2011-03-28 MED ORDER — INSULIN LISPRO PROT & LISPRO (75-25 MIX) 100 UNIT/ML ~~LOC~~ SUSP: 10.0000 [IU] | Freq: Two times a day (BID) | SUBCUTANEOUS | Status: DC

## 2011-03-28 MED ORDER — HYDROMORPHONE HCL PF 1 MG/ML IJ SOLN
1.0000 mg | Freq: Once | INTRAMUSCULAR | Status: AC
  Administered 2011-03-28: 1 mg via INTRAVENOUS
  Filled 2011-03-28: qty 1

## 2011-03-28 MED ORDER — ENOXAPARIN SODIUM 40 MG/0.4ML ~~LOC~~ SOLN
40.0000 mg | Freq: Every day | SUBCUTANEOUS | Status: DC
  Administered 2011-03-29: 40 mg via SUBCUTANEOUS
  Filled 2011-03-28: qty 0.4

## 2011-03-28 MED ORDER — GLIPIZIDE 5 MG PO TABS
5.0000 mg | ORAL_TABLET | Freq: Two times a day (BID) | ORAL | Status: DC
  Administered 2011-03-29 – 2011-04-02 (×9): 5 mg via ORAL
  Filled 2011-03-28 (×9): qty 1

## 2011-03-28 MED ORDER — ASPIRIN EC 81 MG PO TBEC
81.0000 mg | DELAYED_RELEASE_TABLET | Freq: Every day | ORAL | Status: DC
  Administered 2011-03-29 – 2011-04-02 (×5): 81 mg via ORAL
  Filled 2011-03-28 (×5): qty 1

## 2011-03-28 MED ORDER — HYDRALAZINE HCL 25 MG PO TABS
100.0000 mg | ORAL_TABLET | Freq: Three times a day (TID) | ORAL | Status: DC
  Administered 2011-03-29 – 2011-04-02 (×14): 100 mg via ORAL
  Filled 2011-03-28 (×4): qty 4
  Filled 2011-03-28: qty 1
  Filled 2011-03-28 (×9): qty 4

## 2011-03-28 MED ORDER — SODIUM CHLORIDE 0.9 % IV SOLN: INTRAVENOUS | Status: AC

## 2011-03-28 MED ORDER — THERA M PLUS PO TABS
1.0000 | ORAL_TABLET | Freq: Every day | ORAL | Status: DC
  Administered 2011-03-29 – 2011-04-02 (×5): 1 via ORAL
  Filled 2011-03-28 (×5): qty 1

## 2011-03-28 MED ORDER — CLONIDINE HCL 0.2 MG PO TABS
0.2000 mg | ORAL_TABLET | Freq: Two times a day (BID) | ORAL | Status: DC
  Administered 2011-03-29 – 2011-04-02 (×10): 0.2 mg via ORAL
  Filled 2011-03-28 (×10): qty 1

## 2011-03-28 MED ORDER — FUROSEMIDE 40 MG PO TABS
40.0000 mg | ORAL_TABLET | Freq: Two times a day (BID) | ORAL | Status: DC
  Filled 2011-03-28: qty 1

## 2011-03-28 MED ORDER — FLECAINIDE ACETATE 100 MG PO TABS
100.0000 mg | ORAL_TABLET | Freq: Two times a day (BID) | ORAL | Status: DC
  Administered 2011-03-29 – 2011-04-02 (×9): 100 mg via ORAL
  Filled 2011-03-28 (×14): qty 1

## 2011-03-28 MED ORDER — VANCOMYCIN HCL IN DEXTROSE 1-5 GM/200ML-% IV SOLN
1000.0000 mg | Freq: Once | INTRAVENOUS | Status: AC
  Administered 2011-03-28: 1000 mg via INTRAVENOUS
  Filled 2011-03-28: qty 200

## 2011-03-28 MED ORDER — LABETALOL HCL 200 MG PO TABS
300.0000 mg | ORAL_TABLET | Freq: Two times a day (BID) | ORAL | Status: DC
  Administered 2011-03-29 – 2011-04-02 (×10): 300 mg via ORAL
  Filled 2011-03-28: qty 2
  Filled 2011-03-28: qty 1
  Filled 2011-03-28 (×5): qty 2
  Filled 2011-03-28: qty 1
  Filled 2011-03-28 (×2): qty 2

## 2011-03-28 MED ORDER — FERROUS SULFATE 325 (65 FE) MG PO TABS
325.0000 mg | ORAL_TABLET | Freq: Every day | ORAL | Status: DC
  Administered 2011-03-29 – 2011-04-02 (×5): 325 mg via ORAL
  Filled 2011-03-28 (×5): qty 1

## 2011-03-28 MED ORDER — SODIUM CHLORIDE 0.9 % IV SOLN
Freq: Once | INTRAVENOUS | Status: AC
  Administered 2011-03-28: 20:00:00 via INTRAVENOUS

## 2011-03-28 MED ORDER — SPIRONOLACTONE 25 MG PO TABS
25.0000 mg | ORAL_TABLET | Freq: Every day | ORAL | Status: DC
  Administered 2011-03-29 – 2011-04-02 (×5): 25 mg via ORAL
  Filled 2011-03-28 (×5): qty 1

## 2011-03-28 NOTE — ED Notes (Signed)
Treated for left leg infection 2 weeks ago, began having same symptoms 2 days ago in right leg, pt is non-ambulatory for 2 years.

## 2011-03-28 NOTE — ED Notes (Signed)
Pt given ice chips per request.  Multiple family members at bedside.

## 2011-03-28 NOTE — ED Notes (Signed)
Pt brought to er by ems for right leg pain, has been having for several days, pt is morbidly obese. And has not been out of bed for 2 years. Is assisted by family was just seen and treated in hosp. 2-3 weeks ago for same, is unable to follow up with pmd--dr.hill.  Both legs are swollen, right is red and more painful to touch per pt.  Large area of ecchymosis and peeling noted to right leg, left is peeling skin as well.  +cap refill, able to wiggle toes on command.

## 2011-03-28 NOTE — ED Notes (Signed)
Report called to accepting rn.

## 2011-03-28 NOTE — ED Provider Notes (Signed)
Scribed for Joya Gaskins, MD, the patient was seen in room APA03/APA03. This chart was scribed by [employer]. The patient's care started at 19:54  CSN: [id number] Arrival date & time: 03/28/2011  7:27 PM   First MD Initiated Contact with Patient 03/28/11 1954      Chief Complaint  Patient presents with  . Leg Pain    right pain and infection for several days.   HPI [name] is a 55 y.o. female who presents to the Emergency Department complaining of right Leg pain. She reports that she was treated for a Left leg infection 2 weeks ago. She reports that she started having the same symptoms for which she was treated 2 days ago. Denies vomiting but reports nausea. She also reports diarrhea. Patient is non-ambulatory at baseline. Patient is morbidly obese. No injury to leg Course - The pain/infection is getting worse Duration - constant and worsening  Past Medical History  Diagnosis Date  . Morbid obesity   . Diabetes mellitus   . Hypertension   . CHF (congestive heart failure)     Diastolic  . Pacemaker   . COPD (chronic obstructive pulmonary disease)   . Oxygen dependent   . Pickwickian syndrome   . Chronic back pain   . Coronary artery disease   . Shortness of breath   . Obesity hypoventilation syndrome     Past Surgical History  Procedure Date  . Abdominal hysterectomy   . Pacemaker insertion     Family History  Problem Relation Age of Onset  . Coronary artery disease Mother     MI age 27, died.  . Hypertension Father   . Osteoarthritis Father   . Hypertension Sister   . Hypertension Brother   . Obesity Father     History  Substance Use Topics  . Smoking status: Former Games developer  . Smokeless tobacco: Never Used  . Alcohol Use: No    OB History    Grav Para Term Preterm Abortions TAB SAB Ect Mult Living                  Review of Systems  All other systems reviewed and are negative.    Allergies  Review of patient's allergies indicates  no known allergies.  Home Medications   Current Outpatient Rx  Name Route Sig Dispense Refill  . ASPIRIN 81 MG PO TBEC Oral Take 1 tablet (81 mg total) by mouth daily.    Marland Kitchen CLONIDINE HCL 0.2 MG PO TABS Oral Take 0.2 mg by mouth 2 (two) times daily.      Marland Kitchen FERROUS SULFATE 325 (65 FE) MG PO TABS Oral Take 325 mg by mouth daily.      Marland Kitchen FLECAINIDE ACETATE 100 MG PO TABS Oral Take 1 tablet (100 mg total) by mouth 2 (two) times daily. 60 tablet 12    Needs trough level drawn per home health   . FUROSEMIDE 40 MG PO TABS Oral Take 40 mg by mouth 2 (two) times daily.      Marland Kitchen GLIPIZIDE 5 MG PO TABS Oral Take 5 mg by mouth 2 (two) times daily before a meal.      . HYDRALAZINE HCL 100 MG PO TABS Oral Take 100 mg by mouth 3 (three) times daily.      . INSULIN LISPRO PROT & LISPRO (75-25) 100 UNIT/ML [place] SUSP Subcutaneous Inject 10-20 Units into the skin 2 (two) times daily with a meal.      .  LABETALOL HCL 300 MG PO TABS Oral Take 300 mg by mouth 2 (two) times daily.      Carma Leaven M PLUS PO TABS Oral Take 1 tablet by mouth daily.      Marland Kitchen NAPROXEN SODIUM 220 MG PO TABS Oral Take 220 mg by mouth daily as needed. For arthritis pain     . SPIRONOLACTONE 25 MG PO TABS Oral Take 25 mg by mouth daily.        BP 109/63  Pulse 86  Temp(Src) 99.8 F (37.7 C) (Oral)  Resp 20  Ht 5' (1.524 m)  Wt 404 lb (183.253 kg)  BMI 78.90 kg/m2  SpO2 91%  Physical Exam CONSTITUTIONAL: Well developed/well nourished HEAD AND FACE: Normocephalic/atraumatic EYES: EOMI/PERRL ENMT: Mucous membranes moist NECK: supple no meningeal signs SPINE:entire spine nontender CV: S1/S2 noted, no murmurs/rubs/gallops noted LUNGS: Lungs are clear to auscultation bilaterally, no apparent distress ABDOMEN: soft, nontender, no rebound or guarding, obese  NEURO: Pt is awake/alert, moves all extremitiesx4 EXTREMITIES: pulses normal , Erythema from the thighs down to the calf. No focal abscess.  nontender over right knee.  No crepitance.     SKIN: warm, color normal PSYCH: no abnormalities of mood noted    ED Course  Procedures   DIAGNOSTIC STUDIES: Oxygen Saturation is 91% on [place], normal by my interpretation.    COORDINATION OF CARE: 20:03 - EDP examined patient at bedside and ordered the following. Orders Placed This Encounter  Procedures  . Basic metabolic panel  . CBC  . Differential    Pt reports she feels improved She is stable at this time Pt obesity and has large amt of cellulitis to right LE, will need IV abx Pt agreeable D/w dr Onalee Hua at 2141 for admission    MDM: Nursing notes reviewed and considered in documentation All labs/vitals reviewed and considered    Scribe Attestation I personally performed the services described in this documentation, which was scribed in my presence. The recorded information has been reviewed and considered.       Joya Gaskins, MD 03/28/11 2142

## 2011-03-29 ENCOUNTER — Encounter (HOSPITAL_COMMUNITY): Payer: Self-pay | Admitting: Internal Medicine

## 2011-03-29 DIAGNOSIS — L03119 Cellulitis of unspecified part of limb: Secondary | ICD-10-CM | POA: Diagnosis present

## 2011-03-29 DIAGNOSIS — E662 Morbid (severe) obesity with alveolar hypoventilation: Secondary | ICD-10-CM | POA: Diagnosis present

## 2011-03-29 DIAGNOSIS — J449 Chronic obstructive pulmonary disease, unspecified: Secondary | ICD-10-CM | POA: Diagnosis present

## 2011-03-29 DIAGNOSIS — E871 Hypo-osmolality and hyponatremia: Secondary | ICD-10-CM | POA: Diagnosis present

## 2011-03-29 LAB — CBC
MCHC: 32 g/dL (ref 30.0–36.0)
Platelets: 232 10*3/uL (ref 150–400)
RDW: 16.5 % — ABNORMAL HIGH (ref 11.5–15.5)
WBC: 12.4 10*3/uL — ABNORMAL HIGH (ref 4.0–10.5)

## 2011-03-29 LAB — GLUCOSE, CAPILLARY
Glucose-Capillary: 212 mg/dL — ABNORMAL HIGH (ref 70–99)
Glucose-Capillary: 205 mg/dL — ABNORMAL HIGH (ref 70–99)

## 2011-03-29 LAB — BASIC METABOLIC PANEL
BUN: 36 mg/dL — ABNORMAL HIGH (ref 6–23)
Chloride: 87 mEq/L — ABNORMAL LOW (ref 96–112)
Creatinine, Ser: 1.55 mg/dL — ABNORMAL HIGH (ref 0.50–1.10)
GFR calc Af Amer: 42 mL/min — ABNORMAL LOW (ref >90)
GFR calc non Af Amer: 37 mL/min — ABNORMAL LOW (ref >90)

## 2011-03-29 LAB — VANCOMYCIN, TROUGH: Vancomycin Tr: 25.3 ug/mL (ref 10.0–20.0)

## 2011-03-29 MED ORDER — ENOXAPARIN SODIUM 100 MG/ML ~~LOC~~ SOLN
0.5000 mg/kg | Freq: Every day | SUBCUTANEOUS | Status: DC
  Administered 2011-03-29 – 2011-04-02 (×5): 90 mg via SUBCUTANEOUS
  Filled 2011-03-29 (×8): qty 1

## 2011-03-29 MED ORDER — SODIUM CHLORIDE 0.9 % IV SOLN
600.0000 mg | Freq: Two times a day (BID) | INTRAVENOUS | Status: DC
  Administered 2011-03-29 – 2011-04-02 (×9): 600 mg via INTRAVENOUS
  Filled 2011-03-29 (×13): qty 600

## 2011-03-29 MED ORDER — HYDROMORPHONE HCL PF 1 MG/ML IJ SOLN
0.5000 mg | INTRAMUSCULAR | Status: DC | PRN
  Administered 2011-03-29: 22:00:00 via INTRAVENOUS
  Administered 2011-03-30 – 2011-04-02 (×5): 0.5 mg via INTRAVENOUS
  Filled 2011-03-29 (×6): qty 1

## 2011-03-29 MED ORDER — INSULIN ASPART PROT & ASPART (70-30 MIX) 100 UNIT/ML ~~LOC~~ SUSP
10.0000 [IU] | Freq: Two times a day (BID) | SUBCUTANEOUS | Status: DC
  Administered 2011-03-29: 15 [IU] via SUBCUTANEOUS
  Filled 2011-03-29: qty 3

## 2011-03-29 MED ORDER — VANCOMYCIN HCL IN DEXTROSE 1-5 GM/200ML-% IV SOLN
INTRAVENOUS | Status: AC
  Filled 2011-03-29: qty 200

## 2011-03-29 MED ORDER — HYDROCODONE-ACETAMINOPHEN 5-325 MG PO TABS
1.0000 | ORAL_TABLET | Freq: Once | ORAL | Status: AC
  Administered 2011-03-29: 1 via ORAL
  Filled 2011-03-29: qty 1

## 2011-03-29 MED ORDER — INSULIN ASPART PROT & ASPART (70-30 MIX) 100 UNIT/ML ~~LOC~~ SUSP
15.0000 [IU] | Freq: Two times a day (BID) | SUBCUTANEOUS | Status: DC
  Administered 2011-03-29 – 2011-03-30 (×2): 15 [IU] via SUBCUTANEOUS

## 2011-03-29 MED ORDER — ONDANSETRON HCL 4 MG/2ML IJ SOLN
4.0000 mg | Freq: Four times a day (QID) | INTRAMUSCULAR | Status: DC | PRN
  Administered 2011-03-29 – 2011-04-02 (×2): 4 mg via INTRAVENOUS
  Filled 2011-03-29 (×2): qty 2

## 2011-03-29 MED ORDER — NYSTATIN 100000 UNIT/GM EX CREA
TOPICAL_CREAM | Freq: Two times a day (BID) | CUTANEOUS | Status: DC
  Administered 2011-03-29 – 2011-04-02 (×9): via TOPICAL
  Filled 2011-03-29: qty 15

## 2011-03-29 MED ORDER — FUROSEMIDE 20 MG PO TABS
20.0000 mg | ORAL_TABLET | Freq: Two times a day (BID) | ORAL | Status: DC
  Administered 2011-03-29 – 2011-04-02 (×5): 20 mg via ORAL
  Filled 2011-03-29 (×6): qty 1

## 2011-03-29 MED ORDER — VANCOMYCIN HCL IN DEXTROSE 1-5 GM/200ML-% IV SOLN
1000.0000 mg | Freq: Three times a day (TID) | INTRAVENOUS | Status: DC
  Administered 2011-03-29: 1000 mg via INTRAVENOUS
  Filled 2011-03-29 (×2): qty 200

## 2011-03-29 MED ORDER — HYDROCODONE-ACETAMINOPHEN 5-325 MG PO TABS
1.0000 | ORAL_TABLET | ORAL | Status: DC | PRN
  Administered 2011-03-29 – 2011-04-01 (×5): 1 via ORAL
  Filled 2011-03-29 (×6): qty 1

## 2011-03-29 MED ORDER — INSULIN ASPART 100 UNIT/ML ~~LOC~~ SOLN: 0.0000 [IU] | Freq: Every day | SUBCUTANEOUS | Status: DC

## 2011-03-29 MED ORDER — INSULIN ASPART 100 UNIT/ML ~~LOC~~ SOLN
0.0000 [IU] | Freq: Three times a day (TID) | SUBCUTANEOUS | Status: DC
Start: 2011-03-29 — End: 2011-04-02
  Administered 2011-03-29: 2 [IU] via SUBCUTANEOUS
  Administered 2011-03-29: 3 [IU] via SUBCUTANEOUS
  Administered 2011-03-30 (×2): 2 [IU] via SUBCUTANEOUS
  Administered 2011-03-30: 3 [IU] via SUBCUTANEOUS
  Administered 2011-03-31: 2 [IU] via SUBCUTANEOUS
  Administered 2011-03-31: 1 [IU] via SUBCUTANEOUS
  Administered 2011-03-31: 2 [IU] via SUBCUTANEOUS
  Administered 2011-04-01: 1 [IU] via SUBCUTANEOUS
  Administered 2011-04-01: 2 [IU] via SUBCUTANEOUS
  Administered 2011-04-01 – 2011-04-02 (×2): 1 [IU] via SUBCUTANEOUS
  Administered 2011-04-02: 2 [IU] via SUBCUTANEOUS
  Filled 2011-03-29: qty 3

## 2011-03-29 NOTE — Progress Notes (Signed)
Attempted to do PT eval.  Pt declines PT.  She states that she is unable to tolerate any movement of RLE due to knee pain.  She has been bed bound for a long time because of orthopedic pain and numerous trials of PT (including inpatient rehab...where they "worked me too hard") has not been fruitful.  She feels that once her leg cellulitis is resolved, she could try some PT to get back into some leg exercise to help her feel better.  She appears incapable of self-help because of her size.Marland KitchenMarland Kitchenperhaps HHPT to instruct family members in some exercise might be of value, although this may already have been tried.

## 2011-03-29 NOTE — Progress Notes (Signed)
Subjective: The patient complains of bilateral knee pain, worse on the left. This is normal for her. Otherwise, she has no complaints.  Objective: Vital signs in last 24 hours: Filed Vitals:   03/28/11 2122 03/28/11 2251 03/29/11 0546 03/29/11 0700  BP: 124/61 131/76 115/68   Pulse: 75 74 69   Temp: 98.1 F (36.7 C) 99.3 F (37.4 C) 98.1 F (36.7 C)   TempSrc: Oral Oral    Resp: 20 20 20    Height:  4\' 11"  (1.499 m)    Weight:  177.6 kg (391 lb 8.6 oz)  178.853 kg (394 lb 4.8 oz)  SpO2: 100% 99% 99%     Intake/Output Summary (Last 24 hours) at 03/29/11 1028 Last data filed at 03/29/11 0700  Gross per 24 hour  Intake    240 ml  Output    350 ml  Net   -110 ml    Weight change:   Exam: Lungs: clear to auscultation bilaterally. Heart: S1, S2, and a 2-3/6 systolic murmur. Abdomen: Morbidly obese, soft, nontender, and nondistended. Large pannus with erythema and intertrigo. Extremities: Diffuse erythema over the right thigh extending down to the right leg, and just above the right ankle. 1+ nonpitting edema. Mildly tender to palpation. Both knees with mild arthritic changes. Mild tenderness to palpation of the left knee over the patella. No joint effusion, no hot red joints.  Lab Results: Basic Metabolic Panel:  Basename 03/29/11 0442 03/28/11 2017  NA 130* 131*  K 3.7 4.5  CL 87* 87*  CO2 33* 34*  GLUCOSE 177* 159*  BUN 36* 34*  CREATININE 1.55* 1.38*  CALCIUM 9.3 9.3  MG -- --  PHOS -- --   Liver Function Tests: No results found for this basename: AST:2,ALT:2,ALKPHOS:2,BILITOT:2,PROT:2,ALBUMIN:2 in the last 72 hours No results found for this basename: LIPASE:2,AMYLASE:2 in the last 72 hours No results found for this basename: AMMONIA:2 in the last 72 hours CBC:  Basename 03/29/11 0442 03/28/11 2017  WBC 12.4* 13.5*  NEUTROABS -- 11.2*  HGB 9.3* 10.0*  HCT 29.1* 32.0*  MCV 87.7 87.7  PLT 232 256   Cardiac Enzymes: No results found for this basename:  CKTOTAL:3,CKMB:3,CKMBINDEX:3,TROPONINI:3 in the last 72 hours BNP: No results found for this basename: POCBNP:3 in the last 72 hours D-Dimer: No results found for this basename: DDIMER:2 in the last 72 hours CBG:  Basename 03/29/11 0722  GLUCAP 212*   Hemoglobin A1C: No results found for this basename: HGBA1C in the last 72 hours Fasting Lipid Panel: No results found for this basename: CHOL,HDL,LDLCALC,TRIG,CHOLHDL,LDLDIRECT in the last 72 hours Thyroid Function Tests: No results found for this basename: TSH,T4TOTAL,FREET4,T3FREE,THYROIDAB in the last 72 hours Anemia Panel: No results found for this basename: VITAMINB12,FOLATE,FERRITIN,TIBC,IRON,RETICCTPCT in the last 72 hours Coagulation: No results found for this basename: LABPROT:2,INR:2 in the last 72 hours Urine Drug Screen:  Alcohol Level: No results found for this basename: ETH:2 in the last 72 hours Urinalysis:  Misc. Labs:  Micro: No results found for this or any previous visit (from the past 240 hour(s)).  Studies/Results: No results found.  Medications: I have reviewed the patient's current medications.  Assessment: Active Problems:  DIABETES MELLITUS, TYPE II  OBESITY, MORBID  Anemia, iron deficiency  GERD  OSTEOARTHRITIS  PACEMAKER, PERMANENT  Chronic diastolic heart failure  ARF (acute renal failure)  Cellulitis of leg  COPD (chronic obstructive pulmonary disease)  Obesity hypoventilation syndrome  Hyponatremia  1. Recurrent right lower extremity cellulitis. She is currently being treated with  vancomycin. However, during the previous hospitalization, she recovered very well with Teflaro. We will therefore discontinue vancomycin and start Teflaro. We'll also add topical nystatin cream for mild superimposed fungal intertrigo.  Acute renal failure. Her creatinine prior to discharge in October 2012 was 1.08. She appears to be volume depleted in the setting of Lasix and Aldactone therapy. We will  therefore, decrease the dose of Lasix from 40 mg twice a day to 20 mg twice a day. Will increase IV fluids to 75 cc an hour and watch her pulmonary status closely.  Hyponatremia. This is likely secondary to volume depletion. As above, will increase the rate of IV fluids and follow her serum sodium and renal function.  Type 2 diabetes mellitus. She is treated chronically with glipizide and 70/30 insulin. Will adjust the 70/30 insulin to 15 units twice a day and add sliding scale NovoLog.  Chronic diastolic heart failure. Currently compensated.  Morbid obesity, obesity hypoventilation syndrome, and oxygen-dependent COPD. Stable.  Chronic and deficiency anemia. Will follow.  Osteoarthritis of both knees with chronic arthritic pain. Will start when necessary hydrocodone and when necessary Dilaudid.   Plan: 1. As above. Will also order additional laboratory studies in the morning.   LOS: 1 day   [name] 03/29/2011, 10:28 AM

## 2011-03-29 NOTE — H&P (Addendum)
PCP:   No primary provider on file.   Chief Complaint:  lle redness  HPI: 55 yo female h/o obesity, dm, chf, copd on 3l 02 at home comes in with 2 days of fever, chills and lle cellulitis.  Pt is bedbound due to her weight and has not walked in 2 years.  She has been on a diet recently and has lost 15 lbs.  Review of Systems:  The patient denies anorexia, weight loss,, vision loss, decreased hearing, hoarseness, chest pain, syncope, dyspnea on exertion, peripheral edema, balance deficits, hemoptysis, abdominal pain, melena, hematochezia, severe indigestion/heartburn, hematuria, incontinence, genital sores, muscle weakness, suspicious skin lesions, transient blindness, difficulty walking, depression, unusual weight change, abnormal bleeding, enlarged lymph nodes, angioedema, and breast masses.  Past Medical History: Past Medical History  Diagnosis Date  . Morbid obesity   . Diabetes mellitus   . Hypertension   . CHF (congestive heart failure)     Diastolic  . Pacemaker   . COPD (chronic obstructive pulmonary disease)   . Oxygen dependent   . Pickwickian syndrome   . Chronic back pain   . Coronary artery disease   . Shortness of breath   . Obesity hypoventilation syndrome   . Myocardial infarction   . Arthritis   . Hiatal hernia    Past Surgical History  Procedure Date  . Abdominal hysterectomy   . Pacemaker insertion     Medications: Prior to Admission medications   Medication Sig Start Date End Date Taking? Authorizing Provider  aspirin EC 81 MG EC tablet Take 1 tablet (81 mg total) by mouth daily. 03/02/11 03/01/12 Yes Denise Fisher  cloNIDine (CATAPRES) 0.2 MG tablet Take 0.2 mg by mouth 2 (two) times daily.     Yes Historical Provider, MD  ferrous sulfate 325 (65 FE) MG tablet Take 325 mg by mouth daily.     Yes Historical Provider, MD  flecainide (TAMBOCOR) 100 MG tablet Take 1 tablet (100 mg total) by mouth 2 (two) times daily. 03/09/11  Yes Gerrit Friends. Rothbart, MD    furosemide (LASIX) 40 MG tablet Take 40 mg by mouth 2 (two) times daily.     Yes Historical Provider, MD  glipiZIDE (GLUCOTROL) 5 MG tablet Take 5 mg by mouth 2 (two) times daily before a meal.     Yes Historical Provider, MD  hydrALAZINE (APRESOLINE) 100 MG tablet Take 100 mg by mouth 3 (three) times daily.     Yes Historical Provider, MD  insulin lispro protamine-insulin lispro (HUMALOG 75/25) (75-25) 100 UNIT/ML SUSP Inject 10-20 Units into the skin 2 (two) times daily with a meal.     Yes Historical Provider, MD  labetalol (NORMODYNE) 300 MG tablet Take 300 mg by mouth 2 (two) times daily.     Yes Historical Provider, MD  Multiple Vitamins-Minerals (MULTIVITAMINS THER. W/MINERALS) TABS Take 1 tablet by mouth daily.     Yes Historical Provider, MD  naproxen sodium (ANAPROX) 220 MG tablet Take 220 mg by mouth daily as needed. For arthritis pain    Yes Historical Provider, MD  spironolactone (ALDACTONE) 25 MG tablet Take 25 mg by mouth daily.     Yes Historical Provider, MD    Allergies:  No Known Allergies  Social History:  reports that she has quit smoking. She has never used smokeless tobacco. She reports that she does not drink alcohol or use illicit drugs.  Family History: Family History  Problem Relation Age of Onset  . Coronary artery disease Mother  MI age 50, died.  . Hypertension Father   . Osteoarthritis Father   . Hypertension Sister   . Hypertension Brother   . Obesity Father     Physical Exam: Filed Vitals:   03/28/11 1922 03/28/11 1926 03/28/11 2122 03/28/11 2251  BP:  109/63 124/61 131/76  Pulse:  86 75 74  Temp:  99.8 F (37.7 C) 98.1 F (36.7 C) 99.3 F (37.4 C)  TempSrc:  Oral Oral Oral  Resp:  20 20 20   Height: 5' (1.524 m)   4\' 11"  (1.499 m)  Weight: 183.253 kg (404 lb)   177.6 kg (391 lb 8.6 oz)  SpO2:  91% 100% 99%   General appearance: alert, cooperative and no distress Head: Normocephalic, without obvious abnormality, atraumatic Eyes:  negative Neck: no adenopathy, no carotid bruit, no JVD and supple, symmetrical, trachea midline Resp: clear to auscultation bilaterally Cardio: regular rate and rhythm, S1, S2 normal, no murmur, click, rub or gallop Extremities: edema mild lle Skin: erythema - lower leg(s) left or  Neurologic: Grossly normal   Labs on Admission:   [facility] 03/28/11 2017  NA 131*  K 4.5  CL 87*  CO2 34*  GLUCOSE 159*  BUN 34*  CREATININE 1.38*  CALCIUM 9.3  MG --  PHOS --   No results found for this basename: AST:2,ALT:2,ALKPHOS:2,BILITOT:2,PROT:2,ALBUMIN:2 in the last 72 hours No results found for this basename: LIPASE:2,AMYLASE:2 in the last 72 hours  Basename 03/28/11 2017  WBC 13.5*  NEUTROABS 11.2*  HGB 10.0*  HCT 32.0*  MCV 87.7  PLT 256   No results found for this basename: CKTOTAL:3,CKMB:3,CKMBINDEX:3,TROPONINI:3 in the last 72 hours No results found for this basename: TSH,T4TOTAL,FREET3,T3FREE,THYROIDAB in the last 72 hours No results found for this basename: VITAMINB12:2,FOLATE:2,FERRITIN:2,TIBC:2,IRON:2,RETICCTPCT:2 in the last 72 hours  Radiological Exams on Admission: No results found.  Assessment/Plan: 55 yo female with lle cellulitis 1.  Cellulitis  Vancomycin 2.  Mild dehydration gentle ivf overnight 3.  H/o chf stable 4.  Chronic resp fail cont home 02 at 3L cont 5.  Morbid obesity-  Encourage to continue dieting and lose weight.  She already lost 15 lbs. 6.  DM  Cont home insulin    Eunie Lawn A 03/29/2011, 12:20 AM  Admitted before midnight

## 2011-03-30 LAB — CBC
HCT: 29.9 % — ABNORMAL LOW (ref 36.0–46.0)
Hemoglobin: 9.4 g/dL — ABNORMAL LOW (ref 12.0–15.0)
MCH: 27.7 pg (ref 26.0–34.0)
MCHC: 31.4 g/dL (ref 30.0–36.0)
MCV: 88.2 fL (ref 78.0–100.0)
Platelets: 258 10*3/uL (ref 150–400)
RBC: 3.39 MIL/uL — ABNORMAL LOW (ref 3.87–5.11)
RDW: 16.4 % — ABNORMAL HIGH (ref 11.5–15.5)
WBC: 10.1 10*3/uL (ref 4.0–10.5)

## 2011-03-30 LAB — COMPREHENSIVE METABOLIC PANEL
Albumin: 3 g/dL — ABNORMAL LOW (ref 3.5–5.2)
Alkaline Phosphatase: 115 U/L (ref 39–117)
BUN: 36 mg/dL — ABNORMAL HIGH (ref 6–23)
Calcium: 9.4 mg/dL (ref 8.4–10.5)
Potassium: 3.7 mEq/L (ref 3.5–5.1)
Total Protein: 7.9 g/dL (ref 6.0–8.3)

## 2011-03-30 LAB — GLUCOSE, CAPILLARY
Glucose-Capillary: 194 mg/dL — ABNORMAL HIGH (ref 70–99)
Glucose-Capillary: 186 mg/dL — ABNORMAL HIGH (ref 70–99)

## 2011-03-30 MED ORDER — INSULIN ASPART PROT & ASPART (70-30 MIX) 100 UNIT/ML ~~LOC~~ SUSP
17.0000 [IU] | Freq: Two times a day (BID) | SUBCUTANEOUS | Status: DC
  Administered 2011-03-30 – 2011-04-02 (×6): 17 [IU] via SUBCUTANEOUS

## 2011-03-30 NOTE — Progress Notes (Signed)
Subjective: The patient has left leg pain and swelling. The analgesics are helping with the knee pain. No complaints of shortness of breath or chest pain.  Objective: Vital signs in last 24 hours: Filed Vitals:   03/29/11 1300 03/29/11 2214 03/30/11 0545 03/30/11 0700  BP: 103/67 136/76 125/62   Pulse: 69 66 59   Temp: 98 F (36.7 C) 97.9 F (36.6 C) 98 F (36.7 C)   TempSrc:  Oral Oral   Resp: 18 20 22    Height:      Weight:    177.266 kg (390 lb 12.8 oz)  SpO2: 97% 97% 97%     Intake/Output Summary (Last 24 hours) at 03/30/11 1022 Last data filed at 03/30/11 0500  Gross per 24 hour  Intake 1927.5 ml  Output    300 ml  Net 1627.5 ml    Weight change: -5.987 kg (-13 lb 3.2 oz)  Exam: Lungs: clear to auscultation bilaterally. Heart: S1, S2, and a 2-3/6 systolic murmur. Abdomen: Morbidly obese, soft, nontender, and nondistended. Large pannus with erythema and intertrigo. Extremities: Diffuse, but less, erythema over the right thigh extending down to the right leg, and just above the right ankle. 1+ nonpitting edema. Mildly tender to palpation. Both knees with mild arthritic changes. Mild tenderness to palpation of the left knee over the patella. No joint effusion, no hot red joints.  Lab Results: Basic Metabolic Panel:  Basename 03/30/11 0502 03/29/11 0442  NA 134* 130*  K 3.7 3.7  CL 91* 87*  CO2 32 33*  GLUCOSE 145* 177*  BUN 36* 36*  CREATININE 1.50* 1.55*  CALCIUM 9.4 9.3  MG -- --  PHOS -- --   Liver Function Tests:  Basename 03/30/11 0502  AST 27  ALT 29  ALKPHOS 115  BILITOT 0.2*  PROT 7.9  ALBUMIN 3.0*   No results found for this basename: LIPASE:2,AMYLASE:2 in the last 72 hours No results found for this basename: AMMONIA:2 in the last 72 hours CBC:  Basename 03/30/11 0502 03/29/11 0442 03/28/11 2017  WBC 10.1 12.4* --  NEUTROABS -- -- 11.2*  HGB 9.4* 9.3* --  HCT 29.9* 29.1* --  MCV 88.2 87.7 --  PLT 258 232 --   Cardiac Enzymes: No  results found for this basename: CKTOTAL:3,CKMB:3,CKMBINDEX:3,TROPONINI:3 in the last 72 hours BNP: No results found for this basename: POCBNP:3 in the last 72 hours D-Dimer: No results found for this basename: DDIMER:2 in the last 72 hours CBG:  Basename 03/30/11 0747 03/29/11 2108 03/29/11 1120 03/29/11 0722  GLUCAP 216* 148* 205* 212*   Hemoglobin A1C: No results found for this basename: HGBA1C in the last 72 hours Fasting Lipid Panel: No results found for this basename: CHOL,HDL,LDLCALC,TRIG,CHOLHDL,LDLDIRECT in the last 72 hours Thyroid Function Tests: No results found for this basename: TSH,T4TOTAL,FREET4,T3FREE,THYROIDAB in the last 72 hours Anemia Panel: No results found for this basename: VITAMINB12,FOLATE,FERRITIN,TIBC,IRON,RETICCTPCT in the last 72 hours Coagulation: No results found for this basename: LABPROT:2,INR:2 in the last 72 hours Urine Drug Screen:  Alcohol Level: No results found for this basename: ETH:2 in the last 72 hours Urinalysis:  Misc. Labs:  Micro: No results found for this or any previous visit (from the past 240 hour(s)).  Studies/Results: No results found.  Medications: I have reviewed the patient's current medications.  Assessment: Active Problems:  DIABETES MELLITUS, TYPE II  OBESITY, MORBID  Anemia, iron deficiency  GERD  OSTEOARTHRITIS  PACEMAKER, PERMANENT  Chronic diastolic heart failure  ARF (acute renal failure)  Cellulitis  of leg  COPD (chronic obstructive pulmonary disease)  Obesity hypoventilation syndrome  Hyponatremia  1. Recurrent right lower extremity cellulitis/mild fungal intertrigo. Improving on Teflaro and nystatin cream.   Acute renal failure. Her creatinine prior to discharge in October 2012 was 1.08. She is on gentle IV fluids. The Lasix dose was decreased to half temporarily.  Hyponatremia. Resolving on IV fluids.  Type 2 diabetes mellitus. Uncontrolled, but CBGs are slightly better. Will increase 70/30  insulin to 17 units twice a day.  Chronic diastolic heart failure. Currently compensated.  Morbid obesity, obesity hypoventilation syndrome, and oxygen-dependent COPD. Stable.  Chronic and deficiency anemia. Will follow.  Osteoarthritis of both knees with chronic arthritic pain. Will start when necessary hydrocodone and when necessary Dilaudid.   Plan: 1. Continue current management, except increase the dose of insulin.   LOS: 2 days   Ahriana Gunkel 03/30/2011, 10:22 AM

## 2011-03-30 NOTE — Progress Notes (Signed)
Pt lives at home with her husband. She is morbidly obese and bedbound. PT has been unable to receive medicare or medicaid benefits. Referral to financial person for assistance.

## 2011-03-31 DIAGNOSIS — R001 Bradycardia, unspecified: Secondary | ICD-10-CM | POA: Diagnosis not present

## 2011-03-31 LAB — CBC
HCT: 30 % — ABNORMAL LOW (ref 36.0–46.0)
Hemoglobin: 9.4 g/dL — ABNORMAL LOW (ref 12.0–15.0)
MCHC: 31.3 g/dL (ref 30.0–36.0)
MCV: 88.8 fL (ref 78.0–100.0)
RDW: 16.8 % — ABNORMAL HIGH (ref 11.5–15.5)

## 2011-03-31 LAB — BASIC METABOLIC PANEL
BUN: 30 mg/dL — ABNORMAL HIGH (ref 6–23)
CO2: 33 mEq/L — ABNORMAL HIGH (ref 19–32)
Chloride: 94 mEq/L — ABNORMAL LOW (ref 96–112)
Creatinine, Ser: 1.36 mg/dL — ABNORMAL HIGH (ref 0.50–1.10)
Glucose, Bld: 166 mg/dL — ABNORMAL HIGH (ref 70–99)
Potassium: 3.8 mEq/L (ref 3.5–5.1)

## 2011-03-31 LAB — GLUCOSE, CAPILLARY
Glucose-Capillary: 153 mg/dL — ABNORMAL HIGH (ref 70–99)
Glucose-Capillary: 189 mg/dL — ABNORMAL HIGH (ref 70–99)
Glucose-Capillary: 139 mg/dL — ABNORMAL HIGH (ref 70–99)

## 2011-03-31 NOTE — Progress Notes (Addendum)
Subjective: No complaints of leg pain, chest pain, or shortness of breath.  Objective: Vital signs in last 24 hours: Filed Vitals:   03/30/11 1336 03/30/11 1812 03/30/11 2023 03/31/11 0509  BP: 119/61  123/56 120/69  Pulse: 66  64 56  Temp: 98.3 F (36.8 C)  98.2 F (36.8 C) 97.6 F (36.4 C)  TempSrc: Oral   Oral  Resp: 20 20 20 18   Height:      Weight:    180.985 kg (399 lb)  SpO2: 96% 97% 97% 98%    Intake/Output Summary (Last 24 hours) at 03/31/11 1122 Last data filed at 03/31/11 0428  Gross per 24 hour  Intake    600 ml  Output    400 ml  Net    200 ml    Weight change: 3.719 kg (8 lb 3.2 oz)  Exam: Lungs: clear to auscultation bilaterally. Heart: S1, S2, and a 2-3/6 systolic murmur and bradycardia. Abdomen: Morbidly obese, soft, nontender, and nondistended. Large pannus with erythema and intertrigo. Extremities: Diffuse, but less, erythema over the right thigh extending down to the right leg, and just above the right ankle. 1+ nonpitting edema. Mildly tender to palpation. Both knees with mild arthritic changes. Mild tenderness to palpation of the left knee over the patella. No joint effusion, no hot red joints.  Lab Results: Basic Metabolic Panel:  Basename 03/31/11 0618 03/30/11 0502  NA 135 134*  K 3.8 3.7  CL 94* 91*  CO2 33* 32  GLUCOSE 166* 145*  BUN 30* 36*  CREATININE 1.36* 1.50*  CALCIUM 9.6 9.4  MG -- --  PHOS -- --   Liver Function Tests:  Basename 03/30/11 0502  AST 27  ALT 29  ALKPHOS 115  BILITOT 0.2*  PROT 7.9  ALBUMIN 3.0*   No results found for this basename: LIPASE:2,AMYLASE:2 in the last 72 hours No results found for this basename: AMMONIA:2 in the last 72 hours CBC:  Basename 03/31/11 0618 03/30/11 0502 03/28/11 2017  WBC 8.9 10.1 --  NEUTROABS -- -- 11.2*  HGB 9.4* 9.4* --  HCT 30.0* 29.9* --  MCV 88.8 88.2 --  PLT 304 258 --   Cardiac Enzymes: No results found for this basename:  CKTOTAL:3,CKMB:3,CKMBINDEX:3,TROPONINI:3 in the last 72 hours BNP: No results found for this basename: POCBNP:3 in the last 72 hours D-Dimer: No results found for this basename: DDIMER:2 in the last 72 hours CBG:  Basename 03/31/11 1105 03/31/11 0736 03/30/11 2027 03/30/11 1635 03/30/11 1102 03/30/11 0747  GLUCAP 189* 153* 160* 176* 186* 216*   Hemoglobin A1C: No results found for this basename: HGBA1C in the last 72 hours Fasting Lipid Panel: No results found for this basename: CHOL,HDL,LDLCALC,TRIG,CHOLHDL,LDLDIRECT in the last 72 hours Thyroid Function Tests: No results found for this basename: TSH,T4TOTAL,FREET4,T3FREE,THYROIDAB in the last 72 hours Anemia Panel: No results found for this basename: VITAMINB12,FOLATE,FERRITIN,TIBC,IRON,RETICCTPCT in the last 72 hours Coagulation: No results found for this basename: LABPROT:2,INR:2 in the last 72 hours Urine Drug Screen:  Alcohol Level: No results found for this basename: ETH:2 in the last 72 hours Urinalysis:  Misc. Labs:  Micro: No results found for this or any previous visit (from the past 240 hour(s)).  Studies/Results: No results found.  Medications: I have reviewed the patient's current medications.  Assessment: Active Problems:  DIABETES MELLITUS, TYPE II  OBESITY, MORBID  Anemia, iron deficiency  GERD  OSTEOARTHRITIS  PACEMAKER, PERMANENT  Chronic diastolic heart failure  ARF (acute renal failure)  Cellulitis of leg  COPD (chronic obstructive pulmonary disease)  Obesity hypoventilation syndrome  Hyponatremia  Bradycardia  1. Recurrent right lower extremity cellulitis/mild fungal intertrigo. Improving on Teflaro and nystatin cream.   Acute renal failure. Her creatinine prior to discharge in October 2012 was 1.08. She is status post gentle IV fluids. The Lasix dose was decreased to half temporarily. Her creatinine has improved, but not at baseline.  Hyponatremia. Resolved, status post IV  fluids..  Type 2 diabetes mellitus. Uncontrolled, but CBGs are slightly better on an increase in 70/30 insulin.  History of heart block, status post pacemaker. Her heart rate has been falling below 60 beats per minute. She has not had her pacemaker interrogated in over a year. We will try to get this done while she is hospitalized. An EKG will also be ordered today. Of note, her TSH was within normal limits at 1.99 in October 2012.  Chronic diastolic heart failure. Currently compensated.  Hypertension. Controlled.  Morbid obesity, obesity hypoventilation syndrome, and oxygen-dependent COPD. Stable.  Chronic iron deficiency anemia. Will follow.  Osteoarthritis of both knees with chronic arthritic pain. Will start when necessary hydrocodone and when necessary Dilaudid.   Plan: Will try to get the patient's pacemaker interrogated during this hospitalization. We'll order an EKG today for evaluation. We'll check her electrolytes and magnesium level in the morning.   LOS: 3 days   [name] 03/31/2011, 11:22 AM

## 2011-04-01 ENCOUNTER — Encounter (HOSPITAL_COMMUNITY): Payer: Self-pay | Admitting: Internal Medicine

## 2011-04-01 LAB — CBC
HCT: 29.3 % — ABNORMAL LOW (ref 36.0–46.0)
MCHC: 31.4 g/dL (ref 30.0–36.0)
MCV: 88.8 fL (ref 78.0–100.0)
Platelets: 315 10*3/uL (ref 150–400)
RDW: 17 % — ABNORMAL HIGH (ref 11.5–15.5)
WBC: 9.2 10*3/uL (ref 4.0–10.5)

## 2011-04-01 LAB — GLUCOSE, CAPILLARY
Glucose-Capillary: 142 mg/dL — ABNORMAL HIGH (ref 70–99)
Glucose-Capillary: 146 mg/dL — ABNORMAL HIGH (ref 70–99)

## 2011-04-01 LAB — BASIC METABOLIC PANEL
BUN: 26 mg/dL — ABNORMAL HIGH (ref 6–23)
Calcium: 9.9 mg/dL (ref 8.4–10.5)
GFR calc Af Amer: 55 mL/min — ABNORMAL LOW (ref >90)
GFR calc non Af Amer: 48 mL/min — ABNORMAL LOW (ref >90)
Glucose, Bld: 154 mg/dL — ABNORMAL HIGH (ref 70–99)
Potassium: 4.2 mEq/L (ref 3.5–5.1)

## 2011-04-01 MED ORDER — LEVALBUTEROL HCL 0.63 MG/3ML IN NEBU
0.6300 mg | INHALATION_SOLUTION | Freq: Three times a day (TID) | RESPIRATORY_TRACT | Status: DC
  Administered 2011-04-01 – 2011-04-02 (×4): 0.63 mg via RESPIRATORY_TRACT
  Filled 2011-04-01 (×4): qty 3

## 2011-04-01 NOTE — Progress Notes (Signed)
Subjective: No complaints of leg pain, chest pain, or shortness of breath, but she does have a cough.  Objective: Vital signs in last 24 hours: Filed Vitals:   03/31/11 1640 03/31/11 1959 03/31/11 2144 04/01/11 0531  BP: 120/70  135/70 141/76  Pulse:   75 65  Temp:   97.8 F (36.6 C) 98.2 F (36.8 C)  TempSrc:   Oral Oral  Resp:   18 18  Height:      Weight:    180.3 kg (397 lb 7.8 oz)  SpO2:  97% 99% 96%    Intake/Output Summary (Last 24 hours) at 04/01/11 1035 Last data filed at 04/01/11 0700  Gross per 24 hour  Intake    480 ml  Output      0 ml  Net    480 ml    Weight change: -0.685 kg (-1 lb 8.2 oz)  Exam: Lungs: clear to auscultation bilaterally, but globally decreased breath sounds. Heart: S1, S2, and a 2-3/6 systolic murmur and bradycardia. Abdomen: Morbidly obese, soft, nontender, and nondistended. Large pannus with erythema and intertrigo. Extremities: Less, erythema over the right thigh extending down to the right leg, and just above the right ankle. 1+ nonpitting edema. Mildly tender to palpation. Both knees with mild arthritic changes. Mild tenderness to palpation of the left knee over the patella. No joint effusion, no hot red joints.  Lab Results: Basic Metabolic Panel:  Basename 04/01/11 0603 03/31/11 0618  NA 135 135  K 4.2 3.8  CL 97 94*  CO2 30 33*  GLUCOSE 154* 166*  BUN 26* 30*  CREATININE 1.25* 1.36*  CALCIUM 9.9 9.6  MG 2.0 --  PHOS -- --   Liver Function Tests:  Basename 03/30/11 0502  AST 27  ALT 29  ALKPHOS 115  BILITOT 0.2*  PROT 7.9  ALBUMIN 3.0*   No results found for this basename: LIPASE:2,AMYLASE:2 in the last 72 hours No results found for this basename: AMMONIA:2 in the last 72 hours CBC:  Basename 04/01/11 0603 03/31/11 0618  WBC 9.2 8.9  NEUTROABS -- --  HGB 9.2* 9.4*  HCT 29.3* 30.0*  MCV 88.8 88.8  PLT 315 304   Cardiac Enzymes: No results found for this basename: CKTOTAL:3,CKMB:3,CKMBINDEX:3,TROPONINI:3 in  the last 72 hours BNP: No results found for this basename: POCBNP:3 in the last 72 hours D-Dimer: No results found for this basename: DDIMER:2 in the last 72 hours CBG:  Basename 04/01/11 0743 03/31/11 2102 03/31/11 1647 03/31/11 1105 03/31/11 0736 03/30/11 2027  GLUCAP 159* 138* 139* 189* 153* 160*   Hemoglobin A1C: No results found for this basename: HGBA1C in the last 72 hours Fasting Lipid Panel: No results found for this basename: CHOL,HDL,LDLCALC,TRIG,CHOLHDL,LDLDIRECT in the last 72 hours Thyroid Function Tests: No results found for this basename: TSH,T4TOTAL,FREET4,T3FREE,THYROIDAB in the last 72 hours Anemia Panel: No results found for this basename: VITAMINB12,FOLATE,FERRITIN,TIBC,IRON,RETICCTPCT in the last 72 hours Coagulation: No results found for this basename: LABPROT:2,INR:2 in the last 72 hours Urine Drug Screen:  Alcohol Level: No results found for this basename: ETH:2 in the last 72 hours Urinalysis:  Misc. Labs:  Micro: No results found for this or any previous visit (from the past 240 hour(s)).  Studies/Results: No results found.  Medications: I have reviewed the patient's current medications.  Assessment: Active Problems:  DIABETES MELLITUS, TYPE II  OBESITY, MORBID  Anemia, iron deficiency  GERD  OSTEOARTHRITIS  PACEMAKER, PERMANENT  Chronic diastolic heart failure  ARF (acute renal failure)  Cellulitis  of leg  COPD (chronic obstructive pulmonary disease)  Obesity hypoventilation syndrome  Hyponatremia  Bradycardia  1. Recurrent right lower extremity cellulitis/mild fungal intertrigo. Improving on Teflaro and nystatin cream.   Acute renal failure. Her creatinine prior to discharge in October 2012 was 1.08. She is status post gentle IV fluids. The Lasix dose was decreased to half temporarily. Her creatinine has improved, but not at baseline.  Hyponatremia. Resolved, status post IV fluids..  Type 2 diabetes mellitus. Uncontrolled, but  CBGs are slightly better on an increase in 70/30 insulin.  History of tachybradycardia syndrome, status post pacemaker. Her heart rate has been falling below 60 beats per minute. Her EKG on 03/31/2011 reveals AP dual paced rhythm with prolonged AV conduction and 60 beats per minute. She has not had her pacemaker interrogated in over a year. We will try to get this done while she is hospitalized. An EKG will also be ordered today. Of note, her TSH was within normal limits at 1.99 in October 2012. Her magnesium level is normal today.  Chronic diastolic heart failure. Currently compensated.  Hypertension. Controlled.  Morbid obesity, obesity hypoventilation syndrome, and oxygen-dependent COPD. she has developed a cough, and we'll therefore, start Xopenex nebulizers.  Chronic iron deficiency anemia. Will follow.  Osteoarthritis of both knees with chronic arthritic pain. Will start when necessary hydrocodone and when necessary Dilaudid.   Plan: Will try to get the patient's pacemaker interrogated during this hospitalization, before discharge tomorrow. We'll start Xopenex nebulizations.   LOS: 4 days   Danielly Ackerley 04/01/2011, 10:35 AM

## 2011-04-02 ENCOUNTER — Encounter: Payer: Self-pay | Admitting: Internal Medicine

## 2011-04-02 ENCOUNTER — Encounter (HOSPITAL_COMMUNITY): Payer: Self-pay | Admitting: Internal Medicine

## 2011-04-02 ENCOUNTER — Ambulatory Visit (INDEPENDENT_AMBULATORY_CARE_PROVIDER_SITE_OTHER): Payer: Self-pay | Admitting: *Deleted

## 2011-04-02 DIAGNOSIS — K5900 Constipation, unspecified: Secondary | ICD-10-CM | POA: Diagnosis present

## 2011-04-02 DIAGNOSIS — I442 Atrioventricular block, complete: Secondary | ICD-10-CM

## 2011-04-02 LAB — GLUCOSE, CAPILLARY: Glucose-Capillary: 155 mg/dL — ABNORMAL HIGH (ref 70–99)

## 2011-04-02 LAB — PACEMAKER DEVICE OBSERVATION
AL AMPLITUDE: 4 mv
AL THRESHOLD: 1 V
RV LEAD IMPEDENCE PM: 1077 Ohm
RV LEAD THRESHOLD: 0.75 V
VENTRICULAR PACING PM: 98

## 2011-04-02 MED ORDER — NYSTATIN 100000 UNIT/GM EX CREA: TOPICAL_CREAM | CUTANEOUS | Status: DC

## 2011-04-02 MED ORDER — HYDROCODONE-ACETAMINOPHEN 5-325 MG PO TABS: 1.0000 | ORAL_TABLET | ORAL | Status: AC | PRN

## 2011-04-02 MED ORDER — TRIPLE ANTIBIOTIC 5-400-5000 EX OINT: TOPICAL_OINTMENT | CUTANEOUS | Status: DC

## 2011-04-02 MED ORDER — INSULIN LISPRO PROT & LISPRO (75-25 MIX) 100 UNIT/ML ~~LOC~~ SUSP: SUBCUTANEOUS | Status: DC

## 2011-04-02 MED ORDER — CEPHALEXIN 500 MG PO CAPS: 500.0000 mg | ORAL_CAPSULE | Freq: Four times a day (QID) | ORAL | Status: AC

## 2011-04-02 MED ORDER — FUROSEMIDE 40 MG PO TABS: ORAL_TABLET | ORAL | Status: DC

## 2011-04-02 NOTE — Progress Notes (Signed)
Pt d/c home today. Medication assistance given for insulin and keflex.

## 2011-04-02 NOTE — Discharge Summary (Signed)
Physician Discharge Summary  [name] MRN: [id number] DOB/AGE: 55-May-1957 55 y.o.  PCP: No primary provider on file.   Admit date: 03/28/2011 Discharge date: 04/02/2011  Discharge Diagnoses:  1. Recurrent cellulitis of the right lower extremity. 2. Mild intertrigo. 3. History of tachybradycardia syndrome, status post pacemaker insertion in 2008. The patient's pacemaker was interrogated during the hospitalization and was apparently working effectively. 4. Occasional bradycardia with her heart rate falling below 60 beats per minute. 5. Hyponatremia and acute renal failure, secondary to volume depletion. The dose of her Lasix was decreased from 40 mg twice a day to 20 mg twice a day. Her serum sodium was 135 and her serum creatinine was 1.25 prior to discharge. 6. Chronic diastolic dysfunction, compensated. 7. Oxygen-dependent COPD/obesity hypoventilation syndrome/pickwickian syndrome 8. Type 2 diabetes mellitus, with fair to poor control chronically. 9. Chronic bilateral knee pain secondary to osteoarthritis. 10. Hypertension. 11. Constipation. 12. Iron deficiency anemia. Her hemoglobin was at baseline, ranging from 9.0-9.5. 13. Bedridden state, secondary to morbid obesity.    Current Discharge Medication List    START taking these medications   Details  cephALEXin (KEFLEX) 500 MG capsule Take 1 capsule (500 mg total) by mouth 4 (four) times daily. Qty: 40 capsule, Refills: 0    HYDROcodone-acetaminophen (NORCO) 5-325 MG per tablet Take 1 tablet by mouth every 4 (four) hours as needed for pain. Qty: 30 tablet, Refills: 0    neomycin-bacitracin-polymyxin (NEOSPORIN) [phone] ointment APPLY TO YOUR LEGS ON EVERY Tuesday, Thursday, AND Saturday. Qty: 28.3 g, Refills: 0    nystatin (MYCOSTATIN) cream APPLY TO YOUR SKIN FOLDS AND LEGS EVERY Monday, Wednesday , AND Friday. Qty: 30 g, Refills: 1      CONTINUE these medications which have CHANGED   Details    furosemide (LASIX) 40 MG tablet THE DOSE WAS DECREASED TO 1/2 TABLET 2 TIMES DAILY.    insulin lispro protamine-insulin lispro (HUMALOG 75/25) (75-25) 100 UNIT/ML SUSP INJECT 22 UNITS TWO TIMES DAILY. Qty: 10 mL  Oxygen, 2 L per minute.     CONTINUE these medications which have NOT CHANGED   Details  aspirin EC 81 MG EC tablet Take 1 tablet (81 mg total) by mouth daily.    cloNIDine (CATAPRES) 0.2 MG tablet Take 0.2 mg by mouth 2 (two) times daily.      ferrous sulfate 325 (65 FE) MG tablet Take 325 mg by mouth daily.      flecainide (TAMBOCOR) 100 MG tablet Take 1 tablet (100 mg total) by mouth 2 (two) times daily. Qty: 60 tablet, Refills: 12    glipiZIDE (GLUCOTROL) 5 MG tablet Take 5 mg by mouth 2 (two) times daily before a meal.      hydrALAZINE (APRESOLINE) 100 MG tablet Take 100 mg by mouth 3 (three) times daily.      labetalol (NORMODYNE) 300 MG tablet Take 300 mg by mouth 2 (two) times daily.      Multiple Vitamins-Minerals (MULTIVITAMINS THER. W/MINERALS) TABS Take 1 tablet by mouth daily.      spironolactone (ALDACTONE) 25 MG tablet Take 25 mg by mouth daily.        STOP taking these medications     naproxen sodium (ANAPROX) 220 MG tablet         Discharge Condition: Improved and stable.  Disposition: Home-Health Care Svc   Consults: Pacemaker interrogater her from [facility].   Significant Diagnostic Studies: Pacemaker interrogation on 04/02/2011.  Microbiology: No results found for this or any  previous visit (from the past 240 hour(s)).   Labs: Results for orders placed during the hospital encounter of 03/28/11 (from the past 48 hour(s))  GLUCOSE, CAPILLARY     Status: Abnormal   Collection Time   03/31/11  4:47 PM      Component Value Range Comment   Glucose-Capillary 139 (*) 70 - 99 (mg/dL)    Comment 1 Notify RN      Comment 2 Documented in Chart     GLUCOSE, CAPILLARY     Status: Abnormal   Collection Time   03/31/11  9:02 PM       Component Value Range Comment   Glucose-Capillary 138 (*) 70 - 99 (mg/dL)    Comment 1 Documented in Chart      Comment 2 Notify RN     CBC     Status: Abnormal   Collection Time   04/01/11  6:03 AM      Component Value Range Comment   WBC 9.2  4.0 - 10.5 (K/uL)    RBC 3.30 (*) 3.87 - 5.11 (MIL/uL)    Hemoglobin 9.2 (*) 12.0 - 15.0 (g/dL)    HCT 86.5 (*) 78.4 - 46.0 (%)    MCV 88.8  78.0 - 100.0 (fL)    MCH 27.9  26.0 - 34.0 (pg)    MCHC 31.4  30.0 - 36.0 (g/dL)    RDW 69.6 (*) 29.5 - 15.5 (%)    Platelets 315  150 - 400 (K/uL)   MAGNESIUM     Status: Normal   Collection Time   04/01/11  6:03 AM      Component Value Range Comment   Magnesium 2.0  1.5 - 2.5 (mg/dL)   BASIC METABOLIC PANEL     Status: Abnormal   Collection Time   04/01/11  6:03 AM      Component Value Range Comment   Sodium 135  135 - 145 (mEq/L)    Potassium 4.2  3.5 - 5.1 (mEq/L)    Chloride 97  96 - 112 (mEq/L)    CO2 30  19 - 32 (mEq/L)    Glucose, Bld 154 (*) 70 - 99 (mg/dL)    BUN 26 (*) 6 - 23 (mg/dL)    Creatinine, Ser 2.84 (*) 0.50 - 1.10 (mg/dL)    Calcium 9.9  8.4 - 10.5 (mg/dL)    GFR calc non Af Amer 48 (*) >90 (mL/min)    GFR calc Af Amer 55 (*) >90 (mL/min)   GLUCOSE, CAPILLARY     Status: Abnormal   Collection Time   04/01/11  7:43 AM      Component Value Range Comment   Glucose-Capillary 159 (*) 70 - 99 (mg/dL)    Comment 1 Notify RN     GLUCOSE, CAPILLARY     Status: Abnormal   Collection Time   04/01/11 11:24 AM      Component Value Range Comment   Glucose-Capillary 142 (*) 70 - 99 (mg/dL)    Comment 1 Notify RN      Comment 2 Documented in Chart     GLUCOSE, CAPILLARY     Status: Abnormal   Collection Time   04/01/11  4:06 PM      Component Value Range Comment   Glucose-Capillary 146 (*) 70 - 99 (mg/dL)    Comment 1 Notify RN      Comment 2 Documented in Chart     GLUCOSE, CAPILLARY     Status: Abnormal   Collection  Time   04/01/11  8:52 PM      Component Value Range  Comment   Glucose-Capillary 164 (*) 70 - 99 (mg/dL)    Comment 1 Notify RN      Comment 2 Documented in Chart     GLUCOSE, CAPILLARY     Status: Abnormal   Collection Time   04/02/11  7:20 AM      Component Value Range Comment   Glucose-Capillary 155 (*) 70 - 99 (mg/dL)    Comment 1 Notify RN      Comment 2 Documented in Chart     GLUCOSE, CAPILLARY     Status: Abnormal   Collection Time   04/02/11 11:15 AM      Component Value Range Comment   Glucose-Capillary 136 (*) 70 - 99 (mg/dL)    Comment 1 Notify RN      Comment 2 Documented in Chart        HPI : The patient is a 55 year old woman with a past medical history significant for status post pacemaker for tachybradycardia syndrome, morbid obesity, bedridden state, chronic diastolic congestive heart failure, oxygen-dependent COPD and hypoventilation syndrome, and diabetes mellitus, who presented to the emergency department on 03/29/2011 with a chief complaint of leg redness and pain. She was hospitalized approximately 4 weeks ago for the same. In the emergency department, she was afebrile and hemodynamically stable, although, her blood pressure was on the lower end of normal. Her lab data were significant for a serum sodium of 131, glucose of 159, creatinine of 1.38, WBC of 13.5, and hemoglobin of 10.0. She was admitted for further evaluation and management.  HOSPITAL COURSE: The patient was started empirically on intravenous vancomycin for right lower extremity cellulitis. Topical nystatin was added empirically for chronic intertrigo and superimposed yeast/fungal colonization from her morbid obesity. Gentle IV fluids were started as it appeared that she was volume depleted. The dose of Lasix was decreased to 20 mg twice a day which was half of her normal home dose. She was maintained on spironolactone and all of her other cardiac medications, unchanged. Chronic oxygen therapy was continued.  Because of her recurrent cellulitis, vancomycin  was discontinued in favor of Teflaro. Analgesics were given for bilateral knee pain from osteoarthritis. Her legs were ordered to be elevated to help decrease the edema and discomfort.  Glipizide and 70/30 insulin were continued for diabetes management. Sliding scale NovoLog was added during this hospitalization before each meal and at bedtime. For better control, the dose of 70/30 insulin was increased to 17 units twice a day. Upon discharge, she was advised to increase the dose to 22 units twice a day. Sliding scale NovoLog was discontinued.    She remained hemodynamically stable and afebrile. The extent of erythema and edema of her left leg subsided, but did not completely resolve. Her white blood cell count normalized. She remained afebrile. Her heart rate did fall below 60 beats per minute a few times. This prompted an order for an EKG. The EKG revealed a paced rhythm with AV conduction delay and 60 beats per minute. A consultation was requested with the pacemaker interrogator. She interrogated the patient's pacemaker prior to discharge, and apparently, it was functioning properly.  The patient's renal function improved with a creatinine of 1.25 prior to discharge.  The patient was advised to keep her pannus and lower extremities cleaned. Because of her recurrent cellulitis, she was instructed to use topical nystatin alternating with topical antibiotic ointment to try to decrease  the fungal and bacterial burden. She received IV antibiotics for 4-1/2 days. She was discharged to home on 10 more days of Keflex.    Discharge Exam: Blood pressure 148/77, pulse 64, temperature 98.5 F (36.9 C), temperature source Oral, resp. rate 20, height 4\' 11"  (1.499 m), weight 180.668 kg (398 lb 4.8 oz), SpO2 98.00%. Lungs: Clear anteriorly. Heart: S1, S2, with a soft systolic murmur. Abdomen: Morbidly obese, positive bowel sounds, nontender, nondistended. Extremities: Scant right lower extremity erythema,  trace to 1+ nonpitting edema bilaterally. Nontender.    Discharge Orders    Future Appointments: Provider: Department: Dept Phone: Center:   07/05/2011 9:00 AM Joice Lofts Caryl Bis, RN [facility] [phone] [id number]     Future Orders Please Complete By Expires   Diet - low sodium heart healthy      Diet Carb Modified      Increase activity slowly      Discharge instructions      Comments:   AS SOON AS YOU ARE ARE ABLE TO SEE YOUR DOCTORS, FOLLOW UP WITH DR. HILL AND DR. Dietrich Pates.        Signed: Manolo Bosket 04/02/2011, 1:24 PM

## 2011-04-02 NOTE — Progress Notes (Signed)
UR Chart Review Completed  

## 2011-04-02 NOTE — Progress Notes (Signed)
CSW spoke with pt to set up ambulance transfer home.  Pt verified address and that family would be present upon arrival.  CSW also discussed initiating CAP application.  CSW attempted to call DSS to ask questions about completing FL2 in hospital, but office was closed due to holiday.  Pt provided with DSS contact information and her PCP's fax number in order for DSS to fax FL2 form to Dr. Adaline Sill office.  Dr. Adaline Sill nurse notified and will complete.  No further concerns per pt.  CSW to sign off.  Karn Cassis

## 2011-04-02 NOTE — Progress Notes (Signed)
PPM check during in patient stay @ [facility].

## 2011-04-06 ENCOUNTER — Other Ambulatory Visit: Payer: Self-pay | Admitting: *Deleted

## 2011-04-06 MED ORDER — LABETALOL HCL 300 MG PO TABS: 300.0000 mg | ORAL_TABLET | Freq: Two times a day (BID) | ORAL | Status: DC

## 2011-04-06 MED ORDER — FUROSEMIDE 40 MG PO TABS: ORAL_TABLET | ORAL | Status: DC

## 2011-04-06 MED ORDER — CLONIDINE HCL 0.2 MG PO TABS: 0.2000 mg | ORAL_TABLET | Freq: Two times a day (BID) | ORAL | Status: DC

## 2011-04-06 MED ORDER — HYDRALAZINE HCL 100 MG PO TABS: 100.0000 mg | ORAL_TABLET | Freq: Three times a day (TID) | ORAL | Status: DC

## 2011-04-06 MED ORDER — SPIRONOLACTONE 25 MG PO TABS: 25.0000 mg | ORAL_TABLET | Freq: Every day | ORAL | Status: DC

## 2011-04-10 ENCOUNTER — Telehealth: Payer: Self-pay | Admitting: Cardiology

## 2011-04-10 NOTE — Telephone Encounter (Signed)
Patient's sister would like to speak with nurse regarding prescriptions that were called in to pharmacy. / tg

## 2011-04-10 NOTE — Telephone Encounter (Signed)
Per [name]'s sister, Ms [name] no longer has insurance, as her husband is now unemployed.  She applied for Medicaid while in hospital and is awaiting coverage.  She was able to fill all medications at a reasonable cost with the exception of her Fleccanide and Labetalol, which are $150 per prescription for cash cost and she cannot afford at this time.

## 2011-04-12 NOTE — Telephone Encounter (Signed)
There is no substitution for Flecainide.  Try Indigent [facility] Program.  Labetalol cost is $39/month.  The price she is quoting is for 3 months and is somewhat inflated.  Can substitute carvedilol 25 mg BID at $10/3 months from [facility].

## 2011-04-13 ENCOUNTER — Telehealth: Payer: Self-pay | Admitting: *Deleted

## 2011-04-13 ENCOUNTER — Other Ambulatory Visit: Payer: Self-pay | Admitting: *Deleted

## 2011-04-13 MED ORDER — CARVEDILOL 25 MG PO TABS: 25.0000 mg | ORAL_TABLET | Freq: Two times a day (BID) | ORAL | Status: DC

## 2011-04-13 NOTE — Telephone Encounter (Signed)
Spoke to Ms. Harle Stanford (sister) regarding medication questions.  Will find a program for flecainide and change coreg to 25 mg bid to substitute for labetalol, as directed by Dr Dietrich Pates.

## 2011-07-05 ENCOUNTER — Encounter: Payer: Self-pay | Admitting: *Deleted

## 2011-07-09 ENCOUNTER — Encounter: Payer: Self-pay | Admitting: *Deleted

## 2011-07-09 ENCOUNTER — Encounter (HOSPITAL_COMMUNITY): Payer: Self-pay | Admitting: *Deleted

## 2011-07-09 ENCOUNTER — Inpatient Hospital Stay (HOSPITAL_COMMUNITY)
Admission: EM | Admit: 2011-07-09 | Discharge: 2011-07-18 | DRG: 208 | Disposition: A | Discharge status: Home Health | Payer: Medicaid Other | Attending: Internal Medicine | Admitting: Internal Medicine

## 2011-07-09 DIAGNOSIS — J9620 Acute and chronic respiratory failure, unspecified whether with hypoxia or hypercapnia: Principal | ICD-10-CM | POA: Diagnosis present

## 2011-07-09 DIAGNOSIS — IMO0002 Reserved for concepts with insufficient information to code with codable children: Secondary | ICD-10-CM | POA: Diagnosis present

## 2011-07-09 DIAGNOSIS — Z9981 Dependence on supplemental oxygen: Secondary | ICD-10-CM

## 2011-07-09 DIAGNOSIS — I442 Atrioventricular block, complete: Secondary | ICD-10-CM

## 2011-07-09 DIAGNOSIS — G4733 Obstructive sleep apnea (adult) (pediatric): Secondary | ICD-10-CM

## 2011-07-09 DIAGNOSIS — I5020 Unspecified systolic (congestive) heart failure: Secondary | ICD-10-CM | POA: Diagnosis present

## 2011-07-09 DIAGNOSIS — I5032 Chronic diastolic (congestive) heart failure: Secondary | ICD-10-CM

## 2011-07-09 DIAGNOSIS — I498 Other specified cardiac arrhythmias: Secondary | ICD-10-CM

## 2011-07-09 DIAGNOSIS — J45909 Unspecified asthma, uncomplicated: Secondary | ICD-10-CM

## 2011-07-09 DIAGNOSIS — E785 Hyperlipidemia, unspecified: Secondary | ICD-10-CM

## 2011-07-09 DIAGNOSIS — M1990 Unspecified osteoarthritis, unspecified site: Secondary | ICD-10-CM

## 2011-07-09 DIAGNOSIS — I5033 Acute on chronic diastolic (congestive) heart failure: Secondary | ICD-10-CM

## 2011-07-09 DIAGNOSIS — M1710 Unilateral primary osteoarthritis, unspecified knee: Secondary | ICD-10-CM

## 2011-07-09 DIAGNOSIS — J189 Pneumonia, unspecified organism: Secondary | ICD-10-CM | POA: Diagnosis present

## 2011-07-09 DIAGNOSIS — Z79899 Other long term (current) drug therapy: Secondary | ICD-10-CM

## 2011-07-09 DIAGNOSIS — J9690 Respiratory failure, unspecified, unspecified whether with hypoxia or hypercapnia: Secondary | ICD-10-CM

## 2011-07-09 DIAGNOSIS — Z794 Long term (current) use of insulin: Secondary | ICD-10-CM

## 2011-07-09 DIAGNOSIS — I2510 Atherosclerotic heart disease of native coronary artery without angina pectoris: Secondary | ICD-10-CM

## 2011-07-09 DIAGNOSIS — M129 Arthropathy, unspecified: Secondary | ICD-10-CM | POA: Diagnosis present

## 2011-07-09 DIAGNOSIS — E118 Type 2 diabetes mellitus with unspecified complications: Secondary | ICD-10-CM | POA: Diagnosis present

## 2011-07-09 DIAGNOSIS — D509 Iron deficiency anemia, unspecified: Secondary | ICD-10-CM

## 2011-07-09 DIAGNOSIS — I10 Essential (primary) hypertension: Secondary | ICD-10-CM

## 2011-07-09 DIAGNOSIS — M179 Osteoarthritis of knee, unspecified: Secondary | ICD-10-CM

## 2011-07-09 DIAGNOSIS — Z8679 Personal history of other diseases of the circulatory system: Secondary | ICD-10-CM

## 2011-07-09 DIAGNOSIS — I509 Heart failure, unspecified: Secondary | ICD-10-CM | POA: Diagnosis present

## 2011-07-09 DIAGNOSIS — N179 Acute kidney failure, unspecified: Secondary | ICD-10-CM

## 2011-07-09 DIAGNOSIS — E119 Type 2 diabetes mellitus without complications: Secondary | ICD-10-CM

## 2011-07-09 DIAGNOSIS — L304 Erythema intertrigo: Secondary | ICD-10-CM

## 2011-07-09 DIAGNOSIS — E662 Morbid (severe) obesity with alveolar hypoventilation: Secondary | ICD-10-CM

## 2011-07-09 DIAGNOSIS — K449 Diaphragmatic hernia without obstruction or gangrene: Secondary | ICD-10-CM | POA: Diagnosis present

## 2011-07-09 DIAGNOSIS — E871 Hypo-osmolality and hyponatremia: Secondary | ICD-10-CM

## 2011-07-09 DIAGNOSIS — M545 Low back pain, unspecified: Secondary | ICD-10-CM

## 2011-07-09 DIAGNOSIS — Z950 Presence of cardiac pacemaker: Secondary | ICD-10-CM

## 2011-07-09 DIAGNOSIS — L03119 Cellulitis of unspecified part of limb: Secondary | ICD-10-CM

## 2011-07-09 DIAGNOSIS — L02419 Cutaneous abscess of limb, unspecified: Secondary | ICD-10-CM

## 2011-07-09 DIAGNOSIS — J449 Chronic obstructive pulmonary disease, unspecified: Secondary | ICD-10-CM

## 2011-07-09 DIAGNOSIS — M25569 Pain in unspecified knee: Secondary | ICD-10-CM

## 2011-07-09 DIAGNOSIS — M069 Rheumatoid arthritis, unspecified: Secondary | ICD-10-CM

## 2011-07-09 DIAGNOSIS — R001 Bradycardia, unspecified: Secondary | ICD-10-CM

## 2011-07-09 DIAGNOSIS — N318 Other neuromuscular dysfunction of bladder: Secondary | ICD-10-CM

## 2011-07-09 DIAGNOSIS — J4489 Other specified chronic obstructive pulmonary disease: Secondary | ICD-10-CM | POA: Diagnosis present

## 2011-07-09 DIAGNOSIS — L03115 Cellulitis of right lower limb: Secondary | ICD-10-CM

## 2011-07-09 DIAGNOSIS — K219 Gastro-esophageal reflux disease without esophagitis: Secondary | ICD-10-CM

## 2011-07-09 DIAGNOSIS — E876 Hypokalemia: Secondary | ICD-10-CM | POA: Diagnosis not present

## 2011-07-09 DIAGNOSIS — R0602 Shortness of breath: Secondary | ICD-10-CM

## 2011-07-09 MED ORDER — HYDROMORPHONE HCL PF 1 MG/ML IJ SOLN
1.0000 mg | Freq: Once | INTRAMUSCULAR | Status: AC
  Administered 2011-07-10: 1 mg via INTRAVENOUS
  Filled 2011-07-09: qty 1

## 2011-07-09 MED ORDER — CEFAZOLIN SODIUM 1 G IJ SOLR
1.0000 g | Freq: Once | INTRAMUSCULAR | Status: DC
  Filled 2011-07-09: qty 10

## 2011-07-09 MED ORDER — ONDANSETRON HCL 4 MG/2ML IJ SOLN
4.0000 mg | Freq: Once | INTRAMUSCULAR | Status: AC
  Administered 2011-07-10: 4 mg via INTRAVENOUS
  Filled 2011-07-09: qty 2

## 2011-07-09 NOTE — ED Notes (Signed)
Pt arrived to er via ems with c/o right leg pain, redness, states that it started three days ago

## 2011-07-09 NOTE — ED Notes (Signed)
Morbidly obese female arrived via EMS with complaint of pain in right lower leg.  Onset approximately three days ago.  States this is similar to an episode she had in October of 2012 when she was hospitalized.

## 2011-07-09 NOTE — ED Provider Notes (Addendum)
History     CSN: [id number]  Arrival date & time 07/09/11  2155   First MD Initiated Contact with Patient 07/09/11 2332      Chief Complaint  Patient presents with  . Leg Pain    (Consider location/radiation/quality/duration/timing/severity/associated sxs/prior treatment) Patient is a 56 y.o. female presenting with leg pain. The history is provided by the patient.  Leg Pain   She has been having pain in her right thigh for the last 3 days and it is getting worse. Pain is moderate to severe and his rated at 9/10 at its worst, 6/10 currently. There is associated redness, warmth, and swelling. She is on low grade fevers up to 100.0 and has had chills and sweats. She is also aching all over. This is similar to when she had cellulitis in that same leg and had debridement to the hospital last October. She's taken over-the-counter painkillers without relief. Symptoms are severe.  Past Medical History  Diagnosis Date  . Morbid obesity   . Diabetes mellitus   . Hypertension   . CHF (congestive heart failure)     Diastolic;   . Pacemaker   . COPD (chronic obstructive pulmonary disease)   . Oxygen dependent   . Pickwickian syndrome   . Chronic back pain   . Coronary artery disease   . Shortness of breath   . Obesity hypoventilation syndrome   . Myocardial infarction   . Arthritis   . Hiatal hernia   . Tachy-brady syndrome     s/p pacemaker 2008  . Cellulitis     Recurrent bilateral LE  . Respiratory failure, acute 12/2009    Sec to diastolic CHF and COPD  . Constipation 04/02/2011    Past Surgical History  Procedure Date  . Abdominal hysterectomy   . Pacemaker insertion     Family History  Problem Relation Age of Onset  . Coronary artery disease Mother     MI age 39, died.  . Hypertension Father   . Osteoarthritis Father   . Hypertension Sister   . Hypertension Brother   . Obesity Father     History  Substance Use Topics  . Smoking status: Former Games developer  .  Smokeless tobacco: Never Used  . Alcohol Use: No    OB History    Grav Para Term Preterm Abortions TAB SAB Ect Mult Living                  Review of Systems  All other systems reviewed and are negative.    Allergies  Review of patient's allergies indicates no known allergies.  Home Medications   Current Outpatient Rx  Name Route Sig Dispense Refill  . ALBUTEROL SULFATE (2.5 MG/3ML) 0.083% IN NEBU Nebulization Take 2.5 mg by nebulization every 6 (six) hours as needed. For shortness of breath    . ASPIRIN 81 MG PO TBEC Oral Take 1 tablet (81 mg total) by mouth daily.    Marland Kitchen CARVEDILOL 25 MG PO TABS Oral Take 1 tablet (25 mg total) by mouth 2 (two) times daily with a meal. 180 tablet 3  . CLONIDINE HCL 0.2 MG PO TABS Oral Take 0.2 mg by mouth 2 (two) times daily. **Takes two tablets at bedtime**    . FERROUS SULFATE 325 (65 FE) MG PO TABS Oral Take 325 mg by mouth daily.      Marland Kitchen FLECAINIDE ACETATE 100 MG PO TABS Oral Take 1 tablet (100 mg total) by mouth 2 (two) times daily.  60 tablet 12    Needs trough level drawn per home health   . FUROSEMIDE 40 MG PO TABS  THE DOSE WAS DECREASED TO 1/2 TABLET 2 TIMES DAILY.    Marland Kitchen GLIPIZIDE 5 MG PO TABS Oral Take 5 mg by mouth 2 (two) times daily before a meal.      . HYDRALAZINE HCL 100 MG PO TABS Oral Take 100 mg by mouth 2 (two) times daily.    . INSULIN LISPRO PROT & LISPRO (75-25) 100 UNIT/ML [place] SUSP Subcutaneous Inject 10-15 Units into the skin 2 (two) times daily.    Carma Leaven M PLUS PO TABS Oral Take 1 tablet by mouth daily.      . TRIPLE ANTIBIOTIC [phone] EX OINT  APPLY TO YOUR LEGS ON EVERY Tuesday, Thursday, AND Saturday. 28.3 g 0  . NYSTATIN 100000 UNIT/GM EX CREA  APPLY TO YOUR SKIN FOLDS AND LEGS EVERY Monday, Wednesday , AND Friday. 30 g 1  . SPIRONOLACTONE 25 MG PO TABS Oral Take 1 tablet (25 mg total) by mouth daily. 90 tablet 3    BP 135/69  Pulse 82  Temp 99 F (37.2 C)  Resp 21  Ht 5' (1.524 m)  SpO2 97%  Physical  Exam  Nursing note and vitals reviewed. 56 year old female is resting comfortably and in no acute distress. Vital signs are significant for mild tachypnea with respiratory rate of 21. Oxygen saturation is 97% which is normal. She is morbidly obese. Head is normocephalic and atraumatic. PERRLA LA, EOMI. Oropharynx is clear.neck is nontender and supple without adenopathy or JVD. Lungs are clear without rales, wheezes, or rhonchi. Heart has regular rate and rhythm without murmur. Abdomen is soft, flat, nontender without masses or hepatosplenomegaly. Extremities there is 2+ pedal edema bilaterally. The right thigh is moderately erythematous, mildly warm, and moderately to markedly swollen when compared with the left. This is consistent with cellulitis. Neurologic: Mental status is normal, cranial nerves are intact, there no focal motor or sensory deficits.  ED Course  INTUBATION Date/Time: 07/10/2011 7:30 AM Performed by: Dione Booze Authorized by: Preston Fleeting, Chason Mciver Consent: Verbal consent obtained. Written consent not obtained. The procedure was performed in an emergent situation. Risks and benefits: risks, benefits and alternatives were discussed Consent given by: spouse Patient understanding: patient states understanding of the procedure being performed Patient consent: the patient's understanding of the procedure matches consent given Procedure consent: procedure consent matches procedure scheduled Relevant documents: relevant documents present and verified Test results: test results available and properly labeled Required items: required blood products, implants, devices, and special equipment available Patient identity confirmed: verbally with patient and arm band Indications: respiratory failure Intubation method: direct Patient status: unconscious Preoxygenation: BVM Pretreatment medications: none Laryngoscope size: Mac 4 Tube size: 7.5 mm Tube type: cuffed Number of attempts: 1 Cricoid  pressure: no Cords visualized: yes Post-procedure assessment: chest rise and CO2 detector Breath sounds: equal Cuff inflated: yes Tube secured with: ETT holder Chest x-ray interpreted by radiologist. Chest x-ray findings: endotracheal tube in appropriate position Patient tolerance: Patient tolerated the procedure well with no immediate complications.   (including critical care time)  Results for orders placed during the hospital encounter of 07/09/11  CBC      Component Value Range   WBC 13.8 (*) 4.0 - 10.5 (K/uL)   RBC 3.41 (*) 3.87 - 5.11 (MIL/uL)   Hemoglobin 9.6 (*) 12.0 - 15.0 (g/dL)   HCT 96.0 (*) 45.4 - 46.0 (%)   MCV 93.0  78.0 -  100.0 (fL)   MCH 28.2  26.0 - 34.0 (pg)   MCHC 30.3  30.0 - 36.0 (g/dL)   RDW 16.1 (*) 09.6 - 15.5 (%)   Platelets 259  150 - 400 (K/uL)  DIFFERENTIAL      Component Value Range   Neutrophils Relative 87 (*) 43 - 77 (%)   Neutro Abs 11.9 (*) 1.7 - 7.7 (K/uL)   Lymphocytes Relative 7 (*) 12 - 46 (%)   Lymphs Abs 0.9  0.7 - 4.0 (K/uL)   Monocytes Relative 4  3 - 12 (%)   Monocytes Absolute 0.7  0.1 - 1.0 (K/uL)   Eosinophils Relative 2  0 - 5 (%)   Eosinophils Absolute 0.2  0.0 - 0.7 (K/uL)   Basophils Relative 0  0 - 1 (%)   Basophils Absolute 0.0  0.0 - 0.1 (K/uL)  BASIC METABOLIC PANEL      Component Value Range   Sodium 139  135 - 145 (mEq/L)   Potassium 4.1  3.5 - 5.1 (mEq/L)   Chloride 90 (*) 96 - 112 (mEq/L)   CO2 41 (*) 19 - 32 (mEq/L)   Glucose, Bld 232 (*) 70 - 99 (mg/dL)   BUN 28 (*) 6 - 23 (mg/dL)   Creatinine, Ser 0.45  0.50 - 1.10 (mg/dL)   Calcium 40.9  8.4 - 10.5 (mg/dL)   GFR calc non Af Amer 66 (*) >90 (mL/min)   GFR calc Af Amer 76 (*) >90 (mL/min)  SEDIMENTATION RATE      Component Value Range   Sed Rate 120 (*) 0 - 22 (mm/hr)   She was given initial dose of Ancef. Case has been discussed with Dr. Rito Ehrlich who is on call for triad hospitalists and arrangements are made to admit the patient.  1. Cellulitis of  right thigh   2. Respiratory failure       MDM  Cellulitis in the right thigh. Given her comorbidity of diabetes and morbid obesity, I anticipate she will need to have treatment started as an inpatient. Old records are reviewed and she was admitted for cellulitis of the right thigh in October 2012.        Dione Booze, MD 07/10/11 0132  Patient is become hypoxic and poorly arousable. Oxygen saturations are running in the mid 80s. This started after she got a dose of Dilaudid, but has not improved with Narcan. She will be placed on BiPAP and observed. She may need to be intubated if respiratory status does not improve.  Dione Booze, MD 07/10/11 0631  On BiPAP, she was able to get her oxygen saturations up to 91%, but remained lethargic and poorly responsive. ABG was obtained which showed severe respiratory acidosis. She will need to be intubated.  CRITICAL CARE Performed by: Dione Booze   Total critical care time: 75 minutes  Critical care time was exclusive of separately billable procedures and treating other patients.  Critical care was necessary to treat or prevent imminent or life-threatening deterioration.  Critical care was time spent personally by me on the following activities: development of treatment plan with patient and/or surrogate as well as nursing, discussions with consultants, evaluation of patient's response to treatment, examination of patient, obtaining history from patient or surrogate, ordering and performing treatments and interventions, ordering and review of laboratory studies, ordering and review of radiographic studies, pulse oximetry and re-evaluation of patient's condition.   Dione Booze, MD 07/10/11 [phone]

## 2011-07-10 ENCOUNTER — Emergency Department (HOSPITAL_COMMUNITY): Payer: Medicaid Other

## 2011-07-10 ENCOUNTER — Encounter (HOSPITAL_COMMUNITY): Payer: Self-pay | Admitting: General Practice

## 2011-07-10 LAB — GLUCOSE, CAPILLARY
Glucose-Capillary: 224 mg/dL — ABNORMAL HIGH (ref 70–99)
Glucose-Capillary: 87 mg/dL (ref 70–99)

## 2011-07-10 LAB — BASIC METABOLIC PANEL
BUN: 28 mg/dL — ABNORMAL HIGH (ref 6–23)
CO2: 41 mEq/L (ref 19–32)
Chloride: 90 mEq/L — ABNORMAL LOW (ref 96–112)
Glucose, Bld: 232 mg/dL — ABNORMAL HIGH (ref 70–99)
Potassium: 4.1 mEq/L (ref 3.5–5.1)

## 2011-07-10 LAB — BLOOD GAS, ARTERIAL
Bicarbonate: 38.9 mEq/L — ABNORMAL HIGH (ref 20.0–24.0)
FIO2: 0.6 %
PEEP: 5 cmH2O
PEEP: 5 cmH2O
Patient temperature: 37
TCO2: 36.9 mmol/L (ref 0–100)
TCO2: 36.1 mmol/L (ref 0–100)
pCO2 arterial: 76 mmHg (ref 35.0–45.0)
pH, Arterial: 7.33 — ABNORMAL LOW (ref 7.350–7.400)
pH, Arterial: 7.434 — ABNORMAL HIGH (ref 7.350–7.400)

## 2011-07-10 LAB — CBC
Hemoglobin: 9.6 g/dL — ABNORMAL LOW (ref 12.0–15.0)
MCH: 28.2 pg (ref 26.0–34.0)
RBC: 3.41 MIL/uL — ABNORMAL LOW (ref 3.87–5.11)

## 2011-07-10 LAB — DIFFERENTIAL
Eosinophils Absolute: 0.2 10*3/uL (ref 0.0–0.7)
Lymphs Abs: 0.9 10*3/uL (ref 0.7–4.0)
Monocytes Relative: 4 % (ref 3–12)
Neutrophils Relative %: 87 % — ABNORMAL HIGH (ref 43–77)

## 2011-07-10 LAB — HEMOGLOBIN A1C: Hgb A1c MFr Bld: 7.3 % — ABNORMAL HIGH (ref <5.7)

## 2011-07-10 LAB — CARDIAC PANEL(CRET KIN+CKTOT+MB+TROPI)
Relative Index: INVALID (ref 0.0–2.5)
Relative Index: INVALID (ref 0.0–2.5)
Total CK: 22 U/L (ref 7–177)
Troponin I: <0.3 ng/mL (ref <0.30)

## 2011-07-10 LAB — MRSA PCR SCREENING: MRSA by PCR: NEGATIVE

## 2011-07-10 MED ORDER — ACETAMINOPHEN 650 MG RE SUPP: 650.0000 mg | Freq: Four times a day (QID) | RECTAL | Status: DC | PRN

## 2011-07-10 MED ORDER — INSULIN ASPART 100 UNIT/ML ~~LOC~~ SOLN: 0.0000 [IU] | Freq: Every day | SUBCUTANEOUS | Status: DC

## 2011-07-10 MED ORDER — FERROUS SULFATE 325 (65 FE) MG PO TABS: 325.0000 mg | ORAL_TABLET | Freq: Every day | ORAL | Status: DC

## 2011-07-10 MED ORDER — CLONIDINE HCL 0.2 MG PO TABS: 0.2000 mg | ORAL_TABLET | Freq: Two times a day (BID) | ORAL | Status: DC

## 2011-07-10 MED ORDER — CEFAZOLIN SODIUM 1-5 GM-% IV SOLN
1.0000 g | Freq: Three times a day (TID) | INTRAVENOUS | Status: DC
  Filled 2011-07-10: qty 50

## 2011-07-10 MED ORDER — PROPOFOL 10 MG/ML IV EMUL
10.0000 ug/kg/min | INTRAVENOUS | Status: DC
  Administered 2011-07-10: 40 ug/kg/min via INTRAVENOUS
  Administered 2011-07-10: 41.482 ug/kg/min via INTRAVENOUS
  Administered 2011-07-10: 40 ug/kg/min via INTRAVENOUS
  Administered 2011-07-10: 30 ug/kg/min via INTRAVENOUS
  Administered 2011-07-10: 40 ug/kg/min via INTRAVENOUS
  Administered 2011-07-10: 10 ug/kg/min via INTRAVENOUS
  Administered 2011-07-11: 45 ug/kg/min via INTRAVENOUS
  Administered 2011-07-11: 41.482 ug/kg/min via INTRAVENOUS
  Filled 2011-07-10 (×8): qty 100

## 2011-07-10 MED ORDER — FENTANYL CITRATE 0.05 MG/ML IJ SOLN: 100.0000 ug | Freq: Once | INTRAMUSCULAR | Status: DC

## 2011-07-10 MED ORDER — NALOXONE HCL 0.4 MG/ML IJ SOLN
INTRAMUSCULAR | Status: AC
  Administered 2011-07-10: 0.4 mg
  Filled 2011-07-10: qty 1

## 2011-07-10 MED ORDER — LEVOFLOXACIN IN D5W 750 MG/150ML IV SOLN
750.0000 mg | INTRAVENOUS | Status: DC
  Administered 2011-07-10: 750 mg via INTRAVENOUS
  Filled 2011-07-10 (×2): qty 150

## 2011-07-10 MED ORDER — DIAZEPAM 5 MG/ML IJ SOLN
INTRAMUSCULAR | Status: AC
  Filled 2011-07-10: qty 2

## 2011-07-10 MED ORDER — INSULIN ASPART 100 UNIT/ML ~~LOC~~ SOLN: 0.0000 [IU] | Freq: Three times a day (TID) | SUBCUTANEOUS | Status: DC

## 2011-07-10 MED ORDER — DOCUSATE SODIUM 100 MG PO CAPS: 100.0000 mg | ORAL_CAPSULE | Freq: Two times a day (BID) | ORAL | Status: DC

## 2011-07-10 MED ORDER — BIOTENE DRY MOUTH MT LIQD
15.0000 mL | Freq: Four times a day (QID) | OROMUCOSAL | Status: DC
  Administered 2011-07-10 – 2011-07-18 (×29): 15 mL via OROMUCOSAL

## 2011-07-10 MED ORDER — CARVEDILOL 12.5 MG PO TABS: 25.0000 mg | ORAL_TABLET | Freq: Two times a day (BID) | ORAL | Status: DC

## 2011-07-10 MED ORDER — SODIUM CHLORIDE 0.9 % IV SOLN
600.0000 mg | Freq: Two times a day (BID) | INTRAVENOUS | Status: DC
  Administered 2011-07-10 – 2011-07-16 (×13): 600 mg via INTRAVENOUS
  Filled 2011-07-10 (×16): qty 600

## 2011-07-10 MED ORDER — INSULIN GLARGINE 100 UNIT/ML ~~LOC~~ SOLN
30.0000 [IU] | Freq: Every day | SUBCUTANEOUS | Status: DC
  Administered 2011-07-10 – 2011-07-18 (×9): 30 [IU] via SUBCUTANEOUS
  Filled 2011-07-10: qty 3

## 2011-07-10 MED ORDER — ENOXAPARIN SODIUM 40 MG/0.4ML ~~LOC~~ SOLN
40.0000 mg | Freq: Every day | SUBCUTANEOUS | Status: DC
  Administered 2011-07-10 – 2011-07-11 (×2): 40 mg via SUBCUTANEOUS
  Filled 2011-07-10: qty 0.4

## 2011-07-10 MED ORDER — ONDANSETRON HCL 4 MG/2ML IJ SOLN
4.0000 mg | Freq: Four times a day (QID) | INTRAMUSCULAR | Status: DC | PRN
  Administered 2011-07-13: 4 mg via INTRAVENOUS
  Filled 2011-07-10: qty 2

## 2011-07-10 MED ORDER — FLECAINIDE ACETATE 100 MG PO TABS
100.0000 mg | ORAL_TABLET | Freq: Two times a day (BID) | ORAL | Status: DC
  Filled 2011-07-10 (×3): qty 1

## 2011-07-10 MED ORDER — OXYCODONE HCL 5 MG PO TABS
5.0000 mg | ORAL_TABLET | ORAL | Status: DC | PRN
  Administered 2011-07-11 – 2011-07-15 (×6): 5 mg via ORAL
  Filled 2011-07-10 (×7): qty 1

## 2011-07-10 MED ORDER — DEXTROSE 5 % IV SOLN
500.0000 mg | INTRAVENOUS | Status: DC
  Administered 2011-07-10 – 2011-07-15 (×6): 500 mg via INTRAVENOUS
  Filled 2011-07-10 (×8): qty 500

## 2011-07-10 MED ORDER — HYDROMORPHONE HCL PF 1 MG/ML IJ SOLN
1.0000 mg | INTRAMUSCULAR | Status: DC | PRN
  Administered 2011-07-10 – 2011-07-13 (×3): 1 mg via INTRAVENOUS
  Filled 2011-07-10 (×3): qty 1

## 2011-07-10 MED ORDER — DIAZEPAM 5 MG/ML IJ SOLN
10.0000 mg | Freq: Once | INTRAMUSCULAR | Status: AC
  Administered 2011-07-10: 10 mg via INTRAVENOUS

## 2011-07-10 MED ORDER — NALOXONE HCL 1 MG/ML IJ SOLN
INTRAMUSCULAR | Status: AC
  Administered 2011-07-10: 2 mg
  Filled 2011-07-10: qty 2

## 2011-07-10 MED ORDER — ROCURONIUM BROMIDE 50 MG/5ML IV SOLN
INTRAVENOUS | Status: AC
  Filled 2011-07-10: qty 2

## 2011-07-10 MED ORDER — INSULIN ASPART PROT & ASPART (70-30 MIX) 100 UNIT/ML ~~LOC~~ SUSP: 15.0000 [IU] | Freq: Two times a day (BID) | SUBCUTANEOUS | Status: DC

## 2011-07-10 MED ORDER — SPIRONOLACTONE 25 MG PO TABS: 25.0000 mg | ORAL_TABLET | Freq: Every day | ORAL | Status: DC

## 2011-07-10 MED ORDER — ACETAMINOPHEN 325 MG PO TABS
650.0000 mg | ORAL_TABLET | Freq: Four times a day (QID) | ORAL | Status: DC | PRN
  Administered 2011-07-11: 650 mg via ORAL
  Filled 2011-07-10: qty 2

## 2011-07-10 MED ORDER — GLIPIZIDE 5 MG PO TABS: 5.0000 mg | ORAL_TABLET | Freq: Two times a day (BID) | ORAL | Status: DC

## 2011-07-10 MED ORDER — PROPOFOL 10 MG/ML IV EMUL
INTRAVENOUS | Status: AC
  Filled 2011-07-10: qty 100

## 2011-07-10 MED ORDER — SODIUM CHLORIDE 0.9 % IJ SOLN
INTRAMUSCULAR | Status: AC
  Filled 2011-07-10: qty 3

## 2011-07-10 MED ORDER — SODIUM CHLORIDE 0.45 % IV SOLN
INTRAVENOUS | Status: AC
  Administered 2011-07-10: 11:00:00 via INTRAVENOUS

## 2011-07-10 MED ORDER — CHLORHEXIDINE GLUCONATE 0.12 % MT SOLN
15.0000 mL | Freq: Two times a day (BID) | OROMUCOSAL | Status: DC
  Administered 2011-07-10 – 2011-07-11 (×2): 15 mL via OROMUCOSAL
  Filled 2011-07-10 (×2): qty 15

## 2011-07-10 MED ORDER — HYDRALAZINE HCL 25 MG PO TABS: 100.0000 mg | ORAL_TABLET | Freq: Two times a day (BID) | ORAL | Status: DC

## 2011-07-10 MED ORDER — ONDANSETRON HCL 4 MG PO TABS
4.0000 mg | ORAL_TABLET | Freq: Four times a day (QID) | ORAL | Status: DC | PRN
  Administered 2011-07-16: 4 mg via ORAL
  Filled 2011-07-10: qty 1

## 2011-07-10 MED ORDER — SUCCINYLCHOLINE CHLORIDE 20 MG/ML IJ SOLN
INTRAMUSCULAR | Status: AC
  Filled 2011-07-10: qty 1

## 2011-07-10 MED ORDER — INSULIN ASPART 100 UNIT/ML ~~LOC~~ SOLN
0.0000 [IU] | SUBCUTANEOUS | Status: DC
  Administered 2011-07-10: 4 [IU] via SUBCUTANEOUS
  Administered 2011-07-11 – 2011-07-12 (×3): 3 [IU] via SUBCUTANEOUS
  Administered 2011-07-12 (×2): 4 [IU] via SUBCUTANEOUS
  Filled 2011-07-10: qty 3

## 2011-07-10 MED ORDER — ALBUTEROL SULFATE (5 MG/ML) 0.5% IN NEBU
2.5000 mg | INHALATION_SOLUTION | Freq: Four times a day (QID) | RESPIRATORY_TRACT | Status: DC | PRN
  Administered 2011-07-12 – 2011-07-15 (×4): 2.5 mg via RESPIRATORY_TRACT
  Filled 2011-07-10 (×4): qty 0.5

## 2011-07-10 MED ORDER — LIDOCAINE HCL (CARDIAC) 20 MG/ML IV SOLN
INTRAVENOUS | Status: AC
  Filled 2011-07-10: qty 5

## 2011-07-10 MED ORDER — CEFAZOLIN SODIUM 1-5 GM-% IV SOLN
1.0000 g | Freq: Once | INTRAVENOUS | Status: AC
  Administered 2011-07-10: 1 g via INTRAVENOUS

## 2011-07-10 MED ORDER — FENTANYL CITRATE 0.05 MG/ML IJ SOLN
INTRAMUSCULAR | Status: AC
  Filled 2011-07-10: qty 2

## 2011-07-10 MED ORDER — PANTOPRAZOLE SODIUM 40 MG IV SOLR
40.0000 mg | INTRAVENOUS | Status: DC
  Administered 2011-07-10 – 2011-07-12 (×3): 40 mg via INTRAVENOUS
  Filled 2011-07-10 (×3): qty 40

## 2011-07-10 MED ORDER — FUROSEMIDE 20 MG PO TABS: 20.0000 mg | ORAL_TABLET | Freq: Two times a day (BID) | ORAL | Status: DC

## 2011-07-10 MED ORDER — ASPIRIN EC 81 MG PO TBEC: 81.0000 mg | DELAYED_RELEASE_TABLET | Freq: Every day | ORAL | Status: DC

## 2011-07-10 MED ORDER — NALOXONE HCL 1 MG/ML IJ SOLN
INTRAMUSCULAR | Status: AC
  Administered 2011-07-10: 2 mg via INTRAVENOUS
  Filled 2011-07-10: qty 2

## 2011-07-10 MED ORDER — ETOMIDATE 2 MG/ML IV SOLN
INTRAVENOUS | Status: AC
  Filled 2011-07-10: qty 20

## 2011-07-10 MED ORDER — HYDRALAZINE HCL 20 MG/ML IJ SOLN: 10.0000 mg | INTRAMUSCULAR | Status: DC | PRN

## 2011-07-10 NOTE — ED Notes (Signed)
Report called to unitGaynelle Adu, RN   Bariatric bed waiting - ready to receive patient

## 2011-07-10 NOTE — Consult Note (Addendum)
Consult requested by: Dr. Karilyn Cota Consult requested for respiratory failure:  HPI: She apparently had come to the emergency room with cellulitis of the leg. She has been treated and she was given some pain medication and seemed to be compensated that point. She has a long known history of respiratory problems and is been on the ventilator on multiple occasions. She has morbid obesity and obesity hypoventilation. She has congestive heart failure which is diastolic in nature. She has COPD. As mentioned she has been intubated on several occasions and placed on mechanical ventilation. She has what appears to be a pneumonia on the left side.  Past Medical History  Diagnosis Date  . Morbid obesity   . Diabetes mellitus   . Hypertension   . CHF (congestive heart failure)     Diastolic;   . Pacemaker   . COPD (chronic obstructive pulmonary disease)   . Oxygen dependent   . Pickwickian syndrome   . Chronic back pain   . Coronary artery disease   . Shortness of breath   . Obesity hypoventilation syndrome   . Myocardial infarction   . Arthritis   . Hiatal hernia   . Tachy-brady syndrome     s/p pacemaker 2008  . Cellulitis     Recurrent bilateral LE  . Respiratory failure, acute 12/2009    Sec to diastolic CHF and COPD  . Constipation 04/02/2011     Family History  Problem Relation Age of Onset  . Coronary artery disease Mother     MI age 75, died.  . Hypertension Father   . Osteoarthritis Father   . Hypertension Sister   . Hypertension Brother   . Obesity Father      History   Social History  . Marital Status: Married    Spouse Name: N/A    Number of Children: N/A  . Years of Education: N/A   Social History Main Topics  . Smoking status: Former Games developer  . Smokeless tobacco: Never Used  . Alcohol Use: No  . Drug Use: No  . Sexually Active: Yes    Birth Control/ Protection: Surgical   Other Topics Concern  . None   Social History Narrative  . None     ROS: Not  obtainable    Objective: Vital signs in last 24 hours: Temp:  [98.6 F (37 C)-100.5 F (38.1 C)] 100.5 F (38.1 C) (02/19 1600) Pulse Rate:  [65-86] 83  (02/19 1700) Resp:  [14-48] 19  (02/19 1700) BP: (99-163)/(50-108) 144/67 mmHg (02/19 1700) SpO2:  [74 %-100 %] 100 % (02/19 1700) FiO2 (%):  [39.7 %-60.2 %] 51.3 % (02/19 1700) Weight:  [180.849 kg (398 lb 11.2 oz)] 180.849 kg (398 lb 11.2 oz) (02/19 0945) Weight change:     Intake/Output from previous day: 02/18 0701 - 02/19 0700 In: -  Out: 200 [Urine:200]  PHYSICAL EXAM She is intubated and sedated. She is morbidly obese. She has what looks like inflammatory changes on her right side. Her pupils do react. Her mucous membranes are slightly dry. Her neck is short and obese but seems supple. Her chest shows rhonchi bilaterally. Her heart is regular. Her abdomen is soft obese without masses bowel sounds are hypoactive. I do not feel any organomegaly. Extremities showed marked edema and some swelling in the thigh on the right. Central nervous system examination shows that she is sedated  Lab Results: Basic Metabolic Panel:  Basename 07/09/11 2346  NA 139  K 4.1  CL 90*  CO2 41*  GLUCOSE 232*  BUN 28*  CREATININE 0.95  CALCIUM 10.0  MG --  PHOS --   Liver Function Tests: No results found for this basename: AST:2,ALT:2,ALKPHOS:2,BILITOT:2,PROT:2,ALBUMIN:2 in the last 72 hours No results found for this basename: LIPASE:2,AMYLASE:2 in the last 72 hours No results found for this basename: AMMONIA:2 in the last 72 hours CBC:  Basename 07/09/11 2346  WBC 13.8*  NEUTROABS 11.9*  HGB 9.6*  HCT 31.7*  MCV 93.0  PLT 259   Cardiac Enzymes:  Basename 07/10/11 1044  CKTOTAL 24  CKMB 1.3  CKMBINDEX --  TROPONINI <0.30   BNP: No results found for this basename: PROBNP:3 in the last 72 hours D-Dimer: No results found for this basename: DDIMER:2 in the last 72 hours CBG:  Basename 07/10/11 1634 07/10/11 1204  07/10/11 0638  GLUCAP 98 167* 224*   Hemoglobin A1C:  Basename 07/10/11 0952  HGBA1C 7.3*   Fasting Lipid Panel: No results found for this basename: CHOL,HDL,LDLCALC,TRIG,CHOLHDL,LDLDIRECT in the last 72 hours Thyroid Function Tests: No results found for this basename: TSH,T4TOTAL,FREET4,T3FREE,THYROIDAB in the last 72 hours Anemia Panel: No results found for this basename: VITAMINB12,FOLATE,FERRITIN,TIBC,IRON,RETICCTPCT in the last 72 hours Coagulation: No results found for this basename: LABPROT:2,INR:2 in the last 72 hours Urine Drug Screen: Drugs of Abuse     Component Value Date/Time   LABOPIA NEGATIVE 02/25/2009 1755   LABOPIA NONE DETECTED 06/05/2007 1610   COCAINSCRNUR NEGATIVE 02/25/2009 1755   COCAINSCRNUR NONE DETECTED 06/05/2007 1610   LABBENZ NEGATIVE 02/25/2009 1755   LABBENZ NONE DETECTED 06/05/2007 1610   AMPHETMU NEGATIVE 02/25/2009 1755   AMPHETMU NONE DETECTED 06/05/2007 1610   THCU NONE DETECTED 06/05/2007 1610   LABBARB  Value: NONE DETECTED        DRUG SCREEN FOR MEDICAL PURPOSES ONLY.  IF CONFIRMATION IS NEEDED FOR ANY PURPOSE, NOTIFY LAB WITHIN 5 DAYS. 06/05/2007 1610    Alcohol Level: No results found for this basename: ETH:2 in the last 72 hours Urinalysis: No results found for this basename: COLORURINE:2,APPERANCEUR:2,LABSPEC:2,PHURINE:2,GLUCOSEU:2,HGBUR:2,BILIRUBINUR:2,KETONESUR:2,PROTEINUR:2,UROBILINOGEN:2,NITRITE:2,LEUKOCYTESUR:2 in the last 72 hours Misc. Labs:   ABGS:  Basename 07/10/11 1030  PHART 7.434*  PO2ART 150.0*  TCO2 36.1  HCO3 38.8*     MICROBIOLOGY: Recent Results (from the past 240 hour(s))  MRSA PCR SCREENING     Status: Normal   Collection Time   07/10/11  9:37 AM      Component Value Range Status Comment   MRSA by PCR NEGATIVE  NEGATIVE  Final     Studies/Results: Dg Chest Portable 1 View  07/10/2011  *RADIOLOGY REPORT*  Clinical Data: Post intubation  PORTABLE CHEST - 1 VIEW  Comparison: Portable chest x-ray of 02/26/2011   Findings: The tip of the endotracheal tube is approximately 2.9 cm above the carina.  Patchy airspace disease is noted bilaterally particularly in the left lung base suspicious for pneumonia. Cardiomegaly is stable.  A permanent pacemaker remains.  IMPRESSION:  1.  Endotracheal tube tip 2.9 cm above carina. 2.  Patchy airspace disease.  Possible pneumonia.  Original Report Authenticated By: Juline Patch, M.D.    Medications:  Prior to Admission:  Prescriptions prior to admission  Medication Sig Dispense Refill  . albuterol (PROVENTIL) (2.5 MG/3ML) 0.083% nebulizer solution Take 2.5 mg by nebulization every 6 (six) hours as needed. For shortness of breath      . aspirin EC 81 MG EC tablet Take 1 tablet (81 mg total) by mouth daily.      . carvedilol (COREG)  25 MG tablet Take 1 tablet (25 mg total) by mouth 2 (two) times daily with a meal.  180 tablet  3  . cloNIDine (CATAPRES) 0.2 MG tablet Take 0.2 mg by mouth 2 (two) times daily. **Takes two tablets at bedtime**      . ferrous sulfate 325 (65 FE) MG tablet Take 325 mg by mouth daily.        . flecainide (TAMBOCOR) 100 MG tablet Take 1 tablet (100 mg total) by mouth 2 (two) times daily.  60 tablet  12  . furosemide (LASIX) 40 MG tablet THE DOSE WAS DECREASED TO 1/2 TABLET 2 TIMES DAILY.      Marland Kitchen glipiZIDE (GLUCOTROL) 5 MG tablet Take 5 mg by mouth 2 (two) times daily before a meal.        . hydrALAZINE (APRESOLINE) 100 MG tablet Take 100 mg by mouth 2 (two) times daily.      . insulin lispro protamine-insulin lispro (HUMALOG 75/25) (75-25) 100 UNIT/ML SUSP Inject 10-15 Units into the skin 2 (two) times daily.      . Multiple Vitamins-Minerals (MULTIVITAMINS THER. W/MINERALS) TABS Take 1 tablet by mouth daily.        Marland Kitchen neomycin-bacitracin-polymyxin (NEOSPORIN) [phone] ointment APPLY TO YOUR LEGS ON EVERY Tuesday, Thursday, AND Saturday.  28.3 g  0  . nystatin (MYCOSTATIN) cream APPLY TO YOUR SKIN FOLDS AND LEGS EVERY Monday, Wednesday , AND  Friday.  30 g  1  . spironolactone (ALDACTONE) 25 MG tablet Take 1 tablet (25 mg total) by mouth daily.  90 tablet  3   Scheduled:   . antiseptic oral rinse  15 mL Mouth Rinse QID  . azithromycin  500 mg Intravenous Q24H  . ceFAZolin  1 g Intravenous Once  . ceFTAROline (TEFLARO) IV  600 mg Intravenous Q12H  . chlorhexidine  15 mL Mouth/Throat BID  . diazepam      . diazepam  10 mg Intravenous Once  . enoxaparin  40 mg Subcutaneous Daily  . fentaNYL      . fentaNYL  100 mcg Intravenous Once  . HYDROmorphone  1 mg Intravenous Once  . insulin aspart  0-20 Units Subcutaneous Q4H  . insulin aspart  0-5 Units Subcutaneous QHS  . insulin aspart  0-5 Units Subcutaneous QHS  . insulin glargine  30 Units Subcutaneous Daily  . naloxone      . naloxone      . naloxone      . ondansetron  4 mg Intravenous Once  . pantoprazole (PROTONIX) IV  40 mg Intravenous Q24H  . propofol      . sodium chloride      . DISCONTD: aspirin EC  81 mg Oral Daily  . DISCONTD: carvedilol  25 mg Oral BID WC  . DISCONTD: ceFAZolin  1 g Intramuscular Once  . DISCONTD:  ceFAZolin (ANCEF) IV  1 g Intravenous Q8H  . DISCONTD: cloNIDine  0.2 mg Oral BID  . DISCONTD: docusate sodium  100 mg Oral BID  . DISCONTD: ferrous sulfate  325 mg Oral Daily  . DISCONTD: flecainide  100 mg Oral BID  . DISCONTD: furosemide  20 mg Oral BID  . DISCONTD: glipiZIDE  5 mg Oral BID AC  . DISCONTD: hydrALAZINE  100 mg Oral BID  . DISCONTD: insulin aspart  0-15 Units Subcutaneous TID WC  . DISCONTD: insulin aspart protamine-insulin aspart  15 Units Subcutaneous BID WC  . DISCONTD: levofloxacin (LEVAQUIN) IV  750 mg Intravenous Q24H  .  DISCONTD: spironolactone  25 mg Oral Daily   Continuous:   . sodium chloride 75 mL/hr at 07/10/11 1200  . propofol 40 mcg/kg/min (07/10/11 1709)   ZOX:[id number], acetaminophen, albuterol, hydrALAZINE, HYDROmorphone, ondansetron (ZOFRAN) IV, ondansetron, oxyCODONE  Assesment: She has respiratory  failure which I think is a combination of multiple factors. She has cellulitis of the leg and is being treated. She has what appears to be pneumonia on chest x-ray as well. She has high ventilatory pressures both because she has a fairly small endotracheal tube and because of her morbid obesity Principal Problem:  *Cellulitis of leg Active Problems:  DIABETES MELLITUS, TYPE II  OBESITY, MORBID  Anemia, iron deficiency  SLEEP APNEA, OBSTRUCTIVE  HYPERTENSION  PACEMAKER, PERMANENT    Plan: I agree with current treatments. She might require some steroids but at this point I would hold off. She will be sedated with Diprivan and. We can see if she can wean  tomorrow.    LOS: 1 day   [name] 07/10/2011, 6:03 PM

## 2011-07-10 NOTE — ED Notes (Signed)
CRITICAL VALUE ALERT  Critical value received:  co2  Date of notification:  07/10/11  Time of notification:  0056  Critical value read back:yes  Nurse who received alert:  Thornton Dales, RN  MD notified (1st page):  glick  Time of first page:  0057  MD notified (2nd page):  Time of second page:  Responding MD:  glick  Time MD responded:  [phone]

## 2011-07-10 NOTE — ED Notes (Signed)
Attempted twice to start IV both on left hand and AC - unsuccessful.  Requested assistance of another RN.

## 2011-07-10 NOTE — Progress Notes (Signed)
Subjective: This lady was admitted with a leg cellulitis but decompensated in the emergency room after receiving some intravenous hydromorphone. Her chest x-ray also is suggestive of a left-sided pneumonia. She is now into bed and mechanically ventilated because of respiratory failure. She is sedated.           Physical Exam: Blood pressure 99/74, pulse 86, temperature 99 F (37.2 C), temperature source Oral, resp. rate 21, height 5' (1.524 m), weight 180.849 kg (398 lb 11.2 oz), SpO2 100.00%. She is morbidly obese. She is sedated. Lung fields anteriorly are clear. Abdomen is soft. Right leg cellulitis as described before.   Investigations:     Basic Metabolic Panel:  Basename 07/09/11 2346  NA 139  K 4.1  CL 90*  CO2 41*  GLUCOSE 232*  BUN 28*  CREATININE 0.95  CALCIUM 10.0  MG --  PHOS --       CBC:  Basename 07/09/11 2346  WBC 13.8*  NEUTROABS 11.9*  HGB 9.6*  HCT 31.7*  MCV 93.0  PLT 259    Dg Chest Portable 1 View  07/10/2011  *RADIOLOGY REPORT*  Clinical Data: Post intubation  PORTABLE CHEST - 1 VIEW  Comparison: Portable chest x-ray of 02/26/2011  Findings: The tip of the endotracheal tube is approximately 2.9 cm above the carina.  Patchy airspace disease is noted bilaterally particularly in the left lung base suspicious for pneumonia. Cardiomegaly is stable.  A permanent pacemaker remains.  IMPRESSION:  1.  Endotracheal tube tip 2.9 cm above carina. 2.  Patchy airspace disease.  Possible pneumonia.  Original Report Authenticated By: Juline Patch, M.D.      Medications: I have reviewed the patient's current medications.  Impression: 1. Right leg cellulitis. 2. Respiratory failure, status post mechanical ventilation. 3. Left sided pneumonia. 4. Morbid obesity. 5 diabetes mellitus. 6. Sleep apnea. 7. Status post permanent pacemaker.     Plan: 1. Continue with the intravenous antibiotics of Levaquin IV. 2. Appreciate input from Dr. Juanetta Gosling  for management of the ventilator. Further recommendations will depend on patient's hospital progress.     LOS: 1 day   Wilson Singer Pager [phone]  07/10/2011, 10:41 AM

## 2011-07-10 NOTE — ED Notes (Signed)
MD at bedside.  Verbal orders for repeat Narcan

## 2011-07-10 NOTE — ED Notes (Addendum)
Responding some now will follow command to sit up - hold your head up.  Still very drowsy.  Will follow commands and open eyes or lift her head.  Monitor sinus rhythm rare PVC.  CBG checked.  02 Sats in mid 80's.  06:25 - 06:30  Dr Preston Fleeting at bedside again.  Respiratory paged again and BiPap requested and a ABG.  Husband in room aware of saituation.

## 2011-07-10 NOTE — ED Notes (Signed)
Assisted on and off the bedpan - voided approx 200 cc in bedpan.  Wet chux removed from under patient, daughter cleaned perineal area and we placed chux between legs temporarily.  Bariatric Bed requested.

## 2011-07-10 NOTE — ED Notes (Addendum)
Sats at 74%  Fingers cool to touch - having periods of sleep apnea.  Diaphoretic.  Pupils pinpoint.  All VS WNL except sats

## 2011-07-10 NOTE — ED Notes (Signed)
0700 Dr Karilyn Cota notified of changes in patient condition - advised he will see her in ICY - send up with BiPap.  Advised awaiting ABG results.

## 2011-07-10 NOTE — H&P (Signed)
[name] is an 56 y.o. female.    PCP: Evlyn Courier, MD, MD   Chief Complaint: Pain in the right leg and thigh  HPI: This is a 56 year old, morbidly obese, African American female, with a past medical history of, diabetes, hypertension, sleep apnea, who was in her usual state of health about 3 days ago, when she started noticing that her right leg was hurting. The pain continued to get worse and became tender to palpation. She had a low-grade fever. Had some chills. And, then her daughter took a look at the right leg. It was found to be red. She's had some nausea without any emesis. She hasn't noticed any drainage from that right leg. She had similar presentation back in October of 2012 and was admitted to the hospital for cellulitis. Currently pain is 6/10 in intensity. It's worse with palpation. Patient is bedbound is and does not walk.   Home Medications: Prior to Admission medications   Medication Sig Start Date End Date Taking? Authorizing Provider  albuterol (PROVENTIL) (2.5 MG/3ML) 0.083% nebulizer solution Take 2.5 mg by nebulization every 6 (six) hours as needed. For shortness of breath   Yes Historical Provider, MD  aspirin EC 81 MG EC tablet Take 1 tablet (81 mg total) by mouth daily. 03/02/11 03/01/12 Yes Elliot Cousin, MD  carvedilol (COREG) 25 MG tablet Take 1 tablet (25 mg total) by mouth 2 (two) times daily with a meal. 04/13/11 04/12/12 Yes Gerrit Friends. Rothbart, MD  cloNIDine (CATAPRES) 0.2 MG tablet Take 0.2 mg by mouth 2 (two) times daily. **Takes two tablets at bedtime** 04/06/11  Yes Gerrit Friends. Rothbart, MD  ferrous sulfate 325 (65 FE) MG tablet Take 325 mg by mouth daily.     Yes Historical Provider, MD  flecainide (TAMBOCOR) 100 MG tablet Take 1 tablet (100 mg total) by mouth 2 (two) times daily. 03/09/11  Yes Gerrit Friends. Rothbart, MD  furosemide (LASIX) 40 MG tablet THE DOSE WAS DECREASED TO 1/2 TABLET 2 TIMES DAILY. 04/06/11  Yes Gerrit Friends. Rothbart, MD  glipiZIDE  (GLUCOTROL) 5 MG tablet Take 5 mg by mouth 2 (two) times daily before a meal.     Yes Historical Provider, MD  hydrALAZINE (APRESOLINE) 100 MG tablet Take 100 mg by mouth 2 (two) times daily. 04/06/11  Yes Gerrit Friends. Rothbart, MD  insulin lispro protamine-insulin lispro (HUMALOG 75/25) (75-25) 100 UNIT/ML SUSP Inject 10-15 Units into the skin 2 (two) times daily. 04/02/11  Yes Elliot Cousin, MD  Multiple Vitamins-Minerals (MULTIVITAMINS THER. W/MINERALS) TABS Take 1 tablet by mouth daily.     Yes Historical Provider, MD  neomycin-bacitracin-polymyxin (NEOSPORIN) [phone] ointment APPLY TO YOUR LEGS ON EVERY Tuesday, Thursday, AND Saturday. 04/02/11  Yes Elliot Cousin, MD  nystatin (MYCOSTATIN) cream APPLY TO YOUR SKIN FOLDS AND LEGS EVERY Monday, Wednesday , AND Friday. 04/02/11  Yes Elliot Cousin, MD  spironolactone (ALDACTONE) 25 MG tablet Take 1 tablet (25 mg total) by mouth daily. 04/06/11  Yes Gerrit Friends. Dietrich Pates, MD    Allergies: No Known Allergies  Past Medical History: Past Medical History  Diagnosis Date  . Morbid obesity   . Diabetes mellitus   . Hypertension   . CHF (congestive heart failure)     Diastolic;   . Pacemaker   . COPD (chronic obstructive pulmonary disease)   . Oxygen dependent   . Pickwickian syndrome   . Chronic back pain   . Coronary artery disease   . Shortness of breath   .  Obesity hypoventilation syndrome   . Myocardial infarction   . Arthritis   . Hiatal hernia   . Tachy-brady syndrome     s/p pacemaker 2008  . Cellulitis     Recurrent bilateral LE  . Respiratory failure, acute 12/2009    Sec to diastolic CHF and COPD  . Constipation 04/02/2011    Past Surgical History  Procedure Date  . Abdominal hysterectomy   . Pacemaker insertion     Social History:  reports that she has quit smoking. She has never used smokeless tobacco. She reports that she does not drink alcohol or use illicit drugs.  Family History:  Family History  Problem  Relation Age of Onset  . Coronary artery disease Mother     MI age 38, died.  . Hypertension Father   . Osteoarthritis Father   . Hypertension Sister   . Hypertension Brother   . Obesity Father     Review of Systems - History obtained from the patient General ROS: positive for  - chills Psychological ROS: negative Ophthalmic ROS: negative ENT ROS: negative Allergy and Immunology ROS: negative Hematological and Lymphatic ROS: negative Endocrine ROS: negative Respiratory ROS: positive for - shortness of breath at baseline Cardiovascular ROS: negative Gastrointestinal ROS: negative Genito-Urinary ROS: negative Musculoskeletal ROS: negative Neurological ROS: negative Dermatological ROS: as in hpi  Physical Examination Blood pressure 135/69, pulse 82, temperature 99 F (37.2 C), resp. rate 21, height 5' (1.524 m), SpO2 97.00%.  General appearance: alert, cooperative, no distress and morbidly obese Head: Normocephalic, without obvious abnormality, atraumatic Eyes: conjunctivae/corneas clear. PERRL, EOM's intact.  Throat: lips, mucosa, and tongue normal; teeth and gums normal Neck: no adenopathy, no carotid bruit, no JVD, supple, symmetrical, trachea midline and thyroid not enlarged, symmetric, no tenderness/mass/nodules Resp: clear to auscultation bilaterally Cardio: regular rate and rhythm, S1, S2 normal, no murmur, click, rub or gallop GI: soft, non-tender; bowel sounds normal; no masses,  no organomegaly Extremities: The right thigh is erythematous. It's warm to touch and tender to palpation. No obvious abscesses noted. No breast drainage is noted. Pulses: 2+ and symmetric Skin: Erythema over the right thigh is noted Lymph nodes: Cervical, supraclavicular, and axillary nodes normal. Neurologic: Grossly normal  Laboratory Data: Results for orders placed during the hospital encounter of 07/09/11 (from the past 48 hour(s))  CBC     Status: Abnormal   Collection Time    07/09/11 11:46 PM      Component Value Range Comment   WBC 13.8 (*) 4.0 - 10.5 (K/uL)    RBC 3.41 (*) 3.87 - 5.11 (MIL/uL)    Hemoglobin 9.6 (*) 12.0 - 15.0 (g/dL)    HCT 16.1 (*) 09.6 - 46.0 (%)    MCV 93.0  78.0 - 100.0 (fL)    MCH 28.2  26.0 - 34.0 (pg)    MCHC 30.3  30.0 - 36.0 (g/dL)    RDW 04.5 (*) 40.9 - 15.5 (%)    Platelets 259  150 - 400 (K/uL)   DIFFERENTIAL     Status: Abnormal   Collection Time   07/09/11 11:46 PM      Component Value Range Comment   Neutrophils Relative 87 (*) 43 - 77 (%)    Neutro Abs 11.9 (*) 1.7 - 7.7 (K/uL)    Lymphocytes Relative 7 (*) 12 - 46 (%)    Lymphs Abs 0.9  0.7 - 4.0 (K/uL)    Monocytes Relative 4  3 - 12 (%)    Monocytes Absolute  0.7  0.1 - 1.0 (K/uL)    Eosinophils Relative 2  0 - 5 (%)    Eosinophils Absolute 0.2  0.0 - 0.7 (K/uL)    Basophils Relative 0  0 - 1 (%)    Basophils Absolute 0.0  0.0 - 0.1 (K/uL)   BASIC METABOLIC PANEL     Status: Abnormal   Collection Time   07/09/11 11:46 PM      Component Value Range Comment   Sodium 139  135 - 145 (mEq/L)    Potassium 4.1  3.5 - 5.1 (mEq/L)    Chloride 90 (*) 96 - 112 (mEq/L)    CO2 41 (*) 19 - 32 (mEq/L)    Glucose, Bld 232 (*) 70 - 99 (mg/dL)    BUN 28 (*) 6 - 23 (mg/dL)    Creatinine, Ser 7.82  0.50 - 1.10 (mg/dL)    Calcium 95.6  8.4 - 10.5 (mg/dL)    GFR calc non Af Amer 66 (*) >90 (mL/min)    GFR calc Af Amer 76 (*) >90 (mL/min)   SEDIMENTATION RATE     Status: Abnormal   Collection Time   07/09/11 11:46 PM      Component Value Range Comment   Sed Rate 120 (*) 0 - 22 (mm/hr)      Assessment/Plan  Principal Problem:  *Cellulitis of leg Active Problems:  DIABETES MELLITUS, TYPE II  OBESITY, MORBID  Anemia, iron deficiency  SLEEP APNEA, OBSTRUCTIVE  HYPERTENSION  PACEMAKER, PERMANENT   #1 cellulitis of the right lower extremity: This will be treated with Ancef. Pain medications will be provided.  #2 history of, diabetes, on insulin. She'll be put on a sliding  scale. Her insulin regimen will be continued in the hospital.  #3 iron deficiency anemia. Continue with iron pills. Hemoglobin is at baseline.  #4 History of sleep apnea continue with CPAP.  #5 History of hypertension. Continue with anti hypertensive agents.  #6 Elevated bicarbonate level is probably compensatory increase for chronic respiratory failure. It could also be due to contraction alkalosis from the Lasix.  Rest of her medical issues appear to be stable.  Patient is a full code.  Further management decisions will depend on results of further testing and patient's response to treatment.  [facility]  [facility] Pager [phone]  07/10/2011, 2:06 AM

## 2011-07-10 NOTE — ED Notes (Signed)
CRITICAL VALUE ALERT  Critical value received:  ABG Ph-7.33 PCO2- 76 PO2- 344 Bicarb- 39  Date of notification:  07/10/11  Time of notification:  0759  Critical value read back:yes  Nurse who received alert:  Lilia Pro RN  MD notified (1st page):  Dr. Radford Pax  Time of first page:  0800  MD notified (2nd page):  Time of second page:  Responding MD:  Dr. Radford Pax- no new orders received  Time MD responded:  0800

## 2011-07-11 ENCOUNTER — Inpatient Hospital Stay (HOSPITAL_COMMUNITY): Payer: Medicaid Other

## 2011-07-11 LAB — CBC
HCT: 25.5 % — ABNORMAL LOW (ref 36.0–46.0)
MCH: 28.2 pg (ref 26.0–34.0)
MCV: 88.9 fL (ref 78.0–100.0)
RDW: 16.3 % — ABNORMAL HIGH (ref 11.5–15.5)
WBC: 10.3 10*3/uL (ref 4.0–10.5)

## 2011-07-11 LAB — BLOOD GAS, ARTERIAL
Acid-Base Excess: 13.8 mmol/L — ABNORMAL HIGH (ref 0.0–2.0)
Bicarbonate: 37.5 mEq/L — ABNORMAL HIGH (ref 20.0–24.0)
FIO2: 40 %
FIO2: 40 %
O2 Content: 40 L/min
O2 Saturation: 94.4 %
PEEP: 5 cmH2O
Pressure support: 5 cmH2O
TCO2: 33.3 mmol/L (ref 0–100)
pCO2 arterial: 42 mmHg (ref 35.0–45.0)
pCO2 arterial: 71.1 mmHg (ref 35.0–45.0)
pO2, Arterial: 103 mmHg — ABNORMAL HIGH (ref 80.0–100.0)
pO2, Arterial: 79.1 mmHg — ABNORMAL LOW (ref 80.0–100.0)

## 2011-07-11 LAB — COMPREHENSIVE METABOLIC PANEL
BUN: 29 mg/dL — ABNORMAL HIGH (ref 6–23)
CO2: 36 mEq/L — ABNORMAL HIGH (ref 19–32)
Calcium: 9.8 mg/dL (ref 8.4–10.5)
Chloride: 92 mEq/L — ABNORMAL LOW (ref 96–112)
Creatinine, Ser: 1.24 mg/dL — ABNORMAL HIGH (ref 0.50–1.10)
GFR calc non Af Amer: 48 mL/min — ABNORMAL LOW (ref >90)
Total Bilirubin: 0.2 mg/dL — ABNORMAL LOW (ref 0.3–1.2)

## 2011-07-11 LAB — GLUCOSE, CAPILLARY
Glucose-Capillary: 101 mg/dL — ABNORMAL HIGH (ref 70–99)
Glucose-Capillary: 123 mg/dL — ABNORMAL HIGH (ref 70–99)
Glucose-Capillary: 166 mg/dL — ABNORMAL HIGH (ref 70–99)

## 2011-07-11 LAB — CARDIAC PANEL(CRET KIN+CKTOT+MB+TROPI)
CK, MB: 1.4 ng/mL (ref 0.3–4.0)
Relative Index: INVALID (ref 0.0–2.5)
Total CK: 24 U/L (ref 7–177)

## 2011-07-11 MED ORDER — ENOXAPARIN SODIUM 60 MG/0.6ML ~~LOC~~ SOLN
90.0000 mg | Freq: Every day | SUBCUTANEOUS | Status: DC
  Administered 2011-07-12 – 2011-07-16 (×5): 90 mg via SUBCUTANEOUS
  Filled 2011-07-11 (×5): qty 0.3
  Filled 2011-07-11: qty 0.9

## 2011-07-11 MED ORDER — SODIUM CHLORIDE 0.9 % IJ SOLN
INTRAMUSCULAR | Status: AC
  Administered 2011-07-11: 10 mL
  Filled 2011-07-11: qty 3

## 2011-07-11 MED ORDER — SODIUM CHLORIDE 0.9 % IJ SOLN
INTRAMUSCULAR | Status: AC
  Administered 2011-07-11: 10:00:00
  Filled 2011-07-11: qty 3

## 2011-07-11 MED ORDER — PROPOFOL 10 MG/ML IV EMUL
INTRAVENOUS | Status: AC
  Filled 2011-07-11: qty 200

## 2011-07-11 MED ORDER — POTASSIUM CHLORIDE 10 MEQ/100ML IV SOLN
10.0000 meq | INTRAVENOUS | Status: AC
  Administered 2011-07-11 (×4): 10 meq via INTRAVENOUS
  Filled 2011-07-11: qty 400

## 2011-07-11 MED ORDER — ENOXAPARIN SODIUM 60 MG/0.6ML ~~LOC~~ SOLN
50.0000 mg | Freq: Once | SUBCUTANEOUS | Status: AC
  Administered 2011-07-11: 50 mg via SUBCUTANEOUS

## 2011-07-11 NOTE — Progress Notes (Signed)
Subjective: She is more alert and looks more comfortable. She does remain intubated on the ventilator. She is undergoing her wakeup assessment and he is mouthing questions. She says she has a sore throat.  Objective: Vital signs in last 24 hours: Temp:  [98.6 F (37 C)-100.5 F (38.1 C)] 99.6 F (37.6 C) (02/20 0400) Pulse Rate:  [65-90] 74  (02/20 0630) Resp:  [14-26] 17  (02/20 0630) BP: (99-163)/(50-108) 146/82 mmHg (02/20 0630) SpO2:  [96 %-100 %] 96 % (02/20 0630) FiO2 (%):  [39.2 %-60.2 %] 40.2 % (02/20 0630) Weight:  [180.849 kg (398 lb 11.2 oz)] 180.849 kg (398 lb 11.2 oz) (02/19 0945) Weight change:  Last BM Date:  (unknown)  Intake/Output from previous day: 02/19 0701 - 02/20 0700 In: 1753.7 [I.V.:843.7; IV Piggyback:910] Out: 1300 [Urine:1300]  PHYSICAL EXAM General appearance: alert, mild distress and morbidly obese Resp: rhonchi bilaterally Cardio: regular rate and rhythm, S1, S2 normal, no murmur, click, rub or gallop GI: soft, non-tender; bowel sounds normal; no masses,  no organomegaly Extremities: She has significant edema and also has changes suggesting cellulitis with erythema etc.  Lab Results:    Basic Metabolic Panel:  Basename 07/11/11 0249 07/09/11 2346  NA 138 139  K 3.4* 4.1  CL 92* 90*  CO2 36* 41*  GLUCOSE 117* 232*  BUN 29* 28*  CREATININE 1.24* 0.95  CALCIUM 9.8 10.0  MG -- --  PHOS -- --   Liver Function Tests:  Basename 07/11/11 0249  AST 21  ALT 21  ALKPHOS 68  BILITOT 0.2*  PROT 7.0  ALBUMIN 2.9*   No results found for this basename: LIPASE:2,AMYLASE:2 in the last 72 hours No results found for this basename: AMMONIA:2 in the last 72 hours CBC:  Basename 07/11/11 0249 07/09/11 2346  WBC 10.3 13.8*  NEUTROABS -- 11.9*  HGB 8.1* 9.6*  HCT 25.5* 31.7*  MCV 88.9 93.0  PLT 239 259   Cardiac Enzymes:  Basename 07/11/11 0249 07/10/11 1745 07/10/11 1044  CKTOTAL 24 22 24   CKMB 1.4 1.4 1.3  CKMBINDEX -- -- --    TROPONINI <0.30 <0.30 <0.30   BNP: No results found for this basename: PROBNP:3 in the last 72 hours D-Dimer: No results found for this basename: DDIMER:2 in the last 72 hours CBG:  Basename 07/11/11 0622 07/11/11 0408 07/11/11 0105 07/10/11 2038 07/10/11 1634 07/10/11 1204  GLUCAP 113* 123* 101* 87 98 167*   Hemoglobin A1C:  Basename 07/10/11 1044  HGBA1C 7.4*   Fasting Lipid Panel: No results found for this basename: CHOL,HDL,LDLCALC,TRIG,CHOLHDL,LDLDIRECT in the last 72 hours Thyroid Function Tests: No results found for this basename: TSH,T4TOTAL,FREET4,T3FREE,THYROIDAB in the last 72 hours Anemia Panel: No results found for this basename: VITAMINB12,FOLATE,FERRITIN,TIBC,IRON,RETICCTPCT in the last 72 hours Coagulation: No results found for this basename: LABPROT:2,INR:2 in the last 72 hours Urine Drug Screen: Drugs of Abuse     Component Value Date/Time   LABOPIA NEGATIVE 02/25/2009 1755   LABOPIA NONE DETECTED 06/05/2007 1610   COCAINSCRNUR NEGATIVE 02/25/2009 1755   COCAINSCRNUR NONE DETECTED 06/05/2007 1610   LABBENZ NEGATIVE 02/25/2009 1755   LABBENZ NONE DETECTED 06/05/2007 1610   AMPHETMU NEGATIVE 02/25/2009 1755   AMPHETMU NONE DETECTED 06/05/2007 1610   THCU NONE DETECTED 06/05/2007 1610   LABBARB  Value: NONE DETECTED        DRUG SCREEN FOR MEDICAL PURPOSES ONLY.  IF CONFIRMATION IS NEEDED FOR ANY PURPOSE, NOTIFY LAB WITHIN 5 DAYS. 06/05/2007 1610    Alcohol Level: No results  found for this basename: ETH:2 in the last 72 hours Urinalysis: No results found for this basename: COLORURINE:2,APPERANCEUR:2,LABSPEC:2,PHURINE:2,GLUCOSEU:2,HGBUR:2,BILIRUBINUR:2,KETONESUR:2,PROTEINUR:2,UROBILINOGEN:2,NITRITE:2,LEUKOCYTESUR:2 in the last 72 hours Misc. Labs:  ABGS  Basename 07/11/11 0418  PHART 7.559*  PO2ART 103.0*  TCO2 33.3  HCO3 37.5*   CULTURES Recent Results (from the past 240 hour(s))  MRSA PCR SCREENING     Status: Normal   Collection Time   07/10/11  9:37 AM       Component Value Range Status Comment   MRSA by PCR NEGATIVE  NEGATIVE  Final    Studies/Results: Dg Chest Portable 1 View  07/10/2011  *RADIOLOGY REPORT*  Clinical Data: Post intubation  PORTABLE CHEST - 1 VIEW  Comparison: Portable chest x-ray of 02/26/2011  Findings: The tip of the endotracheal tube is approximately 2.9 cm above the carina.  Patchy airspace disease is noted bilaterally particularly in the left lung base suspicious for pneumonia. Cardiomegaly is stable.  A permanent pacemaker remains.  IMPRESSION:  1.  Endotracheal tube tip 2.9 cm above carina. 2.  Patchy airspace disease.  Possible pneumonia.  Original Report Authenticated By: Juline Patch, M.D.    Medications:  Scheduled:   . antiseptic oral rinse  15 mL Mouth Rinse QID  . azithromycin  500 mg Intravenous Q24H  . ceFTAROline (TEFLARO) IV  600 mg Intravenous Q12H  . chlorhexidine  15 mL Mouth/Throat BID  . diazepam      . enoxaparin  40 mg Subcutaneous Daily  . fentaNYL      . fentaNYL  100 mcg Intravenous Once  . insulin aspart  0-20 Units Subcutaneous Q4H  . insulin glargine  30 Units Subcutaneous Daily  . pantoprazole (PROTONIX) IV  40 mg Intravenous Q24H  . potassium chloride  10 mEq Intravenous Q1 Hr x 4  . propofol      . sodium chloride      . DISCONTD: aspirin EC  81 mg Oral Daily  . DISCONTD: carvedilol  25 mg Oral BID WC  . DISCONTD:  ceFAZolin (ANCEF) IV  1 g Intravenous Q8H  . DISCONTD: cloNIDine  0.2 mg Oral BID  . DISCONTD: docusate sodium  100 mg Oral BID  . DISCONTD: ferrous sulfate  325 mg Oral Daily  . DISCONTD: flecainide  100 mg Oral BID  . DISCONTD: furosemide  20 mg Oral BID  . DISCONTD: glipiZIDE  5 mg Oral BID AC  . DISCONTD: hydrALAZINE  100 mg Oral BID  . DISCONTD: insulin aspart  0-15 Units Subcutaneous TID WC  . DISCONTD: insulin aspart  0-5 Units Subcutaneous QHS  . DISCONTD: insulin aspart  0-5 Units Subcutaneous QHS  . DISCONTD: insulin aspart protamine-insulin aspart  15  Units Subcutaneous BID WC  . DISCONTD: levofloxacin (LEVAQUIN) IV  750 mg Intravenous Q24H  . DISCONTD: spironolactone  25 mg Oral Daily   Continuous:   . sodium chloride 75 mL/hr at 07/10/11 1200  . propofol 41.482 mcg/kg/min (07/11/11 0526)   NWG:[id number], acetaminophen, albuterol, hydrALAZINE, HYDROmorphone, ondansetron (ZOFRAN) IV, ondansetron, oxyCODONE  Assesment: She has respiratory failure multifactorial. She has morbid obesity. She has obstructive sleep apnea. She has a history of diastolic congestive heart failure. She probably has a left lung pneumonia. She does appear to be improving however. Principal Problem:  *Cellulitis of leg Active Problems:  DIABETES MELLITUS, TYPE II  OBESITY, MORBID  Anemia, iron deficiency  SLEEP APNEA, OBSTRUCTIVE  HYPERTENSION  PACEMAKER, PERMANENT    Plan: I agree should try some weaning and see how she  does.    LOS: 2 days   Asir Bingley L 07/11/2011, 7:47 AM

## 2011-07-11 NOTE — Progress Notes (Signed)
Subjective: This lady appears better today, still on the ventilator. She is alert and I think we can attempt extubation today. I will leave this to the discretion of Dr. Juanetta Gosling.           Physical Exam: Blood pressure 146/82, pulse 74, temperature 99.6 F (37.6 C), temperature source Axillary, resp. rate 17, height 5' (1.524 m), weight 180.849 kg (398 lb 11.2 oz), SpO2 96.00%. She is morbidly obese. She is sedated. Lung fields anteriorly are clear. Abdomen is soft. Right leg cellulitis as described before.   Investigations:     Basic Metabolic Panel:  Basename 07/11/11 0249 07/09/11 2346  NA 138 139  K 3.4* 4.1  CL 92* 90*  CO2 36* 41*  GLUCOSE 117* 232*  BUN 29* 28*  CREATININE 1.24* 0.95  CALCIUM 9.8 10.0  MG -- --  PHOS -- --       CBC:  Basename 07/11/11 0249 07/09/11 2346  WBC 10.3 13.8*  NEUTROABS -- 11.9*  HGB 8.1* 9.6*  HCT 25.5* 31.7*  MCV 88.9 93.0  PLT 239 259    Dg Chest Portable 1 View  07/10/2011  *RADIOLOGY REPORT*  Clinical Data: Post intubation  PORTABLE CHEST - 1 VIEW  Comparison: Portable chest x-ray of 02/26/2011  Findings: The tip of the endotracheal tube is approximately 2.9 cm above the carina.  Patchy airspace disease is noted bilaterally particularly in the left lung base suspicious for pneumonia. Cardiomegaly is stable.  A permanent pacemaker remains.  IMPRESSION:  1.  Endotracheal tube tip 2.9 cm above carina. 2.  Patchy airspace disease.  Possible pneumonia.  Original Report Authenticated By: Juline Patch, M.D.      Medications: I have reviewed the patient's current medications.  Impression: 1. Right leg cellulitis. 2. Respiratory failure, status post mechanical ventilation. 3. Left sided pneumonia. 4. Morbid obesity. 5 diabetes mellitus. 6. Sleep apnea. 7. Status post permanent pacemaker. 8. Decrease in hemoglobin to 8.1. No evidence of GI bleeding. 9. Hypokalemia.     Plan: 1. Continue with the intravenous  antibiotics of Levaquin IV. 2. Replete potassium. 3. Follow hemoglobin, may need to give blood transfusion. 4. Attempt possible extubation today if Dr. Juanetta Gosling feels this is appropriate.     LOS: 2 days   Wilson Singer Pager [phone]  07/11/2011, 7:54 AM

## 2011-07-11 NOTE — Procedures (Signed)
Extubation Procedure Note Pt's NIF -22, FVC 1.2L, followed simple directions and the Pco2 was a little high but Dr. Juanetta Gosling said to go ahead in extubate and RT agreed. Pt was wide awake and was talking while on vent . She was ready to be extubated. Pt' was extubated at 1000. To 4l [place] with sat 98 , HR 85 and BP was not picking up. Patient Details:   Name: [name] DOB: [birth year] MRN: [id number]   Airway Documentation:  Airway 7.5 mm (Active)  Secured at (cm) 21 cm 07/11/2011  8:00 AM  Measured From Lips 07/11/2011  8:00 AM  Secured Location Right 07/11/2011  8:00 AM  Secured By [employer] 07/11/2011  8:00 AM  Tube Holder Repositioned Yes 07/11/2011  8:00 AM  Cuff Pressure (cm H2O) 27 cm H2O 07/11/2011  8:00 AM  Site Condition Dry 07/11/2011  8:00 AM    Evaluation  O2 sats: stable throughout Complications: No apparent complications Patient did tolerate procedure well. Bilateral Breath Sounds: Diminished   Yes  [name] 07/11/2011, 10:23 AM

## 2011-07-11 NOTE — Progress Notes (Signed)
INITIAL ADULT NUTRITION ASSESSMENT Date: 07/11/2011   Time: 11:25 AM Reason for Assessment: Screened for at Nutrition Risk  ASSESSMENT: Female 56 y.o.  Patient disliked breakfast meal and has poor appetite. Patient stated only eats two meals a day. She stated MD told her that it is important to eat regularly. Patient stated she was considering trying Ensure at home. Patient stated no prior diabetes diet education.   Dx: Cellulitis of leg  Hx:  Past Medical History  Diagnosis Date  . Morbid obesity   . Diabetes mellitus   . Hypertension   . CHF (congestive heart failure)     Diastolic;   . Pacemaker   . COPD (chronic obstructive pulmonary disease)   . Oxygen dependent   . Pickwickian syndrome   . Chronic back pain   . Coronary artery disease   . Shortness of breath   . Obesity hypoventilation syndrome   . Myocardial infarction   . Arthritis   . Hiatal hernia   . Tachy-brady syndrome     s/p pacemaker 2008  . Cellulitis     Recurrent bilateral LE  . Respiratory failure, acute 12/2009    Sec to diastolic CHF and COPD  . Constipation 04/02/2011   Related Meds:  Scheduled Meds:   . antiseptic oral rinse  15 mL Mouth Rinse QID  . azithromycin  500 mg Intravenous Q24H  . ceFTAROline (TEFLARO) IV  600 mg Intravenous Q12H  . diazepam      . enoxaparin (LOVENOX) injection  50 mg Subcutaneous Once  . enoxaparin  90 mg Subcutaneous Daily  . fentaNYL      . fentaNYL  100 mcg Intravenous Once  . insulin aspart  0-20 Units Subcutaneous Q4H  . insulin glargine  30 Units Subcutaneous Daily  . pantoprazole (PROTONIX) IV  40 mg Intravenous Q24H  . potassium chloride  10 mEq Intravenous Q1 Hr x 4  . propofol      . sodium chloride      . sodium chloride      . sodium chloride      . DISCONTD: chlorhexidine  15 mL Mouth/Throat BID  . DISCONTD: enoxaparin  40 mg Subcutaneous Daily  . DISCONTD: insulin aspart  0-5 Units Subcutaneous QHS  . DISCONTD: insulin aspart  0-5 Units  Subcutaneous QHS  . DISCONTD: levofloxacin (LEVAQUIN) IV  750 mg Intravenous Q24H   Continuous Infusions:   . sodium chloride 75 mL/hr at 07/10/11 1200  . propofol Stopped (07/11/11 0814)   PRN Meds:.acetaminophen, acetaminophen, albuterol, hydrALAZINE, HYDROmorphone, ondansetron (ZOFRAN) IV, ondansetron, oxyCODONE  Ht: 5' (152.4 cm)  Wt: 398 lb 11.2 oz (180.849 kg)  Ideal Wt: 45.5 kg 50 kg % Ideal Wt: 398%  Body mass index is 77.87 kg/(m^2). Extreme Obesity Class 3  Food/Nutrition Related Hx: Patient reports eats two meals a day.   Labs:  CMP     Component Value Date/Time   NA 138 07/11/2011 0249   K 3.4* 07/11/2011 0249   CL 92* 07/11/2011 0249   CO2 36* 07/11/2011 0249   GLUCOSE 117* 07/11/2011 0249   BUN 29* 07/11/2011 0249   CREATININE 1.24* 07/11/2011 0249   CALCIUM 9.8 07/11/2011 0249   PROT 7.0 07/11/2011 0249   ALBUMIN 2.9* 07/11/2011 0249   AST 21 07/11/2011 0249   ALT 21 07/11/2011 0249   ALKPHOS 68 07/11/2011 0249   BILITOT 0.2* 07/11/2011 0249   GFRNONAA 48* 07/11/2011 0249   GFRAA 55* 07/11/2011 0249    Intake/Output Summary (  Last 24 hours) at 07/11/11 1127 Last data filed at 07/11/11 0600  Gross per 24 hour  Intake 1741.62 ml  Output   1300 ml  Net 441.62 ml    Diet Order: Carb Modified  Supplements/Tube Feeding: none at this time  IVF:    sodium chloride Last Rate: 75 mL/hr at 07/10/11 1200  propofol Last Rate: Stopped (07/11/11 0814)    Estimated Nutritional Needs:   Kcal: 1900-2100 Protein: 96-120 grams Fluid: 1 ml per kcal  NUTRITION DIAGNOSIS: -Food and nutrition related knowledge deficit (NB-1.1).  Status: Resolved  -Inadequate Oral Intake. Stats: ongoing  RELATED TO: - diagnosis of diabetes   - Poor appetite  AS EVIDENCE BY:  - lack of prior knowledge   -  PO 0 % at breakfast meal  MONITORING/EVALUATION(Goals): PO intake, Labs, Weight trends, Further diet related questions 1. Meet greater than 90% of estimated energy  needs   EDUCATION NEEDS: -Education needs addressed  INTERVENTION: 1. Will order chocolate Glucerna once daily. Provides 220 kcal and 10 grams protein 2. Provided Diabetes Diet education. Also instructed her on food label reading and the importance of checking blood sugar regularly.   Dietitian [phone]  DOCUMENTATION CODES Per approved criteria  -Morbid Obesity    Iven Finn [facility] 07/11/2011, 11:25 AM

## 2011-07-12 LAB — COMPREHENSIVE METABOLIC PANEL
BUN: 20 mg/dL (ref 6–23)
CO2: 39 mEq/L — ABNORMAL HIGH (ref 19–32)
Chloride: 95 mEq/L — ABNORMAL LOW (ref 96–112)
Creatinine, Ser: 1.09 mg/dL (ref 0.50–1.10)
GFR calc Af Amer: 65 mL/min — ABNORMAL LOW (ref >90)
GFR calc non Af Amer: 56 mL/min — ABNORMAL LOW (ref >90)
Total Bilirubin: 0.2 mg/dL — ABNORMAL LOW (ref 0.3–1.2)

## 2011-07-12 LAB — GLUCOSE, CAPILLARY
Glucose-Capillary: 158 mg/dL — ABNORMAL HIGH (ref 70–99)
Glucose-Capillary: 159 mg/dL — ABNORMAL HIGH (ref 70–99)

## 2011-07-12 LAB — CBC
HCT: 28.9 % — ABNORMAL LOW (ref 36.0–46.0)
MCV: 90.3 fL (ref 78.0–100.0)
RDW: 16.5 % — ABNORMAL HIGH (ref 11.5–15.5)
WBC: 9.8 10*3/uL (ref 4.0–10.5)

## 2011-07-12 MED ORDER — CEFTAROLINE FOSAMIL 600 MG IV SOLR
INTRAVENOUS | Status: AC
  Filled 2011-07-12: qty 600

## 2011-07-12 MED ORDER — INSULIN ASPART 100 UNIT/ML ~~LOC~~ SOLN
0.0000 [IU] | Freq: Three times a day (TID) | SUBCUTANEOUS | Status: DC
  Administered 2011-07-12: 4 [IU] via SUBCUTANEOUS
  Administered 2011-07-13: 7 [IU] via SUBCUTANEOUS
  Administered 2011-07-13 – 2011-07-14 (×5): 3 [IU] via SUBCUTANEOUS
  Administered 2011-07-15: 4 [IU] via SUBCUTANEOUS
  Administered 2011-07-15 – 2011-07-16 (×3): 3 [IU] via SUBCUTANEOUS
  Administered 2011-07-17: 4 [IU] via SUBCUTANEOUS
  Administered 2011-07-17 – 2011-07-18 (×2): 3 [IU] via SUBCUTANEOUS

## 2011-07-12 MED ORDER — SODIUM CHLORIDE 0.9 % IJ SOLN
INTRAMUSCULAR | Status: AC
  Administered 2011-07-12: 10 mL
  Filled 2011-07-12: qty 3

## 2011-07-12 MED ORDER — GLUCERNA SHAKE PO LIQD
237.0000 mL | Freq: Every morning | ORAL | Status: DC
  Administered 2011-07-12 – 2011-07-17 (×6): 237 mL via ORAL

## 2011-07-12 MED ORDER — FUROSEMIDE 20 MG PO TABS
20.0000 mg | ORAL_TABLET | Freq: Two times a day (BID) | ORAL | Status: DC
  Administered 2011-07-12 – 2011-07-18 (×13): 20 mg via ORAL
  Filled 2011-07-12 (×13): qty 1

## 2011-07-12 NOTE — Plan of Care (Signed)
Problem: Food- and Nutrition-Related Knowledge Deficit (NB-1.1) Goal: Nutrition education Formal process to instruct or train a patient/client in a skill or to impart knowledge to help patients/clients voluntarily manage or modify food choices and eating behavior to maintain or improve health.  Outcome: Completed/Met Date Met:  07/12/11 Knowledge deficit resolved with diet education on 2/2.  Comments:  Discussed and provided hand out for diabetes nutrition therapy. Handout obtained from ADA Nutrition Care Manual. Also discussed food label reading. Patient and family asked good questions. No further questions. Verbalized understanding. Expect good compliance.   Iven Finn, MS, RD, LDN Pager [phone]

## 2011-07-12 NOTE — Progress Notes (Signed)
Subjective: This lady was successfully extubated yesterday and she has done well. She is now at her baseline 4 L of oxygen per minute. The right leg cellulitis appears to be improving.           Physical Exam: Blood pressure 150/88, pulse 78, temperature 98.2 F (36.8 C), temperature source Axillary, resp. rate 20, height 5' (1.524 m), weight 180.849 kg (398 lb 11.2 oz), SpO2 94.00%. She is morbidly obese.  Lung fields anteriorly are clear. Abdomen is soft. Right leg cellulitis as described before is improving.   Investigations:     Basic Metabolic Panel:  Basename 07/12/11 0442 07/11/11 0249  NA 140 138  K 3.6 3.4*  CL 95* 92*  CO2 39* 36*  GLUCOSE 170* 117*  BUN 20 29*  CREATININE 1.09 1.24*  CALCIUM 9.7 9.8  MG -- --  PHOS -- --       CBC:  Basename 07/12/11 0442 07/11/11 0249 07/09/11 2346  WBC 9.8 10.3 --  NEUTROABS -- -- 11.9*  HGB 9.0* 8.1* --  HCT 28.9* 25.5* --  MCV 90.3 88.9 --  PLT 260 239 --    Dg Chest Port 1 View  07/11/2011  *RADIOLOGY REPORT*  Clinical Data: Respiratory failure  PORTABLE CHEST - 1 VIEW  Comparison: 07/10/2011  Findings: Cardiomegaly again noted.  Endotracheal tube in place with tip 3.3 cm above the carina.  NG tube in place.  Again noted patchy left perihilar and left basilar airspace suspicious for infiltrate/pneumonia.  No convincing pulmonary edema. Dual lead cardiac pacemaker is unchanged in position.  IMPRESSION: Cardiomegaly.Endotracheal tube in place with tip 3.3 cm above the carina.  NG tube in place.  Again noted patchy left perihilar and left basilar airspace suspicious for infiltrate/pneumonia.  No convincing pulmonary edema.  Original Report Authenticated By: Natasha Mead, M.D.      Medications: I have reviewed the patient's current medications.  Impression: 1. Right leg cellulitis. 2. Respiratory failure, likely secondary to intravenous hydromorphone, now extubated. 3. Left sided pneumonia. 4. Morbid obesity. 5  diabetes mellitus. 6. Sleep apnea. 7. Status post permanent pacemaker. 8. hemoglobin now stable.      Plan: 1. Continue with intravenous antibiotics. 2. Discussed at length strategies for healthier diet in order to enable weight loss. 3. Move to regular medical floor.     LOS: 3 days   Wilson Singer Pager [phone]  07/12/2011, 8:04 AM

## 2011-07-12 NOTE — Progress Notes (Signed)
Subjective: She is overall better. She was able to be extubated yesterday . She seems to doing very well. She says she's not very short of breath now she's not coughing.  Objective: Vital signs in last 24 hours: Temp:  [98.1 F (36.7 C)-99.7 F (37.6 C)] 98.2 F (36.8 C) (02/21 0400) Pulse Rate:  [67-85] 78  (02/21 0500) Resp:  [15-23] 20  (02/21 0500) BP: (124-176)/(62-111) 150/88 mmHg (02/21 0500) SpO2:  [89 %-100 %] 94 % (02/21 0500) FiO2 (%):  [39.9 %-40.5 %] 40.3 % (02/20 1000) Weight change:  Last BM Date: 07/11/11  Intake/Output from previous day: 02/20 0701 - 02/21 0700 In: 1674 [P.O.:924; IV Piggyback:750] Out: 1250 [Urine:1250]  PHYSICAL EXAM General appearance: alert, cooperative and morbidly obese Resp: diminished breath sounds bilaterally Cardio: regular rate and rhythm, S1, S2 normal, no murmur, click, rub or gallop GI: soft, non-tender; bowel sounds normal; no masses,  no organomegaly Extremities: venous stasis dermatitis noted and Some erythema of the thigh  Lab Results:    Basic Metabolic Panel:  Basename 07/12/11 0442 07/11/11 0249  NA 140 138  K 3.6 3.4*  CL 95* 92*  CO2 39* 36*  GLUCOSE 170* 117*  BUN 20 29*  CREATININE 1.09 1.24*  CALCIUM 9.7 9.8  MG -- --  PHOS -- --   Liver Function Tests:  Basename 07/12/11 0442 07/11/11 0249  AST 16 21  ALT 18 21  ALKPHOS 75 68  BILITOT 0.2* 0.2*  PROT 7.7 7.0  ALBUMIN 3.1* 2.9*   No results found for this basename: LIPASE:2,AMYLASE:2 in the last 72 hours No results found for this basename: AMMONIA:2 in the last 72 hours CBC:  Basename 07/12/11 0442 07/11/11 0249 07/09/11 2346  WBC 9.8 10.3 --  NEUTROABS -- -- 11.9*  HGB 9.0* 8.1* --  HCT 28.9* 25.5* --  MCV 90.3 88.9 --  PLT 260 239 --   Cardiac Enzymes:  Basename 07/11/11 0249 07/10/11 1745 07/10/11 1044  CKTOTAL 24 22 24   CKMB 1.4 1.4 1.3  CKMBINDEX -- -- --  TROPONINI <0.30 <0.30 <0.30   BNP: No results found for this basename:  PROBNP:3 in the last 72 hours D-Dimer: No results found for this basename: DDIMER:2 in the last 72 hours CBG:  Basename 07/12/11 0630 07/12/11 0534 07/12/11 0241 07/11/11 2258 07/11/11 1947 07/11/11 1708  GLUCAP 155* 159* 158* 110* 139* 166*   Hemoglobin A1C:  Basename 07/10/11 1044  HGBA1C 7.4*   Fasting Lipid Panel: No results found for this basename: CHOL,HDL,LDLCALC,TRIG,CHOLHDL,LDLDIRECT in the last 72 hours Thyroid Function Tests: No results found for this basename: TSH,T4TOTAL,FREET4,T3FREE,THYROIDAB in the last 72 hours Anemia Panel: No results found for this basename: VITAMINB12,FOLATE,FERRITIN,TIBC,IRON,RETICCTPCT in the last 72 hours Coagulation: No results found for this basename: LABPROT:2,INR:2 in the last 72 hours Urine Drug Screen: Drugs of Abuse     Component Value Date/Time   LABOPIA NEGATIVE 02/25/2009 1755   LABOPIA NONE DETECTED 06/05/2007 1610   COCAINSCRNUR NEGATIVE 02/25/2009 1755   COCAINSCRNUR NONE DETECTED 06/05/2007 1610   LABBENZ NEGATIVE 02/25/2009 1755   LABBENZ NONE DETECTED 06/05/2007 1610   AMPHETMU NEGATIVE 02/25/2009 1755   AMPHETMU NONE DETECTED 06/05/2007 1610   THCU NONE DETECTED 06/05/2007 1610   LABBARB  Value: NONE DETECTED        DRUG SCREEN FOR MEDICAL PURPOSES ONLY.  IF CONFIRMATION IS NEEDED FOR ANY PURPOSE, NOTIFY LAB WITHIN 5 DAYS. 06/05/2007 1610    Alcohol Level: No results found for this basename: ETH:2 in the  last 72 hours Urinalysis: No results found for this basename: COLORURINE:2,APPERANCEUR:2,LABSPEC:2,PHURINE:2,GLUCOSEU:2,HGBUR:2,BILIRUBINUR:2,KETONESUR:2,PROTEINUR:2,UROBILINOGEN:2,NITRITE:2,LEUKOCYTESUR:2 in the last 72 hours Misc. Labs:  ABGS  Basename 07/11/11 0949  PHART 7.350  PO2ART 79.1*  TCO2 36.2  HCO3 38.3*   CULTURES Recent Results (from the past 240 hour(s))  MRSA PCR SCREENING     Status: Normal   Collection Time   07/10/11  9:37 AM      Component Value Range Status Comment   MRSA by PCR NEGATIVE   NEGATIVE  Final    Studies/Results: Dg Chest Port 1 View  07/11/2011  *RADIOLOGY REPORT*  Clinical Data: Respiratory failure  PORTABLE CHEST - 1 VIEW  Comparison: 07/10/2011  Findings: Cardiomegaly again noted.  Endotracheal tube in place with tip 3.3 cm above the carina.  NG tube in place.  Again noted patchy left perihilar and left basilar airspace suspicious for infiltrate/pneumonia.  No convincing pulmonary edema. Dual lead cardiac pacemaker is unchanged in position.  IMPRESSION: Cardiomegaly.Endotracheal tube in place with tip 3.3 cm above the carina.  NG tube in place.  Again noted patchy left perihilar and left basilar airspace suspicious for infiltrate/pneumonia.  No convincing pulmonary edema.  Original Report Authenticated By: Natasha Mead, M.D.   Dg Chest Portable 1 View  07/10/2011  *RADIOLOGY REPORT*  Clinical Data: Post intubation  PORTABLE CHEST - 1 VIEW  Comparison: Portable chest x-ray of 02/26/2011  Findings: The tip of the endotracheal tube is approximately 2.9 cm above the carina.  Patchy airspace disease is noted bilaterally particularly in the left lung base suspicious for pneumonia. Cardiomegaly is stable.  A permanent pacemaker remains.  IMPRESSION:  1.  Endotracheal tube tip 2.9 cm above carina. 2.  Patchy airspace disease.  Possible pneumonia.  Original Report Authenticated By: Juline Patch, M.D.    Medications:  Scheduled:   . antiseptic oral rinse  15 mL Mouth Rinse QID  . azithromycin  500 mg Intravenous Q24H  . ceFTAROline (TEFLARO) IV  600 mg Intravenous Q12H  . enoxaparin (LOVENOX) injection  50 mg Subcutaneous Once  . enoxaparin  90 mg Subcutaneous Daily  . fentaNYL  100 mcg Intravenous Once  . insulin aspart  0-20 Units Subcutaneous Q4H  . insulin glargine  30 Units Subcutaneous Daily  . pantoprazole (PROTONIX) IV  40 mg Intravenous Q24H  . potassium chloride  10 mEq Intravenous Q1 Hr x 4  . sodium chloride      . sodium chloride      . sodium chloride      .  DISCONTD: chlorhexidine  15 mL Mouth/Throat BID  . DISCONTD: enoxaparin  40 mg Subcutaneous Daily   Continuous:   . DISCONTD: propofol Stopped (07/11/11 4540)   JWJ:[id number], acetaminophen, albuterol, hydrALAZINE, HYDROmorphone, ondansetron (ZOFRAN) IV, ondansetron, oxyCODONE  Assesment: She was admitted with cellulitis of the leg and developed acute respiratory failure. She has chronic respiratory failure and obesity hypoventilation syndrome. Principal Problem:  *Cellulitis of leg Active Problems:  DIABETES MELLITUS, TYPE II  OBESITY, MORBID  Anemia, iron deficiency  SLEEP APNEA, OBSTRUCTIVE  HYPERTENSION  PACEMAKER, PERMANENT    Plan: She's off the ventilator and seems to be doing very well on current treatment. I would continue with her treatments. She does have baseline problems with her respiratory status so we need to watch her carefully.    LOS: 3 days   Rodolfo Notaro L 07/12/2011, 7:46 AM

## 2011-07-12 NOTE — Progress Notes (Signed)
Patient refused to wear our BIPAP unit due to being claustrophobic. Patient informed me that she has had a sleep study at home and will be set-up on a home unit with a nasal attachment. Patient is wearing on and will be monitored through the night.

## 2011-07-13 LAB — GLUCOSE, CAPILLARY: Glucose-Capillary: 139 mg/dL — ABNORMAL HIGH (ref 70–99)

## 2011-07-13 LAB — CBC
Hemoglobin: 9.1 g/dL — ABNORMAL LOW (ref 12.0–15.0)
MCHC: 29.8 g/dL — ABNORMAL LOW (ref 30.0–36.0)
RBC: 3.28 MIL/uL — ABNORMAL LOW (ref 3.87–5.11)
WBC: 8.9 10*3/uL (ref 4.0–10.5)

## 2011-07-13 LAB — COMPREHENSIVE METABOLIC PANEL
ALT: 14 U/L (ref 0–35)
Alkaline Phosphatase: 76 U/L (ref 39–117)
CO2: 38 mEq/L — ABNORMAL HIGH (ref 19–32)
Chloride: 96 mEq/L (ref 96–112)
GFR calc Af Amer: 61 mL/min — ABNORMAL LOW (ref >90)
Glucose, Bld: 157 mg/dL — ABNORMAL HIGH (ref 70–99)
Potassium: 3.8 mEq/L (ref 3.5–5.1)
Sodium: 141 mEq/L (ref 135–145)
Total Protein: 7.8 g/dL (ref 6.0–8.3)

## 2011-07-13 MED ORDER — NYSTATIN 100000 UNIT/GM EX POWD
Freq: Three times a day (TID) | CUTANEOUS | Status: DC
  Administered 2011-07-13 – 2011-07-18 (×13): via TOPICAL
  Filled 2011-07-13: qty 15

## 2011-07-13 MED ORDER — PANTOPRAZOLE SODIUM 40 MG PO TBEC
40.0000 mg | DELAYED_RELEASE_TABLET | Freq: Every day | ORAL | Status: DC
  Administered 2011-07-13 – 2011-07-18 (×6): 40 mg via ORAL
  Filled 2011-07-13 (×6): qty 1

## 2011-07-13 NOTE — Progress Notes (Signed)
Pt ordered BiPAP at night, night shift RT explained the importance of BiPAP use pt refused BiPAP the night of  07/12/11. Unit  Has not been placed.

## 2011-07-13 NOTE — Progress Notes (Signed)
CARE MANAGEMENT NOTE 07/13/2011  Patient:  [name], [name]   Account Number:  [id number]  Date Initiated:  07/13/2011  Documentation initiated by:  Rosemary Holms  Subjective/Objective Assessment:   pt admitted with leg pain. PTA lived at home with SO. Was currently with [facility].     Action/Plan:   Pt will resume HH with [facility]   Anticipated DC Date:  07/14/2011   Anticipated DC Plan:  HOME W HOME HEALTH SERVICES      DC Planning Services  CM consult      Choice offered to / List presented to:             [facility] agency  [facility]   Status of service:  In process, will continue to follow Medicare Important Message given?   (If response is "NO", the following Medicare IM given date fields will be blank) Date Medicare IM given:   Date Additional Medicare IM given:    Discharge Disposition:    Per UR Regulation:    Comments:  07/13/11 1400 Atif Chapple Leanord Hawking RN BSN

## 2011-07-13 NOTE — Progress Notes (Signed)
Subjective: Feeling better, but weak, coughing up sputum  Objective: Vital signs in last 24 hours: Temp:  [98.2 F (36.8 C)-98.9 F (37.2 C)] 98.2 F (36.8 C) (02/22 0558) Pulse Rate:  [78-85] 85  (02/22 0558) Resp:  [20] 20  (02/22 0558) BP: (156-189)/(85-91) 159/91 mmHg (02/22 0558) SpO2:  [93 %-99 %] 98 % (02/22 0935) Weight change:  Last BM Date: 07/09/11  Intake/Output from previous day: 02/21 0701 - 02/22 0700 In: 960 [P.O.:960] Out: -  Total I/O In: 240 [P.O.:240] Out: -    Physical Exam: General: Alert, awake, oriented x3, in no acute distress. HEENT: No bruits, no goiter. Heart: Regular rate and rhythm, without murmurs, rubs, gallops. Lungs: occasional rhonchi. Abdomen: Soft, nontender, nondistended, positive bowel sounds. Extremities:Chronic edema with no visible erythema Neuro: Grossly intact, nonfocal.    Lab Results: Basic Metabolic Panel:  Basename 07/13/11 0610 07/12/11 0442  NA 141 140  K 3.8 3.6  CL 96 95*  CO2 38* 39*  GLUCOSE 157* 170*  BUN 15 20  CREATININE 1.14* 1.09  CALCIUM 9.7 9.7  MG -- --  PHOS -- --   Liver Function Tests:  Basename 07/13/11 0610 07/12/11 0442  AST 13 16  ALT 14 18  ALKPHOS 76 75  BILITOT 0.2* 0.2*  PROT 7.8 7.7  ALBUMIN 3.1* 3.1*   No results found for this basename: LIPASE:2,AMYLASE:2 in the last 72 hours No results found for this basename: AMMONIA:2 in the last 72 hours CBC:  Basename 07/13/11 0610 07/12/11 0442  WBC 8.9 9.8  NEUTROABS -- --  HGB 9.1* 9.0*  HCT 30.5* 28.9*  MCV 93.0 90.3  PLT 254 260   Cardiac Enzymes:  Basename 07/11/11 0249 07/10/11 1745  CKTOTAL 24 22  CKMB 1.4 1.4  CKMBINDEX -- --  TROPONINI <0.30 <0.30   BNP: No results found for this basename: PROBNP:3 in the last 72 hours D-Dimer: No results found for this basename: DDIMER:2 in the last 72 hours CBG:  Basename 07/13/11 0750 07/12/11 2144 07/12/11 1050 07/12/11 0630 07/12/11 0534 07/12/11 0241  GLUCAP 238* 180*  146* 155* 159* 158*   Hemoglobin A1C: No results found for this basename: HGBA1C in the last 72 hours Fasting Lipid Panel: No results found for this basename: CHOL,HDL,LDLCALC,TRIG,CHOLHDL,LDLDIRECT in the last 72 hours Thyroid Function Tests: No results found for this basename: TSH,T4TOTAL,FREET4,T3FREE,THYROIDAB in the last 72 hours Anemia Panel: No results found for this basename: VITAMINB12,FOLATE,FERRITIN,TIBC,IRON,RETICCTPCT in the last 72 hours Coagulation: No results found for this basename: LABPROT:2,INR:2 in the last 72 hours Urine Drug Screen: Drugs of Abuse     Component Value Date/Time   LABOPIA NEGATIVE 02/25/2009 1755   LABOPIA NONE DETECTED 06/05/2007 1610   COCAINSCRNUR NEGATIVE 02/25/2009 1755   COCAINSCRNUR NONE DETECTED 06/05/2007 1610   LABBENZ NEGATIVE 02/25/2009 1755   LABBENZ NONE DETECTED 06/05/2007 1610   AMPHETMU NEGATIVE 02/25/2009 1755   AMPHETMU NONE DETECTED 06/05/2007 1610   THCU NONE DETECTED 06/05/2007 1610   LABBARB  Value: NONE DETECTED        DRUG SCREEN FOR MEDICAL PURPOSES ONLY.  IF CONFIRMATION IS NEEDED FOR ANY PURPOSE, NOTIFY LAB WITHIN 5 DAYS. 06/05/2007 1610    Alcohol Level: No results found for this basename: ETH:2 in the last 72 hours Urinalysis: No results found for this basename: COLORURINE:2,APPERANCEUR:2,LABSPEC:2,PHURINE:2,GLUCOSEU:2,HGBUR:2,BILIRUBINUR:2,KETONESUR:2,PROTEINUR:2,UROBILINOGEN:2,NITRITE:2,LEUKOCYTESUR:2 in the last 72 hours  Recent Results (from the past 240 hour(s))  MRSA PCR SCREENING     Status: Normal   Collection Time   07/10/11  9:37  AM      Component Value Range Status Comment   MRSA by PCR NEGATIVE  NEGATIVE  Final     Studies/Results: No results found.  Medications: Scheduled Meds:   . antiseptic oral rinse  15 mL Mouth Rinse QID  . azithromycin  500 mg Intravenous Q24H  . ceFTAROline (TEFLARO) IV  600 mg Intravenous Q12H  . enoxaparin  90 mg Subcutaneous Daily  . feeding supplement  237 mL Oral q  morning - 10a  . fentaNYL  100 mcg Intravenous Once  . furosemide  20 mg Oral BID  . insulin aspart  0-20 Units Subcutaneous TID WC  . insulin glargine  30 Units Subcutaneous Daily  . pantoprazole  40 mg Oral Q1200  . sodium chloride      . DISCONTD: insulin aspart  0-20 Units Subcutaneous Q4H  . DISCONTD: pantoprazole (PROTONIX) IV  40 mg Intravenous Q24H   Continuous Infusions:  PRN Meds:.acetaminophen, acetaminophen, albuterol, hydrALAZINE, HYDROmorphone, ondansetron (ZOFRAN) IV, ondansetron, oxyCODONE  Assessment/Plan:  1. Right leg cellulitis, improving on IV antibiotics, can hopefully transition over to oral antibiotics by tomorrow 2. Respiratory failure, likely secondary to intravenous hydromorphone, now extubated. She is back on 4L oxygen via [place] which is her baseline 3. Left sided pneumonia. Appears to be improving. 4. Morbid obesity.  5 diabetes mellitus., stable  6. Sleep apnea.  7. Status post permanent pacemaker.  8. hemoglobin now stable. 9.  Patient is essential bed bound for last 2 years.  Will set up home health.  She will likely need ambulance transfer home.  I think we may be able to discharge her home tomorrow, provided she continues to improve.   LOS: 4 days   Taneisha Fuson [facility] Pager: [phone] 07/13/2011, 11:47 AM

## 2011-07-14 LAB — GLUCOSE, CAPILLARY
Glucose-Capillary: 135 mg/dL — ABNORMAL HIGH (ref 70–99)
Glucose-Capillary: 143 mg/dL — ABNORMAL HIGH (ref 70–99)

## 2011-07-14 MED ORDER — SODIUM CHLORIDE 0.9 % IJ SOLN
INTRAMUSCULAR | Status: AC
  Filled 2011-07-14: qty 3

## 2011-07-14 NOTE — Progress Notes (Signed)
Subjective: She says she doesn't feel as well today. With the is not a specific complaint but just a general feeling of not feeling well.  Objective: Vital signs in last 24 hours: Temp:  [97.8 F (36.6 C)-98.5 F (36.9 C)] 98.4 F (36.9 C) (02/23 0531) Pulse Rate:  [74-85] 74  (02/23 0934) Resp:  [20-21] 20  (02/23 0934) BP: (153-170)/(73-98) 153/86 mmHg (02/23 0531) SpO2:  [93 %-96 %] 94 % (02/23 0934) Weight change:  Last BM Date: 07/09/11  Intake/Output from previous day: 02/22 0701 - 02/23 0700 In: 1972 [P.O.:720; IV Piggyback:1252] Out: 600 [Urine:600]  PHYSICAL EXAM General appearance: alert, cooperative and morbidly obese Resp: diminished breath sounds bilaterally Cardio: regular rate and rhythm, S1, S2 normal, no murmur, click, rub or gallop GI: soft, non-tender; bowel sounds normal; no masses,  no organomegaly Extremities: Changes of cellulitis  Lab Results:    Basic Metabolic Panel:  Basename 07/13/11 0610 07/12/11 0442  NA 141 140  K 3.8 3.6  CL 96 95*  CO2 38* 39*  GLUCOSE 157* 170*  BUN 15 20  CREATININE 1.14* 1.09  CALCIUM 9.7 9.7  MG -- --  PHOS -- --   Liver Function Tests:  Basename 07/13/11 0610 07/12/11 0442  AST 13 16  ALT 14 18  ALKPHOS 76 75  BILITOT 0.2* 0.2*  PROT 7.8 7.7  ALBUMIN 3.1* 3.1*   No results found for this basename: LIPASE:2,AMYLASE:2 in the last 72 hours No results found for this basename: AMMONIA:2 in the last 72 hours CBC:  Basename 07/13/11 0610 07/12/11 0442  WBC 8.9 9.8  NEUTROABS -- --  HGB 9.1* 9.0*  HCT 30.5* 28.9*  MCV 93.0 90.3  PLT 254 260   Cardiac Enzymes: No results found for this basename: CKTOTAL:3,CKMB:3,CKMBINDEX:3,TROPONINI:3 in the last 72 hours BNP: No results found for this basename: PROBNP:3 in the last 72 hours D-Dimer: No results found for this basename: DDIMER:2 in the last 72 hours CBG:  Basename 07/14/11 0745 07/13/11 1634 07/13/11 1133 07/13/11 0750 07/12/11 2144 07/12/11 1050   GLUCAP 123* 141* 139* 238* 180* 146*   Hemoglobin A1C: No results found for this basename: HGBA1C in the last 72 hours Fasting Lipid Panel: No results found for this basename: CHOL,HDL,LDLCALC,TRIG,CHOLHDL,LDLDIRECT in the last 72 hours Thyroid Function Tests: No results found for this basename: TSH,T4TOTAL,FREET4,T3FREE,THYROIDAB in the last 72 hours Anemia Panel: No results found for this basename: VITAMINB12,FOLATE,FERRITIN,TIBC,IRON,RETICCTPCT in the last 72 hours Coagulation: No results found for this basename: LABPROT:2,INR:2 in the last 72 hours Urine Drug Screen: Drugs of Abuse     Component Value Date/Time   LABOPIA NEGATIVE 02/25/2009 1755   LABOPIA NONE DETECTED 06/05/2007 1610   COCAINSCRNUR NEGATIVE 02/25/2009 1755   COCAINSCRNUR NONE DETECTED 06/05/2007 1610   LABBENZ NEGATIVE 02/25/2009 1755   LABBENZ NONE DETECTED 06/05/2007 1610   AMPHETMU NEGATIVE 02/25/2009 1755   AMPHETMU NONE DETECTED 06/05/2007 1610   THCU NONE DETECTED 06/05/2007 1610   LABBARB  Value: NONE DETECTED        DRUG SCREEN FOR MEDICAL PURPOSES ONLY.  IF CONFIRMATION IS NEEDED FOR ANY PURPOSE, NOTIFY LAB WITHIN 5 DAYS. 06/05/2007 1610    Alcohol Level: No results found for this basename: ETH:2 in the last 72 hours Urinalysis: No results found for this basename: COLORURINE:2,APPERANCEUR:2,LABSPEC:2,PHURINE:2,GLUCOSEU:2,HGBUR:2,BILIRUBINUR:2,KETONESUR:2,PROTEINUR:2,UROBILINOGEN:2,NITRITE:2,LEUKOCYTESUR:2 in the last 72 hours Misc. Labs:  ABGS No results found for this basename: PHART,PCO2,PO2ART,TCO2,HCO3 in the last 72 hours CULTURES Recent Results (from the past 240 hour(s))  MRSA PCR SCREENING  Status: Normal   Collection Time   07/10/11  9:37 AM      Component Value Range Status Comment   MRSA by PCR NEGATIVE  NEGATIVE  Final    Studies/Results: No results found.  Medications:  Scheduled:   . antiseptic oral rinse  15 mL Mouth Rinse QID  . azithromycin  500 mg Intravenous Q24H  .  ceFTAROline (TEFLARO) IV  600 mg Intravenous Q12H  . enoxaparin  90 mg Subcutaneous Daily  . feeding supplement  237 mL Oral q morning - 10a  . fentaNYL  100 mcg Intravenous Once  . furosemide  20 mg Oral BID  . insulin aspart  0-20 Units Subcutaneous TID WC  . insulin glargine  30 Units Subcutaneous Daily  . nystatin   Topical TID  . pantoprazole  40 mg Oral Q1200   Continuous:  ZOX:[id number], acetaminophen, albuterol, hydrALAZINE, HYDROmorphone, ondansetron (ZOFRAN) IV, ondansetron, oxyCODONE  Assesment: She has cellulitis of the leg. She has morbid obesity. She has some COPD and has sleep apnea. Principal Problem:  *Cellulitis of leg Active Problems:  DIABETES MELLITUS, TYPE II  OBESITY, MORBID  Anemia, iron deficiency  SLEEP APNEA, OBSTRUCTIVE  HYPERTENSION  PACEMAKER, PERMANENT    Plan: No change in treatments continue medications and follow.    LOS: 5 days   Corrin Sieling L 07/14/2011, 10:49 AM

## 2011-07-14 NOTE — Progress Notes (Signed)
Subjective: Patient feels weak and fatigued today. She has been sleeping more during the day, does not feel well  Objective: Vital signs in last 24 hours: Temp:  [97.8 F (36.6 C)-98.5 F (36.9 C)] 97.9 F (36.6 C) (02/23 1208) Pulse Rate:  [74-85] 76  (02/23 1228) Resp:  [20-22] 22  (02/23 1228) BP: (153-170)/(73-105) 162/105 mmHg (02/23 1228) SpO2:  [93 %-96 %] 94 % (02/23 1228) Weight change:  Last BM Date: 07/09/11  Intake/Output from previous day: 02/22 0701 - 02/23 0700 In: 1972 [P.O.:720; IV Piggyback:1252] Out: 600 [Urine:600]     Physical Exam: General: Alert, awake, oriented x3, in no acute distress. HEENT: No bruits, no goiter. Heart: Regular rate and rhythm, without murmurs, rubs, gallops. Lungs: Clear to auscultation bilaterally. Abdomen: Soft, nontender, nondistended, positive bowel sounds. Extremities: No clubbing cyanosis or edema with positive pedal pulses. Neuro: Grossly intact, nonfocal.    Lab Results: Basic Metabolic Panel:  Basename 07/13/11 0610 07/12/11 0442  NA 141 140  K 3.8 3.6  CL 96 95*  CO2 38* 39*  GLUCOSE 157* 170*  BUN 15 20  CREATININE 1.14* 1.09  CALCIUM 9.7 9.7  MG -- --  PHOS -- --   Liver Function Tests:  Basename 07/13/11 0610 07/12/11 0442  AST 13 16  ALT 14 18  ALKPHOS 76 75  BILITOT 0.2* 0.2*  PROT 7.8 7.7  ALBUMIN 3.1* 3.1*   No results found for this basename: LIPASE:2,AMYLASE:2 in the last 72 hours No results found for this basename: AMMONIA:2 in the last 72 hours CBC:  Basename 07/13/11 0610 07/12/11 0442  WBC 8.9 9.8  NEUTROABS -- --  HGB 9.1* 9.0*  HCT 30.5* 28.9*  MCV 93.0 90.3  PLT 254 260   Cardiac Enzymes: No results found for this basename: CKTOTAL:3,CKMB:3,CKMBINDEX:3,TROPONINI:3 in the last 72 hours BNP: No results found for this basename: PROBNP:3 in the last 72 hours D-Dimer: No results found for this basename: DDIMER:2 in the last 72 hours CBG:  Basename 07/14/11 1058 07/14/11 0745  07/13/11 1634 07/13/11 1133 07/13/11 0750 07/12/11 2144  GLUCAP 135* 123* 141* 139* 238* 180*   Hemoglobin A1C: No results found for this basename: HGBA1C in the last 72 hours Fasting Lipid Panel: No results found for this basename: CHOL,HDL,LDLCALC,TRIG,CHOLHDL,LDLDIRECT in the last 72 hours Thyroid Function Tests: No results found for this basename: TSH,T4TOTAL,FREET4,T3FREE,THYROIDAB in the last 72 hours Anemia Panel: No results found for this basename: VITAMINB12,FOLATE,FERRITIN,TIBC,IRON,RETICCTPCT in the last 72 hours Coagulation: No results found for this basename: LABPROT:2,INR:2 in the last 72 hours Urine Drug Screen: Drugs of Abuse     Component Value Date/Time   LABOPIA NEGATIVE 02/25/2009 1755   LABOPIA NONE DETECTED 06/05/2007 1610   COCAINSCRNUR NEGATIVE 02/25/2009 1755   COCAINSCRNUR NONE DETECTED 06/05/2007 1610   LABBENZ NEGATIVE 02/25/2009 1755   LABBENZ NONE DETECTED 06/05/2007 1610   AMPHETMU NEGATIVE 02/25/2009 1755   AMPHETMU NONE DETECTED 06/05/2007 1610   THCU NONE DETECTED 06/05/2007 1610   LABBARB  Value: NONE DETECTED        DRUG SCREEN FOR MEDICAL PURPOSES ONLY.  IF CONFIRMATION IS NEEDED FOR ANY PURPOSE, NOTIFY LAB WITHIN 5 DAYS. 06/05/2007 1610    Alcohol Level: No results found for this basename: ETH:2 in the last 72 hours Urinalysis: No results found for this basename: COLORURINE:2,APPERANCEUR:2,LABSPEC:2,PHURINE:2,GLUCOSEU:2,HGBUR:2,BILIRUBINUR:2,KETONESUR:2,PROTEINUR:2,UROBILINOGEN:2,NITRITE:2,LEUKOCYTESUR:2 in the last 72 hours  Recent Results (from the past 240 hour(s))  MRSA PCR SCREENING     Status: Normal   Collection Time   07/10/11  9:37 AM      Component Value Range Status Comment   MRSA by PCR NEGATIVE  NEGATIVE  Final     Studies/Results: No results found.  Medications: Scheduled Meds:   . antiseptic oral rinse  15 mL Mouth Rinse QID  . azithromycin  500 mg Intravenous Q24H  . ceFTAROline (TEFLARO) IV  600 mg Intravenous Q12H  .  enoxaparin  90 mg Subcutaneous Daily  . feeding supplement  237 mL Oral q morning - 10a  . furosemide  20 mg Oral BID  . insulin aspart  0-20 Units Subcutaneous TID WC  . insulin glargine  30 Units Subcutaneous Daily  . nystatin   Topical TID  . pantoprazole  40 mg Oral Q1200  . sodium chloride      . DISCONTD: fentaNYL  100 mcg Intravenous Once   Continuous Infusions:  PRN Meds:.acetaminophen, acetaminophen, albuterol, hydrALAZINE, ondansetron (ZOFRAN) IV, ondansetron, oxyCODONE, DISCONTD: HYDROmorphone  Assessment/Plan:  1. Right leg cellulitis, improving on IV antibiotics, can hopefully transition over to oral antibiotics by tomorrow   2. Respiratory failure, likely secondary to intravenous hydromorphone, now extubated. She is back on 4L oxygen via [place] which is her baseline.  She does appear more somnolent today. I have discontinued her dilaudid, encouraged her to wear the bipap at night as prescribed.  3. Left sided pneumonia. Appears to be improving. Continue antibiotics  4. Morbid obesity.   5 diabetes mellitus., stable   6. Sleep apnea.   7. Status post permanent pacemaker.   8. hemoglobin now stable.   9. Patient is essential bed bound for last 2 years. Will set up home health. She will likely need ambulance transfer home. I think we should watch her for now, to ensure she does not decompensate again.  Will see how she does after wearing the bipap overnight.    LOS: 5 days   Jonathyn Carothers [facility] Pager: [phone] 07/14/2011, 1:06 PM

## 2011-07-14 NOTE — Progress Notes (Signed)
Pt placed on a CPAP auto titrate with nasal mask with 4 liter bled in to mask. Pt appears to tolerate this well. I have my doubts as to how much this is helping her. It seems to be keeping her pO2 elevated on SATs very well as to her PCO2 which has been a chronic problem, remains to be seen. Hopefully this small pressure will keep her airways open an ventilatory status better. She is claustrophobic and very non compliant.

## 2011-07-15 LAB — CBC
HCT: 28.3 % — ABNORMAL LOW (ref 36.0–46.0)
MCH: 27.8 pg (ref 26.0–34.0)
MCHC: 29.3 g/dL — ABNORMAL LOW (ref 30.0–36.0)
MCV: 94.6 fL (ref 78.0–100.0)
Platelets: 270 10*3/uL (ref 150–400)

## 2011-07-15 LAB — BASIC METABOLIC PANEL
BUN: 17 mg/dL (ref 6–23)
CO2: 39 mEq/L — ABNORMAL HIGH (ref 19–32)
Chloride: 94 mEq/L — ABNORMAL LOW (ref 96–112)
Creatinine, Ser: 1.1 mg/dL (ref 0.50–1.10)

## 2011-07-15 LAB — GLUCOSE, CAPILLARY
Glucose-Capillary: 143 mg/dL — ABNORMAL HIGH (ref 70–99)
Glucose-Capillary: 127 mg/dL — ABNORMAL HIGH (ref 70–99)

## 2011-07-15 MED ORDER — SODIUM CHLORIDE 0.9 % IJ SOLN
INTRAMUSCULAR | Status: AC
  Administered 2011-07-16: 10 mL
  Filled 2011-07-15: qty 3

## 2011-07-15 MED ORDER — ALBUTEROL SULFATE (5 MG/ML) 0.5% IN NEBU
2.5000 mg | INHALATION_SOLUTION | RESPIRATORY_TRACT | Status: DC | PRN
  Administered 2011-07-15: 2.5 mg via RESPIRATORY_TRACT
  Filled 2011-07-15: qty 0.5

## 2011-07-15 MED ORDER — SODIUM CHLORIDE 0.9 % IJ SOLN
INTRAMUSCULAR | Status: AC
  Administered 2011-07-15: 10 mL
  Filled 2011-07-15: qty 3

## 2011-07-15 MED ORDER — TRAMADOL HCL 50 MG PO TABS
50.0000 mg | ORAL_TABLET | Freq: Four times a day (QID) | ORAL | Status: DC | PRN
  Administered 2011-07-15 – 2011-07-18 (×5): 50 mg via ORAL
  Filled 2011-07-15 (×5): qty 1

## 2011-07-15 NOTE — Progress Notes (Signed)
Subjective: Patient still appears to be very sleep today, does not feel well.  Falls asleep in conversation.   Objective: Vital signs in last 24 hours: Temp:  [98.6 F (37 C)-98.8 F (37.1 C)] 98.8 F (37.1 C) (02/24 0431) Pulse Rate:  [73-76] 73  (02/24 0431) Resp:  [20-28] 24  (02/24 0431) BP: (155-179)/(85-95) 179/95 mmHg (02/24 0431) SpO2:  [92 %-99 %] 92 % (02/24 1320) Weight change:  Last BM Date: 07/09/11  Intake/Output from previous day: 02/23 0701 - 02/24 0700 In: 540 [P.O.:540] Out: -  Total I/O In: 3 [I.V.:3] Out: -    Physical Exam: General: Alert, awake, oriented x3, in no acute distress. HEENT: No bruits, no goiter. Heart: Regular rate and rhythm, without murmurs, rubs, gallops. Lungs: Clear to auscultation bilaterally. Abdomen: Soft, nontender, nondistended, positive bowel sounds. Extremities: Right thigh is warm and erythematous Neuro: Grossly intact, nonfocal.    Lab Results: Basic Metabolic Panel:  Basename 07/15/11 0648 07/13/11 0610  NA 138 141  K 4.4 3.8  CL 94* 96  CO2 39* 38*  GLUCOSE 141* 157*  BUN 17 15  CREATININE 1.10 1.14*  CALCIUM 9.6 9.7  MG -- --  PHOS -- --   Liver Function Tests:  Basename 07/13/11 0610  AST 13  ALT 14  ALKPHOS 76  BILITOT 0.2*  PROT 7.8  ALBUMIN 3.1*   No results found for this basename: LIPASE:2,AMYLASE:2 in the last 72 hours No results found for this basename: AMMONIA:2 in the last 72 hours CBC:  Basename 07/15/11 0648 07/13/11 0610  WBC 10.0 8.9  NEUTROABS -- --  HGB 8.3* 9.1*  HCT 28.3* 30.5*  MCV 94.6 93.0  PLT 270 254   Cardiac Enzymes: No results found for this basename: CKTOTAL:3,CKMB:3,CKMBINDEX:3,TROPONINI:3 in the last 72 hours BNP: No results found for this basename: PROBNP:3 in the last 72 hours D-Dimer: No results found for this basename: DDIMER:2 in the last 72 hours CBG:  Basename 07/15/11 0815 07/14/11 1730 07/14/11 1058 07/14/11 0745 07/13/11 1634 07/13/11 1133    GLUCAP 143* 143* 135* 123* 141* 139*   Hemoglobin A1C: No results found for this basename: HGBA1C in the last 72 hours Fasting Lipid Panel: No results found for this basename: CHOL,HDL,LDLCALC,TRIG,CHOLHDL,LDLDIRECT in the last 72 hours Thyroid Function Tests: No results found for this basename: TSH,T4TOTAL,FREET4,T3FREE,THYROIDAB in the last 72 hours Anemia Panel: No results found for this basename: VITAMINB12,FOLATE,FERRITIN,TIBC,IRON,RETICCTPCT in the last 72 hours Coagulation: No results found for this basename: LABPROT:2,INR:2 in the last 72 hours Urine Drug Screen: Drugs of Abuse     Component Value Date/Time   LABOPIA NEGATIVE 02/25/2009 1755   LABOPIA NONE DETECTED 06/05/2007 1610   COCAINSCRNUR NEGATIVE 02/25/2009 1755   COCAINSCRNUR NONE DETECTED 06/05/2007 1610   LABBENZ NEGATIVE 02/25/2009 1755   LABBENZ NONE DETECTED 06/05/2007 1610   AMPHETMU NEGATIVE 02/25/2009 1755   AMPHETMU NONE DETECTED 06/05/2007 1610   THCU NONE DETECTED 06/05/2007 1610   LABBARB  Value: NONE DETECTED        DRUG SCREEN FOR MEDICAL PURPOSES ONLY.  IF CONFIRMATION IS NEEDED FOR ANY PURPOSE, NOTIFY LAB WITHIN 5 DAYS. 06/05/2007 1610    Alcohol Level: No results found for this basename: ETH:2 in the last 72 hours Urinalysis: No results found for this basename: COLORURINE:2,APPERANCEUR:2,LABSPEC:2,PHURINE:2,GLUCOSEU:2,HGBUR:2,BILIRUBINUR:2,KETONESUR:2,PROTEINUR:2,UROBILINOGEN:2,NITRITE:2,LEUKOCYTESUR:2 in the last 72 hours  Recent Results (from the past 240 hour(s))  MRSA PCR SCREENING     Status: Normal   Collection Time   07/10/11  9:37 AM  Component Value Range Status Comment   MRSA by PCR NEGATIVE  NEGATIVE  Final     Studies/Results: No results found.  Medications: Scheduled Meds:   . antiseptic oral rinse  15 mL Mouth Rinse QID  . azithromycin  500 mg Intravenous Q24H  . ceFTAROline (TEFLARO) IV  600 mg Intravenous Q12H  . enoxaparin  90 mg Subcutaneous Daily  . feeding supplement   237 mL Oral q morning - 10a  . furosemide  20 mg Oral BID  . insulin aspart  0-20 Units Subcutaneous TID WC  . insulin glargine  30 Units Subcutaneous Daily  . nystatin   Topical TID  . pantoprazole  40 mg Oral Q1200  . sodium chloride      . sodium chloride       Continuous Infusions:  PRN Meds:.acetaminophen, acetaminophen, albuterol, hydrALAZINE, ondansetron (ZOFRAN) IV, ondansetron, oxyCODONE  Assessment/Plan:  1. Right leg cellulitis, improving on IV antibiotics. Slow improvement 2. Respiratory failure, likely secondary to intravenous hydromorphone, now extubated. She is back on 4L oxygen via [place] which is her baseline. She continues to be somnolent.  Will d/c narcotics and use tramadol for pain.  Continue positive pressure ventilation at night.  She will need this at home too.  Will ask CM to help assist in setting this up.  It appears that she will likely have chronic issues with her resp status due to her weight and osa. Check abg in am 3. Left sided pneumonia. Appears to be improving. Continue antibiotics  4. Morbid obesity.  5  Diabetes mellitus., stable  6. Sleep apnea.  7. Status post permanent pacemaker.  8. Anemia, patient's baseline hemoglobin is 8-9.  Stable.  9. Patient is essentially bed bound for last 2 years. Will set up home health. She will likely need ambulance transfer home. I think we should watch her for now, to ensure she does not decompensate again. Will see how she does after wearing the bipap overnight.    LOS: 6 days   Arcadio Cope [facility] Pager: [phone] 07/15/2011, 4:35 PM

## 2011-07-15 NOTE — Progress Notes (Signed)
Pt awakened about 0145 taken off cpap

## 2011-07-15 NOTE — Progress Notes (Signed)
Subjective: She says she feels better today. She has less complaint of being short of breath her leg feels a little bit better as well.  Objective: Vital signs in last 24 hours: Temp:  [97.5 F (36.4 C)-98.8 F (37.1 C)] 98.8 F (37.1 C) (02/24 0431) Pulse Rate:  [73-80] 73  (02/24 0431) Resp:  [20-28] 24  (02/24 0431) BP: (117-179)/(73-105) 179/95 mmHg (02/24 0431) SpO2:  [93 %-99 %] 96 % (02/24 0431) Weight change:  Last BM Date: 07/09/11  Intake/Output from previous day: 02/23 0701 - 02/24 0700 In: 540 [P.O.:540] Out: -   PHYSICAL EXAM General appearance: alert, cooperative and morbidly obese Resp: clear to auscultation bilaterally Cardio: regular rate and rhythm, S1, S2 normal, no murmur, click, rub or gallop GI: soft, non-tender; bowel sounds normal; no masses,  no organomegaly Extremities: Less erythema  Lab Results:    Basic Metabolic Panel:  Basename 07/15/11 0648 07/13/11 0610  NA 138 141  K 4.4 3.8  CL 94* 96  CO2 39* 38*  GLUCOSE 141* 157*  BUN 17 15  CREATININE 1.10 1.14*  CALCIUM 9.6 9.7  MG -- --  PHOS -- --   Liver Function Tests:  Basename 07/13/11 0610  AST 13  ALT 14  ALKPHOS 76  BILITOT 0.2*  PROT 7.8  ALBUMIN 3.1*   No results found for this basename: LIPASE:2,AMYLASE:2 in the last 72 hours No results found for this basename: AMMONIA:2 in the last 72 hours CBC:  Basename 07/15/11 0648 07/13/11 0610  WBC 10.0 8.9  NEUTROABS -- --  HGB 8.3* 9.1*  HCT 28.3* 30.5*  MCV 94.6 93.0  PLT 270 254   Cardiac Enzymes: No results found for this basename: CKTOTAL:3,CKMB:3,CKMBINDEX:3,TROPONINI:3 in the last 72 hours BNP: No results found for this basename: PROBNP:3 in the last 72 hours D-Dimer: No results found for this basename: DDIMER:2 in the last 72 hours CBG:  Basename 07/15/11 0815 07/14/11 1730 07/14/11 1058 07/14/11 0745 07/13/11 1634 07/13/11 1133  GLUCAP 143* 143* 135* 123* 141* 139*   Hemoglobin A1C: No results found for  this basename: HGBA1C in the last 72 hours Fasting Lipid Panel: No results found for this basename: CHOL,HDL,LDLCALC,TRIG,CHOLHDL,LDLDIRECT in the last 72 hours Thyroid Function Tests: No results found for this basename: TSH,T4TOTAL,FREET4,T3FREE,THYROIDAB in the last 72 hours Anemia Panel: No results found for this basename: VITAMINB12,FOLATE,FERRITIN,TIBC,IRON,RETICCTPCT in the last 72 hours Coagulation: No results found for this basename: LABPROT:2,INR:2 in the last 72 hours Urine Drug Screen: Drugs of Abuse     Component Value Date/Time   LABOPIA NEGATIVE 02/25/2009 1755   LABOPIA NONE DETECTED 06/05/2007 1610   COCAINSCRNUR NEGATIVE 02/25/2009 1755   COCAINSCRNUR NONE DETECTED 06/05/2007 1610   LABBENZ NEGATIVE 02/25/2009 1755   LABBENZ NONE DETECTED 06/05/2007 1610   AMPHETMU NEGATIVE 02/25/2009 1755   AMPHETMU NONE DETECTED 06/05/2007 1610   THCU NONE DETECTED 06/05/2007 1610   LABBARB  Value: NONE DETECTED        DRUG SCREEN FOR MEDICAL PURPOSES ONLY.  IF CONFIRMATION IS NEEDED FOR ANY PURPOSE, NOTIFY LAB WITHIN 5 DAYS. 06/05/2007 1610    Alcohol Level: No results found for this basename: ETH:2 in the last 72 hours Urinalysis: No results found for this basename: COLORURINE:2,APPERANCEUR:2,LABSPEC:2,PHURINE:2,GLUCOSEU:2,HGBUR:2,BILIRUBINUR:2,KETONESUR:2,PROTEINUR:2,UROBILINOGEN:2,NITRITE:2,LEUKOCYTESUR:2 in the last 72 hours Misc. Labs:  ABGS No results found for this basename: PHART,PCO2,PO2ART,TCO2,HCO3 in the last 72 hours CULTURES Recent Results (from the past 240 hour(s))  MRSA PCR SCREENING     Status: Normal   Collection Time   07/10/11  9:37 AM      Component Value Range Status Comment   MRSA by PCR NEGATIVE  NEGATIVE  Final    Studies/Results: No results found.  Medications:  Scheduled:   . antiseptic oral rinse  15 mL Mouth Rinse QID  . azithromycin  500 mg Intravenous Q24H  . ceFTAROline (TEFLARO) IV  600 mg Intravenous Q12H  . enoxaparin  90 mg Subcutaneous  Daily  . feeding supplement  237 mL Oral q morning - 10a  . furosemide  20 mg Oral BID  . insulin aspart  0-20 Units Subcutaneous TID WC  . insulin glargine  30 Units Subcutaneous Daily  . nystatin   Topical TID  . pantoprazole  40 mg Oral Q1200  . sodium chloride      . DISCONTD: fentaNYL  100 mcg Intravenous Once   Continuous:  ZOX:[id number], acetaminophen, albuterol, hydrALAZINE, ondansetron (ZOFRAN) IV, ondansetron, oxyCODONE, DISCONTD: HYDROmorphone  Assesment: She was admitted with acute respiratory failure. She has chronic respiratory failure on the basis of obesity hypoventilation. She may be a candidate for home BiPAP. She looks much better today. Principal Problem:  *Cellulitis of leg Active Problems:  DIABETES MELLITUS, TYPE II  OBESITY, MORBID  Anemia, iron deficiency  SLEEP APNEA, OBSTRUCTIVE  HYPERTENSION  PACEMAKER, PERMANENT    Plan: She is being evaluated to see if she is a candidate for BiPAP. I am concerned that because of her morbid obesity sleep apnea and obesity hypoventilation that she is high risk for further problems with acute respiratory failure    LOS: 6 days   [name] L 07/15/2011, 10:27 AM

## 2011-07-16 ENCOUNTER — Encounter (HOSPITAL_COMMUNITY): Payer: Medicaid Other

## 2011-07-16 LAB — BLOOD GAS, ARTERIAL
Acid-Base Excess: 15.5 mmol/L — ABNORMAL HIGH (ref 0.0–2.0)
Acid-Base Excess: 17.5 mmol/L — ABNORMAL HIGH (ref 0.0–2.0)
Acid-base deficit: 13.1 mmol/L — ABNORMAL HIGH (ref 0.0–2.0)
Bicarbonate: 42.5 mEq/L — ABNORMAL HIGH (ref 20.0–24.0)
Bicarbonate: 43 mEq/L — ABNORMAL HIGH (ref 20.0–24.0)
Delivery systems: POSITIVE
FIO2: 0.3 %
O2 Content: 30 L/min
O2 Saturation: 95 %
Patient temperature: 37
TCO2: 39.7 mmol/L (ref 0–100)
TCO2: 38.6 mmol/L (ref 0–100)
pO2, Arterial: 91 mmHg (ref 80.0–100.0)

## 2011-07-16 LAB — GLUCOSE, CAPILLARY
Glucose-Capillary: 125 mg/dL — ABNORMAL HIGH (ref 70–99)
Glucose-Capillary: 164 mg/dL — ABNORMAL HIGH (ref 70–99)
Glucose-Capillary: 107 mg/dL — ABNORMAL HIGH (ref 70–99)
Glucose-Capillary: 122 mg/dL — ABNORMAL HIGH (ref 70–99)
Glucose-Capillary: 128 mg/dL — ABNORMAL HIGH (ref 70–99)
Glucose-Capillary: 104 mg/dL — ABNORMAL HIGH (ref 70–99)
Glucose-Capillary: 100 mg/dL — ABNORMAL HIGH (ref 70–99)

## 2011-07-16 LAB — BASIC METABOLIC PANEL
BUN: 17 mg/dL (ref 6–23)
Chloride: 92 mEq/L — ABNORMAL LOW (ref 96–112)
Creatinine, Ser: 0.92 mg/dL (ref 0.50–1.10)
GFR calc Af Amer: 79 mL/min — ABNORMAL LOW (ref >90)
Glucose, Bld: 138 mg/dL — ABNORMAL HIGH (ref 70–99)

## 2011-07-16 MED ORDER — SODIUM CHLORIDE 0.9 % IJ SOLN
INTRAMUSCULAR | Status: AC
  Administered 2011-07-16: 10 mL
  Filled 2011-07-16: qty 3

## 2011-07-16 MED ORDER — HYDRALAZINE HCL 25 MG PO TABS
100.0000 mg | ORAL_TABLET | Freq: Three times a day (TID) | ORAL | Status: DC
  Administered 2011-07-16 – 2011-07-18 (×5): 100 mg via ORAL
  Filled 2011-07-16 (×5): qty 4

## 2011-07-16 NOTE — Progress Notes (Signed)
Subjective: Patient is somnolent, often falling asleep in conversation, reports harder to breathe today  Objective: Vital signs in last 24 hours: Temp:  [98.1 F (36.7 C)-98.5 F (36.9 C)] 98.1 F (36.7 C) (02/25 0509) Pulse Rate:  [70-72] 72  (02/25 0509) Resp:  [20-24] 20  (02/25 0509) BP: (108-189)/(81-93) 108/81 mmHg (02/25 0509) SpO2:  [91 %-99 %] 97 % (02/25 0756) Weight change:  Last BM Date: 07/15/11  Intake/Output from previous day: 02/24 0701 - 02/25 0700 In: 1623 [P.O.:120; I.V.:3; IV Piggyback:1500] Out: 1800 [Urine:1800]     Physical Exam: General: somnolent, , in no acute distress. HEENT: No bruits, no goiter. Heart: Regular rate and rhythm, without murmurs, rubs, gallops. Lungs: mild wheeze b/l. Short shallow breaths Abdomen: Soft, nontender, nondistended, positive bowel sounds. Extremities: cellulitis on Right thigh is improving Neuro: Grossly intact, nonfocal.    Lab Results: Basic Metabolic Panel:  Basename 07/16/11 0525 07/15/11 0648  NA 140 138  K 4.1 4.4  CL 92* 94*  CO2 43* 39*  GLUCOSE 138* 141*  BUN 17 17  CREATININE 0.92 1.10  CALCIUM 9.9 9.6  MG -- --  PHOS -- --   Liver Function Tests: No results found for this basename: AST:2,ALT:2,ALKPHOS:2,BILITOT:2,PROT:2,ALBUMIN:2 in the last 72 hours No results found for this basename: LIPASE:2,AMYLASE:2 in the last 72 hours No results found for this basename: AMMONIA:2 in the last 72 hours CBC:  Basename 07/15/11 0648  WBC 10.0  NEUTROABS --  HGB 8.3*  HCT 28.3*  MCV 94.6  PLT 270   Cardiac Enzymes: No results found for this basename: CKTOTAL:3,CKMB:3,CKMBINDEX:3,TROPONINI:3 in the last 72 hours BNP: No results found for this basename: PROBNP:3 in the last 72 hours D-Dimer: No results found for this basename: DDIMER:2 in the last 72 hours CBG:  Basename 07/16/11 1138 07/16/11 0742 07/15/11 2145 07/15/11 0815 07/14/11 1730 07/14/11 1058  GLUCAP 128* 122* 127* 143* 143* 135*    Hemoglobin A1C: No results found for this basename: HGBA1C in the last 72 hours Fasting Lipid Panel: No results found for this basename: CHOL,HDL,LDLCALC,TRIG,CHOLHDL,LDLDIRECT in the last 72 hours Thyroid Function Tests: No results found for this basename: TSH,T4TOTAL,FREET4,T3FREE,THYROIDAB in the last 72 hours Anemia Panel: No results found for this basename: VITAMINB12,FOLATE,FERRITIN,TIBC,IRON,RETICCTPCT in the last 72 hours Coagulation: No results found for this basename: LABPROT:2,INR:2 in the last 72 hours Urine Drug Screen: Drugs of Abuse     Component Value Date/Time   LABOPIA NEGATIVE 02/25/2009 1755   LABOPIA NONE DETECTED 06/05/2007 1610   COCAINSCRNUR NEGATIVE 02/25/2009 1755   COCAINSCRNUR NONE DETECTED 06/05/2007 1610   LABBENZ NEGATIVE 02/25/2009 1755   LABBENZ NONE DETECTED 06/05/2007 1610   AMPHETMU NEGATIVE 02/25/2009 1755   AMPHETMU NONE DETECTED 06/05/2007 1610   THCU NONE DETECTED 06/05/2007 1610   LABBARB  Value: NONE DETECTED        DRUG SCREEN FOR MEDICAL PURPOSES ONLY.  IF CONFIRMATION IS NEEDED FOR ANY PURPOSE, NOTIFY LAB WITHIN 5 DAYS. 06/05/2007 1610    Alcohol Level: No results found for this basename: ETH:2 in the last 72 hours Urinalysis: No results found for this basename: COLORURINE:2,APPERANCEUR:2,LABSPEC:2,PHURINE:2,GLUCOSEU:2,HGBUR:2,BILIRUBINUR:2,KETONESUR:2,PROTEINUR:2,UROBILINOGEN:2,NITRITE:2,LEUKOCYTESUR:2 in the last 72 hours  Recent Results (from the past 240 hour(s))  MRSA PCR SCREENING     Status: Normal   Collection Time   07/10/11  9:37 AM      Component Value Range Status Comment   MRSA by PCR NEGATIVE  NEGATIVE  Final     Studies/Results: No results found.  Medications: Scheduled Meds:   .  antiseptic oral rinse  15 mL Mouth Rinse QID  . azithromycin  500 mg Intravenous Q24H  . ceFTAROline (TEFLARO) IV  600 mg Intravenous Q12H  . enoxaparin  90 mg Subcutaneous Daily  . feeding supplement  237 mL Oral q morning - 10a  .  furosemide  20 mg Oral BID  . insulin aspart  0-20 Units Subcutaneous TID WC  . insulin glargine  30 Units Subcutaneous Daily  . nystatin   Topical TID  . pantoprazole  40 mg Oral Q1200  . sodium chloride      . sodium chloride      . sodium chloride      . sodium chloride       Continuous Infusions:  PRN Meds:.acetaminophen, acetaminophen, albuterol, hydrALAZINE, ondansetron (ZOFRAN) IV, ondansetron, traMADol, DISCONTD: albuterol, DISCONTD: oxyCODONE  Assessment/Plan:  1. Acute on chronic respiratory failure, multifactorial.  Patient is noted to have a declining mental status today.  Spoke to respiratory therapist who knows the patient from past experiences and was told that patient has continued to decline. ABG shows increasing PCO2, and pH 7.3.  She has already been intubated on admission during this admission, and I am worried she may be headed there again.  We will transfer her to the step down unit for bipap therapy.  Due to her OHS/OSA/COPD, she will likely need nightly bipap therapy.  All of her narcotics have been discontinued. Her pneumonia seems to be improving.  She is continued on albuterol nebs.  With her chronic medical issues leading to her current state, patient may be a good candidate for LTAC to help further stabilize her respiratory failure.  2. Pneumonia, improving, today is day 7.  She is afebrile.  Will discontinue after today's dose  3.  Morbid obesity, OHS, OSA  4. Diabetes Mellitus, stable  5. Anemia, stable, hemoglobin at baseline  6. Dispo.  Currently she will be transferred down to the SDU for Bipap therapy.  We will follow serial ABGs.  Will ask LTAC evaluation. Explained plan to family at bedside.    LOS: 7 days   Carollee Nussbaumer [facility] Pager: [phone] 07/16/2011, 11:42 AM

## 2011-07-16 NOTE — Progress Notes (Signed)
CARE MANAGEMENT NOTE 07/16/2011  Patient:  [name], [name]   Account Number:  [id number]  Date Initiated:  07/13/2011  Documentation initiated by:  Rosemary Holms  Subjective/Objective Assessment:   pt admitted with leg pain. PTA lived at home with SO. Was currently with [facility].     Action/Plan:   07/16/11 Pt not progressing as wished. Referred to [facility] and was turned down due to self pay status. [facility] asked to call and access   Anticipated DC Date:  07/19/2011   Anticipated DC Plan:  LONG TERM ACUTE CARE (LTAC)  In-house referral  Financial Counselor      DC Planning Services  CM consult      Choice offered to / List presented to:             [facility] agency  [facility]   Status of service:  In process, will continue to follow Medicare Important Message given?   (If response is "NO", the following Medicare IM given date fields will be blank) Date Medicare IM given:   Date Additional Medicare IM given:    Discharge Disposition:    Per UR Regulation:    Comments:  07/16/11 1300 Kyandra Mcclaine RN BSN CM  07/13/11 1400 Keerstin Bjelland Leanord Hawking RN BSN

## 2011-07-16 NOTE — OR Nursing (Signed)
Unable to access for PICC line, would not thread line through cephalic  Unable to visualize basilic or bracial

## 2011-07-16 NOTE — Progress Notes (Signed)
Called report to Kathyrn Sheriff, RN in ICU.  Verbalized understanding. Pt transferred to ICU-08.  Schonewitz, Candelaria Stagers 07/16/2011

## 2011-07-16 NOTE — Progress Notes (Signed)
Subjective: She is about the same. She says she's having some trouble with arthritis in her leg. Her breathing is doing okay  Objective: Vital signs in last 24 hours: Temp:  [98.1 F (36.7 C)-98.5 F (36.9 C)] 98.1 F (36.7 C) (02/25 0509) Pulse Rate:  [70-72] 72  (02/25 0509) Resp:  [20-24] 20  (02/25 0509) BP: (108-189)/(81-93) 108/81 mmHg (02/25 0509) SpO2:  [91 %-99 %] 97 % (02/25 0756) Weight change:  Last BM Date: 07/15/11  Intake/Output from previous day: 02/24 0701 - 02/25 0700 In: 1623 [P.O.:120; I.V.:3; IV Piggyback:1500] Out: 1800 [Urine:1800]  PHYSICAL EXAM General appearance: alert, cooperative and morbidly obese Resp: diminished breath sounds bilaterally Cardio: regular rate and rhythm, S1, S2 normal, no murmur, click, rub or gallop GI: soft, non-tender; bowel sounds normal; no masses,  no organomegaly Extremities: extremities normal, atraumatic, no cyanosis or edema  Lab Results:    Basic Metabolic Panel:  Basename 07/16/11 0525 07/15/11 0648  NA 140 138  K 4.1 4.4  CL 92* 94*  CO2 43* 39*  GLUCOSE 138* 141*  BUN 17 17  CREATININE 0.92 1.10  CALCIUM 9.9 9.6  MG -- --  PHOS -- --   Liver Function Tests: No results found for this basename: AST:2,ALT:2,ALKPHOS:2,BILITOT:2,PROT:2,ALBUMIN:2 in the last 72 hours No results found for this basename: LIPASE:2,AMYLASE:2 in the last 72 hours No results found for this basename: AMMONIA:2 in the last 72 hours CBC:  Basename 07/15/11 0648  WBC 10.0  NEUTROABS --  HGB 8.3*  HCT 28.3*  MCV 94.6  PLT 270   Cardiac Enzymes: No results found for this basename: CKTOTAL:3,CKMB:3,CKMBINDEX:3,TROPONINI:3 in the last 72 hours BNP: No results found for this basename: PROBNP:3 in the last 72 hours D-Dimer: No results found for this basename: DDIMER:2 in the last 72 hours CBG:  Basename 07/16/11 0742 07/15/11 2145 07/15/11 0815 07/14/11 1730 07/14/11 1058 07/14/11 0745  GLUCAP 122* 127* 143* 143* 135* 123*    Hemoglobin A1C: No results found for this basename: HGBA1C in the last 72 hours Fasting Lipid Panel: No results found for this basename: CHOL,HDL,LDLCALC,TRIG,CHOLHDL,LDLDIRECT in the last 72 hours Thyroid Function Tests: No results found for this basename: TSH,T4TOTAL,FREET4,T3FREE,THYROIDAB in the last 72 hours Anemia Panel: No results found for this basename: VITAMINB12,FOLATE,FERRITIN,TIBC,IRON,RETICCTPCT in the last 72 hours Coagulation: No results found for this basename: LABPROT:2,INR:2 in the last 72 hours Urine Drug Screen: Drugs of Abuse     Component Value Date/Time   LABOPIA NEGATIVE 02/25/2009 1755   LABOPIA NONE DETECTED 06/05/2007 1610   COCAINSCRNUR NEGATIVE 02/25/2009 1755   COCAINSCRNUR NONE DETECTED 06/05/2007 1610   LABBENZ NEGATIVE 02/25/2009 1755   LABBENZ NONE DETECTED 06/05/2007 1610   AMPHETMU NEGATIVE 02/25/2009 1755   AMPHETMU NONE DETECTED 06/05/2007 1610   THCU NONE DETECTED 06/05/2007 1610   LABBARB  Value: NONE DETECTED        DRUG SCREEN FOR MEDICAL PURPOSES ONLY.  IF CONFIRMATION IS NEEDED FOR ANY PURPOSE, NOTIFY LAB WITHIN 5 DAYS. 06/05/2007 1610    Alcohol Level: No results found for this basename: ETH:2 in the last 72 hours Urinalysis: No results found for this basename: COLORURINE:2,APPERANCEUR:2,LABSPEC:2,PHURINE:2,GLUCOSEU:2,HGBUR:2,BILIRUBINUR:2,KETONESUR:2,PROTEINUR:2,UROBILINOGEN:2,NITRITE:2,LEUKOCYTESUR:2 in the last 72 hours Misc. Labs:  ABGS No results found for this basename: PHART,PCO2,PO2ART,TCO2,HCO3 in the last 72 hours CULTURES Recent Results (from the past 240 hour(s))  MRSA PCR SCREENING     Status: Normal   Collection Time   07/10/11  9:37 AM      Component Value Range Status Comment  MRSA by PCR NEGATIVE  NEGATIVE  Final    Studies/Results: No results found.  Medications:  Scheduled:   . antiseptic oral rinse  15 mL Mouth Rinse QID  . azithromycin  500 mg Intravenous Q24H  . ceFTAROline (TEFLARO) IV  600 mg Intravenous  Q12H  . enoxaparin  90 mg Subcutaneous Daily  . feeding supplement  237 mL Oral q morning - 10a  . furosemide  20 mg Oral BID  . insulin aspart  0-20 Units Subcutaneous TID WC  . insulin glargine  30 Units Subcutaneous Daily  . nystatin   Topical TID  . pantoprazole  40 mg Oral Q1200  . sodium chloride      . sodium chloride      . sodium chloride      . sodium chloride       Continuous:  ZOX:[id number], acetaminophen, albuterol, hydrALAZINE, ondansetron (ZOFRAN) IV, ondansetron, traMADol, DISCONTD: albuterol, DISCONTD: oxyCODONE  Assesment: She has morbid obesity obesity hypoventilation had acute respiratory failure which is better she has cellulitis of the leg. Principal Problem:  *Cellulitis of leg Active Problems:  DIABETES MELLITUS, TYPE II  OBESITY, MORBID  Anemia, iron deficiency  SLEEP APNEA, OBSTRUCTIVE  HYPERTENSION  PACEMAKER, PERMANENT    Plan: She's being set up for CPAP/BiPAP at home I believe. Otherwise I would continue with her current treatments.    LOS: 7 days   Litisha Guagliardo L 07/16/2011, 8:42 AM

## 2011-07-16 NOTE — Progress Notes (Addendum)
Pt off of CPAP now, she only wears for a few hours at a time. She wore CPAP for 4 hours ,nasal mask , auto titrate set for 6 - 12 CPAP bled into mask.Marland Kitchen

## 2011-07-17 LAB — BLOOD GAS, ARTERIAL
O2 Content: 2 L/min
O2 Saturation: 96.4 %
pO2, Arterial: 97.5 mmHg (ref 80.0–100.0)

## 2011-07-17 LAB — GLUCOSE, CAPILLARY
Glucose-Capillary: 93 mg/dL (ref 70–99)
Glucose-Capillary: 140 mg/dL — ABNORMAL HIGH (ref 70–99)

## 2011-07-17 MED ORDER — ENOXAPARIN SODIUM 100 MG/ML ~~LOC~~ SOLN
90.0000 mg | Freq: Every day | SUBCUTANEOUS | Status: DC
  Administered 2011-07-17 – 2011-07-18 (×2): 90 mg via SUBCUTANEOUS
  Filled 2011-07-17 (×2): qty 1

## 2011-07-17 MED ORDER — NYSTATIN 100000 UNIT/GM EX POWD
CUTANEOUS | Status: AC
  Filled 2011-07-17: qty 15

## 2011-07-17 MED ORDER — FLECAINIDE ACETATE 100 MG PO TABS
100.0000 mg | ORAL_TABLET | Freq: Two times a day (BID) | ORAL | Status: DC
  Administered 2011-07-17 – 2011-07-18 (×3): 100 mg via ORAL
  Filled 2011-07-17 (×7): qty 1

## 2011-07-17 MED ORDER — CARVEDILOL 12.5 MG PO TABS
12.5000 mg | ORAL_TABLET | Freq: Two times a day (BID) | ORAL | Status: DC
  Administered 2011-07-17 – 2011-07-18 (×3): 12.5 mg via ORAL
  Filled 2011-07-17 (×3): qty 1

## 2011-07-17 NOTE — Progress Notes (Signed)
Subjective: Patient feels significantly better since she has been on the bipap, she does not have any significant shortness of breath or pain.  Objective: Vital signs in last 24 hours: Temp:  [98 F (36.7 C)-98.7 F (37.1 C)] 98 F (36.7 C) (02/26 0700) Pulse Rate:  [58-93] 82  (02/26 0752) Resp:  [20-26] 20  (02/26 0752) BP: (146-211)/(82-128) 170/82 mmHg (02/26 0752) SpO2:  [87 %-96 %] 92 % (02/26 0752) FiO2 (%):  [30 %] 30 % (02/26 0400) Weight:  [180.85 kg (398 lb 11.2 oz)] 180.85 kg (398 lb 11.2 oz) (02/26 0600) Weight change:  Last BM Date: 07/16/11  Intake/Output from previous day: 02/25 0701 - 02/26 0700 In: 250 [IV Piggyback:250] Out: 3900 [Urine:3900]     Physical Exam: General: Alert, awake, oriented x3, in no acute distress, morbidly obese HEENT: No bruits, no goiter. Heart: Regular rate and rhythm, without murmurs, rubs, gallops. Lungs: diminished breath sounds b/l. Abdomen: Soft, nontender, nondistended, positive bowel sounds. Extremities: erythema in RLE improving, she has chronic b/l LE edema Neuro: Grossly intact, nonfocal.    Lab Results: Basic Metabolic Panel:  Basename 07/16/11 0525 07/15/11 0648  NA 140 138  K 4.1 4.4  CL 92* 94*  CO2 43* 39*  GLUCOSE 138* 141*  BUN 17 17  CREATININE 0.92 1.10  CALCIUM 9.9 9.6  MG -- --  PHOS -- --   Liver Function Tests: No results found for this basename: AST:2,ALT:2,ALKPHOS:2,BILITOT:2,PROT:2,ALBUMIN:2 in the last 72 hours No results found for this basename: LIPASE:2,AMYLASE:2 in the last 72 hours No results found for this basename: AMMONIA:2 in the last 72 hours CBC:  Basename 07/15/11 0648  WBC 10.0  NEUTROABS --  HGB 8.3*  HCT 28.3*  MCV 94.6  PLT 270   Cardiac Enzymes: No results found for this basename: CKTOTAL:3,CKMB:3,CKMBINDEX:3,TROPONINI:3 in the last 72 hours BNP: No results found for this basename: PROBNP:3 in the last 72 hours D-Dimer: No results found for this basename:  DDIMER:2 in the last 72 hours CBG:  Basename 07/17/11 0742 07/16/11 2205 07/16/11 1612 07/16/11 1138 07/16/11 0742 07/15/11 2145  GLUCAP 93 100* 104* 128* 122* 127*   Hemoglobin A1C: No results found for this basename: HGBA1C in the last 72 hours Fasting Lipid Panel: No results found for this basename: CHOL,HDL,LDLCALC,TRIG,CHOLHDL,LDLDIRECT in the last 72 hours Thyroid Function Tests: No results found for this basename: TSH,T4TOTAL,FREET4,T3FREE,THYROIDAB in the last 72 hours Anemia Panel: No results found for this basename: VITAMINB12,FOLATE,FERRITIN,TIBC,IRON,RETICCTPCT in the last 72 hours Coagulation: No results found for this basename: LABPROT:2,INR:2 in the last 72 hours Urine Drug Screen: Drugs of Abuse     Component Value Date/Time   LABOPIA NEGATIVE 02/25/2009 1755   LABOPIA NONE DETECTED 06/05/2007 1610   COCAINSCRNUR NEGATIVE 02/25/2009 1755   COCAINSCRNUR NONE DETECTED 06/05/2007 1610   LABBENZ NEGATIVE 02/25/2009 1755   LABBENZ NONE DETECTED 06/05/2007 1610   AMPHETMU NEGATIVE 02/25/2009 1755   AMPHETMU NONE DETECTED 06/05/2007 1610   THCU NONE DETECTED 06/05/2007 1610   LABBARB  Value: NONE DETECTED        DRUG SCREEN FOR MEDICAL PURPOSES ONLY.  IF CONFIRMATION IS NEEDED FOR ANY PURPOSE, NOTIFY LAB WITHIN 5 DAYS. 06/05/2007 1610    Alcohol Level: No results found for this basename: ETH:2 in the last 72 hours Urinalysis: No results found for this basename: COLORURINE:2,APPERANCEUR:2,LABSPEC:2,PHURINE:2,GLUCOSEU:2,HGBUR:2,BILIRUBINUR:2,KETONESUR:2,PROTEINUR:2,UROBILINOGEN:2,NITRITE:2,LEUKOCYTESUR:2 in the last 72 hours  Recent Results (from the past 240 hour(s))  MRSA PCR SCREENING     Status: Normal   Collection Time  07/10/11  9:37 AM      Component Value Range Status Comment   MRSA by PCR NEGATIVE  NEGATIVE  Final     Studies/Results: No results found.  Medications: Scheduled Meds:   . antiseptic oral rinse  15 mL Mouth Rinse QID  . enoxaparin  90 mg  Subcutaneous Daily  . feeding supplement  237 mL Oral q morning - 10a  . furosemide  20 mg Oral BID  . hydrALAZINE  100 mg Oral Q8H  . insulin aspart  0-20 Units Subcutaneous TID WC  . insulin glargine  30 Units Subcutaneous Daily  . nystatin   Topical TID  . pantoprazole  40 mg Oral Q1200  . DISCONTD: azithromycin  500 mg Intravenous Q24H  . DISCONTD: ceFTAROline (TEFLARO) IV  600 mg Intravenous Q12H   Continuous Infusions:  PRN Meds:.acetaminophen, acetaminophen, albuterol, hydrALAZINE, ondansetron (ZOFRAN) IV, ondansetron, traMADol  Assessment/Plan: This lady was admitted with a cellulitis, but then decompensated in the ED after receiving some IV dilaudid. Chest xray was suggestive of L pneumonia.  She required intubation and mechanical ventilation.  She was subsequently weaned off the vent and was transitioned to the medical floor.  She was continued on antibiotics for pneumonia and cellulitis and both of those processes appeared to have improved. She became subsequently started to become increasingly lethargic and somnolent.  ABG was checked again and patient had an increasing PCO2.  She was then transferred back to the SDU for bipap.  1. Acute on chronic respiratory failure, multifactorial. Patient is significantly improved after bipap therapy.  Currently she is on 2L [place].  Per RT, review of records show that patient generally does well on 2-2.5L [place].  More recently she was on 4L, which was likely elevating her PO2 too high and depressing her resp drive. We will monitor off the bipap today and recheck an abg in the evening.  Patient will need to be on nightly bipap therapy at home.  This is being set up through home health services.  Appreciate Dr. Juanetta Gosling assistance.  2. Pneumonia, improved, treated with 7 days of IV antibiotics.  3. Cellulitis of Right leg, Improved on antibiotics  4. HTN, she was initially on coreg prior to admission.  This will be restarted.  Will also restart  outpatient dosing of flecainide.  5. Morbid obesity, OHS, OSA, leading to major respiratory issues and comorbidities.  Counseled patient on importance of diet   6. Diabetes Mellitus, stable   7. Anemia, stable, hemoglobin at baseline   8. Dispo. Plan will be to eventually return home in the care of her family.  She is essentially bed bound due to he weight. Bipap will need to be set up prior to discharge.  Anticipate discharge home in next 24-48hours.    LOS: 8 days   Tiersa Dayley [facility] Pager: [phone] 07/17/2011, 9:52 AM

## 2011-07-17 NOTE — Progress Notes (Signed)
Subjective: Her blood gases were much worse yesterday although she was somewhat awake. She has been on BiPAP. She is arousable this morning. Her blood pressure has been a problem as well. Her daughter has requested that she have Port-A-Cath placed  Objective: Vital signs in last 24 hours: Temp:  [98.3 F (36.8 C)-98.7 F (37.1 C)] 98.4 F (36.9 C) (02/26 0600) Pulse Rate:  [58-93] 80  (02/26 0600) Resp:  [20-26] 25  (02/26 0600) BP: (146-211)/(96-128) 170/99 mmHg (02/26 0600) SpO2:  [87 %-97 %] 92 % (02/26 0600) FiO2 (%):  [30 %] 30 % (02/26 0400) Weight:  [180.85 kg (398 lb 11.2 oz)] 180.85 kg (398 lb 11.2 oz) (02/26 0600) Weight change:  Last BM Date: 07/16/11  Intake/Output from previous day: 02/25 0701 - 02/26 0700 In: 250 [IV Piggyback:250] Out: 3900 [Urine:3900]  PHYSICAL EXAM General appearance: alert, moderate distress and morbidly obese Resp: diminished breath sounds bilaterally Cardio: regular rate and rhythm, S1, S2 normal, no murmur, click, rub or gallop GI: soft, non-tender; bowel sounds normal; no masses,  no organomegaly Extremities: venous stasis dermatitis noted  Lab Results:    Basic Metabolic Panel:  Basename 07/16/11 0525 07/15/11 0648  NA 140 138  K 4.1 4.4  CL 92* 94*  CO2 43* 39*  GLUCOSE 138* 141*  BUN 17 17  CREATININE 0.92 1.10  CALCIUM 9.9 9.6  MG -- --  PHOS -- --   Liver Function Tests: No results found for this basename: AST:2,ALT:2,ALKPHOS:2,BILITOT:2,PROT:2,ALBUMIN:2 in the last 72 hours No results found for this basename: LIPASE:2,AMYLASE:2 in the last 72 hours No results found for this basename: AMMONIA:2 in the last 72 hours CBC:  Basename 07/15/11 0648  WBC 10.0  NEUTROABS --  HGB 8.3*  HCT 28.3*  MCV 94.6  PLT 270   Cardiac Enzymes: No results found for this basename: CKTOTAL:3,CKMB:3,CKMBINDEX:3,TROPONINI:3 in the last 72 hours BNP: No results found for this basename: PROBNP:3 in the last 72 hours D-Dimer: No  results found for this basename: DDIMER:2 in the last 72 hours CBG:  Basename 07/16/11 2205 07/16/11 1612 07/16/11 1138 07/16/11 0742 07/15/11 2145 07/15/11 1725  GLUCAP 100* 104* 128* 122* 127* 107*   Hemoglobin A1C: No results found for this basename: HGBA1C in the last 72 hours Fasting Lipid Panel: No results found for this basename: CHOL,HDL,LDLCALC,TRIG,CHOLHDL,LDLDIRECT in the last 72 hours Thyroid Function Tests: No results found for this basename: TSH,T4TOTAL,FREET4,T3FREE,THYROIDAB in the last 72 hours Anemia Panel: No results found for this basename: VITAMINB12,FOLATE,FERRITIN,TIBC,IRON,RETICCTPCT in the last 72 hours Coagulation: No results found for this basename: LABPROT:2,INR:2 in the last 72 hours Urine Drug Screen: Drugs of Abuse     Component Value Date/Time   LABOPIA NEGATIVE 02/25/2009 1755   LABOPIA NONE DETECTED 06/05/2007 1610   COCAINSCRNUR NEGATIVE 02/25/2009 1755   COCAINSCRNUR NONE DETECTED 06/05/2007 1610   LABBENZ NEGATIVE 02/25/2009 1755   LABBENZ NONE DETECTED 06/05/2007 1610   AMPHETMU NEGATIVE 02/25/2009 1755   AMPHETMU NONE DETECTED 06/05/2007 1610   THCU NONE DETECTED 06/05/2007 1610   LABBARB  Value: NONE DETECTED        DRUG SCREEN FOR MEDICAL PURPOSES ONLY.  IF CONFIRMATION IS NEEDED FOR ANY PURPOSE, NOTIFY LAB WITHIN 5 DAYS. 06/05/2007 1610    Alcohol Level: No results found for this basename: ETH:2 in the last 72 hours Urinalysis: No results found for this basename: COLORURINE:2,APPERANCEUR:2,LABSPEC:2,PHURINE:2,GLUCOSEU:2,HGBUR:2,BILIRUBINUR:2,KETONESUR:2,PROTEINUR:2,UROBILINOGEN:2,NITRITE:2,LEUKOCYTESUR:2 in the last 72 hours Misc. Labs:  ABGS  Basename 07/16/11 1705  PHART 7.435*  PO2ART 71.0*  TCO2  38.6  HCO3 43.0*   CULTURES Recent Results (from the past 240 hour(s))  MRSA PCR SCREENING     Status: Normal   Collection Time   07/10/11  9:37 AM      Component Value Range Status Comment   MRSA by PCR NEGATIVE  NEGATIVE  Final     Studies/Results: No results found.  Medications:  Scheduled:   . antiseptic oral rinse  15 mL Mouth Rinse QID  . enoxaparin  90 mg Subcutaneous Daily  . feeding supplement  237 mL Oral q morning - 10a  . furosemide  20 mg Oral BID  . hydrALAZINE  100 mg Oral Q8H  . insulin aspart  0-20 Units Subcutaneous TID WC  . insulin glargine  30 Units Subcutaneous Daily  . nystatin   Topical TID  . pantoprazole  40 mg Oral Q1200  . DISCONTD: azithromycin  500 mg Intravenous Q24H  . DISCONTD: ceFTAROline (TEFLARO) IV  600 mg Intravenous Q12H   Continuous:  JYN:[id number], acetaminophen, albuterol, hydrALAZINE, ondansetron (ZOFRAN) IV, ondansetron, traMADol  Assesment: She has severe problems with obesity hypoventilation and chronic respiratory failure. I think she's going to require BiPAP at home. The biggest difficulty is that she has such severe obesity but she's really not able to move her much at all which makes things worse with obesity hypoventilation because of pooling of secretions ventilation perfusion mismatch etc. Principal Problem:  *Cellulitis of leg Active Problems:  DIABETES MELLITUS, TYPE II  OBESITY, MORBID  Anemia, iron deficiency  SLEEP APNEA, OBSTRUCTIVE  HYPERTENSION  PACEMAKER, PERMANENT    Plan: Once she is improved we can arrange for her to be on BiPAP at home    LOS: 8 days   [name] 07/17/2011, 7:33 AM

## 2011-07-17 NOTE — Progress Notes (Signed)
Notified Dr. Kerry Hough of patients BP 158/98 after Coreg dose. No new orders received. Will continue to monitor.

## 2011-07-17 NOTE — Progress Notes (Signed)
Utilization review completed.  

## 2011-07-17 NOTE — Progress Notes (Signed)
BP remained elevated throughout the night even after 100mg  hydralazine po TID  added from home med list.

## 2011-07-17 NOTE — Plan of Care (Signed)
Problem: Consults Goal: Respiratory Problems Patient Education See Patient Education Module for education specifics. Outcome: Progressing Patient transferred to higher level of care today, due to respiratory distress.  Bipap initiated.  PCO2 levels consistently coming down.  Monitoring closely Goal: Skin Care Protocol Initiated - if indicated If consults are not indicated, leave blank or document N/A Outcome: Progressing Turn patient Q2 hrs and prn for elimination needs Goal: Nutrition Consult-if indicated Outcome: Progressing Fair appetite  Problem: ICU Phase Progression Outcomes Goal: O2 sats trending toward baseline Outcome: Progressing Patient has Sleep Apnea, noted overnight on Bipap with cessation of breaths and decreased oxygen saturation.  Patient admitted to be diagnosed with it in the past.  Is not on CPAP at home Goal: Hemodynamically stable Outcome: Not Progressing BP continues to remain elevated.  Hydralazine reinstated tonight as per home use. 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  Tramadol 50 mg given tonight for pain Goal: Initial discharge plan identified Outcome: Completed/Met Date Met:  07/17/11 To home with family

## 2011-07-18 LAB — BASIC METABOLIC PANEL
BUN: 18 mg/dL (ref 6–23)
Calcium: 9.8 mg/dL (ref 8.4–10.5)
Creatinine, Ser: 0.93 mg/dL (ref 0.50–1.10)
GFR calc non Af Amer: 67 mL/min — ABNORMAL LOW (ref >90)
Glucose, Bld: 91 mg/dL (ref 70–99)
Sodium: 138 mEq/L (ref 135–145)

## 2011-07-18 LAB — GLUCOSE, CAPILLARY: Glucose-Capillary: 134 mg/dL — ABNORMAL HIGH (ref 70–99)

## 2011-07-18 LAB — CBC
MCH: 28.1 pg (ref 26.0–34.0)
MCHC: 31.6 g/dL (ref 30.0–36.0)
MCV: 89 fL (ref 78.0–100.0)
Platelets: 316 10*3/uL (ref 150–400)
RBC: 3.27 MIL/uL — ABNORMAL LOW (ref 3.87–5.11)
RDW: 15.8 % — ABNORMAL HIGH (ref 11.5–15.5)

## 2011-07-18 MED ORDER — CLONIDINE HCL 0.2 MG PO TABS
0.2000 mg | ORAL_TABLET | Freq: Two times a day (BID) | ORAL | Status: DC
  Administered 2011-07-18: 0.2 mg via ORAL
  Filled 2011-07-18: qty 1

## 2011-07-18 MED ORDER — TRAMADOL HCL 50 MG PO TABS: 50.0000 mg | ORAL_TABLET | Freq: Four times a day (QID) | ORAL | Status: AC | PRN

## 2011-07-18 NOTE — Progress Notes (Signed)
07/18/11 1043  BiPAP/CPAP/SIPAP  Mask Type Full face mask  Mask Size Large  Set Rate 14 breaths/min  Respiratory Rate 21 breaths/min  IPAP 20 cmH20  EPAP 8 cmH2O  Oxygen Percent 30 %  Minute Ventilation 20   Leak 22  Peak Inspiratory Pressure (PIP) 20   Tidal Volume (Vt) 375   BiPAP/CPAP/SIPAP BiPAP

## 2011-07-18 NOTE — Progress Notes (Signed)
Pt to be discharged home per MD order. Family at bedside and aware of discharge. Discharge instructions and prescriptions gone over with family and patients family members. All questions and concerns answered. [facility] to set up Bip[facility] at home. Family members aware. Waiting on EMS to pick up patient.

## 2011-07-18 NOTE — Progress Notes (Signed)
She has done well on BIPA.P. She did not tolerate CPAP and it was ineffective.she will need to be on BiPAP at home

## 2011-07-18 NOTE — Progress Notes (Signed)
Subjective: This lady is back in the intensive care unit requiring BiPAP usually in the evenings I think is appropriate. I hope that she will comply with this. We're awaiting [facility] to set up BiPAP at home. Otherwise, a cellulitis that she came in for seems to have cleared up. She has no specific complaints this morning.           Physical Exam: Blood pressure 194/115, pulse 72, temperature 97.7 F (36.5 C), temperature source Axillary, resp. rate 21, height 5\' 8"  (1.727 m), weight 180.85 kg (398 lb 11.2 oz), SpO2 98.00%. She looks systemically well. She is not toxic or septic. Lung fields anteriorly are clear. Her right leg cellulitis that she came in with is completely cleared now.   Investigations:  Recent Results (from the past 240 hour(s))  MRSA PCR SCREENING     Status: Normal   Collection Time   07/10/11  9:37 AM      Component Value Range Status Comment   MRSA by PCR NEGATIVE  NEGATIVE  Final      Basic Metabolic Panel:  Basename 07/18/11 0518 07/16/11 0525  NA 138 140  K 3.7 4.1  CL 88* 92*  CO2 43* 43*  GLUCOSE 91 138*  BUN 18 17  CREATININE 0.93 0.92  CALCIUM 9.8 9.9  MG -- --  PHOS -- --       CBC:  Basename 07/18/11 0518  WBC 9.8  NEUTROABS --  HGB 9.2*  HCT 29.1*  MCV 89.0  PLT 316        Medications: I have reviewed the patient's current medications.  Impression: 1. Cellulitis of the right leg, resolved. 2. Left pneumonia, clinically improved. 3. Morbid obesity with associated obstructive sleep apnea, requiring BiPAP from type II respiratory failure. She is very fragile and decompensates very quickly. 4. Hypertension, uncontrolled.     Plan: 1. Restart clonidine 0.2 mg twice a day that she was taking at home. 2. Await [facility] to set up BiPAP at home and then she can be discharged home.     LOS: 9 days   Wilson Singer Pager [phone]  07/18/2011, 7:29 AM

## 2011-07-18 NOTE — Progress Notes (Signed)
Subjective: She is overall about the same. She has been on BiPAP. She developed markedly increased PCO2 even while napping off the BiPAP so she will require BiPAP anytime she is asleep and when necessary.  Objective: Vital signs in last 24 hours: Temp:  [97.7 F (36.5 C)-97.9 F (36.6 C)] 97.7 F (36.5 C) (02/27 0400) Pulse Rate:  [65-96] 72  (02/27 0700) Resp:  [17-25] 21  (02/27 0700) BP: (135-207)/(81-126) 194/115 mmHg (02/27 0700) SpO2:  [91 %-98 %] 98 % (02/27 0700) FiO2 (%):  [30 %] 30 % (02/26 2000) Weight change:  Last BM Date: 07/16/11  Intake/Output from previous day: 02/26 0701 - 02/27 0700 In: 180 [P.O.:180] Out: 1700 [Urine:1700]  PHYSICAL EXAM General appearance: alert and morbidly obese Resp: diminished breath sounds bilaterally Cardio: regular rate and rhythm, S1, S2 normal, no murmur, click, rub or gallop GI: soft, non-tender; bowel sounds normal; no masses,  no organomegaly Extremities: The area of cellulitis appears to be improving  Lab Results:    Basic Metabolic Panel:  Basename 07/18/11 0518 07/16/11 0525  NA 138 140  K 3.7 4.1  CL 88* 92*  CO2 43* 43*  GLUCOSE 91 138*  BUN 18 17  CREATININE 0.93 0.92  CALCIUM 9.8 9.9  MG -- --  PHOS -- --   Liver Function Tests: No results found for this basename: AST:2,ALT:2,ALKPHOS:2,BILITOT:2,PROT:2,ALBUMIN:2 in the last 72 hours No results found for this basename: LIPASE:2,AMYLASE:2 in the last 72 hours No results found for this basename: AMMONIA:2 in the last 72 hours CBC:  Basename 07/18/11 0518  WBC 9.8  NEUTROABS --  HGB 9.2*  HCT 29.1*  MCV 89.0  PLT 316   Cardiac Enzymes: No results found for this basename: CKTOTAL:3,CKMB:3,CKMBINDEX:3,TROPONINI:3 in the last 72 hours BNP: No results found for this basename: PROBNP:3 in the last 72 hours D-Dimer: No results found for this basename: DDIMER:2 in the last 72 hours CBG:  Basename 07/17/11 2126 07/17/11 1709 07/17/11 1213 07/17/11 0742  07/16/11 2205 07/16/11 1612  GLUCAP 106* 140* 171* 93 100* 104*   Hemoglobin A1C: No results found for this basename: HGBA1C in the last 72 hours Fasting Lipid Panel: No results found for this basename: CHOL,HDL,LDLCALC,TRIG,CHOLHDL,LDLDIRECT in the last 72 hours Thyroid Function Tests: No results found for this basename: TSH,T4TOTAL,FREET4,T3FREE,THYROIDAB in the last 72 hours Anemia Panel: No results found for this basename: VITAMINB12,FOLATE,FERRITIN,TIBC,IRON,RETICCTPCT in the last 72 hours Coagulation: No results found for this basename: LABPROT:2,INR:2 in the last 72 hours Urine Drug Screen: Drugs of Abuse     Component Value Date/Time   LABOPIA NEGATIVE 02/25/2009 1755   LABOPIA NONE DETECTED 06/05/2007 1610   COCAINSCRNUR NEGATIVE 02/25/2009 1755   COCAINSCRNUR NONE DETECTED 06/05/2007 1610   LABBENZ NEGATIVE 02/25/2009 1755   LABBENZ NONE DETECTED 06/05/2007 1610   AMPHETMU NEGATIVE 02/25/2009 1755   AMPHETMU NONE DETECTED 06/05/2007 1610   THCU NONE DETECTED 06/05/2007 1610   LABBARB  Value: NONE DETECTED        DRUG SCREEN FOR MEDICAL PURPOSES ONLY.  IF CONFIRMATION IS NEEDED FOR ANY PURPOSE, NOTIFY LAB WITHIN 5 DAYS. 06/05/2007 1610    Alcohol Level: No results found for this basename: ETH:2 in the last 72 hours Urinalysis: No results found for this basename: COLORURINE:2,APPERANCEUR:2,LABSPEC:2,PHURINE:2,GLUCOSEU:2,HGBUR:2,BILIRUBINUR:2,KETONESUR:2,PROTEINUR:2,UROBILINOGEN:2,NITRITE:2,LEUKOCYTESUR:2 in the last 72 hours Misc. Labs:  ABGS  Basename 07/17/11 1625  PHART 7.328*  PO2ART 97.5  TCO2 39.9  HCO3 45.0*   CULTURES Recent Results (from the past 240 hour(s))  MRSA PCR SCREENING  Status: Normal   Collection Time   07/10/11  9:37 AM      Component Value Range Status Comment   MRSA by PCR NEGATIVE  NEGATIVE  Final    Studies/Results: No results found.  Medications:  Scheduled:   . antiseptic oral rinse  15 mL Mouth Rinse QID  . carvedilol  12.5 mg Oral  BID WC  . cloNIDine  0.2 mg Oral BID  . enoxaparin (LOVENOX) injection  90 mg Subcutaneous Daily  . feeding supplement  237 mL Oral q morning - 10a  . flecainide  100 mg Oral Q12H  . furosemide  20 mg Oral BID  . hydrALAZINE  100 mg Oral Q8H  . insulin aspart  0-20 Units Subcutaneous TID WC  . insulin glargine  30 Units Subcutaneous Daily  . nystatin   Topical TID  . pantoprazole  40 mg Oral Q1200  . DISCONTD: enoxaparin  90 mg Subcutaneous Daily   Continuous:  WUJ:[id number], acetaminophen, albuterol, hydrALAZINE, ondansetron (ZOFRAN) IV, ondansetron, traMADol  Assesment: She was admitted with cellulitis of the leg and developed acute on chronic respiratory failure. She has obesity hypoventilation/sleep apnea. Clearly she is going to require BiPAP at home. Her blood pressure has been up and Dr. Karilyn Cota has reinstituted some of her medications. I think she's probably ready for discharge once we can make arrangements for all the BiPAP et cetera. Principal Problem:  *Cellulitis of leg Active Problems:  DIABETES MELLITUS, TYPE II  OBESITY, MORBID  Anemia, iron deficiency  SLEEP APNEA, OBSTRUCTIVE  HYPERTENSION  PACEMAKER, PERMANENT    Plan: No change in medications at this point continue BiPAP anytime she is asleep.    LOS: 9 days   Jovon Winterhalter L 07/18/2011, 7:31 AM

## 2011-07-18 NOTE — Plan of Care (Signed)
Problem: Phase I Progression Outcomes Goal: O2 sats > or equal 90% or at baseline Outcome: Completed/Met Date Met:  07/18/11 With O2 via [place] or bipap

## 2011-07-18 NOTE — Discharge Summary (Signed)
Physician Discharge Summary  Patient ID: [name] MRN: [id number] DOB/AGE: 1955-07-13 56 y.o. Primary Care Physician:HILL,GERALD K, MD, MD Admit date: 07/09/2011 Discharge date: 07/18/2011    Discharge Diagnoses:  1. Cellulitis right leg, resolved. 2. Type II respiratory failure secondary to obstructive sleep apnea and obesity hypoventilation syndrome, compounded by left sided pneumonia, status post intubation and mechanical ventilation, discharge with BiPAP. 3. Morbid obesity. 4. Hypertension. 5. Type 2 diabetes mellitus.   Medication List  As of 07/18/2011  2:04 PM   TAKE these medications         albuterol (2.5 MG/3ML) 0.083% nebulizer solution   Commonly known as: PROVENTIL   Take 2.5 mg by nebulization every 6 (six) hours as needed. For shortness of breath      aspirin 81 MG EC tablet   Take 1 tablet (81 mg total) by mouth daily.      carvedilol 25 MG tablet   Commonly known as: COREG   Take 1 tablet (25 mg total) by mouth 2 (two) times daily with a meal.      cloNIDine 0.2 MG tablet   Commonly known as: CATAPRES   Take 0.2 mg by mouth 2 (two) times daily. **Takes two tablets at bedtime**      ferrous sulfate 325 (65 FE) MG tablet   Take 325 mg by mouth daily.      flecainide 100 MG tablet   Commonly known as: TAMBOCOR   Take 1 tablet (100 mg total) by mouth 2 (two) times daily.      furosemide 40 MG tablet   Commonly known as: LASIX   THE DOSE WAS DECREASED TO 1/2 TABLET 2 TIMES DAILY.      glipiZIDE 5 MG tablet   Commonly known as: GLUCOTROL   Take 5 mg by mouth 2 (two) times daily before a meal.      hydrALAZINE 100 MG tablet   Commonly known as: APRESOLINE   Take 100 mg by mouth 2 (two) times daily.      insulin lispro protamine-insulin lispro (75-25) 100 UNIT/ML Susp   Commonly known as: HUMALOG 75/25   Inject 10-15 Units into the skin 2 (two) times daily.      multivitamins ther. w/minerals Tabs   Take 1 tablet by mouth daily.     neomycin-bacitracin-polymyxin [phone] ointment   APPLY TO YOUR LEGS ON EVERY Tuesday, Thursday, AND Saturday.      nystatin cream   Commonly known as: MYCOSTATIN   APPLY TO YOUR SKIN FOLDS AND LEGS EVERY Monday, Wednesday , AND Friday.      spironolactone 25 MG tablet   Commonly known as: ALDACTONE   Take 1 tablet (25 mg total) by mouth daily.      traMADol 50 MG tablet   Commonly known as: ULTRAM   Take 1 tablet (50 mg total) by mouth every 6 (six) hours as needed.            Discharged Condition: Improved and stable.    Consults: Pulmonology, Dr. Juanetta Gosling.  Significant Diagnostic Studies: Dg Chest Port 1 View  07/11/2011  *RADIOLOGY REPORT*  Clinical Data: Respiratory failure  PORTABLE CHEST - 1 VIEW  Comparison: 07/10/2011  Findings: Cardiomegaly again noted.  Endotracheal tube in place with tip 3.3 cm above the carina.  NG tube in place.  Again noted patchy left perihilar and left basilar airspace suspicious for infiltrate/pneumonia.  No convincing pulmonary edema. Dual lead cardiac pacemaker is unchanged in position.  IMPRESSION: Cardiomegaly.Endotracheal tube  in place with tip 3.3 cm above the carina.  NG tube in place.  Again noted patchy left perihilar and left basilar airspace suspicious for infiltrate/pneumonia.  No convincing pulmonary edema.  Original Report Authenticated By: Natasha Mead, M.D.   Dg Chest Portable 1 View  07/10/2011  *RADIOLOGY REPORT*  Clinical Data: Post intubation  PORTABLE CHEST - 1 VIEW  Comparison: Portable chest x-ray of 02/26/2011  Findings: The tip of the endotracheal tube is approximately 2.9 cm above the carina.  Patchy airspace disease is noted bilaterally particularly in the left lung base suspicious for pneumonia. Cardiomegaly is stable.  A permanent pacemaker remains.  IMPRESSION:  1.  Endotracheal tube tip 2.9 cm above carina. 2.  Patchy airspace disease.  Possible pneumonia.  Original Report Authenticated By: Juline Patch, M.D.    Lab  Results: Basic Metabolic Panel:  Basename 07/18/11 0518 07/16/11 0525  NA 138 140  K 3.7 4.1  CL 88* 92*  CO2 43* 43*  GLUCOSE 91 138*  BUN 18 17  CREATININE 0.93 0.92  CALCIUM 9.8 9.9  MG -- --  PHOS -- --       CBC:  Basename 07/18/11 0518  WBC 9.8  NEUTROABS --  HGB 9.2*  HCT 29.1*  MCV 89.0  PLT 316    Recent Results (from the past 240 hour(s))  MRSA PCR SCREENING     Status: Normal   Collection Time   07/10/11  9:37 AM      Component Value Range Status Comment   MRSA by PCR NEGATIVE  NEGATIVE  Final      Hospital Course: This 56 year old lady presented to the hospital with symptoms of pain in redness in the right leg and thigh. It was felt she had a cellulitis and she was treated appropriately with intravenous antibiotics. She made good improvement from this. However, she decompensated from a respiratory standpoint in the emergency room when she was given 1 mg of hydromorphone intravenously. She required intubation and mechanical ventilation for a short period of time and was able to be then extubated and sent to the floor. Unfortunately, when she was on the floor she decompensated again, became drowsy and her PCO2 increased. She was therefore sent back to the intensive care unit where she was put on BiPAP. She has tolerated BiPAP well. Decision was made for her to go home and BiPAP which I think is highly appropriate in view of her morbid obesity, obstructive sleep apnea and obesity hypoventilation syndrome. She looks well today and her BiPAP has been now arranged to be [facility] at home [facility].  Discharge Exam: Blood pressure 156/95, pulse 65, temperature 97.7 F (36.5 C), temperature source Axillary, resp. rate 18, height 5\' 8"  (1.727 m), weight 180.85 kg (398 lb 11.2 oz), SpO2 98.00%. She is systemically well. She is not toxic or septic. Her right leg cellulitis has completely resolved. Heart sounds are present and normal. Lung fields are clinically clear  anteriorly. Obviously, she remains morbidly obese weighing close to 400 pounds. She is alert and orientated without any focal neurological signs.  Disposition: Home with BiPAP. I discussed at length with her regarding her nutrition and strategies to eat healthier food which will hopefully result in weight loss.  Discharge Orders    Future Orders Please Complete By Expires   Diet - low sodium heart healthy      Increase activity slowly         Follow-up Information  Schedule an appointment as soon as possible for a visit with HILL,GERALD K, MD. (As needed)       Follow up with [facility]. (At DC call [facility] to resume [facility] care. [phone])          SignedWilson Singer Pager [phone]  07/18/2011, 2:04 PM

## 2011-07-19 LAB — GLUCOSE, CAPILLARY: Glucose-Capillary: 99 mg/dL (ref 70–99)

## 2011-08-06 ENCOUNTER — Encounter: Payer: Self-pay | Admitting: Internal Medicine

## 2011-08-06 ENCOUNTER — Ambulatory Visit (INDEPENDENT_AMBULATORY_CARE_PROVIDER_SITE_OTHER): Payer: Self-pay | Admitting: *Deleted

## 2011-08-06 ENCOUNTER — Encounter: Payer: Medicaid Other | Admitting: *Deleted

## 2011-08-06 DIAGNOSIS — I442 Atrioventricular block, complete: Secondary | ICD-10-CM

## 2011-08-14 LAB — REMOTE PACEMAKER DEVICE
AL IMPEDENCE PM: 702 Ohm
AL THRESHOLD: 0.625 V
BATTERY VOLTAGE: 2.77 V
RV LEAD IMPEDENCE PM: 1070 Ohm
RV LEAD THRESHOLD: 0.625 V

## 2011-08-21 ENCOUNTER — Emergency Department (HOSPITAL_COMMUNITY)
Admission: EM | Admit: 2011-08-21 | Discharge: 2011-08-21 | Disposition: A | Discharge status: Home / Self Care | Payer: Self-pay | Attending: Emergency Medicine | Admitting: Emergency Medicine

## 2011-08-21 ENCOUNTER — Encounter (HOSPITAL_COMMUNITY): Payer: Self-pay | Admitting: Emergency Medicine

## 2011-08-21 DIAGNOSIS — E119 Type 2 diabetes mellitus without complications: Secondary | ICD-10-CM | POA: Insufficient documentation

## 2011-08-21 DIAGNOSIS — J4489 Other specified chronic obstructive pulmonary disease: Secondary | ICD-10-CM | POA: Insufficient documentation

## 2011-08-21 DIAGNOSIS — L02419 Cutaneous abscess of limb, unspecified: Secondary | ICD-10-CM | POA: Insufficient documentation

## 2011-08-21 DIAGNOSIS — Z79899 Other long term (current) drug therapy: Secondary | ICD-10-CM | POA: Insufficient documentation

## 2011-08-21 DIAGNOSIS — L03119 Cellulitis of unspecified part of limb: Secondary | ICD-10-CM | POA: Insufficient documentation

## 2011-08-21 DIAGNOSIS — I10 Essential (primary) hypertension: Secondary | ICD-10-CM | POA: Insufficient documentation

## 2011-08-21 DIAGNOSIS — I509 Heart failure, unspecified: Secondary | ICD-10-CM | POA: Insufficient documentation

## 2011-08-21 DIAGNOSIS — J449 Chronic obstructive pulmonary disease, unspecified: Secondary | ICD-10-CM | POA: Insufficient documentation

## 2011-08-21 DIAGNOSIS — L03115 Cellulitis of right lower limb: Secondary | ICD-10-CM

## 2011-08-21 DIAGNOSIS — M79609 Pain in unspecified limb: Secondary | ICD-10-CM | POA: Insufficient documentation

## 2011-08-21 DIAGNOSIS — R0602 Shortness of breath: Secondary | ICD-10-CM | POA: Insufficient documentation

## 2011-08-21 LAB — CBC
HCT: 31.5 % — ABNORMAL LOW (ref 36.0–46.0)
MCH: 27.6 pg (ref 26.0–34.0)
MCV: 87.7 fL (ref 78.0–100.0)
Platelets: 311 10*3/uL (ref 150–400)
RDW: 15.2 % (ref 11.5–15.5)

## 2011-08-21 LAB — BASIC METABOLIC PANEL
CO2: 31 mEq/L (ref 19–32)
Calcium: 9.4 mg/dL (ref 8.4–10.5)
Creatinine, Ser: 0.9 mg/dL (ref 0.50–1.10)
GFR calc non Af Amer: 70 mL/min — ABNORMAL LOW (ref >90)
Glucose, Bld: 149 mg/dL — ABNORMAL HIGH (ref 70–99)
Sodium: 139 mEq/L (ref 135–145)

## 2011-08-21 LAB — DIFFERENTIAL
Basophils Absolute: 0 10*3/uL (ref 0.0–0.1)
Eosinophils Absolute: 0.1 10*3/uL (ref 0.0–0.7)
Eosinophils Relative: 1 % (ref 0–5)
Lymphocytes Relative: 6 % — ABNORMAL LOW (ref 12–46)
Lymphs Abs: 1 10*3/uL (ref 0.7–4.0)
Monocytes Absolute: 0.8 10*3/uL (ref 0.1–1.0)

## 2011-08-21 MED ORDER — SULFAMETHOXAZOLE-TRIMETHOPRIM 800-160 MG PO TABS: 1.0000 | ORAL_TABLET | Freq: Two times a day (BID) | ORAL | Status: AC

## 2011-08-21 MED ORDER — VANCOMYCIN HCL IN DEXTROSE 1-5 GM/200ML-% IV SOLN
1000.0000 mg | Freq: Once | INTRAVENOUS | Status: AC
  Administered 2011-08-21: 1000 mg via INTRAVENOUS
  Filled 2011-08-21: qty 200

## 2011-08-21 MED ORDER — ACETAMINOPHEN 500 MG PO TABS
1000.0000 mg | ORAL_TABLET | Freq: Once | ORAL | Status: AC
  Administered 2011-08-21: 1000 mg via ORAL
  Filled 2011-08-21 (×2): qty 2

## 2011-08-21 NOTE — Progress Notes (Signed)
Remote pacer check  

## 2011-08-21 NOTE — Discharge Instructions (Signed)
Cellulitis Cellulitis is an infection of the tissue under the skin. The infected area is usually red and tender. This is caused by germs. These germs enter the body through cuts or sores. This usually happens in the arms or lower legs. HOME CARE   Take your medicine as told. Finish it even if you start to feel better.   If the infection is on the arm or leg, keep it raised (elevated).   Use a warm cloth on the infected area several times a day.   See your doctor for a follow-up visit as told.  GET HELP RIGHT AWAY IF:   You are tired or confused.   You throw up (vomit).   You have watery poop (diarrhea).   You feel ill and have muscle aches.   You have a fever.  MAKE SURE YOU:   Understand these instructions.   Will watch your condition.   Will get help right away if you are not doing well or get worse.  Document Released: 10/24/2007 Document Revised: 04/26/2011 Document Reviewed: 04/08/2009 [facility] Patient Information 2012 [place], [place].  Elevate leg. Antibiotic twice a day for 10 days.  Up your primary care Dr. Or return if worse.

## 2011-08-21 NOTE — ED Notes (Signed)
Pt with history of cellulitis to lower leg, states her lower leg is looking red, concerned with cellulitis starting again.   Pt also states her breathing is a little worse than usual

## 2011-08-22 NOTE — ED Provider Notes (Signed)
History     CSN: [id number]  Arrival date & time 08/21/11  3086   First MD Initiated Contact with Patient 08/21/11 0335      Chief Complaint  Patient presents with  . Leg Pain  . Shortness of Breath    (Consider location/radiation/quality/duration/timing/severity/associated sxs/prior treatment) HPI.  Redness posterior aspect of right thigh for several days. Afebrile. Has had cellulitis in her lower legs before. She usually responds well to by mouth antibiotics. No fever or chills. Palpation makes it worse. No radiation. Lambert Mody  Past Medical History  Diagnosis Date  . Morbid obesity   . Diabetes mellitus   . Hypertension   . CHF (congestive heart failure)     Diastolic;   . Pacemaker   . COPD (chronic obstructive pulmonary disease)   . Oxygen dependent   . Pickwickian syndrome   . Chronic back pain   . Coronary artery disease   . Shortness of breath   . Obesity hypoventilation syndrome   . Myocardial infarction   . Arthritis   . Hiatal hernia   . Tachy-brady syndrome     s/p pacemaker 2008  . Cellulitis     Recurrent bilateral LE  . Respiratory failure, acute 12/2009    Sec to diastolic CHF and COPD  . Constipation 04/02/2011    Past Surgical History  Procedure Date  . Abdominal hysterectomy   . Pacemaker insertion     Family History  Problem Relation Age of Onset  . Coronary artery disease Mother     MI age 8, died.  . Hypertension Father   . Osteoarthritis Father   . Hypertension Sister   . Hypertension Brother   . Obesity Father     History  Substance Use Topics  . Smoking status: Former Games developer  . Smokeless tobacco: Never Used  . Alcohol Use: No    OB History    Grav Para Term Preterm Abortions TAB SAB Ect Mult Living                  Review of Systems  All other systems reviewed and are negative.    Allergies  Review of patient's allergies indicates no known allergies.  Home Medications   Current Outpatient Rx  Name Route Sig  Dispense Refill  . ASPIRIN 81 MG PO TBEC Oral Take 1 tablet (81 mg total) by mouth daily.    . ALBUTEROL SULFATE (2.5 MG/3ML) 0.083% IN NEBU Nebulization Take 2.5 mg by nebulization every 6 (six) hours as needed. For shortness of breath    . CARVEDILOL 25 MG PO TABS Oral Take 1 tablet (25 mg total) by mouth 2 (two) times daily with a meal. 180 tablet 3  . CLONIDINE HCL 0.2 MG PO TABS Oral Take 0.2 mg by mouth 2 (two) times daily. **Takes two tablets at bedtime**    . FERROUS SULFATE 325 (65 FE) MG PO TABS Oral Take 325 mg by mouth daily.      Marland Kitchen FLECAINIDE ACETATE 100 MG PO TABS Oral Take 1 tablet (100 mg total) by mouth 2 (two) times daily. 60 tablet 12    Needs trough level drawn per home health   . FUROSEMIDE 40 MG PO TABS  THE DOSE WAS DECREASED TO 1/2 TABLET 2 TIMES DAILY.    Marland Kitchen GLIPIZIDE 5 MG PO TABS Oral Take 5 mg by mouth 2 (two) times daily before a meal.      . HYDRALAZINE HCL 100 MG PO TABS Oral Take 100  mg by mouth 2 (two) times daily.    . INSULIN LISPRO PROT & LISPRO (75-25) 100 UNIT/ML [place] SUSP Subcutaneous Inject 10-15 Units into the skin 2 (two) times daily.    Carma Leaven M PLUS PO TABS Oral Take 1 tablet by mouth daily.      . TRIPLE ANTIBIOTIC [id number] EX OINT  APPLY TO YOUR LEGS ON EVERY Tuesday, Thursday, AND Saturday. 28.3 g 0  . NYSTATIN 100000 UNIT/GM EX CREA  APPLY TO YOUR SKIN FOLDS AND LEGS EVERY Monday, Wednesday , AND Friday. 30 g 1  . SPIRONOLACTONE 25 MG PO TABS Oral Take 1 tablet (25 mg total) by mouth daily. 90 tablet 3  . SULFAMETHOXAZOLE-TRIMETHOPRIM 800-160 MG PO TABS Oral Take 1 tablet by mouth 2 (two) times daily. 20 tablet 0    BP 166/78  Pulse 91  Temp(Src) 98.9 F (37.2 C) (Oral)  Resp 18  Ht 5\' 8"  (1.727 m)  Wt 400 lb (181.439 kg)  BMI 60.82 kg/m2  SpO2 98%  Physical Exam  Nursing note and vitals reviewed. Constitutional: She is oriented to person, place, and time. She appears well-developed and well-nourished.       Patient is morbidly obese    HENT:  Head: Normocephalic and atraumatic.  Eyes: Conjunctivae and EOM are normal. Pupils are equal, round, and reactive to light.  Neck: Normal range of motion. Neck supple.  Cardiovascular: Normal rate and regular rhythm.   Pulmonary/Chest: Effort normal and breath sounds normal.  Abdominal: Soft. Bowel sounds are normal.  Musculoskeletal: Normal range of motion.       Right lower extremity:  Posterior thigh is erythematous and indurated. Neurovascular intact.  Neurological: She is alert and oriented to person, place, and time.  Skin: Skin is warm and dry.  Psychiatric: She has a normal mood and affect.    ED Course  Procedures (including critical care time)  Labs Reviewed  CBC - Abnormal; Notable for the following:    WBC 17.3 (*)    RBC 3.59 (*)    Hemoglobin 9.9 (*)    HCT 31.5 (*)    All other components within normal limits  DIFFERENTIAL - Abnormal; Notable for the following:    Neutrophils Relative 89 (*)    Neutro Abs 15.4 (*)    Lymphocytes Relative 6 (*)    All other components within normal limits  BASIC METABOLIC PANEL - Abnormal; Notable for the following:    Potassium 3.2 (*)    Chloride 95 (*)    Glucose, Bld 149 (*)    GFR calc non Af Amer 70 (*)    GFR calc Af Amer 81 (*)    All other components within normal limits  LAB REPORT - SCANNED   No results found.   1. Cellulitis of right lower extremity       MDM  Patient has cellulitis of her right thigh. 1 g IV vancomycin given in ED. Patient wishes to go home. We'll discharge home on Septra DS. she understands to return if worse        Donnetta Hutching, MD 08/22/11 [phone]

## 2011-08-24 ENCOUNTER — Encounter: Payer: Self-pay | Admitting: *Deleted

## 2011-09-24 ENCOUNTER — Emergency Department (HOSPITAL_COMMUNITY)
Admission: EM | Admit: 2011-09-24 | Discharge: 2011-09-24 | Disposition: A | Discharge status: Home / Self Care | Payer: Self-pay | Attending: Emergency Medicine | Admitting: Emergency Medicine

## 2011-09-24 ENCOUNTER — Emergency Department (HOSPITAL_COMMUNITY): Payer: Self-pay

## 2011-09-24 ENCOUNTER — Ambulatory Visit (HOSPITAL_COMMUNITY): Admission: RE | Admit: 2011-09-24 | Payer: Self-pay | Source: Ambulatory Visit

## 2011-09-24 ENCOUNTER — Encounter (HOSPITAL_COMMUNITY): Payer: Self-pay

## 2011-09-24 DIAGNOSIS — I252 Old myocardial infarction: Secondary | ICD-10-CM | POA: Insufficient documentation

## 2011-09-24 DIAGNOSIS — I10 Essential (primary) hypertension: Secondary | ICD-10-CM | POA: Insufficient documentation

## 2011-09-24 DIAGNOSIS — Z950 Presence of cardiac pacemaker: Secondary | ICD-10-CM | POA: Insufficient documentation

## 2011-09-24 DIAGNOSIS — R059 Cough, unspecified: Secondary | ICD-10-CM | POA: Insufficient documentation

## 2011-09-24 DIAGNOSIS — J449 Chronic obstructive pulmonary disease, unspecified: Secondary | ICD-10-CM

## 2011-09-24 DIAGNOSIS — E662 Morbid (severe) obesity with alveolar hypoventilation: Secondary | ICD-10-CM | POA: Insufficient documentation

## 2011-09-24 DIAGNOSIS — R1110 Vomiting, unspecified: Secondary | ICD-10-CM | POA: Insufficient documentation

## 2011-09-24 DIAGNOSIS — R079 Chest pain, unspecified: Secondary | ICD-10-CM | POA: Insufficient documentation

## 2011-09-24 DIAGNOSIS — E119 Type 2 diabetes mellitus without complications: Secondary | ICD-10-CM | POA: Insufficient documentation

## 2011-09-24 DIAGNOSIS — J4489 Other specified chronic obstructive pulmonary disease: Secondary | ICD-10-CM | POA: Insufficient documentation

## 2011-09-24 DIAGNOSIS — Z9981 Dependence on supplemental oxygen: Secondary | ICD-10-CM | POA: Insufficient documentation

## 2011-09-24 DIAGNOSIS — R05 Cough: Secondary | ICD-10-CM

## 2011-09-24 DIAGNOSIS — Z794 Long term (current) use of insulin: Secondary | ICD-10-CM | POA: Insufficient documentation

## 2011-09-24 DIAGNOSIS — I509 Heart failure, unspecified: Secondary | ICD-10-CM | POA: Insufficient documentation

## 2011-09-24 DIAGNOSIS — E876 Hypokalemia: Secondary | ICD-10-CM | POA: Insufficient documentation

## 2011-09-24 LAB — BASIC METABOLIC PANEL
CO2: 27 mEq/L (ref 19–32)
Calcium: 8.7 mg/dL (ref 8.4–10.5)
Creatinine, Ser: 1.13 mg/dL — ABNORMAL HIGH (ref 0.50–1.10)
Glucose, Bld: 156 mg/dL — ABNORMAL HIGH (ref 70–99)

## 2011-09-24 LAB — DIFFERENTIAL
Basophils Relative: 1 % (ref 0–1)
Blasts: 0 %
Lymphocytes Relative: 32 % (ref 12–46)
Lymphs Abs: 1.9 10*3/uL (ref 0.7–4.0)
Neutro Abs: 3.6 10*3/uL (ref 1.7–7.7)
Neutrophils Relative %: 61 % (ref 43–77)
Promyelocytes Absolute: 0 %
nRBC: 0 /100 WBC

## 2011-09-24 LAB — CBC
Hemoglobin: 9.7 g/dL — ABNORMAL LOW (ref 12.0–15.0)
MCHC: 31.5 g/dL (ref 30.0–36.0)
RDW: 16 % — ABNORMAL HIGH (ref 11.5–15.5)

## 2011-09-24 MED ORDER — DOXYCYCLINE HYCLATE 100 MG PO CAPS: 100.0000 mg | ORAL_CAPSULE | Freq: Two times a day (BID) | ORAL | Status: AC

## 2011-09-24 MED ORDER — POTASSIUM CHLORIDE CRYS ER 20 MEQ PO TBCR
60.0000 meq | EXTENDED_RELEASE_TABLET | Freq: Once | ORAL | Status: AC
  Administered 2011-09-24: 60 meq via ORAL
  Filled 2011-09-24: qty 3

## 2011-09-24 MED ORDER — ALBUTEROL SULFATE (5 MG/ML) 0.5% IN NEBU
5.0000 mg | INHALATION_SOLUTION | Freq: Once | RESPIRATORY_TRACT | Status: AC
  Administered 2011-09-24: 5 mg via RESPIRATORY_TRACT
  Filled 2011-09-24: qty 1

## 2011-09-24 MED ORDER — DOXYCYCLINE HYCLATE 100 MG PO TABS
100.0000 mg | ORAL_TABLET | Freq: Once | ORAL | Status: AC
  Administered 2011-09-24: 100 mg via ORAL
  Filled 2011-09-24: qty 1

## 2011-09-24 NOTE — Discharge Instructions (Signed)
Your x-rays do not show a bronchitis or pneumonia. Because you have COPD you'll be given an antibiotic. Take all of the antibiotic. Continue diffusion nebulizer. Continue with the remainder of your home medicines. Your potassium was slightly low for you were here today and we have given you potassium. Followup with your doctor.   Chronic Obstructive Pulmonary Disease Chronic obstructive pulmonary disease (COPD) is a lung disease. The lungs become damaged, making it hard to get air in and out of your lungs. The damage to your lungs cannot be changed.  HOME CARE  Stop smoking if you smoke. Avoid secondhand smoke.   Only take medicine as told by your doctor.   Talk to your doctor about using cough syrup or over-the-counter medicines.   Drink enough fluids to keep your pee (urine) clear or pale yellow.   Use a humidifier or vaporizer. This may help loosen the thick spit (mucus).   Talk to your doctor about vaccines that help prevent other lung problems (pneumonia and flu vaccines).   Use home oxygen as told by your doctor.   Stay active and exercise.   Eat healthy foods.  GET HELP RIGHT AWAY IF:   Your heart is beating fast.   You become disturbed, confused, shake, or are dazed.   You have trouble breathing.   You have chest pain.   You have a fever.   You cough up thick spit that is yellowish-white or green.   Your breathing becomes worse when you exercise.   You are running out of the medicine you take for your breathing.  MAKE SURE YOU:   Understand these instructions.   Will watch your condition.   Will get help right away if you are not doing well or get worse.  Document Released: 10/24/2007 Document Revised: 04/26/2011 Document Reviewed: 07/07/2010 [facility] Patient Information 2012 [place], [place].

## 2011-09-24 NOTE — ED Notes (Signed)
Mid sternal chest discomfort, burning type pain since yesterday

## 2011-09-24 NOTE — ED Provider Notes (Signed)
History     CSN: [id number]  Arrival date & time 09/24/11  0147   First MD Initiated Contact with Patient 09/24/11 0202      Chief Complaint  Patient presents with  . Chest Pain    (Consider location/radiation/quality/duration/timing/severity/associated sxs/prior treatment) HPI  ZURI LASCALA is a 56 y.o. female with a h/o morbid obesity, bedridden,  Diabetes, HTN, CHF, chronic pain, recurrent cellulitis, COPD on chronic O2 therapy,  brought in by ambulance, who presents to the Emergency Department complaining of cough productive of yellow sputum, associated with posttussive vomiting, and chest pain with coughing. She denies fever, chills, diarrhea.  PCP Dr. Loleta Chance  Past Medical History  Diagnosis Date  . Morbid obesity   . Diabetes mellitus   . Hypertension   . CHF (congestive heart failure)     Diastolic;   . Pacemaker   . COPD (chronic obstructive pulmonary disease)   . Oxygen dependent   . Pickwickian syndrome   . Chronic back pain   . Coronary artery disease   . Shortness of breath   . Obesity hypoventilation syndrome   . Myocardial infarction   . Arthritis   . Hiatal hernia   . Tachy-brady syndrome     s/p pacemaker 2008  . Cellulitis     Recurrent bilateral LE  . Respiratory failure, acute 12/2009    Sec to diastolic CHF and COPD  . Constipation 04/02/2011    Past Surgical History  Procedure Date  . Abdominal hysterectomy   . Pacemaker insertion     Family History  Problem Relation Age of Onset  . Coronary artery disease Mother     MI age 66, died.  . Hypertension Father   . Osteoarthritis Father   . Hypertension Sister   . Hypertension Brother   . Obesity Father     History  Substance Use Topics  . Smoking status: Former Games developer  . Smokeless tobacco: Never Used  . Alcohol Use: No    OB History    Grav Para Term Preterm Abortions TAB SAB Ect Mult Living                  Review of Systems  Constitutional: Negative for fever.       10  Systems reviewed and are negative for acute change except as noted in the HPI.  HENT: Negative for congestion.   Eyes: Negative for discharge and redness.  Respiratory: Positive for cough. Negative for shortness of breath.   Cardiovascular: Negative for chest pain.  Gastrointestinal: Positive for vomiting. Negative for abdominal pain.  Musculoskeletal: Negative for back pain.  Skin: Negative for rash.  Neurological: Negative for syncope, numbness and headaches.  Psychiatric/Behavioral:       No behavior change.    Allergies  Review of patient's allergies indicates no known allergies.  Home Medications   Current Outpatient Rx  Name Route Sig Dispense Refill  . ALBUTEROL SULFATE (2.5 MG/3ML) 0.083% IN NEBU Nebulization Take 2.5 mg by nebulization every 6 (six) hours as needed. For shortness of breath    . ASPIRIN 81 MG PO TBEC Oral Take 1 tablet (81 mg total) by mouth daily.    Marland Kitchen CARVEDILOL 25 MG PO TABS Oral Take 1 tablet (25 mg total) by mouth 2 (two) times daily with a meal. 180 tablet 3  . CLONIDINE HCL 0.2 MG PO TABS Oral Take 0.2 mg by mouth 2 (two) times daily. **Takes two tablets at bedtime**    . FERROUS  SULFATE 325 (65 FE) MG PO TABS Oral Take 325 mg by mouth daily.      Marland Kitchen FLECAINIDE ACETATE 100 MG PO TABS Oral Take 1 tablet (100 mg total) by mouth 2 (two) times daily. 60 tablet 12    Needs trough level drawn per home health   . FUROSEMIDE 40 MG PO TABS  THE DOSE WAS DECREASED TO 1/2 TABLET 2 TIMES DAILY.    Marland Kitchen GLIPIZIDE 5 MG PO TABS Oral Take 5 mg by mouth 2 (two) times daily before a meal.      . HYDRALAZINE HCL 100 MG PO TABS Oral Take 100 mg by mouth 2 (two) times daily.    . INSULIN LISPRO PROT & LISPRO (75-25) 100 UNIT/ML [place] SUSP Subcutaneous Inject 10-15 Units into the skin 2 (two) times daily.    Carma Leaven M PLUS PO TABS Oral Take 1 tablet by mouth daily.      . TRIPLE ANTIBIOTIC [phone] EX OINT  APPLY TO YOUR LEGS ON EVERY Tuesday, Thursday, AND Saturday. 28.3 g 0    . NYSTATIN 100000 UNIT/GM EX CREA  APPLY TO YOUR SKIN FOLDS AND LEGS EVERY Monday, Wednesday , AND Friday. 30 g 1  . SPIRONOLACTONE 25 MG PO TABS Oral Take 1 tablet (25 mg total) by mouth daily. 90 tablet 3    BP 143/95  Pulse 67  Temp(Src) 99 F (37.2 C) (Oral)  Ht 5' (1.524 m)  Wt 400 lb (181.439 kg)  BMI 78.12 kg/m2  SpO2 99%  Physical Exam  Nursing note and vitals reviewed. Constitutional:       Awake, alert, nontoxic appearance. Morbidly obese  HENT:  Head: Normocephalic and atraumatic.  Right Ear: External ear normal.  Left Ear: External ear normal.  Eyes: EOM are normal. Pupils are equal, round, and reactive to light. Right eye exhibits no discharge. Left eye exhibits no discharge.  Neck: Normal range of motion. Neck supple.  Cardiovascular: Normal rate.   Pulmonary/Chest: Effort normal. She exhibits no tenderness.       Crackles at the bases, no wheezing noted  Abdominal: Soft. Bowel sounds are normal. There is no tenderness. There is no rebound.       obese  Musculoskeletal: She exhibits no tenderness.       Baseline ROM, no obvious new focal weakness.Foot drop bilaterally  Neurological:       Mental status and motor strength appears baseline for patient and situation.  Skin: No rash noted.       Chronic irritative rash around her neck. Intertrigo, chronic skin changes to lower legs.  Psychiatric: She has a normal mood and affect.    ED Course  Procedures (including critical care time) Results for orders placed during the hospital encounter of 09/24/11  CBC      Component Value Range   WBC 6.0  4.0 - 10.5 (K/uL)   RBC 3.55 (*) 3.87 - 5.11 (MIL/uL)   Hemoglobin 9.7 (*) 12.0 - 15.0 (g/dL)   HCT 16.1 (*) 09.6 - 46.0 (%)   MCV 86.8  78.0 - 100.0 (fL)   MCH 27.3  26.0 - 34.0 (pg)   MCHC 31.5  30.0 - 36.0 (g/dL)   RDW 04.5 (*) 40.9 - 15.5 (%)   Platelets 219  150 - 400 (K/uL)  DIFFERENTIAL      Component Value Range   Neutrophils Relative 61  43 - 77 (%)    Lymphocytes Relative 32  12 - 46 (%)   Monocytes Relative  5  3 - 12 (%)   Eosinophils Relative 1  0 - 5 (%)   Basophils Relative 1  0 - 1 (%)   Band Neutrophils 0  0 - 10 (%)   Metamyelocytes Relative 0     Myelocytes 0     Promyelocytes Absolute 0     Blasts 0     nRBC 0  0 (/100 WBC)   Neutro Abs 3.6  1.7 - 7.7 (K/uL)   Lymphs Abs 1.9  0.7 - 4.0 (K/uL)   Monocytes Absolute 0.3  0.1 - 1.0 (K/uL)   Eosinophils Absolute 0.1  0.0 - 0.7 (K/uL)   Basophils Absolute 0.1  0.0 - 0.1 (K/uL)  BASIC METABOLIC PANEL      Component Value Range   Sodium 136  135 - 145 (mEq/L)   Potassium 3.2 (*) 3.5 - 5.1 (mEq/L)   Chloride 96  96 - 112 (mEq/L)   CO2 27  19 - 32 (mEq/L)   Glucose, Bld 156 (*) 70 - 99 (mg/dL)   BUN 21  6 - 23 (mg/dL)   Creatinine, Ser 4.09 (*) 0.50 - 1.10 (mg/dL)   Calcium 8.7  8.4 - 81.1 (mg/dL)   GFR calc non Af Amer 53 (*) >90 (mL/min)   GFR calc Af Amer 62 (*) >90 (mL/min)  TROPONIN I      Component Value Range   Troponin I <0.30  <0.30 (ng/mL)  POCT I-STAT TROPONIN I      Component Value Range   Troponin i, poc 0.01  0.00 - 0.08 (ng/mL)   Comment 3            Dg Chest Port 1 View  09/24/2011  *RADIOLOGY REPORT*  Clinical Data: Shortness of breath and chest pain.  PORTABLE CHEST - 1 VIEW  Comparison: Chest radiograph performed 07/11/2011  Findings: The lungs are well-aerated.  Vascular congestion is noted, with mildly increased interstitial markings, possibly reflecting minimal interstitial edema.  There is no evidence of focal opacification, pleural effusion or pneumothorax.  The cardiomediastinal silhouette is mildly enlarged.  A pacemaker is seen overlying the left chest wall, with leads ending overlying the right atrium and right ventricle.  No acute osseous abnormalities are seen.  A hairpin overlying the right hilum is likely outside the patient.  IMPRESSION: Vascular congestion and mild cardiomegaly, with mildly increased interstitial markings, possibly reflecting  minimal interstitial edema.  Original Report Authenticated By: Tonia Ghent, M.D.      Date: 09/24/2011 0200  Rate: 68  Rhythm: electronic ventricular pacemaker  No change from 03/31/2011   MDM  A sharp with morbid obesity, COPD on chronic oxygen, here with productive cough and shortness of breath.Labs unremarkable except for slightly low potassium which was repleted.  Chest x-ray shows some vascular congestion and a mild increase in interstitial markings but no bronchitis or pneumonia. Antibiotic therapy was initiated.She was given a nebulizer treatment with improvement.Pt stable in ED with no significant deterioration in condition.The patient appears reasonably screened and/or stabilized for discharge and I doubt any other medical condition or other [facility] requiring further screening, evaluation, or treatment in the ED at this time prior to discharge.  MDM Reviewed: nursing note and vitals Interpretation: labs, ECG and x-ray        Nicoletta Dress. Colon Branch, MD 09/24/11 [phone]

## 2011-11-12 ENCOUNTER — Emergency Department (HOSPITAL_COMMUNITY): Payer: Medicaid Other

## 2011-11-12 ENCOUNTER — Inpatient Hospital Stay (HOSPITAL_COMMUNITY)
Admission: EM | Admit: 2011-11-12 | Discharge: 2011-11-15 | DRG: 603 | Disposition: A | Discharge status: Home Health | Payer: Medicaid Other | Source: Ambulatory Visit | Attending: Internal Medicine | Admitting: Internal Medicine

## 2011-11-12 ENCOUNTER — Encounter (HOSPITAL_COMMUNITY): Payer: Self-pay | Admitting: Internal Medicine

## 2011-11-12 DIAGNOSIS — Z8249 Family history of ischemic heart disease and other diseases of the circulatory system: Secondary | ICD-10-CM

## 2011-11-12 DIAGNOSIS — J4489 Other specified chronic obstructive pulmonary disease: Secondary | ICD-10-CM | POA: Diagnosis present

## 2011-11-12 DIAGNOSIS — M25569 Pain in unspecified knee: Secondary | ICD-10-CM

## 2011-11-12 DIAGNOSIS — K449 Diaphragmatic hernia without obstruction or gangrene: Secondary | ICD-10-CM | POA: Diagnosis present

## 2011-11-12 DIAGNOSIS — R001 Bradycardia, unspecified: Secondary | ICD-10-CM

## 2011-11-12 DIAGNOSIS — Z794 Long term (current) use of insulin: Secondary | ICD-10-CM

## 2011-11-12 DIAGNOSIS — L02419 Cutaneous abscess of limb, unspecified: Principal | ICD-10-CM | POA: Diagnosis present

## 2011-11-12 DIAGNOSIS — L03039 Cellulitis of unspecified toe: Secondary | ICD-10-CM

## 2011-11-12 DIAGNOSIS — Z90710 Acquired absence of both cervix and uterus: Secondary | ICD-10-CM

## 2011-11-12 DIAGNOSIS — I498 Other specified cardiac arrhythmias: Secondary | ICD-10-CM

## 2011-11-12 DIAGNOSIS — I5032 Chronic diastolic (congestive) heart failure: Secondary | ICD-10-CM

## 2011-11-12 DIAGNOSIS — E785 Hyperlipidemia, unspecified: Secondary | ICD-10-CM | POA: Diagnosis present

## 2011-11-12 DIAGNOSIS — E871 Hypo-osmolality and hyponatremia: Secondary | ICD-10-CM

## 2011-11-12 DIAGNOSIS — Z9981 Dependence on supplemental oxygen: Secondary | ICD-10-CM

## 2011-11-12 DIAGNOSIS — N179 Acute kidney failure, unspecified: Secondary | ICD-10-CM | POA: Diagnosis present

## 2011-11-12 DIAGNOSIS — L304 Erythema intertrigo: Secondary | ICD-10-CM

## 2011-11-12 DIAGNOSIS — Z8679 Personal history of other diseases of the circulatory system: Secondary | ICD-10-CM

## 2011-11-12 DIAGNOSIS — G4733 Obstructive sleep apnea (adult) (pediatric): Secondary | ICD-10-CM

## 2011-11-12 DIAGNOSIS — E118 Type 2 diabetes mellitus with unspecified complications: Secondary | ICD-10-CM

## 2011-11-12 DIAGNOSIS — I5033 Acute on chronic diastolic (congestive) heart failure: Secondary | ICD-10-CM

## 2011-11-12 DIAGNOSIS — M069 Rheumatoid arthritis, unspecified: Secondary | ICD-10-CM

## 2011-11-12 DIAGNOSIS — IMO0002 Reserved for concepts with insufficient information to code with codable children: Secondary | ICD-10-CM

## 2011-11-12 DIAGNOSIS — J45909 Unspecified asthma, uncomplicated: Secondary | ICD-10-CM

## 2011-11-12 DIAGNOSIS — M545 Low back pain: Secondary | ICD-10-CM

## 2011-11-12 DIAGNOSIS — I509 Heart failure, unspecified: Secondary | ICD-10-CM | POA: Diagnosis present

## 2011-11-12 DIAGNOSIS — J449 Chronic obstructive pulmonary disease, unspecified: Secondary | ICD-10-CM | POA: Diagnosis present

## 2011-11-12 DIAGNOSIS — M1710 Unilateral primary osteoarthritis, unspecified knee: Secondary | ICD-10-CM

## 2011-11-12 DIAGNOSIS — E1165 Type 2 diabetes mellitus with hyperglycemia: Secondary | ICD-10-CM

## 2011-11-12 DIAGNOSIS — I5030 Unspecified diastolic (congestive) heart failure: Secondary | ICD-10-CM | POA: Diagnosis present

## 2011-11-12 DIAGNOSIS — Z950 Presence of cardiac pacemaker: Secondary | ICD-10-CM

## 2011-11-12 DIAGNOSIS — L03119 Cellulitis of unspecified part of limb: Secondary | ICD-10-CM

## 2011-11-12 DIAGNOSIS — L02619 Cutaneous abscess of unspecified foot: Secondary | ICD-10-CM

## 2011-11-12 DIAGNOSIS — E662 Morbid (severe) obesity with alveolar hypoventilation: Secondary | ICD-10-CM | POA: Diagnosis present

## 2011-11-12 DIAGNOSIS — Z6841 Body Mass Index (BMI) 40.0 and over, adult: Secondary | ICD-10-CM

## 2011-11-12 DIAGNOSIS — M1990 Unspecified osteoarthritis, unspecified site: Secondary | ICD-10-CM

## 2011-11-12 DIAGNOSIS — G8929 Other chronic pain: Secondary | ICD-10-CM | POA: Diagnosis present

## 2011-11-12 DIAGNOSIS — IMO0001 Reserved for inherently not codable concepts without codable children: Secondary | ICD-10-CM | POA: Diagnosis present

## 2011-11-12 DIAGNOSIS — M549 Dorsalgia, unspecified: Secondary | ICD-10-CM | POA: Diagnosis present

## 2011-11-12 DIAGNOSIS — L0390 Cellulitis, unspecified: Secondary | ICD-10-CM | POA: Diagnosis present

## 2011-11-12 DIAGNOSIS — Z87891 Personal history of nicotine dependence: Secondary | ICD-10-CM

## 2011-11-12 DIAGNOSIS — K219 Gastro-esophageal reflux disease without esophagitis: Secondary | ICD-10-CM | POA: Diagnosis present

## 2011-11-12 DIAGNOSIS — I442 Atrioventricular block, complete: Secondary | ICD-10-CM

## 2011-11-12 DIAGNOSIS — I10 Essential (primary) hypertension: Secondary | ICD-10-CM | POA: Diagnosis present

## 2011-11-12 DIAGNOSIS — R0602 Shortness of breath: Secondary | ICD-10-CM

## 2011-11-12 DIAGNOSIS — N182 Chronic kidney disease, stage 2 (mild): Secondary | ICD-10-CM | POA: Diagnosis present

## 2011-11-12 DIAGNOSIS — I2510 Atherosclerotic heart disease of native coronary artery without angina pectoris: Secondary | ICD-10-CM | POA: Diagnosis present

## 2011-11-12 DIAGNOSIS — M7989 Other specified soft tissue disorders: Secondary | ICD-10-CM

## 2011-11-12 DIAGNOSIS — N318 Other neuromuscular dysfunction of bladder: Secondary | ICD-10-CM

## 2011-11-12 DIAGNOSIS — I890 Lymphedema, not elsewhere classified: Secondary | ICD-10-CM | POA: Diagnosis present

## 2011-11-12 DIAGNOSIS — I252 Old myocardial infarction: Secondary | ICD-10-CM

## 2011-11-12 DIAGNOSIS — I129 Hypertensive chronic kidney disease with stage 1 through stage 4 chronic kidney disease, or unspecified chronic kidney disease: Secondary | ICD-10-CM | POA: Diagnosis present

## 2011-11-12 DIAGNOSIS — D509 Iron deficiency anemia, unspecified: Secondary | ICD-10-CM

## 2011-11-12 DIAGNOSIS — M129 Arthropathy, unspecified: Secondary | ICD-10-CM | POA: Diagnosis present

## 2011-11-12 HISTORY — DX: Type 2 diabetes mellitus without complications: E11.9

## 2011-11-12 HISTORY — DX: Gout, unspecified: M10.9

## 2011-11-12 HISTORY — DX: Sleep apnea, unspecified: G47.30

## 2011-11-12 HISTORY — DX: Peripheral vascular disease, unspecified: I73.9

## 2011-11-12 LAB — DIFFERENTIAL
Eosinophils Absolute: 0.1 10*3/uL (ref 0.0–0.7)
Eosinophils Relative: 1 % (ref 0–5)
Lymphocytes Relative: 3 % — ABNORMAL LOW (ref 12–46)
Lymphs Abs: 0.8 10*3/uL (ref 0.7–4.0)
Monocytes Relative: 4 % (ref 3–12)
Neutrophils Relative %: 92 % — ABNORMAL HIGH (ref 43–77)

## 2011-11-12 LAB — URINALYSIS, ROUTINE W REFLEX MICROSCOPIC
Bilirubin Urine: NEGATIVE
Glucose, UA: NEGATIVE mg/dL
Hgb urine dipstick: NEGATIVE
Specific Gravity, Urine: 1.018 (ref 1.005–1.030)
Urobilinogen, UA: 0.2 mg/dL (ref 0.0–1.0)

## 2011-11-12 LAB — POCT I-STAT, CHEM 8
Calcium, Ion: 1.06 mmol/L — ABNORMAL LOW (ref 1.12–1.32)
Chloride: 92 mEq/L — ABNORMAL LOW (ref 96–112)
Glucose, Bld: 147 mg/dL — ABNORMAL HIGH (ref 70–99)
HCT: 31 % — ABNORMAL LOW (ref 36.0–46.0)
Hemoglobin: 10.5 g/dL — ABNORMAL LOW (ref 12.0–15.0)
TCO2: 34 mmol/L (ref 0–100)

## 2011-11-12 LAB — URINE MICROSCOPIC-ADD ON

## 2011-11-12 LAB — CREATININE, SERUM
GFR calc Af Amer: 49 mL/min — ABNORMAL LOW (ref >90)
GFR calc non Af Amer: 43 mL/min — ABNORMAL LOW (ref >90)

## 2011-11-12 LAB — GLUCOSE, CAPILLARY
Glucose-Capillary: 161 mg/dL — ABNORMAL HIGH (ref 70–99)
Glucose-Capillary: 144 mg/dL — ABNORMAL HIGH (ref 70–99)

## 2011-11-12 LAB — CBC
Hemoglobin: 9.2 g/dL — ABNORMAL LOW (ref 12.0–15.0)
MCH: 27.5 pg (ref 26.0–34.0)
MCV: 86 fL (ref 78.0–100.0)
RBC: 3.35 MIL/uL — ABNORMAL LOW (ref 3.87–5.11)

## 2011-11-12 LAB — HEMOGLOBIN A1C: Mean Plasma Glucose: 177 mg/dL — ABNORMAL HIGH (ref <117)

## 2011-11-12 MED ORDER — SODIUM CHLORIDE 0.9 % IV SOLN
2000.0000 mg | INTRAVENOUS | Status: DC
  Administered 2011-11-13 – 2011-11-14 (×2): 2000 mg via INTRAVENOUS
  Filled 2011-11-12 (×3): qty 2000

## 2011-11-12 MED ORDER — CLONIDINE HCL 0.2 MG PO TABS
0.2000 mg | ORAL_TABLET | Freq: Two times a day (BID) | ORAL | Status: DC
  Administered 2011-11-12 – 2011-11-14 (×5): 0.2 mg via ORAL
  Filled 2011-11-12 (×6): qty 1

## 2011-11-12 MED ORDER — NYSTATIN 100000 UNIT/GM EX CREA
1.0000 "application " | TOPICAL_CREAM | Freq: Two times a day (BID) | CUTANEOUS | Status: DC
  Administered 2011-11-12 – 2011-11-15 (×7): 1 via TOPICAL
  Filled 2011-11-12: qty 15

## 2011-11-12 MED ORDER — THERA M PLUS PO TABS
1.0000 | ORAL_TABLET | Freq: Every day | ORAL | Status: DC
Start: 2011-11-12 — End: 2011-11-12

## 2011-11-12 MED ORDER — OXYCODONE-ACETAMINOPHEN 5-325 MG PO TABS
1.0000 | ORAL_TABLET | ORAL | Status: DC | PRN
  Administered 2011-11-12 – 2011-11-14 (×4): 2 via ORAL
  Filled 2011-11-12 (×4): qty 2

## 2011-11-12 MED ORDER — PIPERACILLIN-TAZOBACTAM 3.375 G IVPB
3.3750 g | Freq: Three times a day (TID) | INTRAVENOUS | Status: DC
  Administered 2011-11-12 – 2011-11-14 (×7): 3.375 g via INTRAVENOUS
  Filled 2011-11-12 (×9): qty 50

## 2011-11-12 MED ORDER — ACETAMINOPHEN 325 MG PO TABS: 650.0000 mg | ORAL_TABLET | ORAL | Status: DC | PRN

## 2011-11-12 MED ORDER — INSULIN ASPART 100 UNIT/ML ~~LOC~~ SOLN
0.0000 [IU] | Freq: Three times a day (TID) | SUBCUTANEOUS | Status: DC
  Administered 2011-11-12: 2 [IU] via SUBCUTANEOUS
  Administered 2011-11-12: 1 [IU] via SUBCUTANEOUS
  Administered 2011-11-13 – 2011-11-14 (×3): 2 [IU] via SUBCUTANEOUS
  Administered 2011-11-14: 1 [IU] via SUBCUTANEOUS
  Administered 2011-11-14: 2 [IU] via SUBCUTANEOUS
  Administered 2011-11-15 (×2): 1 [IU] via SUBCUTANEOUS

## 2011-11-12 MED ORDER — VANCOMYCIN HCL 1000 MG IV SOLR
1500.0000 mg | Freq: Once | INTRAVENOUS | Status: AC
  Administered 2011-11-12: 1500 mg via INTRAVENOUS
  Filled 2011-11-12: qty 1500

## 2011-11-12 MED ORDER — INSULIN ASPART 100 UNIT/ML ~~LOC~~ SOLN: 0.0000 [IU] | Freq: Every day | SUBCUTANEOUS | Status: DC

## 2011-11-12 MED ORDER — ALBUTEROL SULFATE (5 MG/ML) 0.5% IN NEBU
2.5000 mg | INHALATION_SOLUTION | Freq: Four times a day (QID) | RESPIRATORY_TRACT | Status: DC | PRN
  Administered 2011-11-14 (×2): 2.5 mg via RESPIRATORY_TRACT
  Filled 2011-11-12 (×2): qty 0.5

## 2011-11-12 MED ORDER — VANCOMYCIN HCL IN DEXTROSE 1-5 GM/200ML-% IV SOLN
1000.0000 mg | Freq: Once | INTRAVENOUS | Status: AC
  Administered 2011-11-12: 1000 mg via INTRAVENOUS
  Filled 2011-11-12: qty 200

## 2011-11-12 MED ORDER — VITAMIN C 500 MG PO TABS
500.0000 mg | ORAL_TABLET | Freq: Every day | ORAL | Status: DC
  Administered 2011-11-12 – 2011-11-15 (×4): 500 mg via ORAL
  Filled 2011-11-12 (×4): qty 1

## 2011-11-12 MED ORDER — VANCOMYCIN HCL IN DEXTROSE 1-5 GM/200ML-% IV SOLN
1000.0000 mg | Freq: Two times a day (BID) | INTRAVENOUS | Status: DC
  Filled 2011-11-12: qty 200

## 2011-11-12 MED ORDER — HYDRALAZINE HCL 50 MG PO TABS
100.0000 mg | ORAL_TABLET | Freq: Two times a day (BID) | ORAL | Status: DC
  Administered 2011-11-12 – 2011-11-14 (×6): 100 mg via ORAL
  Filled 2011-11-12 (×8): qty 2

## 2011-11-12 MED ORDER — HYDROMORPHONE HCL PF 1 MG/ML IJ SOLN: 0.5000 mg | INTRAMUSCULAR | Status: DC | PRN

## 2011-11-12 MED ORDER — ENOXAPARIN SODIUM 40 MG/0.4ML ~~LOC~~ SOLN
40.0000 mg | SUBCUTANEOUS | Status: DC
  Filled 2011-11-12: qty 0.4

## 2011-11-12 MED ORDER — ASPIRIN EC 81 MG PO TBEC
81.0000 mg | DELAYED_RELEASE_TABLET | Freq: Every day | ORAL | Status: DC
  Administered 2011-11-12 – 2011-11-15 (×4): 81 mg via ORAL
  Filled 2011-11-12 (×4): qty 1

## 2011-11-12 MED ORDER — FERROUS SULFATE 325 (65 FE) MG PO TABS
325.0000 mg | ORAL_TABLET | Freq: Every day | ORAL | Status: DC
  Administered 2011-11-12 – 2011-11-15 (×4): 325 mg via ORAL
  Filled 2011-11-12 (×4): qty 1

## 2011-11-12 MED ORDER — ADULT MULTIVITAMIN W/MINERALS CH
1.0000 | ORAL_TABLET | Freq: Every day | ORAL | Status: DC
  Administered 2011-11-12 – 2011-11-15 (×4): 1 via ORAL
  Filled 2011-11-12 (×4): qty 1

## 2011-11-12 MED ORDER — PIPERACILLIN-TAZOBACTAM 3.375 G IVPB
3.3750 g | Freq: Four times a day (QID) | INTRAVENOUS | Status: DC
  Filled 2011-11-12 (×2): qty 50

## 2011-11-12 MED ORDER — INSULIN ASPART PROT & ASPART (70-30 MIX) 100 UNIT/ML ~~LOC~~ SUSP
20.0000 [IU] | Freq: Every day | SUBCUTANEOUS | Status: DC
  Administered 2011-11-13 – 2011-11-15 (×3): 20 [IU] via SUBCUTANEOUS
  Filled 2011-11-12: qty 10

## 2011-11-12 MED ORDER — HYDRALAZINE HCL 100 MG PO TABS: 100.0000 mg | ORAL_TABLET | Freq: Two times a day (BID) | ORAL | Status: DC

## 2011-11-12 MED ORDER — ENOXAPARIN SODIUM 100 MG/ML ~~LOC~~ SOLN
90.0000 mg | SUBCUTANEOUS | Status: DC
  Administered 2011-11-12 – 2011-11-15 (×4): 90 mg via SUBCUTANEOUS
  Filled 2011-11-12 (×4): qty 1

## 2011-11-12 MED ORDER — FUROSEMIDE 80 MG PO TABS
120.0000 mg | ORAL_TABLET | Freq: Every day | ORAL | Status: DC
  Administered 2011-11-12 – 2011-11-15 (×4): 120 mg via ORAL
  Filled 2011-11-12 (×4): qty 1

## 2011-11-12 MED ORDER — PIPERACILLIN-TAZOBACTAM 3.375 G IVPB 30 MIN
3.3750 g | Freq: Once | INTRAVENOUS | Status: AC
  Administered 2011-11-12: 3.375 g via INTRAVENOUS
  Filled 2011-11-12: qty 50

## 2011-11-12 MED ORDER — SODIUM CHLORIDE 0.9 % IJ SOLN
3.0000 mL | Freq: Two times a day (BID) | INTRAMUSCULAR | Status: DC
  Administered 2011-11-12 – 2011-11-15 (×5): 3 mL via INTRAVENOUS

## 2011-11-12 NOTE — ED Notes (Signed)
Tried to insert a 47f cath but met resistance and could not insert it

## 2011-11-12 NOTE — ED Notes (Signed)
Asked pt for urine, can not void at this time

## 2011-11-12 NOTE — Progress Notes (Signed)
ANTIBIOTIC CONSULT NOTE - INITIAL  Pharmacy Consult for Vancomycin Indication: LE cellulitis  No Known Allergies  Patient Measurements: Height: 5' (152.4 cm) Weight: 400 lb (181.439 kg) IBW/kg (Calculated) : 45.5   Vital Signs: Temp: 99.7 F (37.6 C) (06/24 0818) Temp src: Oral (06/24 0818) BP: 144/81 mmHg (06/24 0818) Pulse Rate: 76  (06/24 0818) Intake/Output from previous day:   Intake/Output from this shift:    Labs:  Basename 11/12/11 0511 11/12/11 0332 11/12/11 0249  WBC -- -- 22.0*  HGB -- 10.5* 9.2*  PLT -- -- 216  LABCREA -- -- --  CREATININE 1.36* 1.40* --   CrCl ~ 50 ml/min (normalized)    Medical History: Past Medical History  Diagnosis Date  . Morbid obesity   . Diabetes mellitus   . Hypertension   . CHF (congestive heart failure)     Diastolic;   . Pacemaker   . COPD (chronic obstructive pulmonary disease)   . Oxygen dependent   . Pickwickian syndrome   . Chronic back pain   . Coronary artery disease   . Shortness of breath   . Obesity hypoventilation syndrome   . Myocardial infarction   . Arthritis   . Hiatal hernia   . Tachy-brady syndrome     s/p pacemaker 2008  . Cellulitis     Recurrent bilateral LE  . Respiratory failure, acute 12/2009    Sec to diastolic CHF and COPD  . Constipation 04/02/2011    Medications:  Pt received Vancomycin 1gm IV x 1 dose in ER ~ 0520 11/12/2011 Pt also started on Zosyn 3.375gm IV q6h  Assessment: 56 yo F presented to ER with c/o LE redness and swelling (R>L) for last 24 hours.  To start IV antibiotics for cellulitis.  Noted weight ~ 180 kg, SCr 1.36 with normalized CrCl ~ 50, and hx diabetes.  Goal of Therapy:  Vancomycin trough level 10-15 mcg/ml  Plan:  1. Give Vancomycin 1500mg  IV x 1 dose this AM (to complete 2500mg  total loading dose.  2.  Start Vancomycin 2000mg  IV q24 hours, nest dose 6/25 at 0800. 3.  Adjust Zosyn to 3.375gm IV q8h (extended interval infusion) per pharmacy protocol. 4.   Adjust Lovenox to obesity (0.5 mg/kg/day) dosing = 90 mg SQ Q24h.  **Noted patient restarted on Novolog 70/30 20 units daily with breakfast.  Per medication history, pt takes 30 units TID with meals.  Follow CBGs and adjust if needed.  [employer], Pharm.D., BCPS Clinical Pharmacist Pager [phone] 11/12/2011 8:47 AM

## 2011-11-12 NOTE — Progress Notes (Signed)
VASCULAR LAB PRELIMINARY  PRELIMINARY  PRELIMINARY  PRELIMINARY  Bilateral lower extremity venous duplex  completed.    Preliminary report:  Bilateral:  No obvious evidence of DVT, superficial thrombosis, or Baker's Cyst. Technically limited by body habitus.  Unable to visualize femoral vein past the proximal portion.  Calf veins not adeqately visualized.    Ioanna Colquhoun, RVT 11/12/2011, 12:14 PM

## 2011-11-12 NOTE — ED Provider Notes (Signed)
History     CSN: [id number]  Arrival date & time 11/12/11  0229   First MD Initiated Contact with Patient 11/12/11 0249      Chief Complaint  Patient presents with  . Cellulitis    (Consider location/radiation/quality/duration/timing/severity/associated sxs/prior treatment) Patient is a 56 y.o. female presenting with rash. The history is provided by the patient. No language interpreter was used.  Rash  This is a recurrent problem. The current episode started yesterday. The problem has been gradually worsening. The problem is associated with nothing. There has been no fever. The rash is present on the right lower leg. The pain is at a severity of 8/10. The pain is severe. The pain has been constant since onset. Associated symptoms include pain. Pertinent negatives include no weeping. She has tried nothing for the symptoms. The treatment provided no relief. Risk factors: none.  Patient with flu like symptoms and redness of the RLE that she states is cw her previous cellulitis.  She has felt chilled, sleepy and had myalgias.    Past Medical History  Diagnosis Date  . Morbid obesity   . Diabetes mellitus   . Hypertension   . CHF (congestive heart failure)     Diastolic;   . Pacemaker   . COPD (chronic obstructive pulmonary disease)   . Oxygen dependent   . Pickwickian syndrome   . Chronic back pain   . Coronary artery disease   . Shortness of breath   . Obesity hypovent synd   . Myocardial infarction   . Arthritis   . Hiatal hernia   . Tachy-brady syndrome     s/p pacemaker 2008  . Cellulitis     Recurrent bilateral LE  . Respiratory failure, acute 12/2009    Sec to diastolic CHF and COPD  . Constipation 04/02/2011    Past Surgical History  Procedure Date  . Abdominal hysterectomy   . Pacemaker insertion     Family History  Problem Relation Age of Onset  . Coronary artery disease Mother     MI age 47, died.  . Hypertension Father   . Osteoarthritis Father   .  Hypertension Sister   . Hypertension Brother   . Obesity Father     History  Substance Use Topics  . Smoking status: Former Games developer  . Smokeless tobacco: Never Used  . Alcohol Use: No    OB History    Grav Para Term Preterm Abortions TAB SAB Ect Mult Living                  Review of Systems  Constitutional: Positive for chills.  Musculoskeletal: Positive for myalgias.  Skin: Positive for rash. Negative for wound.  All other systems reviewed and are negative.    Allergies  Review of patient's allergies indicates no known allergies.  Home Medications   Current Outpatient Rx  Name Route Sig Dispense Refill  . ALBUTEROL SULFATE (2.5 MG/3ML) 0.083% IN NEBU Nebulization Take 2.5 mg by nebulization every 6 (six) hours as needed. For shortness of breath    . ASPIRIN 81 MG PO TBEC Oral Take 1 tablet (81 mg total) by mouth daily.    Marland Kitchen CARVEDILOL 25 MG PO TABS Oral Take 1 tablet (25 mg total) by mouth 2 (two) times daily with a meal. 180 tablet 3  . FERROUS SULFATE 325 (65 FE) MG PO TABS Oral Take 325 mg by mouth daily.      . FUROSEMIDE 40 MG PO TABS Oral Take  120 mg by mouth daily. THE DOSE WAS DECREASED TO 1/2 TABLET 2 TIMES DAILY.    Marland Kitchen HYDRALAZINE HCL 100 MG PO TABS Oral Take 100 mg by mouth 2 (two) times daily.    . INSULIN LISPRO PROT & LISPRO (75-25) 100 UNIT/ML [place] SUSP Subcutaneous Inject 20 Units into the skin 3 (three) times daily.     Carma Leaven M PLUS PO TABS Oral Take 1 tablet by mouth daily.      . ALEVE PO Oral Take 2 tablets by mouth daily as needed. For pain    . TRIPLE ANTIBIOTIC [phone] EX OINT  APPLY TO YOUR LEGS ON EVERY Tuesday, Thursday, AND Saturday. 28.3 g 0  . NYSTATIN 100000 UNIT/GM EX CREA  APPLY TO YOUR SKIN FOLDS AND LEGS EVERY Monday, Wednesday , AND Friday. 30 g 1  . VITAMIN C 500 MG PO TABS Oral Take 500 mg by mouth daily.    Marland Kitchen CLONIDINE HCL 0.2 MG PO TABS Oral Take 0.2 mg by mouth 2 (two) times daily. **Takes two tablets at bedtime**      BP  124/80  Temp 98.3 F (36.8 C) (Oral)  Resp 28  Ht 5' (1.524 m)  Wt 400 lb (181.439 kg)  BMI 78.12 kg/m2  SpO2 98%  Physical Exam  Constitutional: She is oriented to person, place, and time. She appears well-developed and well-nourished. No distress.  HENT:  Head: Normocephalic and atraumatic.  Mouth/Throat: Oropharynx is clear and moist.  Eyes: Conjunctivae are normal. Pupils are equal, round, and reactive to light.  Neck: Normal range of motion. Neck supple.  Cardiovascular: Normal rate and regular rhythm.   Pulmonary/Chest: Effort normal and breath sounds normal. She has no rales.  Abdominal: Soft. Bowel sounds are normal. There is no tenderness. There is no rebound and no guarding.  Musculoskeletal: Normal range of motion.  Neurological: She is alert and oriented to person, place, and time.  Skin: Skin is warm and dry. There is erythema.     Psychiatric: She has a normal mood and affect.    ED Course  Procedures (including critical care time)  Labs Reviewed  CBC - Abnormal; Notable for the following:    WBC 22.0 (*)     RBC 3.35 (*)     Hemoglobin 9.2 (*)     HCT 28.8 (*)     RDW 16.7 (*)     All other components within normal limits  DIFFERENTIAL - Abnormal; Notable for the following:    Neutrophils Relative 92 (*)     Neutro Abs 20.3 (*)     Lymphocytes Relative 3 (*)     All other components within normal limits  POCT I-STAT, CHEM 8 - Abnormal; Notable for the following:    Chloride 92 (*)     BUN 32 (*)     Creatinine, Ser 1.40 (*)     Glucose, Bld 147 (*)     Calcium, Ion 1.06 (*)     Hemoglobin 10.5 (*)     HCT 31.0 (*)     All other components within normal limits  CULTURE, BLOOD (ROUTINE X 2)  CULTURE, BLOOD (ROUTINE X 2)  URINALYSIS, ROUTINE W REFLEX MICROSCOPIC  URINE CULTURE   Dg Chest 1 View  11/12/2011  *RADIOLOGY REPORT*  Clinical Data: Shortness of breath and right leg pain.  CHEST - 1 VIEW  Comparison: 09/24/2011  Findings: Stable  appearance of cardiac pacemaker.  Shallow inspiration.  Cardiac enlargement with mild pulmonary vascular congestion.  No significant edema.  Atelectasis in the left lung base.  No blunting of costophrenic angles.  No focal airspace consolidation.  No pneumothorax.  IMPRESSION: Cardiac enlargement with mild pulmonary vascular congestion. Atelectasis in the left base.  Original Report Authenticated By: Marlon Pel, M.D.     No diagnosis found.    MDM  Admit for cellulitis       Lynnox Girten K Mischa Pollard-Rasch, MD 11/12/11 1610

## 2011-11-12 NOTE — Progress Notes (Signed)
Patient ID: [name]  female  RUE:[id number]    DOB: Jun 23, 1955    DOA: 11/12/2011  PCP: Evlyn Courier, MD  Subjective: Resting, no specific complaints at this time, family member at bedside  Objective: Weight change:  No intake or output data in the 24 hours ending 11/12/11 1819 Blood pressure 105/58, pulse 75, temperature 98.4 F (36.9 C), temperature source Oral, resp. rate 20, height 5' (1.524 m), weight 187.472 kg (413 lb 4.8 oz), SpO2 97.00%.  Physical Exam: General: Alert and awake, oriented x3, not in any acute distress. HEENT: anicteric sclera, pupils reactive to light and accommodation, EOMI CVS: S1-S2 clear, no murmur rubs or gallops Chest: clear to auscultation bilaterally, no wheezing, rales or rhonchi Abdomen: Morbidly obese, nontender nondistended Extremities: Morbidly obese with lymphedema, right leg erythema, tenderness Neuro: Cranial nerves II-XII intact, no focal neurological deficits  Lab Results: Basic Metabolic Panel:  Lab 11/12/11 1478 11/12/11 0332  NA -- 138  K -- 3.5  CL -- 92*  CO2 -- --  GLUCOSE -- 147*  BUN -- 32*  CREATININE 1.36* 1.40*  CALCIUM -- --  MG -- --  PHOS -- --     Lab 11/12/11 0332 11/12/11 0249  WBC -- 22.0*  NEUTROABS -- 20.3*  HGB 10.5* 9.2*  HCT 31.0* 28.8*  MCV -- 86.0  PLT -- 216  CBG:  Lab 11/12/11 1636 11/12/11 1152  GLUCAP 144* 161*     Micro Results: No results found for this or any previous visit (from the past 240 hour(s)).  Studies/Results: Dg Chest 1 View  11/12/2011  *RADIOLOGY REPORT*  Clinical Data: Shortness of breath and right leg pain.  CHEST - 1 VIEW  Comparison: 09/24/2011  Findings: Stable appearance of cardiac pacemaker.  Shallow inspiration.  Cardiac enlargement with mild pulmonary vascular congestion.  No significant edema.  Atelectasis in the left lung base.  No blunting of costophrenic angles.  No focal airspace consolidation.  No pneumothorax.  IMPRESSION: Cardiac enlargement with  mild pulmonary vascular congestion. Atelectasis in the left base.  Original Report Authenticated By: Marlon Pel, M.D.    Medications: Scheduled Meds:   . aspirin EC  81 mg Oral Daily  . cloNIDine  0.2 mg Oral BID  . enoxaparin (LOVENOX) injection  90 mg Subcutaneous Q24H  . ferrous sulfate  325 mg Oral Daily  . furosemide  120 mg Oral Daily  . hydrALAZINE  100 mg Oral BID  . insulin aspart  0-5 Units Subcutaneous QHS  . insulin aspart  0-9 Units Subcutaneous TID WC  . insulin aspart protamine-insulin aspart  20 Units Subcutaneous Q breakfast  . multivitamin with minerals  1 tablet Oral Daily  . nystatin cream  1 application Topical BID  . piperacillin-tazobactam  3.375 g Intravenous Once  . piperacillin-tazobactam (ZOSYN)  IV  3.375 g Intravenous Q8H  . sodium chloride  3 mL Intravenous Q12H  . vancomycin  1,500 mg Intravenous Once  . vancomycin  2,000 mg Intravenous Q24H  . vancomycin  1,000 mg Intravenous Once  . vitamin C  500 mg Oral Daily  . DISCONTD: enoxaparin  40 mg Subcutaneous Q24H  . DISCONTD: hydrALAZINE  100 mg Oral BID  . DISCONTD: multivitamins ther. w/minerals  1 tablet Oral Daily  . DISCONTD: piperacillin-tazobactam (ZOSYN)  IV  3.375 g Intravenous Q6H  . DISCONTD: vancomycin  1,000 mg Intravenous Q12H   Continuous Infusions:    Assessment/Plan: Principal Problem:  *Cellulitis with massive lymphedema-  - Continue current  management with IV vancomycin, Zosyn - Doppler ultrasound of the right lower extremities negative for DVT  Diabetes mellitus: Obtain HbA1c, place on sliding scale insulin and NovoLog 70/30 her home dose   COPD : Stable   History of diastolic CHF: Continue Lasix, Coreg, hydralazine  DVT Prophylaxis: continue Lovenox   Code Status:  Disposition: not medically ready   LOS: 0 days   Tavie Haseman M.D. [facility] 11/12/2011, 6:19 PM Pager: [phone]  If 7PM-7AM, please contact  night-coverage [email or web address] Password [email or web address]

## 2011-11-12 NOTE — ED Notes (Signed)
Pt by EMS from home went to [facility] and diverted here per EMS by Dr.Strand for evaluation for DVT to right leg. Pt has redness and unknown how much swelling to right posterior/lateral knee. Pt is morbid obese, has weak palpable pedal pulse. Pt reports having new shortness of breath, pt uses oxygen at home 2L. Pt reports having leg pain since yesterday and headache x 2 days, denies any n/v/d.

## 2011-11-12 NOTE — H&P (Signed)
[name] is an 56 y.o. female.    Unassigned  Chief Complaint: cellulitis HPI: 56 yo female with hx of dm2, htn, CHF (diastolic) has c/o right lower extremity redness and cellulitis starting yesterday.  Right > left leg swelling per pt.   Denies fever.     Past Medical History  Diagnosis Date  . Morbid obesity   . Diabetes mellitus   . Hypertension   . CHF (congestive heart failure)     Diastolic;   . Pacemaker   . COPD (chronic obstructive pulmonary disease)   . Oxygen dependent   . Pickwickian syndrome   . Chronic back pain   . Coronary artery disease   . Shortness of breath   . Obesity hypoventilation syndrome   . Myocardial infarction   . Arthritis   . Hiatal hernia   . Tachy-brady syndrome     s/p pacemaker 2008  . Cellulitis     Recurrent bilateral LE  . Respiratory failure, acute 12/2009    Sec to diastolic CHF and COPD  . Constipation 04/02/2011    Past Surgical History  Procedure Date  . Abdominal hysterectomy   . Pacemaker insertion     Family History  Problem Relation Age of Onset  . Coronary artery disease Mother     MI age 38, died.  . Hypertension Father   . Osteoarthritis Father   . Hypertension Sister   . Hypertension Brother   . Obesity Father    Social History:  reports that she quit smoking about 44 years ago. Her smoking use included Cigarettes. She has a .05 pack-year smoking history. She has never used smokeless tobacco. She reports that she does not drink alcohol or use illicit drugs.  Allergies: No Known Allergies   (Not in a hospital admission)  Results for orders placed during the hospital encounter of 11/12/11 (from the past 48 hour(s))  CBC     Status: Abnormal   Collection Time   11/12/11  2:49 AM      Component Value Range Comment   WBC 22.0 (*) 4.0 - 10.5 K/uL    RBC 3.35 (*) 3.87 - 5.11 MIL/uL    Hemoglobin 9.2 (*) 12.0 - 15.0 g/dL    HCT 40.9 (*) 81.1 - 46.0 %    MCV 86.0  78.0 - 100.0 fL    MCH 27.5  26.0 -  34.0 pg    MCHC 31.9  30.0 - 36.0 g/dL    RDW 91.4 (*) 78.2 - 15.5 %    Platelets 216  150 - 400 K/uL   DIFFERENTIAL     Status: Abnormal   Collection Time   11/12/11  2:49 AM      Component Value Range Comment   Neutrophils Relative 92 (*) 43 - 77 %    Neutro Abs 20.3 (*) 1.7 - 7.7 K/uL    Lymphocytes Relative 3 (*) 12 - 46 %    Lymphs Abs 0.8  0.7 - 4.0 K/uL    Monocytes Relative 4  3 - 12 %    Monocytes Absolute 0.8  0.1 - 1.0 K/uL    Eosinophils Relative 1  0 - 5 %    Eosinophils Absolute 0.1  0.0 - 0.7 K/uL    Basophils Relative 0  0 - 1 %    Basophils Absolute 0.0  0.0 - 0.1 K/uL   POCT I-STAT, CHEM 8     Status: Abnormal   Collection Time   11/12/11  3:32 AM      Component Value Range Comment   Sodium 138  135 - 145 mEq/L    Potassium 3.5  3.5 - 5.1 mEq/L    Chloride 92 (*) 96 - 112 mEq/L    BUN 32 (*) 6 - 23 mg/dL    Creatinine, Ser 4.54 (*) 0.50 - 1.10 mg/dL    Glucose, Bld 098 (*) 70 - 99 mg/dL    Calcium, Ion 1.19 (*) 1.12 - 1.32 mmol/L    TCO2 34  0 - 100 mmol/L    Hemoglobin 10.5 (*) 12.0 - 15.0 g/dL    HCT 14.7 (*) 82.9 - 46.0 %    Dg Chest 1 View  11/12/2011  *RADIOLOGY REPORT*  Clinical Data: Shortness of breath and right leg pain.  CHEST - 1 VIEW  Comparison: 09/24/2011  Findings: Stable appearance of cardiac pacemaker.  Shallow inspiration.  Cardiac enlargement with mild pulmonary vascular congestion.  No significant edema.  Atelectasis in the left lung base.  No blunting of costophrenic angles.  No focal airspace consolidation.  No pneumothorax.  IMPRESSION: Cardiac enlargement with mild pulmonary vascular congestion. Atelectasis in the left base.  Original Report Authenticated By: Marlon Pel, M.D.    Review of Systems  Constitutional: Positive for chills. Negative for fever, weight loss, malaise/fatigue and diaphoresis.  HENT: Negative for hearing loss, ear pain, nosebleeds, congestion, sore throat, neck pain, tinnitus and ear discharge.   Eyes:  Negative for blurred vision, double vision, photophobia, pain, discharge and redness.  Respiratory: Positive for shortness of breath. Negative for cough, hemoptysis, sputum production, wheezing and stridor.   Cardiovascular: Negative for chest pain, palpitations, orthopnea, claudication, leg swelling and PND.  Gastrointestinal: Negative for heartburn, nausea, vomiting, abdominal pain, diarrhea, constipation, blood in stool and melena.  Genitourinary: Negative for dysuria, urgency, frequency, hematuria and flank pain.  Musculoskeletal: Negative for myalgias, back pain, joint pain and falls.  Skin: Positive for rash. Negative for itching.  Neurological: Negative for dizziness, tingling, tremors, sensory change, speech change, focal weakness, seizures, loss of consciousness, weakness and headaches.  Endo/Heme/Allergies: Negative for environmental allergies and polydipsia. Does not bruise/bleed easily.  Psychiatric/Behavioral: Negative for depression, suicidal ideas, hallucinations, memory loss and substance abuse. The patient is not nervous/anxious and does not have insomnia.     Blood pressure 124/80, temperature 98.3 F (36.8 C), temperature source Oral, resp. rate 28, height 5' (1.524 m), weight 181.439 kg (400 lb), SpO2 98.00%. Physical Exam  Constitutional: She is oriented to person, place, and time. She appears well-developed and well-nourished. No distress.  HENT:  Head: Normocephalic and atraumatic.  Mouth/Throat: No oropharyngeal exudate.  Eyes: Conjunctivae and EOM are normal. Pupils are equal, round, and reactive to light. Right eye exhibits no discharge. Left eye exhibits no discharge. No scleral icterus.  Neck: Normal range of motion. Neck supple. No JVD present. No tracheal deviation present. No thyromegaly present.  Cardiovascular: Normal rate and regular rhythm.  Exam reveals friction rub. Exam reveals no gallop.   No murmur heard. Respiratory: Effort normal and breath sounds  normal. No stridor. No respiratory distress. She has no wheezes. She has no rales. She exhibits no tenderness.  GI: Soft. Bowel sounds are normal. She exhibits no distension and no mass. There is no tenderness. There is no rebound and no guarding.  Musculoskeletal: Normal range of motion. She exhibits no edema and no tenderness.  Lymphadenopathy:    She has no cervical adenopathy.  Neurological: She is alert and oriented to  person, place, and time. She has normal reflexes. She displays normal reflexes. No cranial nerve deficit. She exhibits normal muscle tone. Coordination normal.  Skin: Skin is warm and dry. No rash noted. She is not diaphoretic. No erythema. No pallor.  Psychiatric: She has a normal mood and affect. Her behavior is normal. Judgment and thought content normal.     Assessment/Plan Cellulitis:  vanco iv, zosyniv, blood cx x2 sets pending  RLE swelling: D-dimer,  U/s r/o dvt  Dm2 fsbs ac and qhs, iss, humalog 75/25=>novolog 70/30  Copd: Cont albuterol  CHF (diastolic), cont lasix, carvedilol, hydralazine   Chisom Aust 11/12/2011, 4:59 AM

## 2011-11-13 DIAGNOSIS — M7989 Other specified soft tissue disorders: Secondary | ICD-10-CM

## 2011-11-13 DIAGNOSIS — E1165 Type 2 diabetes mellitus with hyperglycemia: Secondary | ICD-10-CM

## 2011-11-13 DIAGNOSIS — E118 Type 2 diabetes mellitus with unspecified complications: Secondary | ICD-10-CM

## 2011-11-13 DIAGNOSIS — L03039 Cellulitis of unspecified toe: Secondary | ICD-10-CM

## 2011-11-13 DIAGNOSIS — L02619 Cutaneous abscess of unspecified foot: Secondary | ICD-10-CM

## 2011-11-13 LAB — CBC
Hemoglobin: 8.7 g/dL — ABNORMAL LOW (ref 12.0–15.0)
MCH: 26.9 pg (ref 26.0–34.0)
RBC: 3.24 MIL/uL — ABNORMAL LOW (ref 3.87–5.11)
WBC: 10 10*3/uL (ref 4.0–10.5)

## 2011-11-13 LAB — BASIC METABOLIC PANEL
CO2: 36 mEq/L — ABNORMAL HIGH (ref 19–32)
Calcium: 9 mg/dL (ref 8.4–10.5)
Chloride: 94 mEq/L — ABNORMAL LOW (ref 96–112)
Glucose, Bld: 166 mg/dL — ABNORMAL HIGH (ref 70–99)
Sodium: 138 mEq/L (ref 135–145)

## 2011-11-13 LAB — GLUCOSE, CAPILLARY: Glucose-Capillary: 151 mg/dL — ABNORMAL HIGH (ref 70–99)

## 2011-11-13 LAB — URINE CULTURE
Colony Count: NO GROWTH
Culture  Setup Time: 201306240615
Culture: NO GROWTH

## 2011-11-13 NOTE — Progress Notes (Signed)
Patient ID: [name]  female  FAO:[id number]    DOB: 1955/06/03    DOA: 11/12/2011  PCP: Evlyn Courier, MD  Subjective: Pleasant and cooperative, cellulitis on the right lower extremity improving, significant lymphedema  Objective: Weight change: 6.033 kg (13 lb 4.8 oz)  Intake/Output Summary (Last 24 hours) at 11/13/11 1217 Last data filed at 11/12/11 2300  Gross per 24 hour  Intake    820 ml  Output      1 ml  Net    819 ml   Blood pressure 150/94, pulse 67, temperature 97.7 F (36.5 C), temperature source Oral, resp. rate 20, height 5' (1.524 m), weight 187.472 kg (413 lb 4.8 oz), SpO2 96.00%.  Physical Exam: General: Alert and awake, oriented x3, not in any acute distress. HEENT: anicteric sclera, pupils reactive to light and accommodation, EOMI CVS: S1-S2 clear, no murmur rubs or gallops Chest: clear to auscultation bilaterally, no wheezing, rales or rhonchi Abdomen: Morbidly obese, nontender  Extremities: Morbidly obese with lymphedema, right leg erythema, improving, still tenderness + Neuro: Cranial nerves II-XII intact, no focal neurological deficits  Lab Results: Basic Metabolic Panel:  Lab 11/12/11 6962 11/12/11 0332  NA -- 138  K -- 3.5  CL -- 92*  CO2 -- --  GLUCOSE -- 147*  BUN -- 32*  CREATININE 1.36* 1.40*  CALCIUM -- --  MG -- --  PHOS -- --     Lab 11/12/11 0332 11/12/11 0249  WBC -- 22.0*  NEUTROABS -- 20.3*  HGB 10.5* 9.2*  HCT 31.0* 28.8*  MCV -- 86.0  PLT -- 216  CBG:  Lab 11/13/11 1138 11/13/11 0608 11/12/11 2154 11/12/11 1636 11/12/11 1152  GLUCAP 164* 151* 162* 144* 161*     Micro Results: Recent Results (from the past 240 hour(s))  CULTURE, BLOOD (ROUTINE X 2)     Status: Normal (Preliminary result)   Collection Time   11/12/11  2:45 AM      Component Value Range Status Comment   Specimen Description BLOOD RIGHT HAND   Final    Special Requests BOTTLES DRAWN AEROBIC AND ANAEROBIC 10CC   Final    Culture  Setup Time  [id number]   Final    Culture     Final    Value:        BLOOD CULTURE RECEIVED NO GROWTH TO DATE CULTURE WILL BE HELD FOR 5 DAYS BEFORE ISSUING A FINAL NEGATIVE REPORT   Report Status PENDING   Incomplete   CULTURE, BLOOD (ROUTINE X 2)     Status: Normal (Preliminary result)   Collection Time   11/12/11  3:15 AM      Component Value Range Status Comment   Specimen Description BLOOD LEFT ARM   Final    Special Requests     Final    Value: BOTTLES DRAWN AEROBIC AND ANAEROBIC 3CC AEROBIC 2CC ANAEROBIC   Culture  Setup Time [id number]   Final    Culture     Final    Value:        BLOOD CULTURE RECEIVED NO GROWTH TO DATE CULTURE WILL BE HELD FOR 5 DAYS BEFORE ISSUING A FINAL NEGATIVE REPORT   Report Status PENDING   Incomplete   URINE CULTURE     Status: Normal   Collection Time   11/12/11  4:45 AM      Component Value Range Status Comment   Specimen Description URINE, CATHETERIZED   Final    Special Requests NONE  Final    Culture  Setup Time [id number]   Final    Colony Count NO GROWTH   Final    Culture NO GROWTH   Final    Report Status 11/13/2011 FINAL   Final     Studies/Results: Dg Chest 1 View  11/12/2011  *RADIOLOGY REPORT*  Clinical Data: Shortness of breath and right leg pain.  CHEST - 1 VIEW  Comparison: 09/24/2011  Findings: Stable appearance of cardiac pacemaker.  Shallow inspiration.  Cardiac enlargement with mild pulmonary vascular congestion.  No significant edema.  Atelectasis in the left lung base.  No blunting of costophrenic angles.  No focal airspace consolidation.  No pneumothorax.  IMPRESSION: Cardiac enlargement with mild pulmonary vascular congestion. Atelectasis in the left base.  Original Report Authenticated By: Marlon Pel, M.D.    Medications: Scheduled Meds:    . aspirin EC  81 mg Oral Daily  . cloNIDine  0.2 mg Oral BID  . enoxaparin (LOVENOX) injection  90 mg Subcutaneous Q24H  . ferrous sulfate  325 mg Oral Daily  . furosemide  120 mg  Oral Daily  . hydrALAZINE  100 mg Oral BID  . insulin aspart  0-5 Units Subcutaneous QHS  . insulin aspart  0-9 Units Subcutaneous TID WC  . insulin aspart protamine-insulin aspart  20 Units Subcutaneous Q breakfast  . multivitamin with minerals  1 tablet Oral Daily  . nystatin cream  1 application Topical BID  . piperacillin-tazobactam (ZOSYN)  IV  3.375 g Intravenous Q8H  . sodium chloride  3 mL Intravenous Q12H  . vancomycin  1,500 mg Intravenous Once  . vancomycin  2,000 mg Intravenous Q24H  . vitamin C  500 mg Oral Daily   Continuous Infusions:    Assessment/Plan: Principal Problem:  *Recurrent Cellulitis with massive lymphedema- improving - Continue current management with IV vancomycin, Zosyn - Doppler ultrasound of the right lower extremities negative for DVT  Diabetes mellitus:  - Hemoglobin A1c 7.8  - Continue sliding scale insulin and NovoLog 70/30 her home dose   COPD : Stable   History of diastolic CHF: Continue Lasix, Coreg, hydralazine  DVT Prophylaxis: continue Lovenox   Code Status:  Disposition: not medically ready. Will transition to oral antibiotics in next 48 hours depending on improvement. Will probably benefit from lymphedema clinic, will discuss with case management.  LOS: 1 day   Jaziah Kwasnik M.D. [facility] 11/13/2011, 12:17 PM Pager: [phone]  If 7PM-7AM, please contact night-coverage [email or web address] Password [email or web address]

## 2011-11-13 NOTE — Care Management Note (Unsigned)
    Page 1 of 2   11/15/2011     1:46:16 PM   CARE MANAGEMENT NOTE 11/15/2011  Patient:  Kalter,Rheannon N   Account Number:  [id number]  Date Initiated:  11/13/2011  Documentation initiated by:  SIMMONS,Ademola Vert  Subjective/Objective Assessment:   ADMITTED WITH RLE CELLULITIS; LIVES AT HOME WITH HUSBAND AND 2 DAUGHTERS IN [place]; SHE IS BED RIDDEN- HAS HOSP BED, HOME O2 ([facility]), CPAP, NEBULIZER; USES [facility] IN [place] FOR RX.     Action/Plan:   DISCHARGE PLANNING DISCUSSED AT BEDSIDE.   Anticipated DC Date:  11/16/2011   Anticipated DC Plan:  HOME W HOME HEALTH SERVICES  In-house referral  Financial Counselor      DC Planning Services  CM consult      [facility] Choice  HOME HEALTH   Choice offered to / List presented to:  C-1 Patient        HH arranged  HH-1 RN      [facility] agency  [facility]   Status of service:  In process, will continue to follow Medicare Important Message given?   (If response is "NO", the following Medicare IM given date fields will be blank) Date Medicare IM given:   Date Additional Medicare IM given:    Discharge Disposition:    Per UR Regulation:  Reviewed for med. necessity/level of care/duration of stay  If discussed at Long Length of Stay Meetings, dates discussed:    Comments:  11/15/11  1342  Dillie Burandt SIMMONS RN, BSN [phone] REFERRAL PLACED TO [facility] WITH [facility] FOR HHRN /PT; HOWEVER, DUE TO INSURANCE, PT NOT AN OPTION; SOC DATE FOR HHRN: WITHIN 24-48 HRS POST D/C; NCM FOLLOWING.   11/15/11  1106  Bobbi Kozakiewicz SIMMONS RN, BSN [phone] PROVIDED PT WITH CONTACT FOR PCP - [facility]; PT WILL NEED TO ARRANGE OWN TRANSPORTATION TO AND FROM;  MD- PLEASE ORDER HHRN;  NCM WILL FOLLOW.    11/13/11  0851  Nichols Corter SIMMONS RN, BSN [phone] SHOWING SELF- PAY- NCM CONTACTED FINANCIAL COUNSELOR, ASHLEY- WHO STATED SHE WOULD SPEAK WITH PT ABOUT MEDICAID; PT WILL NEED TO BE DISCHARGED HOME BY AMBULANCE. NCM WILL  FOLLOW.

## 2011-11-14 DIAGNOSIS — L02619 Cutaneous abscess of unspecified foot: Secondary | ICD-10-CM

## 2011-11-14 DIAGNOSIS — E1165 Type 2 diabetes mellitus with hyperglycemia: Secondary | ICD-10-CM

## 2011-11-14 DIAGNOSIS — E118 Type 2 diabetes mellitus with unspecified complications: Secondary | ICD-10-CM

## 2011-11-14 DIAGNOSIS — M7989 Other specified soft tissue disorders: Secondary | ICD-10-CM

## 2011-11-14 DIAGNOSIS — L03039 Cellulitis of unspecified toe: Secondary | ICD-10-CM

## 2011-11-14 LAB — CULTURE, BLOOD (ROUTINE X 2): Culture  Setup Time: 201306240929

## 2011-11-14 LAB — BASIC METABOLIC PANEL
BUN: 29 mg/dL — ABNORMAL HIGH (ref 6–23)
CO2: 37 mEq/L — ABNORMAL HIGH (ref 19–32)
Calcium: 9.3 mg/dL (ref 8.4–10.5)
Chloride: 97 mEq/L (ref 96–112)
Creatinine, Ser: 1.35 mg/dL — ABNORMAL HIGH (ref 0.50–1.10)
Glucose, Bld: 167 mg/dL — ABNORMAL HIGH (ref 70–99)

## 2011-11-14 LAB — GLUCOSE, CAPILLARY
Glucose-Capillary: 152 mg/dL — ABNORMAL HIGH (ref 70–99)
Glucose-Capillary: 135 mg/dL — ABNORMAL HIGH (ref 70–99)
Glucose-Capillary: 142 mg/dL — ABNORMAL HIGH (ref 70–99)

## 2011-11-14 LAB — CBC
HCT: 29.2 % — ABNORMAL LOW (ref 36.0–46.0)
Hemoglobin: 8.9 g/dL — ABNORMAL LOW (ref 12.0–15.0)
MCH: 27.1 pg (ref 26.0–34.0)
MCV: 89 fL (ref 78.0–100.0)
RBC: 3.28 MIL/uL — ABNORMAL LOW (ref 3.87–5.11)
WBC: 8.1 10*3/uL (ref 4.0–10.5)

## 2011-11-14 MED ORDER — DOXYCYCLINE HYCLATE 100 MG PO TABS
100.0000 mg | ORAL_TABLET | Freq: Two times a day (BID) | ORAL | Status: DC
  Administered 2011-11-14 – 2011-11-15 (×2): 100 mg via ORAL
  Filled 2011-11-14 (×4): qty 1

## 2011-11-14 MED ORDER — CLONIDINE HCL 0.2 MG PO TABS
0.2000 mg | ORAL_TABLET | Freq: Three times a day (TID) | ORAL | Status: DC
  Administered 2011-11-14 – 2011-11-15 (×3): 0.2 mg via ORAL
  Filled 2011-11-14 (×5): qty 1

## 2011-11-14 MED ORDER — HYDRALAZINE HCL 20 MG/ML IJ SOLN
10.0000 mg | Freq: Four times a day (QID) | INTRAMUSCULAR | Status: DC | PRN
  Administered 2011-11-14 – 2011-11-15 (×2): 10 mg via INTRAVENOUS
  Filled 2011-11-14 (×2): qty 0.5

## 2011-11-14 MED ORDER — SODIUM CHLORIDE 0.9 % IV SOLN
INTRAVENOUS | Status: AC
  Administered 2011-11-14 (×2): via INTRAVENOUS

## 2011-11-14 MED ORDER — CEFUROXIME AXETIL 500 MG PO TABS
500.0000 mg | ORAL_TABLET | Freq: Two times a day (BID) | ORAL | Status: DC
  Administered 2011-11-14 – 2011-11-15 (×2): 500 mg via ORAL
  Filled 2011-11-14 (×4): qty 1

## 2011-11-14 NOTE — Progress Notes (Signed)
Subjective: Doing better today.  No specific concerns.  Objective: Vital signs in last 24 hours: Filed Vitals:   11/13/11 1942 11/14/11 0622 11/14/11 1124 11/14/11 1348  BP: 154/92 157/91  189/107  Pulse: 75 66  69  Temp: 98.7 F (37.1 C) 98 F (36.7 C)  98.7 F (37.1 C)  TempSrc: Oral Oral  Oral  Resp: 20 20  20   Height:      Weight:      SpO2: 97% 96% 95% 97%   Weight change:   Intake/Output Summary (Last 24 hours) at 11/14/11 1521 Last data filed at 11/14/11 0900  Gross per 24 hour  Intake    460 ml  Output    500 ml  Net    -40 ml    Physical Exam: General: Awake, Oriented, No acute distress. HEENT: EOMI. Neck: Supple CV: S1 and S2 Lungs: Clear to ascultation bilaterally Abdomen: Soft, Nontender, Nondistended, +bowel sounds. Ext: Good pulses.  Significant lymphedema, erythema noted in the right lower extremity measuring 5 x 5 cm.  Lab Results: Basic Metabolic Panel:  Lab 11/14/11 1610 11/13/11 1320 11/12/11 0511 11/12/11 0332  NA 143 138 -- 138  K 3.9 3.6 -- 3.5  CL 97 94* -- 92*  CO2 37* 36* -- --  GLUCOSE 167* 166* -- 147*  BUN 29* 31* -- 32*  CREATININE 1.35* 1.47* 1.36* 1.40*  CALCIUM 9.3 9.0 -- --  MG -- -- -- --  PHOS -- -- -- --   Liver Function Tests: No results found for this basename: AST:5,ALT:5,ALKPHOS:5,BILITOT:5,PROT:5,ALBUMIN:5 in the last 168 hours No results found for this basename: LIPASE:5,AMYLASE:5 in the last 168 hours No results found for this basename: AMMONIA:5 in the last 168 hours CBC:  Lab 11/14/11 0500 11/13/11 1320 11/12/11 0332 11/12/11 0249  WBC 8.1 10.0 -- 22.0*  NEUTROABS -- -- -- 20.3*  HGB 8.9* 8.7* 10.5* 9.2*  HCT 29.2* 28.3* 31.0* 28.8*  MCV 89.0 87.3 -- 86.0  PLT 226 204 -- 216   Cardiac Enzymes: No results found for this basename: CKTOTAL:5,CKMB:5,CKMBINDEX:5,TROPONINI:5 in the last 168 hours BNP (last 3 results)  Basename 12/01/10 0827  PROBNP 106.2   CBG:  Lab 11/14/11 1121 11/14/11 0618 11/13/11  2112 11/13/11 1624 11/13/11 1138  GLUCAP 181* 152* 149* 160* 164*    Basename 11/12/11 0838  HGBA1C 7.8*   Other Labs: No components found with this basename: POCBNP:3  Lab 11/12/11 0511  DDIMER 1.56*   No results found for this basename: CHOL:2,HDL:2,LDLCALC:2,TRIG:2,CHOLHDL:2,LDLDIRECT:2 in the last 168 hours No results found for this basename: TSH,T4TOTAL,FREET3,T3FREE,FREET4,THYROIDAB in the last 168 hours No results found for this basename: VITAMINB12:2,FOLATE:2,FERRITIN:2,TIBC:2,IRON:2,RETICCTPCT:2 in the last 168 hours  Micro Results: Recent Results (from the past 240 hour(s))  CULTURE, BLOOD (ROUTINE X 2)     Status: Normal (Preliminary result)   Collection Time   11/12/11  2:45 AM      Component Value Range Status Comment   Specimen Description BLOOD RIGHT HAND   Final    Special Requests BOTTLES DRAWN AEROBIC AND ANAEROBIC 10CC   Final    Culture  Setup Time [id number]   Final    Culture     Final    Value:        BLOOD CULTURE RECEIVED NO GROWTH TO DATE CULTURE WILL BE HELD FOR 5 DAYS BEFORE ISSUING A FINAL NEGATIVE REPORT   Report Status PENDING   Incomplete   CULTURE, BLOOD (ROUTINE X 2)     Status: Normal  Collection Time   11/12/11  3:15 AM      Component Value Range Status Comment   Specimen Description BLOOD LEFT ARM   Final    Special Requests     Final    Value: BOTTLES DRAWN AEROBIC AND ANAEROBIC 3CC AEROBIC 2CC ANAEROBIC   Culture  Setup Time [id number]   Final    Culture     Final    Value: STAPHYLOCOCCUS SPECIES (COAGULASE NEGATIVE)     Note: THE SIGNIFICANCE OF ISOLATING THIS ORGANISM FROM A SINGLE SET OF BLOOD CULTURES WHEN MULTIPLE SETS ARE DRAWN IS UNCERTAIN. PLEASE NOTIFY THE MICROBIOLOGY DEPARTMENT WITHIN ONE WEEK IF SPECIATION AND SENSITIVITIES ARE REQUIRED.     Note: Gram Stain Report Called to,Read Back By and Verified With: NEGO CROSSON @1215  11/13/11 BY KRAWS   Report Status 11/14/2011 FINAL   Final   URINE CULTURE     Status: Normal    Collection Time   11/12/11  4:45 AM      Component Value Range Status Comment   Specimen Description URINE, CATHETERIZED   Final    Special Requests NONE   Final    Culture  Setup Time [id number]   Final    Colony Count NO GROWTH   Final    Culture NO GROWTH   Final    Report Status 11/13/2011 FINAL   Final     Studies/Results: No results found.  Medications: I have reviewed the patient's current medications. Scheduled Meds:   . aspirin EC  81 mg Oral Daily  . cefUROXime  500 mg Oral BID WC  . cloNIDine  0.2 mg Oral TID  . doxycycline  100 mg Oral Q12H  . enoxaparin (LOVENOX) injection  90 mg Subcutaneous Q24H  . ferrous sulfate  325 mg Oral Daily  . furosemide  120 mg Oral Daily  . hydrALAZINE  100 mg Oral BID  . insulin aspart  0-5 Units Subcutaneous QHS  . insulin aspart  0-9 Units Subcutaneous TID WC  . insulin aspart protamine-insulin aspart  20 Units Subcutaneous Q breakfast  . multivitamin with minerals  1 tablet Oral Daily  . nystatin cream  1 application Topical BID  . sodium chloride  3 mL Intravenous Q12H  . vitamin C  500 mg Oral Daily  . DISCONTD: cloNIDine  0.2 mg Oral BID  . DISCONTD: piperacillin-tazobactam (ZOSYN)  IV  3.375 g Intravenous Q8H  . DISCONTD: vancomycin  2,000 mg Intravenous Q24H   Continuous Infusions:   . sodium chloride     PRN Meds:.acetaminophen, albuterol, HYDROmorphone (DILAUDID) injection, oxyCODONE-acetaminophen  Assessment/Plan: Right lower extremity cellulitis with massive lymphedema Transitional vancomycin and Zosyn to doxycycline and cefuroxime.antibiotics since 11/12/2011.  Doppler of right lower she been negative for DVT.    Insulin dependent diabetes mellitus type II uncontrolled with complications HbA1c 7.8. Continue sliding scale insulin and NovoLog 70/30 her home dose.  COPD Stable   History of diastolic CHF Continue Lasix, Clonidine, hydralazine.  Patient appears compensated at this  time.  Hypertension Uncontrolled.  Increase the dose of clonidine from twice a day to 3 times a day.  Continue Coreg, hydralazine, and furosemide.  Acute renal failure on chronic kidney disease stage II Suspect creatinine may be elevated from diuresis.  Will gently rehydrate the patient.  Questionable bacteremia One of 2 bottles growing coag negative staph likely contaminant.  Discontinue vancomycin.  Morbid obesity Diet and exercise as outpatient.  DVT Prophylaxis Lovenox   Code Status Full code.  Disposition  Discharge the patient in 1-2 days.  Will arrange a primary care physician for her.  However given her morbid obesity patient has difficulty with transportation in getting to primary care physician's office.   LOS: 2 days  Sumaya Riedesel A, MD 11/14/2011, 3:21 PM

## 2011-11-15 ENCOUNTER — Encounter: Payer: Medicaid Other | Admitting: *Deleted

## 2011-11-15 DIAGNOSIS — E118 Type 2 diabetes mellitus with unspecified complications: Secondary | ICD-10-CM

## 2011-11-15 DIAGNOSIS — L03039 Cellulitis of unspecified toe: Secondary | ICD-10-CM

## 2011-11-15 DIAGNOSIS — E1165 Type 2 diabetes mellitus with hyperglycemia: Secondary | ICD-10-CM

## 2011-11-15 DIAGNOSIS — L02619 Cutaneous abscess of unspecified foot: Secondary | ICD-10-CM

## 2011-11-15 DIAGNOSIS — M7989 Other specified soft tissue disorders: Secondary | ICD-10-CM

## 2011-11-15 LAB — CBC
MCV: 87 fL (ref 78.0–100.0)
Platelets: 235 10*3/uL (ref 150–400)
RBC: 3.3 MIL/uL — ABNORMAL LOW (ref 3.87–5.11)
WBC: 8.9 10*3/uL (ref 4.0–10.5)

## 2011-11-15 LAB — GLUCOSE, CAPILLARY
Glucose-Capillary: 149 mg/dL — ABNORMAL HIGH (ref 70–99)
Glucose-Capillary: 138 mg/dL — ABNORMAL HIGH (ref 70–99)

## 2011-11-15 LAB — BASIC METABOLIC PANEL
CO2: 35 mEq/L — ABNORMAL HIGH (ref 19–32)
Chloride: 96 mEq/L (ref 96–112)
Creatinine, Ser: 1.08 mg/dL (ref 0.50–1.10)
GFR calc Af Amer: 65 mL/min — ABNORMAL LOW (ref >90)
Potassium: 3.4 mEq/L — ABNORMAL LOW (ref 3.5–5.1)
Sodium: 141 mEq/L (ref 135–145)

## 2011-11-15 MED ORDER — HYDRALAZINE HCL 100 MG PO TABS: 100.0000 mg | ORAL_TABLET | Freq: Three times a day (TID) | ORAL | Status: DC

## 2011-11-15 MED ORDER — CARVEDILOL 25 MG PO TABS
25.0000 mg | ORAL_TABLET | Freq: Two times a day (BID) | ORAL | Status: DC
  Administered 2011-11-15: 25 mg via ORAL
  Filled 2011-11-15 (×3): qty 1

## 2011-11-15 MED ORDER — POTASSIUM CHLORIDE CRYS ER 20 MEQ PO TBCR
40.0000 meq | EXTENDED_RELEASE_TABLET | Freq: Once | ORAL | Status: AC
  Administered 2011-11-15: 40 meq via ORAL
  Filled 2011-11-15: qty 1

## 2011-11-15 MED ORDER — HYDRALAZINE HCL 50 MG PO TABS
100.0000 mg | ORAL_TABLET | Freq: Three times a day (TID) | ORAL | Status: DC
  Administered 2011-11-15: 100 mg via ORAL
  Filled 2011-11-15 (×3): qty 2

## 2011-11-15 MED ORDER — CLONIDINE HCL 0.2 MG PO TABS: 0.2000 mg | ORAL_TABLET | Freq: Three times a day (TID) | ORAL | Status: DC

## 2011-11-15 MED ORDER — OXYCODONE-ACETAMINOPHEN 5-325 MG PO TABS: 1.0000 | ORAL_TABLET | Freq: Four times a day (QID) | ORAL | Status: AC | PRN

## 2011-11-15 MED ORDER — DOXYCYCLINE HYCLATE 100 MG PO TABS: 100.0000 mg | ORAL_TABLET | Freq: Two times a day (BID) | ORAL | Status: AC

## 2011-11-15 MED ORDER — CEFUROXIME AXETIL 500 MG PO TABS: 500.0000 mg | ORAL_TABLET | Freq: Two times a day (BID) | ORAL | Status: AC

## 2011-11-15 NOTE — Discharge Summary (Signed)
Discharge Summary  [name] MR#: [id number]  DOB:02-16-1956  Date of Admission: 11/12/2011 Date of Discharge: 11/15/2011  Patient's PCP: Evlyn Courier, MD  Attending Physician:Kirstein Baxley A  Consults: None.  Discharge Diagnoses: Principal Problem:  *Cellulitis Active Problems:  DM (diabetes mellitus), type 2, uncontrolled with complications  HYPERLIPIDEMIA  OBESITY, MORBID  HYPERTENSION  GERD  Chronic diastolic heart failure  ARF (acute renal failure)   Brief Admitting History and Physical On admission "56 yo female with hx of dm2, htn, CHF (diastolic) has c/o right lower extremity redness and cellulitis starting yesterday. Right > left leg swelling per pt. Denies fever." presented on 11/12/2011.  Discharge Medications Medication List  As of 11/15/2011  2:35 PM   TAKE these medications         albuterol (2.5 MG/3ML) 0.083% nebulizer solution   Commonly known as: PROVENTIL   Take 2.5 mg by nebulization every 6 (six) hours as needed. For shortness of breath      ALEVE PO   Take 2 tablets by mouth daily as needed. For pain      aspirin 81 MG EC tablet   Take 1 tablet (81 mg total) by mouth daily.      carvedilol 25 MG tablet   Commonly known as: COREG   Take 1 tablet (25 mg total) by mouth 2 (two) times daily with a meal.      cefUROXime 500 MG tablet   Commonly known as: CEFTIN   Take 1 tablet (500 mg total) by mouth 2 (two) times daily with a meal.      cloNIDine 0.2 MG tablet   Commonly known as: CATAPRES   Take 1 tablet (0.2 mg total) by mouth 3 (three) times daily.      doxycycline 100 MG tablet   Commonly known as: VIBRA-TABS   Take 1 tablet (100 mg total) by mouth every 12 (twelve) hours.      ferrous sulfate 325 (65 FE) MG tablet   Take 325 mg by mouth daily.      furosemide 40 MG tablet   Commonly known as: LASIX   Take 120 mg by mouth daily. THE DOSE WAS DECREASED TO 1/2 TABLET 2 TIMES DAILY.      hydrALAZINE 100 MG tablet   Commonly  known as: APRESOLINE   Take 1 tablet (100 mg total) by mouth 3 (three) times daily.      insulin lispro protamine-insulin lispro (75-25) 100 UNIT/ML Susp   Commonly known as: HUMALOG 75/25   Inject 20 Units into the skin 3 (three) times daily.      multivitamins ther. w/minerals Tabs   Take 1 tablet by mouth daily.      neomycin-bacitracin-polymyxin [phone] ointment   APPLY TO YOUR LEGS ON EVERY Tuesday, Thursday, AND Saturday.      nystatin cream   Commonly known as: MYCOSTATIN   APPLY TO YOUR SKIN FOLDS AND LEGS EVERY Monday, Wednesday , AND Friday.      oxyCODONE-acetaminophen 5-325 MG per tablet   Commonly known as: PERCOCET   Take 1-2 tablets by mouth every 6 (six) hours as needed for pain.      vitamin C 500 MG tablet   Commonly known as: ASCORBIC ACID   Take 500 mg by mouth daily.            Hospital Course: Right lower extremity cellulitis with massive lymphedema  Initally was started on vancomycin and Zosyn which was transitioned to doxycycline and cefuroxime on 11/14/2011. Antibiotics  since 11/12/2011. Will define 2 week course of antibiotics. Doppler of right lower she been negative for DVT. Her lower extremity wound had improved mildly during the hospital stay. Instructed the patient to elevate her leg. Will have home health RN to monitor her wound.  Insulin dependent diabetes mellitus type II uncontrolled with complications  HbA1c 7.8. Continue sliding scale insulin and NovoLog 70/30 her home dose.   COPD  Stable   History of diastolic CHF  Continue Lasix, Clonidine, hydralazine. Patient appears compensated at this time.   Hypertension  Uncontrolled. Increased the dose of clonidine from twice a day to 3 times a day on 11/14/2011. Increase hydralazine to 100 mg po TID on 11/15/2011. Continue Clonidine, hydralazine, and furosemide. Initally carvedilol was not started on admission, which probably caused the elevated blood pressure, once carvedilol was restarted  blood pressure improved.   Acute renal failure on chronic kidney disease stage II  Suspect creatinine may be elevated from diuresis. Improved with gentle hydration.  Questionable bacteremia  One of 2 bottles growing coag negative staph likely contaminant. Discontinued vancomycin.   Morbid obesity  Diet and exercise as outpatient.   DVT Prophylaxis  Lovenox   Disposition  Arrange for home health PT/RN at discharge.  Due to patient's morbid obesity, patient reports that she has will going to her primary care physician's office, case manager has been working with patient and family in providing any assistance that she would need.  Patient has Medicaid pending, options are limited until Medicaid comes back.    Day of Discharge BP 168/90  Pulse 73  Temp 98.9 F (37.2 C) (Oral)  Resp 22  Ht 5' (1.524 m)  Wt 187.562 kg (413 lb 8 oz)  BMI 80.76 kg/m2  SpO2 99%  Results for orders placed during the hospital encounter of 11/12/11 (from the past 48 hour(s))  GLUCOSE, CAPILLARY     Status: Abnormal   Collection Time   11/13/11  4:24 PM      Component Value Range Comment   Glucose-Capillary 160 (*) 70 - 99 mg/dL   GLUCOSE, CAPILLARY     Status: Abnormal   Collection Time   11/13/11  9:12 PM      Component Value Range Comment   Glucose-Capillary 149 (*) 70 - 99 mg/dL   CBC     Status: Abnormal   Collection Time   11/14/11  5:00 AM      Component Value Range Comment   WBC 8.1  4.0 - 10.5 K/uL    RBC 3.28 (*) 3.87 - 5.11 MIL/uL    Hemoglobin 8.9 (*) 12.0 - 15.0 g/dL    HCT 16.1 (*) 09.6 - 46.0 %    MCV 89.0  78.0 - 100.0 fL    MCH 27.1  26.0 - 34.0 pg    MCHC 30.5  30.0 - 36.0 g/dL    RDW 04.5 (*) 40.9 - 15.5 %    Platelets 226  150 - 400 K/uL   BASIC METABOLIC PANEL     Status: Abnormal   Collection Time   11/14/11  5:00 AM      Component Value Range Comment   Sodium 143  135 - 145 mEq/L    Potassium 3.9  3.5 - 5.1 mEq/L    Chloride 97  96 - 112 mEq/L    CO2 37 (*) 19 - 32  mEq/L    Glucose, Bld 167 (*) 70 - 99 mg/dL    BUN 29 (*) 6 - 23  mg/dL    Creatinine, Ser 7.84 (*) 0.50 - 1.10 mg/dL    Calcium 9.3  8.4 - 69.6 mg/dL    GFR calc non Af Amer 43 (*) >90 mL/min    GFR calc Af Amer 50 (*) >90 mL/min   GLUCOSE, CAPILLARY     Status: Abnormal   Collection Time   11/14/11  6:18 AM      Component Value Range Comment   Glucose-Capillary 152 (*) 70 - 99 mg/dL   GLUCOSE, CAPILLARY     Status: Abnormal   Collection Time   11/14/11 11:21 AM      Component Value Range Comment   Glucose-Capillary 181 (*) 70 - 99 mg/dL   GLUCOSE, CAPILLARY     Status: Abnormal   Collection Time   11/14/11  4:26 PM      Component Value Range Comment   Glucose-Capillary 135 (*) 70 - 99 mg/dL   GLUCOSE, CAPILLARY     Status: Abnormal   Collection Time   11/14/11  9:43 PM      Component Value Range Comment   Glucose-Capillary 142 (*) 70 - 99 mg/dL   CBC     Status: Abnormal   Collection Time   11/15/11  5:35 AM      Component Value Range Comment   WBC 8.9  4.0 - 10.5 K/uL    RBC 3.30 (*) 3.87 - 5.11 MIL/uL    Hemoglobin 8.9 (*) 12.0 - 15.0 g/dL    HCT 29.5 (*) 28.4 - 46.0 %    MCV 87.0  78.0 - 100.0 fL    MCH 27.0  26.0 - 34.0 pg    MCHC 31.0  30.0 - 36.0 g/dL    RDW 13.2 (*) 44.0 - 15.5 %    Platelets 235  150 - 400 K/uL   BASIC METABOLIC PANEL     Status: Abnormal   Collection Time   11/15/11  5:35 AM      Component Value Range Comment   Sodium 141  135 - 145 mEq/L    Potassium 3.4 (*) 3.5 - 5.1 mEq/L    Chloride 96  96 - 112 mEq/L    CO2 35 (*) 19 - 32 mEq/L    Glucose, Bld 155 (*) 70 - 99 mg/dL    BUN 22  6 - 23 mg/dL    Creatinine, Ser 1.02  0.50 - 1.10 mg/dL    Calcium 9.7  8.4 - 72.5 mg/dL    GFR calc non Af Amer 56 (*) >90 mL/min    GFR calc Af Amer 65 (*) >90 mL/min   GLUCOSE, CAPILLARY     Status: Abnormal   Collection Time   11/15/11  5:51 AM      Component Value Range Comment   Glucose-Capillary 149 (*) 70 - 99 mg/dL   GLUCOSE, CAPILLARY     Status:  Abnormal   Collection Time   11/15/11 11:54 AM      Component Value Range Comment   Glucose-Capillary 138 (*) 70 - 99 mg/dL    Comment 1 Documented in Chart      Comment 2 Notify RN       Dg Chest 1 View  11/12/2011  *RADIOLOGY REPORT*  Clinical Data: Shortness of breath and right leg pain.  CHEST - 1 VIEW  Comparison: 09/24/2011  Findings: Stable appearance of cardiac pacemaker.  Shallow inspiration.  Cardiac enlargement with mild pulmonary vascular congestion.  No significant edema.  Atelectasis in the left  lung base.  No blunting of costophrenic angles.  No focal airspace consolidation.  No pneumothorax.  IMPRESSION: Cardiac enlargement with mild pulmonary vascular congestion. Atelectasis in the left base.  Original Report Authenticated By: Marlon Pel, M.D.   Disposition: Home with home health PT and RN.  Diet: Diabetic diet  Activity: Resume as tolerated   Follow-up Appts: Discharge Orders    Future Orders Please Complete By Expires   Diet Carb Modified      Increase activity slowly      Discharge instructions      Comments:   Followup with PCP in 1 week. Home health RN to monitor patient's cellulitis. Elevate legs while laying or sitting (above the heart if possible).      TESTS THAT NEED FOLLOW-UP None  Time spent on discharge, talking to the patient, and coordinating care: 35 mins.  Signed: Cristal Ford, MD 11/15/2011, 2:35 PM

## 2011-11-15 NOTE — Progress Notes (Signed)
Subjective: Doing better today.  No specific concerns. Still having pain at the area of cellulitis.  Objective: Vital signs in last 24 hours: Filed Vitals:   11/15/11 0656 11/15/11 0745 11/15/11 0817 11/15/11 0943  BP: 192/101  187/107   Pulse:    74  Temp:      TempSrc:      Resp:      Height:      Weight:  187.562 kg (413 lb 8 oz)    SpO2:    98%   Weight change:   Intake/Output Summary (Last 24 hours) at 11/15/11 1224 Last data filed at 11/15/11 1000  Gross per 24 hour  Intake    240 ml  Output   1550 ml  Net  -1310 ml    Physical Exam: General: Awake, Oriented, No acute distress. HEENT: EOMI. Neck: Supple CV: S1 and S2 Lungs: Clear to ascultation bilaterally Abdomen: Soft, Nontender, Nondistended, +bowel sounds. Ext: Good pulses.  Significant lymphedema, erythema noted in the right lower extremity measuring 5 x 5 cm.  Lab Results: Basic Metabolic Panel:  Lab 11/15/11 1610 11/14/11 0500 11/13/11 1320 11/12/11 0511 11/12/11 0332  NA 141 143 138 -- 138  K 3.4* 3.9 3.6 -- 3.5  CL 96 97 94* -- 92*  CO2 35* 37* 36* -- --  GLUCOSE 155* 167* 166* -- 147*  BUN 22 29* 31* -- 32*  CREATININE 1.08 1.35* 1.47* 1.36* 1.40*  CALCIUM 9.7 9.3 9.0 -- --  MG -- -- -- -- --  PHOS -- -- -- -- --   Liver Function Tests: No results found for this basename: AST:5,ALT:5,ALKPHOS:5,BILITOT:5,PROT:5,ALBUMIN:5 in the last 168 hours No results found for this basename: LIPASE:5,AMYLASE:5 in the last 168 hours No results found for this basename: AMMONIA:5 in the last 168 hours CBC:  Lab 11/15/11 0535 11/14/11 0500 11/13/11 1320 11/12/11 0332 11/12/11 0249  WBC 8.9 8.1 10.0 -- 22.0*  NEUTROABS -- -- -- -- 20.3*  HGB 8.9* 8.9* 8.7* 10.5* 9.2*  HCT 28.7* 29.2* 28.3* 31.0* 28.8*  MCV 87.0 89.0 87.3 -- 86.0  PLT 235 226 204 -- 216   Cardiac Enzymes: No results found for this basename: CKTOTAL:5,CKMB:5,CKMBINDEX:5,TROPONINI:5 in the last 168 hours BNP (last 3 results)  Basename  12/01/10 0827  PROBNP 106.2   CBG:  Lab 11/15/11 1154 11/15/11 0551 11/14/11 2143 11/14/11 1626 11/14/11 1121  GLUCAP 138* 149* 142* 135* 181*   No results found for this basename: HGBA1C:5 in the last 72 hours Other Labs: No components found with this basename: POCBNP:3  Lab 11/12/11 0511  DDIMER 1.56*   No results found for this basename: CHOL:2,HDL:2,LDLCALC:2,TRIG:2,CHOLHDL:2,LDLDIRECT:2 in the last 168 hours No results found for this basename: TSH,T4TOTAL,FREET3,T3FREE,FREET4,THYROIDAB in the last 168 hours No results found for this basename: VITAMINB12:2,FOLATE:2,FERRITIN:2,TIBC:2,IRON:2,RETICCTPCT:2 in the last 168 hours  Micro Results: Recent Results (from the past 240 hour(s))  CULTURE, BLOOD (ROUTINE X 2)     Status: Normal (Preliminary result)   Collection Time   11/12/11  2:45 AM      Component Value Range Status Comment   Specimen Description BLOOD RIGHT HAND   Final    Special Requests BOTTLES DRAWN AEROBIC AND ANAEROBIC 10CC   Final    Culture  Setup Time [id number]   Final    Culture     Final    Value:        BLOOD CULTURE RECEIVED NO GROWTH TO DATE CULTURE WILL BE HELD FOR 5 DAYS BEFORE ISSUING A FINAL NEGATIVE  REPORT   Report Status PENDING   Incomplete   CULTURE, BLOOD (ROUTINE X 2)     Status: Normal   Collection Time   11/12/11  3:15 AM      Component Value Range Status Comment   Specimen Description BLOOD LEFT ARM   Final    Special Requests     Final    Value: BOTTLES DRAWN AEROBIC AND ANAEROBIC 3CC AEROBIC 2CC ANAEROBIC   Culture  Setup Time [id number]   Final    Culture     Final    Value: STAPHYLOCOCCUS SPECIES (COAGULASE NEGATIVE)     Note: THE SIGNIFICANCE OF ISOLATING THIS ORGANISM FROM A SINGLE SET OF BLOOD CULTURES WHEN MULTIPLE SETS ARE DRAWN IS UNCERTAIN. PLEASE NOTIFY THE MICROBIOLOGY DEPARTMENT WITHIN ONE WEEK IF SPECIATION AND SENSITIVITIES ARE REQUIRED.     Note: Gram Stain Report Called to,Read Back By and Verified With: NEGO CROSSON  @1215  11/13/11 BY KRAWS   Report Status 11/14/2011 FINAL   Final   URINE CULTURE     Status: Normal   Collection Time   11/12/11  4:45 AM      Component Value Range Status Comment   Specimen Description URINE, CATHETERIZED   Final    Special Requests NONE   Final    Culture  Setup Time [id number]   Final    Colony Count NO GROWTH   Final    Culture NO GROWTH   Final    Report Status 11/13/2011 FINAL   Final   CULTURE, BLOOD (ROUTINE X 2)     Status: Normal (Preliminary result)   Collection Time   11/13/11  1:20 PM      Component Value Range Status Comment   Specimen Description BLOOD LEFT ARM   Final    Special Requests BOTTLES DRAWN AEROBIC AND ANAEROBIC 10CC   Final    Culture  Setup Time [id number]   Final    Culture     Final    Value:        BLOOD CULTURE RECEIVED NO GROWTH TO DATE CULTURE WILL BE HELD FOR 5 DAYS BEFORE ISSUING A FINAL NEGATIVE REPORT   Report Status PENDING   Incomplete   CULTURE, BLOOD (ROUTINE X 2)     Status: Normal (Preliminary result)   Collection Time   11/13/11  1:25 PM      Component Value Range Status Comment   Specimen Description BLOOD RIGHT HAND   Final    Special Requests BOTTLES DRAWN AEROBIC ONLY 10CC   Final    Culture  Setup Time [id number]   Final    Culture     Final    Value:        BLOOD CULTURE RECEIVED NO GROWTH TO DATE CULTURE WILL BE HELD FOR 5 DAYS BEFORE ISSUING A FINAL NEGATIVE REPORT   Report Status PENDING   Incomplete     Studies/Results: No results found.  Medications: I have reviewed the patient's current medications. Scheduled Meds:    . aspirin EC  81 mg Oral Daily  . cefUROXime  500 mg Oral BID WC  . cloNIDine  0.2 mg Oral TID  . doxycycline  100 mg Oral Q12H  . enoxaparin (LOVENOX) injection  90 mg Subcutaneous Q24H  . ferrous sulfate  325 mg Oral Daily  . furosemide  120 mg Oral Daily  . hydrALAZINE  100 mg Oral TID  . insulin aspart  0-5 Units Subcutaneous QHS  . insulin aspart  0-9  Units Subcutaneous  TID WC  . insulin aspart protamine-insulin aspart  20 Units Subcutaneous Q breakfast  . multivitamin with minerals  1 tablet Oral Daily  . nystatin cream  1 application Topical BID  . potassium chloride  40 mEq Oral Once  . sodium chloride  3 mL Intravenous Q12H  . vitamin C  500 mg Oral Daily  . DISCONTD: cloNIDine  0.2 mg Oral BID  . DISCONTD: hydrALAZINE  100 mg Oral BID  . DISCONTD: piperacillin-tazobactam (ZOSYN)  IV  3.375 g Intravenous Q8H  . DISCONTD: vancomycin  2,000 mg Intravenous Q24H   Continuous Infusions:    . sodium chloride 50 mL/hr at 11/14/11 2047   PRN Meds:.acetaminophen, albuterol, hydrALAZINE, HYDROmorphone (DILAUDID) injection, oxyCODONE-acetaminophen  Assessment/Plan: Right lower extremity cellulitis with massive lymphedema Transitioned vancomycin and Zosyn to doxycycline and cefuroxime on 11/14/2011. Antibiotics since 11/12/2011.  Doppler of right lower she been negative for DVT.    Insulin dependent diabetes mellitus type II uncontrolled with complications HbA1c 7.8. Continue sliding scale insulin and NovoLog 70/30 her home dose.  COPD Stable   History of diastolic CHF Continue Lasix, Clonidine, hydralazine.  Patient appears compensated at this time.  Hypertension Uncontrolled.  Increased the dose of clonidine from twice a day to 3 times a day on 11/14/2011.  Increase hydralazine to 100 mg po TID on 11/15/2011. Continue Coreg, hydralazine, and furosemide. Restart home carvedilol today.  Acute renal failure on chronic kidney disease stage II Suspect creatinine may be elevated from diuresis.   Questionable bacteremia One of 2 bottles growing coag negative staph likely contaminant.  Discontinued vancomycin.  Morbid obesity Diet and exercise as outpatient.  DVT Prophylaxis Lovenox   Code Status Full code.  Disposition Discharge the patient today, if blood pressure is improved. Arrange for home health PT/RN at discharge. Appreciate care  management's efforts assisting patient with appropriate PCP followup.   LOS: 3 days  Shakevia Sarris A, MD 11/15/2011, 12:24 PM

## 2011-11-15 NOTE — Plan of Care (Signed)
Problem: Phase II Progression Outcomes Goal: Progress activity as tolerated unless otherwise ordered Outcome: Completed/Met Date Met:  11/15/11 Bedfast  Problem: Discharge Progression Outcomes Goal: Home Health Care arrangements in place Outcome: Completed/Met Date Met:  11/15/11 [facility]

## 2011-11-18 LAB — CULTURE, BLOOD (ROUTINE X 2): Culture: NO GROWTH

## 2011-11-19 ENCOUNTER — Encounter: Payer: Self-pay | Admitting: *Deleted

## 2011-11-20 LAB — CULTURE, BLOOD (ROUTINE X 2)
Culture: NO GROWTH
Culture: NO GROWTH

## 2011-11-27 ENCOUNTER — Ambulatory Visit (INDEPENDENT_AMBULATORY_CARE_PROVIDER_SITE_OTHER): Payer: Medicaid Other | Admitting: *Deleted

## 2011-11-27 ENCOUNTER — Encounter: Payer: Self-pay | Admitting: Internal Medicine

## 2011-11-27 DIAGNOSIS — I442 Atrioventricular block, complete: Secondary | ICD-10-CM

## 2011-12-07 LAB — REMOTE PACEMAKER DEVICE
AL AMPLITUDE: 2.8 mv
AL IMPEDENCE PM: 677 Ohm
ATRIAL PACING PM: 9
BATTERY VOLTAGE: 2.77 V
RV LEAD IMPEDENCE PM: 1063 Ohm
VENTRICULAR PACING PM: 92

## 2012-01-03 ENCOUNTER — Encounter: Payer: Self-pay | Admitting: *Deleted

## 2012-01-31 ENCOUNTER — Inpatient Hospital Stay (HOSPITAL_COMMUNITY)
Admission: EM | Admit: 2012-01-31 | Discharge: 2012-02-04 | DRG: 603 | Disposition: A | Discharge status: Home Health | Payer: Medicaid Other | Attending: Pulmonary Disease | Admitting: Pulmonary Disease

## 2012-01-31 ENCOUNTER — Encounter (HOSPITAL_COMMUNITY): Payer: Self-pay | Admitting: *Deleted

## 2012-01-31 DIAGNOSIS — L02419 Cutaneous abscess of limb, unspecified: Principal | ICD-10-CM | POA: Diagnosis present

## 2012-01-31 DIAGNOSIS — Z9089 Acquired absence of other organs: Secondary | ICD-10-CM

## 2012-01-31 DIAGNOSIS — Z9981 Dependence on supplemental oxygen: Secondary | ICD-10-CM

## 2012-01-31 DIAGNOSIS — J449 Chronic obstructive pulmonary disease, unspecified: Secondary | ICD-10-CM | POA: Diagnosis present

## 2012-01-31 DIAGNOSIS — Z87891 Personal history of nicotine dependence: Secondary | ICD-10-CM

## 2012-01-31 DIAGNOSIS — G4730 Sleep apnea, unspecified: Secondary | ICD-10-CM | POA: Diagnosis present

## 2012-01-31 DIAGNOSIS — I509 Heart failure, unspecified: Secondary | ICD-10-CM | POA: Diagnosis present

## 2012-01-31 DIAGNOSIS — I252 Old myocardial infarction: Secondary | ICD-10-CM

## 2012-01-31 DIAGNOSIS — L03116 Cellulitis of left lower limb: Secondary | ICD-10-CM | POA: Diagnosis present

## 2012-01-31 DIAGNOSIS — J4489 Other specified chronic obstructive pulmonary disease: Secondary | ICD-10-CM | POA: Diagnosis present

## 2012-01-31 DIAGNOSIS — R601 Generalized edema: Secondary | ICD-10-CM | POA: Diagnosis present

## 2012-01-31 DIAGNOSIS — E1165 Type 2 diabetes mellitus with hyperglycemia: Secondary | ICD-10-CM | POA: Diagnosis present

## 2012-01-31 DIAGNOSIS — IMO0002 Reserved for concepts with insufficient information to code with codable children: Secondary | ICD-10-CM | POA: Diagnosis present

## 2012-01-31 DIAGNOSIS — M069 Rheumatoid arthritis, unspecified: Secondary | ICD-10-CM | POA: Diagnosis present

## 2012-01-31 DIAGNOSIS — L0390 Cellulitis, unspecified: Secondary | ICD-10-CM

## 2012-01-31 DIAGNOSIS — L304 Erythema intertrigo: Secondary | ICD-10-CM | POA: Diagnosis present

## 2012-01-31 DIAGNOSIS — E118 Type 2 diabetes mellitus with unspecified complications: Secondary | ICD-10-CM | POA: Diagnosis present

## 2012-01-31 DIAGNOSIS — Z950 Presence of cardiac pacemaker: Secondary | ICD-10-CM | POA: Diagnosis present

## 2012-01-31 DIAGNOSIS — I5032 Chronic diastolic (congestive) heart failure: Secondary | ICD-10-CM | POA: Diagnosis present

## 2012-01-31 DIAGNOSIS — T148XXA Other injury of unspecified body region, initial encounter: Secondary | ICD-10-CM

## 2012-01-31 DIAGNOSIS — Z8249 Family history of ischemic heart disease and other diseases of the circulatory system: Secondary | ICD-10-CM

## 2012-01-31 DIAGNOSIS — I2510 Atherosclerotic heart disease of native coronary artery without angina pectoris: Secondary | ICD-10-CM | POA: Diagnosis present

## 2012-01-31 DIAGNOSIS — Z6841 Body Mass Index (BMI) 40.0 and over, adult: Secondary | ICD-10-CM

## 2012-01-31 LAB — BASIC METABOLIC PANEL
CO2: 33 mEq/L — ABNORMAL HIGH (ref 19–32)
Calcium: 9.8 mg/dL (ref 8.4–10.5)
Glucose, Bld: 136 mg/dL — ABNORMAL HIGH (ref 70–99)
Potassium: 4 mEq/L (ref 3.5–5.1)
Sodium: 135 mEq/L (ref 135–145)

## 2012-01-31 LAB — CBC WITH DIFFERENTIAL/PLATELET
Eosinophils Relative: 6 % — ABNORMAL HIGH (ref 0–5)
Hemoglobin: 9.5 g/dL — ABNORMAL LOW (ref 12.0–15.0)
Lymphocytes Relative: 15 % (ref 12–46)
Lymphs Abs: 1.4 10*3/uL (ref 0.7–4.0)
MCV: 86.2 fL (ref 78.0–100.0)
Monocytes Relative: 7 % (ref 3–12)
Platelets: 254 10*3/uL (ref 150–400)
RBC: 3.48 MIL/uL — ABNORMAL LOW (ref 3.87–5.11)
WBC: 8.9 10*3/uL (ref 4.0–10.5)

## 2012-01-31 LAB — GLUCOSE, CAPILLARY

## 2012-01-31 MED ORDER — SODIUM CHLORIDE 0.9 % IJ SOLN
3.0000 mL | Freq: Two times a day (BID) | INTRAMUSCULAR | Status: DC
  Administered 2012-02-01 – 2012-02-03 (×4): 3 mL via INTRAVENOUS
  Filled 2012-01-31 (×2): qty 3

## 2012-01-31 MED ORDER — HYDROMORPHONE HCL PF 1 MG/ML IJ SOLN
1.0000 mg | Freq: Once | INTRAMUSCULAR | Status: AC
  Administered 2012-01-31: 1 mg via INTRAVENOUS
  Filled 2012-01-31: qty 1

## 2012-01-31 MED ORDER — HYDRALAZINE HCL 25 MG PO TABS
100.0000 mg | ORAL_TABLET | Freq: Four times a day (QID) | ORAL | Status: DC
  Administered 2012-02-01 – 2012-02-03 (×12): 100 mg via ORAL
  Filled 2012-01-31: qty 4
  Filled 2012-01-31 (×2): qty 2
  Filled 2012-01-31 (×3): qty 4
  Filled 2012-01-31: qty 3
  Filled 2012-01-31: qty 1
  Filled 2012-01-31 (×2): qty 4
  Filled 2012-01-31: qty 2
  Filled 2012-01-31 (×2): qty 4
  Filled 2012-01-31: qty 2
  Filled 2012-01-31 (×2): qty 1
  Filled 2012-01-31: qty 4
  Filled 2012-01-31: qty 2

## 2012-01-31 MED ORDER — SPIRONOLACTONE 25 MG PO TABS
25.0000 mg | ORAL_TABLET | Freq: Every day | ORAL | Status: DC
  Administered 2012-02-01 – 2012-02-03 (×3): 25 mg via ORAL
  Filled 2012-01-31 (×3): qty 1

## 2012-01-31 MED ORDER — VANCOMYCIN HCL 1000 MG IV SOLR
1500.0000 mg | Freq: Once | INTRAVENOUS | Status: AC
  Administered 2012-02-01: 1500 mg via INTRAVENOUS
  Filled 2012-01-31: qty 1500

## 2012-01-31 MED ORDER — SODIUM CHLORIDE 0.9 % IV SOLN
Freq: Once | INTRAVENOUS | Status: AC
  Administered 2012-01-31: 20:00:00 via INTRAVENOUS

## 2012-01-31 MED ORDER — INSULIN LISPRO PROT & LISPRO (75-25 MIX) 100 UNIT/ML ~~LOC~~ SUSP: 20.0000 [IU] | Freq: Three times a day (TID) | SUBCUTANEOUS | Status: DC

## 2012-01-31 MED ORDER — ONDANSETRON HCL 4 MG/2ML IJ SOLN: 4.0000 mg | Freq: Three times a day (TID) | INTRAMUSCULAR | Status: DC | PRN

## 2012-01-31 MED ORDER — SODIUM CHLORIDE 0.9 % IJ SOLN
3.0000 mL | INTRAMUSCULAR | Status: DC | PRN
  Filled 2012-01-31: qty 3

## 2012-01-31 MED ORDER — INSULIN ASPART PROT & ASPART (70-30 MIX) 100 UNIT/ML ~~LOC~~ SUSP
20.0000 [IU] | Freq: Three times a day (TID) | SUBCUTANEOUS | Status: DC
  Administered 2012-02-01 – 2012-02-04 (×10): 20 [IU] via SUBCUTANEOUS
  Filled 2012-01-31: qty 3

## 2012-01-31 MED ORDER — VANCOMYCIN HCL IN DEXTROSE 1-5 GM/200ML-% IV SOLN
1000.0000 mg | Freq: Once | INTRAVENOUS | Status: AC
  Administered 2012-01-31: 1000 mg via INTRAVENOUS
  Filled 2012-01-31: qty 200

## 2012-01-31 MED ORDER — TORSEMIDE 20 MG PO TABS
50.0000 mg | ORAL_TABLET | Freq: Two times a day (BID) | ORAL | Status: DC
  Administered 2012-02-01: 50 mg via ORAL
  Filled 2012-01-31 (×2): qty 3

## 2012-01-31 MED ORDER — NYSTATIN 100000 UNIT/GM EX CREA
TOPICAL_CREAM | Freq: Two times a day (BID) | CUTANEOUS | Status: DC
  Administered 2012-02-01 – 2012-02-02 (×5): via TOPICAL
  Administered 2012-02-03: 1 via TOPICAL
  Administered 2012-02-03: 10:00:00 via TOPICAL
  Filled 2012-01-31 (×2): qty 15

## 2012-01-31 MED ORDER — CARVEDILOL 12.5 MG PO TABS
25.0000 mg | ORAL_TABLET | Freq: Two times a day (BID) | ORAL | Status: DC
  Administered 2012-02-01 – 2012-02-04 (×7): 25 mg via ORAL
  Filled 2012-01-31 (×7): qty 2

## 2012-01-31 MED ORDER — LABETALOL HCL 200 MG PO TABS
300.0000 mg | ORAL_TABLET | Freq: Two times a day (BID) | ORAL | Status: DC
  Administered 2012-02-01 – 2012-02-03 (×7): 300 mg via ORAL
  Filled 2012-01-31 (×7): qty 2

## 2012-01-31 MED ORDER — VITAMIN C 500 MG PO TABS
500.0000 mg | ORAL_TABLET | Freq: Every day | ORAL | Status: DC
  Administered 2012-02-01 – 2012-02-03 (×3): 500 mg via ORAL
  Filled 2012-01-31 (×3): qty 1

## 2012-01-31 MED ORDER — SODIUM CHLORIDE 0.9 % IV SOLN: 250.0000 mL | INTRAVENOUS | Status: DC | PRN

## 2012-01-31 MED ORDER — ASPIRIN EC 81 MG PO TBEC
81.0000 mg | DELAYED_RELEASE_TABLET | Freq: Every day | ORAL | Status: DC
  Administered 2012-02-01 – 2012-02-03 (×3): 81 mg via ORAL
  Filled 2012-01-31 (×3): qty 1

## 2012-01-31 MED ORDER — CLONIDINE HCL 0.2 MG PO TABS
0.2000 mg | ORAL_TABLET | Freq: Two times a day (BID) | ORAL | Status: DC
  Administered 2012-02-01 – 2012-02-03 (×7): 0.2 mg via ORAL
  Filled 2012-01-31 (×7): qty 1

## 2012-01-31 MED ORDER — ALLOPURINOL 300 MG PO TABS
300.0000 mg | ORAL_TABLET | Freq: Every day | ORAL | Status: DC
  Administered 2012-02-01 – 2012-02-03 (×3): 300 mg via ORAL
  Filled 2012-01-31 (×3): qty 1

## 2012-01-31 MED ORDER — FERROUS SULFATE 325 (65 FE) MG PO TABS
325.0000 mg | ORAL_TABLET | Freq: Every day | ORAL | Status: DC
  Administered 2012-02-01 – 2012-02-03 (×3): 325 mg via ORAL
  Filled 2012-01-31 (×3): qty 1

## 2012-01-31 NOTE — ED Notes (Signed)
Began having pain and tenderness in gluteal folds last Sunday.  Began noticing drainage and "blisters" today.

## 2012-01-31 NOTE — Consult Note (Signed)
ANTIBIOTIC CONSULT NOTE-Preliminary  Pharmacy Consult for Vancomycin Indication: Cellulitis  No Known Allergies  Patient Measurements: Height: 5' (152.4 cm) Weight: 410 lb (185.975 kg) IBW/kg (Calculated) : 45.5    Vital Signs: Temp: 97.7 F (36.5 C) (09/12 2224) Temp src: Oral (09/12 2224) BP: 144/71 mmHg (09/12 2048) Pulse Rate: 77  (09/12 2048)  Labs:  [facility] 01/31/12 1921  WBC 8.9  HGB 9.5*  PLT 254  LABCREA --  CREATININE 1.15*    Estimated Creatinine Clearance: 87.7 ml/min (by C-G formula based on Cr of 1.15).  No results found for this basename: VANCOTROUGH:2,VANCOPEAK:2,VANCORANDOM:2,GENTTROUGH:2,GENTPEAK:2,GENTRANDOM:2,TOBRATROUGH:2,TOBRAPEAK:2,TOBRARND:2,AMIKACINPEAK:2,AMIKACINTROU:2,AMIKACIN:2, in the last 72 hours   Microbiology: No results found for this or any previous visit (from the past 720 hour(s)).  Medical History: Past Medical History  Diagnosis Date  . Morbid obesity   . Hypertension   . CHF (congestive heart failure)     Diastolic;   . Pacemaker   . COPD (chronic obstructive pulmonary disease)   . Oxygen dependent   . Pickwickian syndrome   . Coronary artery disease   . Shortness of breath   . Obesity hypoventilation syndrome   . Arthritis   . Hiatal hernia   . Tachy-brady syndrome     s/p pacemaker 2008  . Cellulitis     Recurrent bilateral LE  . Respiratory failure, acute 12/2009    Sec to diastolic CHF and COPD  . Constipation 04/02/2011  . High cholesterol   . Peripheral vascular disease   . Anginal pain   . Myocardial infarction 1990's  . Chronic bronchitis   . Sleep apnea   . Type II diabetes mellitus   . Gout     Medications:  Vancomycin 1 Gm IV given in the ED   Assessment: This patient is a 56 yo female, bed bound due to morbid obesity with left lower extremity cellulitis   Goal of Therapy:  Vancomycin troughs 15-20 mg/dl    Plan:  Preliminary review of pertinent patient information completed.   Protocol will be initiated with a one-time dose(s) of 1500 mg IV vancomycin in addition to the 1 Gm dose given in the ED for a total dose of 2500 mg. The [facility] clinical pharmacist will complete review on morning rounds to assess patient and finalize treatment regimen.  Arelia Sneddon, [facility] 01/31/2012,11:43 PM

## 2012-01-31 NOTE — Progress Notes (Signed)
Patient refused to wear one of our CPAP machines;she prefers to use her's from home;patient on 2 lpm  and will be  monitored through the night.

## 2012-01-31 NOTE — ED Provider Notes (Signed)
History  This chart was scribed for Derwood Kaplan, MD by Ladona Ridgel Day. This patient was seen in room APA10/APA10 and the patient's care was started at 1807.   CSN: [id number]  Arrival date & time 01/31/12  1807   First MD Initiated Contact with Patient 01/31/12 1808      Chief Complaint  Patient presents with  . Abscess  . Cellulitis   The history is provided by the patient. No language interpreter was used.   [name] is a 56 y.o. female who presents to the Emergency Department complaining of constant gradually worsening painful/erythematous cellulitis in between her gluteal folds and LLE since this AM. She states purulant drainage from areas over her bilateral gluteus with some skin breakdown. She states in June had similar episode when she was hospitalized. She takes insulin for her DM. She denies any history of boils.   Past Medical History  Diagnosis Date  . Morbid obesity   . Hypertension   . CHF (congestive heart failure)     Diastolic;   . Pacemaker   . COPD (chronic obstructive pulmonary disease)   . Oxygen dependent   . Pickwickian syndrome   . Coronary artery disease   . Shortness of breath   . Obesity hypoventilation syndrome   . Arthritis   . Hiatal hernia   . Tachy-brady syndrome     s/p pacemaker 2008  . Cellulitis     Recurrent bilateral LE  . Respiratory failure, acute 12/2009    Sec to diastolic CHF and COPD  . Constipation 04/02/2011  . High cholesterol   . Peripheral vascular disease   . Anginal pain   . Myocardial infarction 1990's  . Chronic bronchitis   . Sleep apnea   . Type II diabetes mellitus   . Gout     Past Surgical History  Procedure Date  . Abdominal hysterectomy 1980's  . Insert / replace / remove pacemaker !~2010    Family History  Problem Relation Age of Onset  . Coronary artery disease Mother     MI age 60, died.  . Hypertension Father   . Osteoarthritis Father   . Hypertension Sister   . Hypertension Brother     . Obesity Father     History  Substance Use Topics  . Smoking status: Former Smoker -- 0.5 packs/day for .1 years    Types: Cigarettes    Quit date: 05/22/1967  . Smokeless tobacco: Never Used  . Alcohol Use: No    OB History    Grav Para Term Preterm Abortions TAB SAB Ect Mult Living   5 5 5       5       Review of Systems  Constitutional: Negative for fever and chills.  HENT: Negative for congestion and rhinorrhea.   Respiratory: Negative for shortness of breath.   Cardiovascular: Negative for chest pain.  Gastrointestinal: Negative for nausea, vomiting, abdominal pain and diarrhea.  Skin: Positive for rash (pain and tenderness over gluteal folds and lle).  Neurological: Negative for weakness.  All other systems reviewed and are negative.    Allergies  Review of patient's allergies indicates no known allergies.  Home Medications   Current Outpatient Rx  Name Route Sig Dispense Refill  . ALBUTEROL SULFATE (2.5 MG/3ML) 0.083% IN NEBU Nebulization Take 2.5 mg by nebulization every 6 (six) hours as needed. For shortness of breath    . ASPIRIN 81 MG PO TBEC Oral Take 1 tablet (81 mg total) by  mouth daily.    Marland Kitchen CARVEDILOL 25 MG PO TABS Oral Take 1 tablet (25 mg total) by mouth 2 (two) times daily with a meal. 180 tablet 3  . CLONIDINE HCL 0.2 MG PO TABS Oral Take 1 tablet (0.2 mg total) by mouth 3 (three) times daily. 90 tablet 0  . FERROUS SULFATE 325 (65 FE) MG PO TABS Oral Take 325 mg by mouth daily.      . FUROSEMIDE 40 MG PO TABS Oral Take 120 mg by mouth daily. THE DOSE WAS DECREASED TO 1/2 TABLET 2 TIMES DAILY.    Marland Kitchen HYDRALAZINE HCL 100 MG PO TABS Oral Take 1 tablet (100 mg total) by mouth 3 (three) times daily. 90 tablet 0  . INSULIN LISPRO PROT & LISPRO (75-25) 100 UNIT/ML [place] SUSP Subcutaneous Inject 20 Units into the skin 3 (three) times daily.     Carma Leaven M PLUS PO TABS Oral Take 1 tablet by mouth daily.      . ALEVE PO Oral Take 2 tablets by mouth daily as  needed. For pain    . TRIPLE ANTIBIOTIC [phone] EX OINT  APPLY TO YOUR LEGS ON EVERY Tuesday, Thursday, AND Saturday. 28.3 g 0  . NYSTATIN 100000 UNIT/GM EX CREA  APPLY TO YOUR SKIN FOLDS AND LEGS EVERY Monday, Wednesday , AND Friday. 30 g 1  . VITAMIN C 500 MG PO TABS Oral Take 500 mg by mouth daily.      Triage Vitals: BP 152/81  Pulse 79  Temp 98.7 F (37.1 C) (Oral)  Ht 5' (1.524 m)  Wt 410 lb (185.975 kg)  BMI 80.07 kg/m2  SpO2 98%  Physical Exam  Nursing note and vitals reviewed. Constitutional: She is oriented to person, place, and time. She appears well-developed and well-nourished. No distress.  HENT:  Head: Normocephalic and atraumatic.  Eyes: EOM are normal.  Neck: Neck supple. No tracheal deviation present.  Cardiovascular: Normal rate, regular rhythm and normal heart sounds.        Regular rate and rhythm s1 & s2  Pulmonary/Chest: Effort normal. No respiratory distress.  Abdominal: Soft. Bowel sounds are normal. She exhibits no distension. There is no tenderness. There is no rebound and no guarding.  Musculoskeletal: Normal range of motion.  Neurological: She is alert and oriented to person, place, and time.  Skin: Skin is warm and dry. There is erythema.       Area over her right glute with skin excoriation but no specific area of induration. Left gluteus with induration over medial aspect, no fluctuance, but positive erythema.  RLE popliteal large area with erythema and warm to touch     Psychiatric: She has a normal mood and affect. Her behavior is normal.    ED Course  Procedures (including critical care time) DIAGNOSTIC STUDIES: Oxygen Saturation is 98% on room air, normal by my interpretation.    COORDINATION OF CARE: At 700 PM Discussed treatment plan with patient which includes IV fluids, blood work, and vancomycin. Patient agrees.   Labs Reviewed - No data to display No results found.   No diagnosis found.    MDM  Medical screening  examination/treatment/procedure(s) were performed by me as the supervising physician. Scribe service was utilized for documentation only.  Pt with hx of iddm, cellulitis in June comes in with cc of cellulitis.  LLE - in the popliteal fossa - there is a large area  -about 15% of the area below the knee that is erythematous and tender.  Bilateral glut exam reveals massive excoriation of the right glut, and posterio-medial aspect of the left glut having some induration and erythema - with again some skin breakdown.  Concerns for cellulitis, may be early phlegom on the gluteus. She is diabetic, morbidly obese, and it would be best to admit her. No systemic signs at this time.         Derwood Kaplan, MD 01/31/12 1929

## 2012-01-31 NOTE — Consult Note (Deleted)
ANTIBIOTIC CONSULT NOTE-Preliminary  Pharmacy Consult for Vancomycin Indication: Cellulitis  No Known Allergies  Patient Measurements: Height: 5' (152.4 cm) Weight: 410 lb (185.975 kg) IBW/kg (Calculated) : 45.5    Vital Signs: Temp: 97.7 F (36.5 C) (09/12 2224) Temp src: Oral (09/12 2224) BP: 144/71 mmHg (09/12 2048) Pulse Rate: 77  (09/12 2048)  Labs:  [facility] 01/31/12 1921  WBC 8.9  HGB 9.5*  PLT 254  LABCREA --  CREATININE 1.15*    Estimated Creatinine Clearance: 87.7 ml/min (by C-G formula based on Cr of 1.15).  No results found for this basename: VANCOTROUGH:2,VANCOPEAK:2,VANCORANDOM:2,GENTTROUGH:2,GENTPEAK:2,GENTRANDOM:2,TOBRATROUGH:2,TOBRAPEAK:2,TOBRARND:2,AMIKACINPEAK:2,AMIKACINTROU:2,AMIKACIN:2, in the last 72 hours   Microbiology: No results found for this or any previous visit (from the past 720 hour(s)).  Medical History: Past Medical History  Diagnosis Date  . Morbid obesity   . Hypertension   . CHF (congestive heart failure)     Diastolic;   . Pacemaker   . COPD (chronic obstructive pulmonary disease)   . Oxygen dependent   . Pickwickian syndrome   . Coronary artery disease   . Shortness of breath   . Obesity hypoventilation syndrome   . Arthritis   . Hiatal hernia   . Tachy-brady syndrome     s/p pacemaker 2008  . Cellulitis     Recurrent bilateral LE  . Respiratory failure, acute 12/2009    Sec to diastolic CHF and COPD  . Constipation 04/02/2011  . High cholesterol   . Peripheral vascular disease   . Anginal pain   . Myocardial infarction 1990's  . Chronic bronchitis   . Sleep apnea   . Type II diabetes mellitus   . Gout     Medications:  Vancomycin 1 Gm IV in the ED   Assessment: This patient is a 56 yo bed bound morbidly obese female with left lower extremity cellulitis   Goal of Therapy:  Vancomycin troughs 10-15 mg/dl  Plan:  Preliminary review of pertinent patient information completed.  Protocol will be  initiated with a one-time dose of 1500 mg vancomycin IV in addition to a 1 Gm dose given in the ED, for a total dose of 2500 mg..  Jeani Hawking clinical pharmacist will complete review during morning rounds to assess patient and finalize treatment regimen.  Arelia Sneddon, [facility] 01/31/2012,11:48 PM

## 2012-01-31 NOTE — H&P (Signed)
PCP:   Evlyn Courier, MD   Chief Complaint:  Rash to left inner thigh  HPI: 56 yo female who is chronically bed bound due to morbid obesity who comes in with one day of worsening redness and warmth to left inner thigh.  She fights skin infections often.  Has chronic issues with yeast infections.  Is taken care of at home by her dtr and husband.  They do use a barrier cream for her skin on her buttocks daily but ran out of it about 2 days ago and since then she is starting to have skin breakdown.  She denies any fevers, n/v/d.  No drainage from anywhere.    Review of Systems:  O/w neg  Past Medical History: Past Medical History  Diagnosis Date  . Morbid obesity   . Hypertension   . CHF (congestive heart failure)     Diastolic;   . Pacemaker   . COPD (chronic obstructive pulmonary disease)   . Oxygen dependent   . Pickwickian syndrome   . Coronary artery disease   . Shortness of breath   . Obesity hypoventilation syndrome   . Arthritis   . Hiatal hernia   . Tachy-brady syndrome     s/p pacemaker 2008  . Cellulitis     Recurrent bilateral LE  . Respiratory failure, acute 12/2009    Sec to diastolic CHF and COPD  . Constipation 04/02/2011  . High cholesterol   . Peripheral vascular disease   . Anginal pain   . Myocardial infarction 1990's  . Chronic bronchitis   . Sleep apnea   . Type II diabetes mellitus   . Gout    Past Surgical History  Procedure Date  . Abdominal hysterectomy 1980's  . Insert / replace / remove pacemaker !~2010    Medications: Prior to Admission medications   Medication Sig Start Date End Date Taking? Authorizing Provider  albuterol (PROVENTIL) (2.5 MG/3ML) 0.083% nebulizer solution Take 2.5 mg by nebulization every 6 (six) hours as needed. For shortness of breath   Yes Historical Provider, MD  allopurinol (ZYLOPRIM) 300 MG tablet Take 300 mg by mouth daily.   Yes Historical Provider, MD  aspirin EC 81 MG EC tablet Take 1 tablet (81 mg total)  by mouth daily. 03/02/11 03/01/12 Yes Elliot Cousin, MD  carvedilol (COREG) 25 MG tablet Take 1 tablet (25 mg total) by mouth 2 (two) times daily with a meal. 04/13/11 04/12/12 Yes Kathlen Brunswick, MD  cloNIDine (CATAPRES) 0.2 MG tablet Take 0.2 mg by mouth 2 (two) times daily. 11/15/11 11/14/12 Yes Srikar Cherlynn Kaiser, MD  ferrous sulfate 325 (65 FE) MG tablet Take 325 mg by mouth daily.     Yes Historical Provider, MD  hydrALAZINE (APRESOLINE) 100 MG tablet Take 100 mg by mouth 4 (four) times daily. 11/15/11 11/14/12 Yes Srikar Cherlynn Kaiser, MD  insulin lispro protamine-insulin lispro (HUMALOG 75/25) (75-25) 100 UNIT/ML SUSP Inject 20 Units into the skin 3 (three) times daily.  04/02/11  Yes Elliot Cousin, MD  labetalol (NORMODYNE) 300 MG tablet Take 300 mg by mouth 2 (two) times daily.   Yes Historical Provider, MD  Multiple Vitamins-Minerals (MULTIVITAMINS THER. W/MINERALS) TABS Take 1 tablet by mouth daily.     Yes Historical Provider, MD  neomycin-bacitracin-polymyxin (NEOSPORIN) [phone] ointment APPLY TO YOUR LEGS ON EVERY Tuesday, Thursday, AND Saturday. 04/02/11  Yes Elliot Cousin, MD  nystatin (MYCOSTATIN) cream APPLY TO YOUR SKIN FOLDS AND LEGS EVERY Monday, Wednesday , AND Friday. 04/02/11  Yes Elliot Cousin, MD  spironolactone (ALDACTONE) 25 MG tablet Take 25 mg by mouth daily.   Yes Historical Provider, MD  torsemide (DEMADEX) 100 MG tablet Take 50 mg by mouth 2 (two) times daily.   Yes Historical Provider, MD  vitamin C (ASCORBIC ACID) 500 MG tablet Take 500 mg by mouth daily.   Yes Historical Provider, MD    Allergies:  No Known Allergies  Social History:  reports that she quit smoking about 44 years ago. Her smoking use included Cigarettes. She has a .05 pack-year smoking history. She has never used smokeless tobacco. She reports that she does not drink alcohol or use illicit drugs.  Family History: Family History  Problem Relation Age of Onset  . Coronary artery disease Mother      MI age 10, died.  . Hypertension Father   . Osteoarthritis Father   . Hypertension Sister   . Hypertension Brother   . Obesity Father     Physical Exam: Filed Vitals:   01/31/12 1758 01/31/12 1802 01/31/12 1803  BP:  152/81   Pulse: 80 79   Temp: 98.7 F (37.1 C) 98.7 F (37.1 C)   TempSrc: Oral    Height:   5' (1.524 m)  Weight:   185.975 kg (410 lb)  SpO2: 98% 98%    General appearance: alert, cooperative and no distress Lungs: clear to auscultation bilaterally Heart: regular rate and rhythm, S1, S2 normal, no murmur, click, rub or gallop Abdomen: soft, non-tender; bowel sounds normal; no masses,  no organomegaly Extremities: edema 1+ble Pulses: 2+ and symmetric Skin: mild cellulitis to left upper inner thigh no absesses.  sacral area with beginnings of skin breakdown Neurologic: Grossly normal    Labs on Admission:   [facility] 01/31/12 1921  NA 135  K 4.0  CL 93*  CO2 33*  GLUCOSE 136*  BUN 35*  CREATININE 1.15*  CALCIUM 9.8  MG --  PHOS --    Basename 01/31/12 1921  WBC 8.9  NEUTROABS 6.3  HGB 9.5*  HCT 30.0*  MCV 86.2  PLT 254   Radiological Exams on Admission: No results found.  Assessment/Plan Present on Admission:  56 yo female with lle cellulitis .Cellulitis of left leg .COPD (chronic obstructive pulmonary disease) .DM (diabetes mellitus), type 2, uncontrolled with complications .PACEMAKER, PERMANENT .Rheumatoid arthritis .OBESITY, MORBID .Chronic diastolic heart failure  Vancomycin for cellulitis.  obs overnight.  Probably can transition to po abx tom.  Wound care consult for sacral area.   Harden Bramer A [phone] 01/31/2012, 8:44 PM

## 2012-02-01 DIAGNOSIS — L02419 Cutaneous abscess of limb, unspecified: Principal | ICD-10-CM

## 2012-02-01 DIAGNOSIS — R601 Generalized edema: Secondary | ICD-10-CM | POA: Diagnosis present

## 2012-02-01 DIAGNOSIS — R609 Edema, unspecified: Secondary | ICD-10-CM

## 2012-02-01 LAB — GLUCOSE, CAPILLARY

## 2012-02-01 LAB — MRSA PCR SCREENING: MRSA by PCR: POSITIVE — AB

## 2012-02-01 LAB — BASIC METABOLIC PANEL
Calcium: 9.5 mg/dL (ref 8.4–10.5)
Creatinine, Ser: 1.13 mg/dL — ABNORMAL HIGH (ref 0.50–1.10)
GFR calc non Af Amer: 53 mL/min — ABNORMAL LOW (ref >90)
Glucose, Bld: 205 mg/dL — ABNORMAL HIGH (ref 70–99)
Sodium: 139 mEq/L (ref 135–145)

## 2012-02-01 LAB — CBC
MCH: 27.6 pg (ref 26.0–34.0)
Platelets: 225 10*3/uL (ref 150–400)
RBC: 3.37 MIL/uL — ABNORMAL LOW (ref 3.87–5.11)
RDW: 16.8 % — ABNORMAL HIGH (ref 11.5–15.5)
WBC: 8.8 10*3/uL (ref 4.0–10.5)

## 2012-02-01 MED ORDER — TORSEMIDE 20 MG PO TABS
100.0000 mg | ORAL_TABLET | Freq: Two times a day (BID) | ORAL | Status: DC
  Administered 2012-02-01 – 2012-02-03 (×3): 100 mg via ORAL
  Filled 2012-02-01: qty 4
  Filled 2012-02-01: qty 2
  Filled 2012-02-01 (×3): qty 5

## 2012-02-01 MED ORDER — MUPIROCIN 2 % EX OINT
1.0000 "application " | TOPICAL_OINTMENT | Freq: Two times a day (BID) | CUTANEOUS | Status: DC
  Administered 2012-02-01 – 2012-02-03 (×6): 1 via NASAL
  Filled 2012-02-01: qty 22

## 2012-02-01 MED ORDER — HYDROCODONE-ACETAMINOPHEN 5-325 MG PO TABS
1.0000 | ORAL_TABLET | ORAL | Status: DC | PRN
  Administered 2012-02-01 – 2012-02-04 (×7): 2 via ORAL
  Filled 2012-02-01 (×7): qty 2

## 2012-02-01 MED ORDER — ACETAMINOPHEN 325 MG PO TABS
ORAL_TABLET | ORAL | Status: AC
  Administered 2012-02-01: 650 mg via ORAL
  Filled 2012-02-01: qty 2

## 2012-02-01 MED ORDER — VANCOMYCIN HCL IN DEXTROSE 1-5 GM/200ML-% IV SOLN
1000.0000 mg | Freq: Three times a day (TID) | INTRAVENOUS | Status: DC
  Filled 2012-02-01: qty 200

## 2012-02-01 MED ORDER — DOXYCYCLINE HYCLATE 100 MG PO TABS
100.0000 mg | ORAL_TABLET | Freq: Two times a day (BID) | ORAL | Status: DC
  Administered 2012-02-01 (×2): 100 mg via ORAL
  Filled 2012-02-01 (×2): qty 1

## 2012-02-01 MED ORDER — CHLORHEXIDINE GLUCONATE CLOTH 2 % EX PADS
6.0000 | MEDICATED_PAD | Freq: Every day | CUTANEOUS | Status: DC
  Administered 2012-02-01 – 2012-02-04 (×4): 6 via TOPICAL

## 2012-02-01 MED ORDER — NYSTATIN 100000 UNIT/GM EX CREA
TOPICAL_CREAM | CUTANEOUS | Status: AC
  Filled 2012-02-01: qty 15

## 2012-02-01 MED ORDER — HEPARIN SODIUM (PORCINE) 5000 UNIT/ML IJ SOLN
5000.0000 [IU] | Freq: Three times a day (TID) | INTRAMUSCULAR | Status: DC
  Administered 2012-02-01 – 2012-02-04 (×10): 5000 [IU] via SUBCUTANEOUS
  Filled 2012-02-01 (×10): qty 1

## 2012-02-01 MED ORDER — AMOXICILLIN-POT CLAVULANATE 875-125 MG PO TABS
1.0000 | ORAL_TABLET | Freq: Two times a day (BID) | ORAL | Status: DC
  Administered 2012-02-01 (×2): 1 via ORAL
  Filled 2012-02-01 (×9): qty 1

## 2012-02-01 MED ORDER — ACETAMINOPHEN 325 MG PO TABS
650.0000 mg | ORAL_TABLET | Freq: Four times a day (QID) | ORAL | Status: DC | PRN
  Administered 2012-02-01: 650 mg via ORAL

## 2012-02-01 NOTE — Consult Note (Signed)
ANTIBIOTIC CONSULT NOTE - INITIAL  Pharmacy Consult for Vancomycin Indication: cellulitis  No Known Allergies  Patient Measurements: Height: 5' (152.4 cm) Weight: 410 lb (185.975 kg) IBW/kg (Calculated) : 45.5   Vital Signs: Temp: 97.5 F (36.4 C) (09/13 0429) Temp src: Oral (09/13 0429) BP: 139/78 mmHg (09/13 0100) Pulse Rate: 75  (09/13 0100) Intake/Output from previous day: 09/12 0701 - 09/13 0700 In: 240 [P.O.:240] Out: -  Intake/Output from this shift:    Labs:  [facility] 02/01/12 0450 01/31/12 1921  WBC 8.8 8.9  HGB 9.3* 9.5*  PLT 225 254  LABCREA -- --  CREATININE 1.13* 1.15*   Estimated Creatinine Clearance: 89.3 ml/min (by C-G formula based on Cr of 1.13). No results found for this basename: VANCOTROUGH:2,VANCOPEAK:2,VANCORANDOM:2,GENTTROUGH:2,GENTPEAK:2,GENTRANDOM:2,TOBRATROUGH:2,TOBRAPEAK:2,TOBRARND:2,AMIKACINPEAK:2,AMIKACINTROU:2,AMIKACIN:2, in the last 72 hours   Microbiology: Recent Results (from the past 720 hour(s))  MRSA PCR SCREENING     Status: Abnormal   Collection Time   01/31/12 11:35 PM      Component Value Range Status Comment   MRSA by PCR POSITIVE (*) NEGATIVE Final    Medical History: Past Medical History  Diagnosis Date  . Morbid obesity   . Hypertension   . CHF (congestive heart failure)     Diastolic;   . Pacemaker   . COPD (chronic obstructive pulmonary disease)   . Oxygen dependent   . Pickwickian syndrome   . Coronary artery disease   . Shortness of breath   . Obesity hypoventilation syndrome   . Arthritis   . Hiatal hernia   . Tachy-brady syndrome     s/p pacemaker 2008  . Cellulitis     Recurrent bilateral LE  . Respiratory failure, acute 12/2009    Sec to diastolic CHF and COPD  . Constipation 04/02/2011  . High cholesterol   . Peripheral vascular disease   . Anginal pain   . Myocardial infarction 1990's  . Chronic bronchitis   . Sleep apnea   . Type II diabetes mellitus   . Gout    Medications:  Scheduled:     . sodium chloride   Intravenous Once  . allopurinol  300 mg Oral Daily  . aspirin EC  81 mg Oral Daily  . carvedilol  25 mg Oral BID WC  . Chlorhexidine Gluconate Cloth  6 each Topical Q0600  . cloNIDine  0.2 mg Oral BID  . ferrous sulfate  325 mg Oral Daily  . hydrALAZINE  100 mg Oral QID  .  HYDROmorphone (DILAUDID) injection  1 mg Intravenous Once  . insulin aspart protamine-insulin aspart  20 Units Subcutaneous TID WC  . insulin lispro protamine-insulin lispro  20 Units Subcutaneous TID  . labetalol  300 mg Oral BID  . mupirocin ointment  1 application Nasal BID  . nystatin cream   Topical BID  . sodium chloride  3 mL Intravenous Q12H  . spironolactone  25 mg Oral Daily  . torsemide  50 mg Oral BID  . vancomycin  1,500 mg Intravenous Once  . vancomycin  1,000 mg Intravenous Once  . vancomycin  1,000 mg Intravenous Q8H  . vitamin C  500 mg Oral Daily   Assessment: 56yo morbidly obese female with LE cellulitis.  Good renal fxn. Estimated Creatinine Clearance: 89.3 ml/min (by C-G formula based on Cr of 1.13).  Goal of Therapy:  Vancomycin trough level 10-15 mcg/ml Eradicate infection.  Plan: Vancomycin 1gm iv q8hrs Check trough at steady state Monitor labs, renal fxn, and cultures per protocol  Margo Aye,  Evolett Somarriba A 02/01/2012,7:57 AM

## 2012-02-01 NOTE — Progress Notes (Signed)
UR Chart Review Completed  

## 2012-02-01 NOTE — Progress Notes (Signed)
Pt refused to wear C-PAP.

## 2012-02-01 NOTE — Progress Notes (Addendum)
Chart reviewed.  Subjective: Reports that Dr. Juanetta Gosling is now her primary care provider. She has Medicaid. She reports that she had subjective fevers and chills at home. She also feels that her swelling is worse than usual. She reports that Dr. Juanetta Gosling increased her diuretics recently. Complaining of leg pain and requesting pain medication.  Objective: Vital signs in last 24 hours: Filed Vitals:   02/01/12 0026 02/01/12 0100 02/01/12 0429 02/01/12 0901  BP:  139/78    Pulse:  75    Temp: 97.8 F (36.6 C)  97.5 F (36.4 C) 97.3 F (36.3 C)  TempSrc: Oral  Oral Oral  Resp:  20    Height:      Weight:      SpO2:  97%     Weight change:   Intake/Output Summary (Last 24 hours) at 02/01/12 0920 Last data filed at 02/01/12 0428  Gross per 24 hour  Intake    240 ml  Output      0 ml  Net    240 ml   General: Morbidly obese black female comfortable, playing computer games and watching TV Lungs clear to auscultation bilaterally anteriorly without wheeze rhonchi or rales Cardiovascular regular rate rhythm without murmurs gallops rubs Abdomen obese, with subcutaneous edema and peau d'orange. Extremities: Malodorous with intertriginous discoloration and erythema in her popliteal areas and beneath her pannus. Peau d'orange present in the thighs and proximal calves. Minimal erythema. Tenderness is present.  Lab Results: Basic Metabolic Panel:  Lab 02/01/12 2130 01/31/12 1921  NA 139 135  K 3.7 4.0  CL 96 93*  CO2 32 33*  GLUCOSE 205* 136*  BUN 33* 35*  CREATININE 1.13* 1.15*  CALCIUM 9.5 9.8  MG -- --  PHOS -- --   Liver Function Tests: No results found for this basename: AST:2,ALT:2,ALKPHOS:2,BILITOT:2,PROT:2,ALBUMIN:2 in the last 168 hours No results found for this basename: LIPASE:2,AMYLASE:2 in the last 168 hours No results found for this basename: AMMONIA:2 in the last 168 hours CBC:  Lab 02/01/12 0450 01/31/12 1921  WBC 8.8 8.9  NEUTROABS -- 6.3  HGB 9.3* 9.5*  HCT  29.7* 30.0*  MCV 88.1 86.2  PLT 225 254   Cardiac Enzymes: No results found for this basename: CKTOTAL:3,CKMB:3,CKMBINDEX:3,TROPONINI:3 in the last 168 hours BNP: No results found for this basename: PROBNP:3 in the last 168 hours D-Dimer: No results found for this basename: DDIMER:2 in the last 168 hours CBG:  Lab 02/01/12 0746 02/01/12 0416 02/01/12 0011 01/31/12 2234  GLUCAP 180* 180* 147* 126*   Hemoglobin A1C: No results found for this basename: HGBA1C in the last 168 hours Fasting Lipid Panel: No results found for this basename: CHOL,HDL,LDLCALC,TRIG,CHOLHDL,LDLDIRECT in the last 865 hours Thyroid Function Tests: No results found for this basename: TSH,T4TOTAL,FREET4,T3FREE,THYROIDAB in the last 168 hours Coagulation: No results found for this basename: LABPROT:4,INR:4 in the last 168 hours Anemia Panel: No results found for this basename: VITAMINB12,FOLATE,FERRITIN,TIBC,IRON,RETICCTPCT in the last 168 hours Urine Drug Screen: Drugs of Abuse     Component Value Date/Time   LABOPIA NEGATIVE 02/25/2009 1755   LABOPIA NONE DETECTED 06/05/2007 1610   COCAINSCRNUR NEGATIVE 02/25/2009 1755   COCAINSCRNUR NONE DETECTED 06/05/2007 1610   LABBENZ NEGATIVE 02/25/2009 1755   LABBENZ NONE DETECTED 06/05/2007 1610   AMPHETMU NEGATIVE 02/25/2009 1755   AMPHETMU NONE DETECTED 06/05/2007 1610   THCU NONE DETECTED 06/05/2007 1610   LABBARB  Value: NONE DETECTED        DRUG SCREEN FOR MEDICAL PURPOSES ONLY.  IF CONFIRMATION IS NEEDED FOR ANY PURPOSE, NOTIFY LAB WITHIN 5 DAYS. 06/05/2007 1610    Micro Results: Recent Results (from the past 240 hour(s))  MRSA PCR SCREENING     Status: Abnormal   Collection Time   01/31/12 11:35 PM      Component Value Range Status Comment   MRSA by PCR POSITIVE (*) NEGATIVE Final    Scheduled Meds:   . sodium chloride   Intravenous Once  . allopurinol  300 mg Oral Daily  . aspirin EC  81 mg Oral Daily  . carvedilol  25 mg Oral BID WC  . Chlorhexidine  Gluconate Cloth  6 each Topical Q0600  . cloNIDine  0.2 mg Oral BID  . ferrous sulfate  325 mg Oral Daily  . heparin subcutaneous  5,000 Units Subcutaneous Q8H  . hydrALAZINE  100 mg Oral QID  .  HYDROmorphone (DILAUDID) injection  1 mg Intravenous Once  . insulin aspart protamine-insulin aspart  20 Units Subcutaneous TID WC  . labetalol  300 mg Oral BID  . mupirocin ointment  1 application Nasal BID  . nystatin cream   Topical BID  . sodium chloride  3 mL Intravenous Q12H  . spironolactone  25 mg Oral Daily  . torsemide  100 mg Oral BID  . vancomycin  1,500 mg Intravenous Once  . vancomycin  1,000 mg Intravenous Once  . vitamin C  500 mg Oral Daily  . DISCONTD: insulin lispro protamine-insulin lispro  20 Units Subcutaneous TID  . DISCONTD: torsemide  50 mg Oral BID  . DISCONTD: vancomycin  1,000 mg Intravenous Q8H   Continuous Infusions:  PRN Meds:.sodium chloride, acetaminophen, sodium chloride, DISCONTD: ondansetron (ZOFRAN) IV Assessment/Plan: Principal Problem:  *Cellulitis of left leg, mild. Will change to oral antibiotics. Has had multiple hospitalizations and at risk for MRSA. Will give doxycycline. Also, malodorous. Will add Augmentin for gram-negative and anaerobic coverage as well. May have Vicodin when necessary. Active Problems:  Chronic diastolic heart failure  Anasarca: Will increase Demadex  DM (diabetes mellitus), type 2, uncontrolled with complications  Intertrigo: Continue topical antifungal.  OBESITY, MORBID rendering patient bed bound  Rheumatoid arthritis  PACEMAKER, PERMANENT  COPD (chronic obstructive pulmonary disease)  Spoke with Dr. Juanetta Gosling. He will assume care of patient tomorrow. Have changed primary care provider in the computer, as well as attending physician..   LOS: 1 day   Kynzie Polgar L 02/01/2012, 9:20 AM

## 2012-02-01 NOTE — Progress Notes (Signed)
Patient reports pain to her lower extremities. Dr. Onalee Hua paged. -page returned sBAR completed. Verbal order received; one time dose of PO Tylenol 650 mg

## 2012-02-01 NOTE — Progress Notes (Signed)
Pt transferred to room 308, remained under my nursing care, no changes in assessment or status.

## 2012-02-02 LAB — GLUCOSE, CAPILLARY
Glucose-Capillary: 128 mg/dL — ABNORMAL HIGH (ref 70–99)
Glucose-Capillary: 166 mg/dL — ABNORMAL HIGH (ref 70–99)

## 2012-02-02 MED ORDER — CEFAZOLIN SODIUM 1-5 GM-% IV SOLN
1.0000 g | Freq: Three times a day (TID) | INTRAVENOUS | Status: DC
  Administered 2012-02-02 – 2012-02-04 (×6): 1 g via INTRAVENOUS
  Filled 2012-02-02 (×10): qty 50

## 2012-02-02 MED ORDER — SODIUM CHLORIDE 0.9 % IJ SOLN
INTRAMUSCULAR | Status: AC
  Administered 2012-02-02: 15:00:00
  Filled 2012-02-02: qty 3

## 2012-02-02 MED ORDER — NYSTATIN 100000 UNIT/GM EX CREA
TOPICAL_CREAM | CUTANEOUS | Status: AC
  Filled 2012-02-02: qty 15

## 2012-02-02 MED ORDER — VANCOMYCIN HCL IN DEXTROSE 1-5 GM/200ML-% IV SOLN
1000.0000 mg | Freq: Three times a day (TID) | INTRAVENOUS | Status: DC
  Administered 2012-02-02 – 2012-02-04 (×6): 1000 mg via INTRAVENOUS
  Filled 2012-02-02 (×7): qty 200

## 2012-02-02 NOTE — Progress Notes (Signed)
Unable to get patients weight because the beds scale is broken.  Patient is bed bound and can not stand to be weighed on the stand up scale.

## 2012-02-02 NOTE — Progress Notes (Signed)
Subjective: She was admitted with cellulitis of the legs. She is essentially bedbound. She says her legs feel worse today. She was admitted accidentally to the hospitalist service. When Dr. Lendell Caprice hospitalist attending saw her yesterday her legs were much improved but they are worse today.  Objective: Vital signs in last 24 hours: Temp:  [97.6 F (36.4 C)-98 F (36.7 C)] 97.8 F (36.6 C) (09/14 0551) Pulse Rate:  [76-79] 79  (09/14 0551) Resp:  [18] 18  (09/14 0551) BP: (110-133)/(70-77) 114/70 mmHg (09/14 0551) SpO2:  [93 %-97 %] 97 % (09/14 0551) Weight change:  Last BM Date: 01/30/12  Intake/Output from previous day: 09/13 0701 - 09/14 0700 In: 240 [P.O.:240] Out: -   PHYSICAL EXAM General appearance: alert, cooperative, no distress and morbidly obese Resp: clear to auscultation bilaterally Cardio: regular rate and rhythm, S1, S2 normal, no murmur, click, rub or gallop GI: soft, non-tender; bowel sounds normal; no masses,  no organomegaly Extremities: Her legs are swollen and erythematous. She does have some lymphedema  Lab Results:    Basic Metabolic Panel:  Basename 02/01/12 0450 01/31/12 1921  NA 139 135  K 3.7 4.0  CL 96 93*  CO2 32 33*  GLUCOSE 205* 136*  BUN 33* 35*  CREATININE 1.13* 1.15*  CALCIUM 9.5 9.8  MG -- --  PHOS -- --   Liver Function Tests: No results found for this basename: AST:2,ALT:2,ALKPHOS:2,BILITOT:2,PROT:2,ALBUMIN:2 in the last 72 hours No results found for this basename: LIPASE:2,AMYLASE:2 in the last 72 hours No results found for this basename: AMMONIA:2 in the last 72 hours CBC:  Basename 02/01/12 0450 01/31/12 1921  WBC 8.8 8.9  NEUTROABS -- 6.3  HGB 9.3* 9.5*  HCT 29.7* 30.0*  MCV 88.1 86.2  PLT 225 254   Cardiac Enzymes: No results found for this basename: CKTOTAL:3,CKMB:3,CKMBINDEX:3,TROPONINI:3 in the last 72 hours BNP: No results found for this basename: PROBNP:3 in the last 72 hours D-Dimer: No results found for  this basename: DDIMER:2 in the last 72 hours CBG:  Basename 02/01/12 1641 02/01/12 1156 02/01/12 0746 02/01/12 0416 02/01/12 0011 01/31/12 2234  GLUCAP 113* 143* 180* 180* 147* 126*   Hemoglobin A1C: No results found for this basename: HGBA1C in the last 72 hours Fasting Lipid Panel: No results found for this basename: CHOL,HDL,LDLCALC,TRIG,CHOLHDL,LDLDIRECT in the last 72 hours Thyroid Function Tests: No results found for this basename: TSH,T4TOTAL,FREET4,T3FREE,THYROIDAB in the last 72 hours Anemia Panel: No results found for this basename: VITAMINB12,FOLATE,FERRITIN,TIBC,IRON,RETICCTPCT in the last 72 hours Coagulation: No results found for this basename: LABPROT:2,INR:2 in the last 72 hours Urine Drug Screen: Drugs of Abuse     Component Value Date/Time   LABOPIA NEGATIVE 02/25/2009 1755   LABOPIA NONE DETECTED 06/05/2007 1610   COCAINSCRNUR NEGATIVE 02/25/2009 1755   COCAINSCRNUR NONE DETECTED 06/05/2007 1610   LABBENZ NEGATIVE 02/25/2009 1755   LABBENZ NONE DETECTED 06/05/2007 1610   AMPHETMU NEGATIVE 02/25/2009 1755   AMPHETMU NONE DETECTED 06/05/2007 1610   THCU NONE DETECTED 06/05/2007 1610   LABBARB  Value: NONE DETECTED        DRUG SCREEN FOR MEDICAL PURPOSES ONLY.  IF CONFIRMATION IS NEEDED FOR ANY PURPOSE, NOTIFY LAB WITHIN 5 DAYS. 06/05/2007 1610    Alcohol Level: No results found for this basename: ETH:2 in the last 72 hours Urinalysis: No results found for this basename: COLORURINE:2,APPERANCEUR:2,LABSPEC:2,PHURINE:2,GLUCOSEU:2,HGBUR:2,BILIRUBINUR:2,KETONESUR:2,PROTEINUR:2,UROBILINOGEN:2,NITRITE:2,LEUKOCYTESUR:2 in the last 72 hours Misc. Labs:  ABGS No results found for this basename: PHART,PCO2,PO2ART,TCO2,HCO3 in the last 72 hours CULTURES Recent Results (from the  past 240 hour(s))  MRSA PCR SCREENING     Status: Abnormal   Collection Time   01/31/12 11:35 PM      Component Value Range Status Comment   MRSA by PCR POSITIVE (*) NEGATIVE Final     Studies/Results: No results found.  Medications:  Prior to Admission:  Prescriptions prior to admission  Medication Sig Dispense Refill  . albuterol (PROVENTIL) (2.5 MG/3ML) 0.083% nebulizer solution Take 2.5 mg by nebulization every 6 (six) hours as needed. For shortness of breath      . allopurinol (ZYLOPRIM) 300 MG tablet Take 300 mg by mouth daily.      Marland Kitchen aspirin EC 81 MG EC tablet Take 1 tablet (81 mg total) by mouth daily.      . carvedilol (COREG) 25 MG tablet Take 1 tablet (25 mg total) by mouth 2 (two) times daily with a meal.  180 tablet  3  . cloNIDine (CATAPRES) 0.2 MG tablet Take 0.2 mg by mouth 2 (two) times daily.      . ferrous sulfate 325 (65 FE) MG tablet Take 325 mg by mouth daily.        . hydrALAZINE (APRESOLINE) 100 MG tablet Take 100 mg by mouth 4 (four) times daily.      . insulin lispro protamine-insulin lispro (HUMALOG 75/25) (75-25) 100 UNIT/ML SUSP Inject 20 Units into the skin 3 (three) times daily.       Marland Kitchen labetalol (NORMODYNE) 300 MG tablet Take 300 mg by mouth 2 (two) times daily.      . Multiple Vitamins-Minerals (MULTIVITAMINS THER. W/MINERALS) TABS Take 1 tablet by mouth daily.        Marland Kitchen neomycin-bacitracin-polymyxin (NEOSPORIN) [phone] ointment APPLY TO YOUR LEGS ON EVERY Tuesday, Thursday, AND Saturday.  28.3 g  0  . nystatin (MYCOSTATIN) cream APPLY TO YOUR SKIN FOLDS AND LEGS EVERY Monday, Wednesday , AND Friday.  30 g  1  . spironolactone (ALDACTONE) 25 MG tablet Take 25 mg by mouth daily.      Marland Kitchen torsemide (DEMADEX) 100 MG tablet Take 50 mg by mouth 2 (two) times daily.      . vitamin C (ASCORBIC ACID) 500 MG tablet Take 500 mg by mouth daily.       Scheduled:   . allopurinol  300 mg Oral Daily  . aspirin EC  81 mg Oral Daily  . carvedilol  25 mg Oral BID WC  .  ceFAZolin (ANCEF) IV  1 g Intravenous Q8H  . Chlorhexidine Gluconate Cloth  6 each Topical Q0600  . cloNIDine  0.2 mg Oral BID  . ferrous sulfate  325 mg Oral Daily  . heparin  subcutaneous  5,000 Units Subcutaneous Q8H  . hydrALAZINE  100 mg Oral QID  . insulin aspart protamine-insulin aspart  20 Units Subcutaneous TID WC  . labetalol  300 mg Oral BID  . mupirocin ointment  1 application Nasal BID  . nystatin cream   Topical BID  . sodium chloride  3 mL Intravenous Q12H  . spironolactone  25 mg Oral Daily  . torsemide  100 mg Oral BID  . vancomycin  1,000 mg Intravenous Q8H  . vitamin C  500 mg Oral Daily  . DISCONTD: amoxicillin-clavulanate  1 tablet Oral Q12H  . DISCONTD: doxycycline  100 mg Oral Q12H   Continuous:  ZOX:WRUEAV chloride, acetaminophen, HYDROcodone-acetaminophen, sodium chloride  Assesment: She has cellulitis. This seems to be worse she has multiple other medical problems as noted Principal Problem:  *  Cellulitis of left leg Active Problems:  DM (diabetes mellitus), type 2, uncontrolled with complications  OBESITY, MORBID  Rheumatoid arthritis  PACEMAKER, PERMANENT  Chronic diastolic heart failure  Intertrigo  COPD (chronic obstructive pulmonary disease)  Anasarca    Plan: I'm going to switch her back to IV at least for the next 24-48 hours    LOS: 2 days   Thaine Garriga L 02/02/2012, 9:58 AM

## 2012-02-02 NOTE — Consult Note (Signed)
ANTIBIOTIC CONSULT NOTE - INITIAL  Pharmacy Consult for Vancomycin Indication: cellulitis  No Known Allergies  Patient Measurements: Height: 5' (152.4 cm) Weight: 410 lb (185.975 kg) IBW/kg (Calculated) : 45.5   Vital Signs: Temp: 97.8 F (36.6 C) (09/14 0551) Temp src: Oral (09/14 0551) BP: 114/70 mmHg (09/14 0551) Pulse Rate: 79  (09/14 0551) Intake/Output from previous day: 09/13 0701 - 09/14 0700 In: 240 [P.O.:240] Out: -  Intake/Output from this shift:    Labs:  [facility] 02/01/12 0450 01/31/12 1921  WBC 8.8 8.9  HGB 9.3* 9.5*  PLT 225 254  LABCREA -- --  CREATININE 1.13* 1.15*   Estimated Creatinine Clearance: 89.3 ml/min (by C-G formula based on Cr of 1.13). No results found for this basename: VANCOTROUGH:2,VANCOPEAK:2,VANCORANDOM:2,GENTTROUGH:2,GENTPEAK:2,GENTRANDOM:2,TOBRATROUGH:2,TOBRAPEAK:2,TOBRARND:2,AMIKACINPEAK:2,AMIKACINTROU:2,AMIKACIN:2, in the last 72 hours   Microbiology: Recent Results (from the past 720 hour(s))  MRSA PCR SCREENING     Status: Abnormal   Collection Time   01/31/12 11:35 PM      Component Value Range Status Comment   MRSA by PCR POSITIVE (*) NEGATIVE Final    Medical History: Past Medical History  Diagnosis Date  . Morbid obesity   . Hypertension   . CHF (congestive heart failure)     Diastolic;   . Pacemaker   . COPD (chronic obstructive pulmonary disease)   . Oxygen dependent   . Pickwickian syndrome   . Coronary artery disease   . Shortness of breath   . Obesity hypoventilation syndrome   . Arthritis   . Hiatal hernia   . Tachy-brady syndrome     s/p pacemaker 2008  . Cellulitis     Recurrent bilateral LE  . Respiratory failure, acute 12/2009    Sec to diastolic CHF and COPD  . Constipation 04/02/2011  . High cholesterol   . Peripheral vascular disease   . Anginal pain   . Myocardial infarction 1990's  . Chronic bronchitis   . Sleep apnea   . Type II diabetes mellitus   . Gout    Medications:  Scheduled:      . allopurinol  300 mg Oral Daily  . aspirin EC  81 mg Oral Daily  . carvedilol  25 mg Oral BID WC  .  ceFAZolin (ANCEF) IV  1 g Intravenous Q8H  . Chlorhexidine Gluconate Cloth  6 each Topical Q0600  . cloNIDine  0.2 mg Oral BID  . ferrous sulfate  325 mg Oral Daily  . heparin subcutaneous  5,000 Units Subcutaneous Q8H  . hydrALAZINE  100 mg Oral QID  . insulin aspart protamine-insulin aspart  20 Units Subcutaneous TID WC  . labetalol  300 mg Oral BID  . mupirocin ointment  1 application Nasal BID  . nystatin cream   Topical BID  . sodium chloride  3 mL Intravenous Q12H  . spironolactone  25 mg Oral Daily  . torsemide  100 mg Oral BID  . vancomycin  1,000 mg Intravenous Q8H  . vitamin C  500 mg Oral Daily  . DISCONTD: amoxicillin-clavulanate  1 tablet Oral Q12H  . DISCONTD: doxycycline  100 mg Oral Q12H   Assessment: 56yo morbidly obese female with LE cellulitis.  Good renal fxn. Estimated Creatinine Clearance: 89.3 ml/min (by C-G formula based on Cr of 1.13).  Goal of Therapy:  Vancomycin trough level 10-15 mcg/ml Eradicate infection.  Plan: Vancomycin 1gm iv q8hrs Check trough at steady state Monitor labs, renal fxn, and cultures per protocol  Valrie Hart A 02/02/2012,9:52 AM

## 2012-02-03 LAB — GLUCOSE, CAPILLARY
Glucose-Capillary: 155 mg/dL — ABNORMAL HIGH (ref 70–99)
Glucose-Capillary: 141 mg/dL — ABNORMAL HIGH (ref 70–99)
Glucose-Capillary: 158 mg/dL — ABNORMAL HIGH (ref 70–99)

## 2012-02-03 NOTE — Progress Notes (Signed)
Subjective: She says she feels a little bit better. She still has pain in her leg. She feels like she has some inflammation still. She has no other new complaints.  Objective: Vital signs in last 24 hours: Temp:  [98 F (36.7 C)-98.4 F (36.9 C)] 98 F (36.7 C) (09/15 0509) Pulse Rate:  [78-80] 79  (09/15 0509) Resp:  [18] 18  (09/15 0509) BP: (107-143)/(61-79) 107/61 mmHg (09/15 0509) SpO2:  [95 %-96 %] 95 % (09/15 0509) Weight change:  Last BM Date: 01/30/12  Intake/Output from previous day: 09/14 0701 - 09/15 0700 In: 1540 [P.O.:840; IV Piggyback:700] Out: -   PHYSICAL EXAM General appearance: alert, cooperative, no distress and morbidly obese Resp: clear to auscultation bilaterally Cardio: regular rate and rhythm, S1, S2 normal, no murmur, click, rub or gallop GI: soft, non-tender; bowel sounds normal; no masses,  no organomegaly Extremities: extremities normal, atraumatic, no cyanosis or edema  Lab Results:    Basic Metabolic Panel:  Basename 02/01/12 0450 01/31/12 1921  NA 139 135  K 3.7 4.0  CL 96 93*  CO2 32 33*  GLUCOSE 205* 136*  BUN 33* 35*  CREATININE 1.13* 1.15*  CALCIUM 9.5 9.8  MG -- --  PHOS -- --   Liver Function Tests: No results found for this basename: AST:2,ALT:2,ALKPHOS:2,BILITOT:2,PROT:2,ALBUMIN:2 in the last 72 hours No results found for this basename: LIPASE:2,AMYLASE:2 in the last 72 hours No results found for this basename: AMMONIA:2 in the last 72 hours CBC:  Basename 02/01/12 0450 01/31/12 1921  WBC 8.8 8.9  NEUTROABS -- 6.3  HGB 9.3* 9.5*  HCT 29.7* 30.0*  MCV 88.1 86.2  PLT 225 254   Cardiac Enzymes: No results found for this basename: CKTOTAL:3,CKMB:3,CKMBINDEX:3,TROPONINI:3 in the last 72 hours BNP: No results found for this basename: PROBNP:3 in the last 72 hours D-Dimer: No results found for this basename: DDIMER:2 in the last 72 hours CBG:  Basename 02/03/12 0734 02/02/12 2104 02/02/12 1621 02/02/12 1151 02/01/12  1641 02/01/12 1156  GLUCAP 149* 123* 166* 128* 113* 143*   Hemoglobin A1C: No results found for this basename: HGBA1C in the last 72 hours Fasting Lipid Panel: No results found for this basename: CHOL,HDL,LDLCALC,TRIG,CHOLHDL,LDLDIRECT in the last 72 hours Thyroid Function Tests: No results found for this basename: TSH,T4TOTAL,FREET4,T3FREE,THYROIDAB in the last 72 hours Anemia Panel: No results found for this basename: VITAMINB12,FOLATE,FERRITIN,TIBC,IRON,RETICCTPCT in the last 72 hours Coagulation: No results found for this basename: LABPROT:2,INR:2 in the last 72 hours Urine Drug Screen: Drugs of Abuse     Component Value Date/Time   LABOPIA NEGATIVE 02/25/2009 1755   LABOPIA NONE DETECTED 06/05/2007 1610   COCAINSCRNUR NEGATIVE 02/25/2009 1755   COCAINSCRNUR NONE DETECTED 06/05/2007 1610   LABBENZ NEGATIVE 02/25/2009 1755   LABBENZ NONE DETECTED 06/05/2007 1610   AMPHETMU NEGATIVE 02/25/2009 1755   AMPHETMU NONE DETECTED 06/05/2007 1610   THCU NONE DETECTED 06/05/2007 1610   LABBARB  Value: NONE DETECTED        DRUG SCREEN FOR MEDICAL PURPOSES ONLY.  IF CONFIRMATION IS NEEDED FOR ANY PURPOSE, NOTIFY LAB WITHIN 5 DAYS. 06/05/2007 1610    Alcohol Level: No results found for this basename: ETH:2 in the last 72 hours Urinalysis: No results found for this basename: COLORURINE:2,APPERANCEUR:2,LABSPEC:2,PHURINE:2,GLUCOSEU:2,HGBUR:2,BILIRUBINUR:2,KETONESUR:2,PROTEINUR:2,UROBILINOGEN:2,NITRITE:2,LEUKOCYTESUR:2 in the last 72 hours Misc. Labs:  ABGS No results found for this basename: PHART,PCO2,PO2ART,TCO2,HCO3 in the last 72 hours CULTURES Recent Results (from the past 240 hour(s))  MRSA PCR SCREENING     Status: Abnormal   Collection Time  01/31/12 11:35 PM      Component Value Range Status Comment   MRSA by PCR POSITIVE (*) NEGATIVE Final    Studies/Results: No results found.  Medications:  Scheduled:   . allopurinol  300 mg Oral Daily  . aspirin EC  81 mg Oral Daily  .  carvedilol  25 mg Oral BID WC  .  ceFAZolin (ANCEF) IV  1 g Intravenous Q8H  . Chlorhexidine Gluconate Cloth  6 each Topical Q0600  . cloNIDine  0.2 mg Oral BID  . ferrous sulfate  325 mg Oral Daily  . heparin subcutaneous  5,000 Units Subcutaneous Q8H  . hydrALAZINE  100 mg Oral QID  . insulin aspart protamine-insulin aspart  20 Units Subcutaneous TID WC  . labetalol  300 mg Oral BID  . mupirocin ointment  1 application Nasal BID  . nystatin cream   Topical BID  . sodium chloride  3 mL Intravenous Q12H  . sodium chloride      . spironolactone  25 mg Oral Daily  . torsemide  100 mg Oral BID  . vancomycin  1,000 mg Intravenous Q8H  . vitamin C  500 mg Oral Daily  . DISCONTD: amoxicillin-clavulanate  1 tablet Oral Q12H  . DISCONTD: doxycycline  100 mg Oral Q12H   Continuous:  ZOX:WRUEAV chloride, acetaminophen, HYDROcodone-acetaminophen, sodium chloride  Assesment: She will continue with current treatments. This will include intravenous antibiotics. She still shows some signs of cellulitis. Principal Problem:  *Cellulitis of left leg Active Problems:  DM (diabetes mellitus), type 2, uncontrolled with complications  OBESITY, MORBID  Rheumatoid arthritis  PACEMAKER, PERMANENT  Chronic diastolic heart failure  Intertrigo  COPD (chronic obstructive pulmonary disease)  Anasarca    Plan: Continue IV antibiotics    LOS: 3 days   Kelise Kuch L 02/03/2012, 8:08 AM

## 2012-02-03 NOTE — Consult Note (Signed)
ANTIBIOTIC CONSULT NOTE  Pharmacy Consult for Vancomycin and Ancef Indication: cellulitis  No Known Allergies  Patient Measurements: Height: 5' (152.4 cm) Weight: 410 lb (185.975 kg) IBW/kg (Calculated) : 45.5   Vital Signs: Temp: 98 F (36.7 C) (09/15 0509) Temp src: Oral (09/15 0509) BP: 107/61 mmHg (09/15 0509) Pulse Rate: 79  (09/15 0509) Intake/Output from previous day: 09/14 0701 - 09/15 0700 In: 1540 [P.O.:840; IV Piggyback:700] Out: -  Intake/Output from this shift:    Labs:  [facility] 02/01/12 0450 01/31/12 1921  WBC 8.8 8.9  HGB 9.3* 9.5*  PLT 225 254  LABCREA -- --  CREATININE 1.13* 1.15*   Estimated Creatinine Clearance: 89.3 ml/min (by C-G formula based on Cr of 1.13). No results found for this basename: VANCOTROUGH:2,VANCOPEAK:2,VANCORANDOM:2,GENTTROUGH:2,GENTPEAK:2,GENTRANDOM:2,TOBRATROUGH:2,TOBRAPEAK:2,TOBRARND:2,AMIKACINPEAK:2,AMIKACINTROU:2,AMIKACIN:2, in the last 72 hours   Microbiology: Recent Results (from the past 720 hour(s))  MRSA PCR SCREENING     Status: Abnormal   Collection Time   01/31/12 11:35 PM      Component Value Range Status Comment   MRSA by PCR POSITIVE (*) NEGATIVE Final    Medical History: Past Medical History  Diagnosis Date  . Morbid obesity   . Hypertension   . CHF (congestive heart failure)     Diastolic;   . Pacemaker   . COPD (chronic obstructive pulmonary disease)   . Oxygen dependent   . Pickwickian syndrome   . Coronary artery disease   . Shortness of breath   . Obesity hypoventilation syndrome   . Arthritis   . Hiatal hernia   . Tachy-brady syndrome     s/p pacemaker 2008  . Cellulitis     Recurrent bilateral LE  . Respiratory failure, acute 12/2009    Sec to diastolic CHF and COPD  . Constipation 04/02/2011  . High cholesterol   . Peripheral vascular disease   . Anginal pain   . Myocardial infarction 1990's  . Chronic bronchitis   . Sleep apnea   . Type II diabetes mellitus   . Gout     Medications:  Scheduled:     . allopurinol  300 mg Oral Daily  . aspirin EC  81 mg Oral Daily  . carvedilol  25 mg Oral BID WC  .  ceFAZolin (ANCEF) IV  1 g Intravenous Q8H  . Chlorhexidine Gluconate Cloth  6 each Topical Q0600  . cloNIDine  0.2 mg Oral BID  . ferrous sulfate  325 mg Oral Daily  . heparin subcutaneous  5,000 Units Subcutaneous Q8H  . hydrALAZINE  100 mg Oral QID  . insulin aspart protamine-insulin aspart  20 Units Subcutaneous TID WC  . labetalol  300 mg Oral BID  . mupirocin ointment  1 application Nasal BID  . nystatin cream   Topical BID  . sodium chloride  3 mL Intravenous Q12H  . sodium chloride      . spironolactone  25 mg Oral Daily  . torsemide  100 mg Oral BID  . vancomycin  1,000 mg Intravenous Q8H  . vitamin C  500 mg Oral Daily   Assessment: 56yo morbidly obese female with LE cellulitis started on Ancef and Vancomycin.  Good renal fxn. Estimated Creatinine Clearance: 89.3 ml/min (by C-G formula based on Cr of 1.13).  Goal of Therapy:  Vancomycin trough level 10-15 mcg/ml Eradicate infection.  Plan: Vancomycin 1gm iv q8hrs Check trough tomorrow Ancef 1gm iv q8hrs Monitor labs, renal fxn, and cultures per protocol  Margo Aye, Cherity Blickenstaff A 02/03/2012,9:30 AM

## 2012-02-04 LAB — GLUCOSE, CAPILLARY: Glucose-Capillary: 133 mg/dL — ABNORMAL HIGH (ref 70–99)

## 2012-02-04 MED ORDER — DOXYCYCLINE HYCLATE 100 MG PO TABS: 100.0000 mg | ORAL_TABLET | Freq: Two times a day (BID) | ORAL | Status: AC

## 2012-02-04 MED ORDER — SULFAMETHOXAZOLE-TRIMETHOPRIM 800-160 MG PO TABS: 1.0000 | ORAL_TABLET | Freq: Two times a day (BID) | ORAL | Status: AC

## 2012-02-04 MED ORDER — DIPHENHYDRAMINE HCL 25 MG PO CAPS
25.0000 mg | ORAL_CAPSULE | Freq: Four times a day (QID) | ORAL | Status: DC | PRN
  Administered 2012-02-04: 25 mg via ORAL
  Filled 2012-02-04: qty 1

## 2012-02-04 NOTE — Clinical Social Work Psychosocial (Signed)
    Clinical Social Work Department BRIEF PSYCHOSOCIAL ASSESSMENT 02/04/2012  Patient:  MALLISSA, LORENZEN     Account Number:  [id number]     Admit date:  01/31/2012  Clinical Social Worker:  Santa Genera, CLINICAL SOCIAL WORKER  Date/Time:  02/04/2012 09:30 AM  Referred by:  RN  Date Referred:  02/04/2012 Referred for  Transportation assistance   Other Referral:   Interview type:  Patient Other interview type:    PSYCHOSOCIAL DATA Living Status:  FAMILY Admitted from facility:   Level of care:   Primary support name:  Doylene Canard Primary support relationship to patient:  CHILD, ADULT Degree of support available:   Significant    CURRENT CONCERNS Current Concerns  Other - See comment   Other Concerns:   EMS transport    SOCIAL WORK ASSESSMENT / PLAN CSW met w patient re EMS transport.  Patient has no insurance at present.  Says Medicaid was dropped last month because she needed "$6000 in medical bills".  Per patient, at admission she was told she would have that amount from this current hospitalization.  Patient and family understand that they may receive a bill for the EMS transport today as she is not currently insured.  Patient and family voiced understanding that they need to contact patient's Medicaid worker at [facility] and B Ratliffe ([facility] financial counselor) if they need help in getting patient's Medicaid reinstated.  Patient and family also understand that they can submit bill from current transport to Medicaid.   Assessment/plan status:  No Further Intervention Required Other assessment/ plan:   Information/referral to community resources:    PATIENT'S/FAMILY'S RESPONSE TO PLAN OF CARE: Patient and family appreciative.   Santa Genera, LCSW Clinical Social Worker [phone])

## 2012-02-04 NOTE — Care Management Note (Signed)
    Page 1 of 1   02/04/2012     11:13:11 AM   CARE MANAGEMENT NOTE 02/04/2012  Patient:  [name], [name]   Account Number:  [id number]  Date Initiated:  02/04/2012  Documentation initiated by:  Sharrie Rothman  Subjective/Objective Assessment:   Pt admitted from home with cellulitis. Pt lives with family and is bedbound. Pt will return home at discharge. Pt has DME in the home.     Action/Plan:   Pt chose [facility] RN and PT. Alroy Bailiff of [facility] is aware and will collect the pts information from the chart. Pt is discharged home today.   Anticipated DC Date:  02/04/2012   Anticipated DC Plan:  HOME W HOME HEALTH SERVICES      DC Planning Services  CM consult      [facility] Choice  HOME HEALTH   Choice offered to / List presented to:  C-1 Patient        HH arranged  HH-1 RN  HH-2 PT      [facility] agency  [facility]   Status of service:  Completed, signed off Medicare Important Message given?  NO (If response is "NO", the following Medicare IM given date fields will be blank) Date Medicare IM given:   Date Additional Medicare IM given:    Discharge Disposition:  HOME W HOME HEALTH SERVICES  Per UR Regulation:    If discussed at Long Length of Stay Meetings, dates discussed:    Comments:  02/04/12 1112 Arlyss Queen, RN BSN CM

## 2012-02-04 NOTE — Progress Notes (Signed)
Subjective: She is awake and alert and wants to go home. Her legs are much improved. She has no other new complaint  Objective: Vital signs in last 24 hours: Temp:  [97.3 F (36.3 C)-98.2 F (36.8 C)] 98.2 F (36.8 C) (09/16 0605) Pulse Rate:  [73-79] 74  (09/16 0605) Resp:  [18] 18  (09/16 0605) BP: (106-136)/(65-83) 136/83 mmHg (09/16 0605) SpO2:  [95 %-98 %] 96 % (09/16 0605) Weight change:  Last BM Date: 01/30/12 (pt. states this is normal for her)  Intake/Output from previous day: 09/15 0701 - 09/16 0700 In: 2193 [P.O.:1440; I.V.:3; IV Piggyback:750] Out: 0   PHYSICAL EXAM General appearance: alert, cooperative and morbidly obese Resp: clear to auscultation bilaterally Cardio: regular rate and rhythm, S1, S2 normal, no murmur, click, rub or gallop GI: soft, non-tender; bowel sounds normal; no masses,  no organomegaly Extremities: Much improved with no erythema now  Lab Results:    Basic Metabolic Panel: No results found for this basename: NA:2,K:2,CL:2,CO2:2,GLUCOSE:2,BUN:2,CREATININE:2,CALCIUM:2,MG:2,PHOS:2 in the last 72 hours Liver Function Tests: No results found for this basename: AST:2,ALT:2,ALKPHOS:2,BILITOT:2,PROT:2,ALBUMIN:2 in the last 72 hours No results found for this basename: LIPASE:2,AMYLASE:2 in the last 72 hours No results found for this basename: AMMONIA:2 in the last 72 hours CBC: No results found for this basename: WBC:2,NEUTROABS:2,HGB:2,HCT:2,MCV:2,PLT:2 in the last 72 hours Cardiac Enzymes: No results found for this basename: CKTOTAL:3,CKMB:3,CKMBINDEX:3,TROPONINI:3 in the last 72 hours BNP: No results found for this basename: PROBNP:3 in the last 72 hours D-Dimer: No results found for this basename: DDIMER:2 in the last 72 hours CBG:  Basename 02/04/12 0725 02/03/12 2129 02/03/12 1644 02/03/12 1139 02/03/12 0734 02/02/12 2104  GLUCAP 133* 158* 141* 155* 149* 123*   Hemoglobin A1C: No results found for this basename: HGBA1C in the last  72 hours Fasting Lipid Panel: No results found for this basename: CHOL,HDL,LDLCALC,TRIG,CHOLHDL,LDLDIRECT in the last 72 hours Thyroid Function Tests: No results found for this basename: TSH,T4TOTAL,FREET4,T3FREE,THYROIDAB in the last 72 hours Anemia Panel: No results found for this basename: VITAMINB12,FOLATE,FERRITIN,TIBC,IRON,RETICCTPCT in the last 72 hours Coagulation: No results found for this basename: LABPROT:2,INR:2 in the last 72 hours Urine Drug Screen: Drugs of Abuse     Component Value Date/Time   LABOPIA NEGATIVE 02/25/2009 1755   LABOPIA NONE DETECTED 06/05/2007 1610   COCAINSCRNUR NEGATIVE 02/25/2009 1755   COCAINSCRNUR NONE DETECTED 06/05/2007 1610   LABBENZ NEGATIVE 02/25/2009 1755   LABBENZ NONE DETECTED 06/05/2007 1610   AMPHETMU NEGATIVE 02/25/2009 1755   AMPHETMU NONE DETECTED 06/05/2007 1610   THCU NONE DETECTED 06/05/2007 1610   LABBARB  Value: NONE DETECTED        DRUG SCREEN FOR MEDICAL PURPOSES ONLY.  IF CONFIRMATION IS NEEDED FOR ANY PURPOSE, NOTIFY LAB WITHIN 5 DAYS. 06/05/2007 1610    Alcohol Level: No results found for this basename: ETH:2 in the last 72 hours Urinalysis: No results found for this basename: COLORURINE:2,APPERANCEUR:2,LABSPEC:2,PHURINE:2,GLUCOSEU:2,HGBUR:2,BILIRUBINUR:2,KETONESUR:2,PROTEINUR:2,UROBILINOGEN:2,NITRITE:2,LEUKOCYTESUR:2 in the last 72 hours Misc. Labs:  ABGS No results found for this basename: PHART,PCO2,PO2ART,TCO2,HCO3 in the last 72 hours CULTURES Recent Results (from the past 240 hour(s))  MRSA PCR SCREENING     Status: Abnormal   Collection Time   01/31/12 11:35 PM      Component Value Range Status Comment   MRSA by PCR POSITIVE (*) NEGATIVE Final    Studies/Results: No results found.  Medications:  Prior to Admission:  Prescriptions prior to admission  Medication Sig Dispense Refill  . albuterol (PROVENTIL) (2.5 MG/3ML) 0.083% nebulizer solution Take 2.5 mg by  nebulization every 6 (six) hours as needed. For shortness  of breath      . allopurinol (ZYLOPRIM) 300 MG tablet Take 300 mg by mouth daily.      Marland Kitchen aspirin EC 81 MG EC tablet Take 1 tablet (81 mg total) by mouth daily.      . carvedilol (COREG) 25 MG tablet Take 1 tablet (25 mg total) by mouth 2 (two) times daily with a meal.  180 tablet  3  . cloNIDine (CATAPRES) 0.2 MG tablet Take 0.2 mg by mouth 2 (two) times daily.      . ferrous sulfate 325 (65 FE) MG tablet Take 325 mg by mouth daily.        . hydrALAZINE (APRESOLINE) 100 MG tablet Take 100 mg by mouth 4 (four) times daily.      . insulin lispro protamine-insulin lispro (HUMALOG 75/25) (75-25) 100 UNIT/ML SUSP Inject 20 Units into the skin 3 (three) times daily.       Marland Kitchen labetalol (NORMODYNE) 300 MG tablet Take 300 mg by mouth 2 (two) times daily.      . Multiple Vitamins-Minerals (MULTIVITAMINS THER. W/MINERALS) TABS Take 1 tablet by mouth daily.        Marland Kitchen neomycin-bacitracin-polymyxin (NEOSPORIN) [phone] ointment APPLY TO YOUR LEGS ON EVERY Tuesday, Thursday, AND Saturday.  28.3 g  0  . nystatin (MYCOSTATIN) cream APPLY TO YOUR SKIN FOLDS AND LEGS EVERY Monday, Wednesday , AND Friday.  30 g  1  . spironolactone (ALDACTONE) 25 MG tablet Take 25 mg by mouth daily.      Marland Kitchen torsemide (DEMADEX) 100 MG tablet Take 50 mg by mouth 2 (two) times daily.      . vitamin C (ASCORBIC ACID) 500 MG tablet Take 500 mg by mouth daily.       Scheduled:   . allopurinol  300 mg Oral Daily  . aspirin EC  81 mg Oral Daily  . carvedilol  25 mg Oral BID WC  .  ceFAZolin (ANCEF) IV  1 g Intravenous Q8H  . Chlorhexidine Gluconate Cloth  6 each Topical Q0600  . cloNIDine  0.2 mg Oral BID  . ferrous sulfate  325 mg Oral Daily  . heparin subcutaneous  5,000 Units Subcutaneous Q8H  . hydrALAZINE  100 mg Oral QID  . insulin aspart protamine-insulin aspart  20 Units Subcutaneous TID WC  . labetalol  300 mg Oral BID  . mupirocin ointment  1 application Nasal BID  . nystatin cream   Topical BID  . sodium chloride  3 mL  Intravenous Q12H  . spironolactone  25 mg Oral Daily  . torsemide  100 mg Oral BID  . vancomycin  1,000 mg Intravenous Q8H  . vitamin C  500 mg Oral Daily   Continuous:  ZOX:WRUEAV chloride, acetaminophen, diphenhydrAMINE, HYDROcodone-acetaminophen, sodium chloride  Assesment: She was admitted with cellulitis of the legs. She is much improved and I think ready for discharge now. She has multiple other medical problems as noted. These are pretty stable. I am attempting to get her a motorized wheelchair at home Principal Problem:  *Cellulitis of left leg Active Problems:  DM (diabetes mellitus), type 2, uncontrolled with complications  OBESITY, MORBID  Rheumatoid arthritis  PACEMAKER, PERMANENT  Chronic diastolic heart failure  Intertrigo  COPD (chronic obstructive pulmonary disease)  Anasarca    Plan: She will go home with home health services    LOS: 4 days   Abbiegail Landgren L 02/04/2012, 8:54 AM

## 2012-02-04 NOTE — Progress Notes (Signed)
IV removed, site WNL.  Pt given d/c instructions, new prescriptions called into [facility], pt aware.  Discussed home care with patient and discussed home medications, patient verbalizes understanding, teachback completed. F/U appointment to be made by pt, if needed, as pt will have home health after discharge. Pt is stable at this time and desires to go home.  Pt to be transported home by EMS.

## 2012-02-06 NOTE — Discharge Summary (Signed)
Physician Discharge Summary  Patient ID: [name] MRN: [id number] DOB/AGE: July 12, 1955 56 y.o. Primary Care Physician:Latresha Yahr L, MD Admit date: 01/31/2012 Discharge date: 02/06/2012    Discharge Diagnoses:   Principal Problem:  *Cellulitis of left leg Active Problems:  DM (diabetes mellitus), type 2, uncontrolled with complications  OBESITY, MORBID  Rheumatoid arthritis  PACEMAKER, PERMANENT  Chronic diastolic heart failure  Intertrigo  COPD (chronic obstructive pulmonary disease)  Anasarca     Medication List     As of 02/06/2012  9:33 AM    TAKE these medications         albuterol (2.5 MG/3ML) 0.083% nebulizer solution   Commonly known as: PROVENTIL   Take 2.5 mg by nebulization every 6 (six) hours as needed. For shortness of breath      allopurinol 300 MG tablet   Commonly known as: ZYLOPRIM   Take 300 mg by mouth daily.      aspirin 81 MG EC tablet   Take 1 tablet (81 mg total) by mouth daily.      carvedilol 25 MG tablet   Commonly known as: COREG   Take 1 tablet (25 mg total) by mouth 2 (two) times daily with a meal.      cloNIDine 0.2 MG tablet   Commonly known as: CATAPRES   Take 0.2 mg by mouth 2 (two) times daily.      doxycycline 100 MG tablet   Commonly known as: VIBRA-TABS   Take 1 tablet (100 mg total) by mouth 2 (two) times daily.      ferrous sulfate 325 (65 FE) MG tablet   Take 325 mg by mouth daily.      hydrALAZINE 100 MG tablet   Commonly known as: APRESOLINE   Take 100 mg by mouth 4 (four) times daily.      insulin lispro protamine-insulin lispro (75-25) 100 UNIT/ML Susp   Commonly known as: HUMALOG 75/25   Inject 20 Units into the skin 3 (three) times daily.      labetalol 300 MG tablet   Commonly known as: NORMODYNE   Take 300 mg by mouth 2 (two) times daily.      multivitamins ther. w/minerals Tabs   Take 1 tablet by mouth daily.      neomycin-bacitracin-polymyxin [phone] ointment   APPLY TO YOUR LEGS ON  EVERY Tuesday, Thursday, AND Saturday.      nystatin cream   Commonly known as: MYCOSTATIN   APPLY TO YOUR SKIN FOLDS AND LEGS EVERY Monday, Wednesday , AND Friday.      spironolactone 25 MG tablet   Commonly known as: ALDACTONE   Take 25 mg by mouth daily.      sulfamethoxazole-trimethoprim 800-160 MG per tablet   Commonly known as: BACTRIM DS,SEPTRA DS   Take 1 tablet by mouth 2 (two) times daily.      torsemide 100 MG tablet   Commonly known as: DEMADEX   Take 50 mg by mouth 2 (two) times daily.      vitamin C 500 MG tablet   Commonly known as: ASCORBIC ACID   Take 500 mg by mouth daily.        Discharged Condition: Improved    Consults: None  Significant Diagnostic Studies: No results found.  Lab Results: Basic Metabolic Panel: No results found for this basename: NA:2,K:2,CL:2,CO2:2,GLUCOSE:2,BUN:2,CREATININE:2,CALCIUM:2,MG:2,PHOS:2 in the last 72 hours Liver Function Tests: No results found for this basename: AST:2,ALT:2,ALKPHOS:2,BILITOT:2,PROT:2,ALBUMIN:2 in the last 72 hours   CBC: No results found for  this basename: WBC:2,NEUTROABS:2,HGB:2,HCT:2,MCV:2,PLT:2 in the last 72 hours  Recent Results (from the past 240 hour(s))  MRSA PCR SCREENING     Status: Abnormal   Collection Time   01/31/12 11:35 PM      Component Value Range Status Comment   MRSA by PCR POSITIVE (*) NEGATIVE Final      Hospital Course: She was admitted with cellulitis of the legs. She was started on IV antibiotics. She improved rapidly and was switched to by mouth and then got a little bit worse so she was put back on IV antibiotics. She was much improved at the time of discharge her legs were not erythematous and not sore.  Discharge Exam: Blood pressure 136/83, pulse 74, temperature 98.2 F (36.8 C), temperature source Oral, resp. rate 18, height 5' (1.524 m), weight 185.975 kg (410 lb), SpO2 96.00%. She is morbidly obese. She is awake and alert. Her chest is clear. Her heart is  regular. Her abdomen is soft. Extremities showed lymphedema. Cellulitic changes were markedly improved Disposition: Home with home health services      Discharge Orders    Future Appointments: Provider: Department: Dept Phone: Center:   03/10/2012 11:35 AM [facility] [phone] [id number]     Future Orders Please Complete By Expires   Home Health      Questions: Responses:   To provide the following care/treatments PT    RN   Face-to-face encounter      Comments:   I Miranda Garber L certify that this patient is under my care and that I, or a nurse practitioner or physician's assistant working with me, had a face-to-face encounter that meets the physician face-to-face encounter requirements with this patient on 02/04/2012.   Questions: Responses:   The encounter with the patient was in whole, or in part, for the following medical condition, which is the primary reason for home health care Cellulitis   I certify that, based on my findings, the following services are medically necessary home health services Nursing    Physical therapy   My clinical findings support the need for the above services High Risk for rehospitalization   Further, I certify that my clinical findings support that this patient is homebound due to: Can transfer bed to chair only   To provide the following care/treatments PT    RN   Discharge patient           Signed: Fredirick Maudlin Pager [phone]  02/06/2012, 9:33 AM

## 2012-02-25 LAB — BLOOD GAS, ARTERIAL
Bicarbonate: 42 mEq/L — ABNORMAL HIGH (ref 20.0–24.0)
pH, Arterial: 7.107 — CL (ref 7.350–7.400)

## 2012-03-03 ENCOUNTER — Observation Stay (HOSPITAL_COMMUNITY)
Admission: EM | Admit: 2012-03-03 | Discharge: 2012-03-04 | Disposition: A | Discharge status: Home / Self Care | Payer: Medicaid Other | Attending: Pulmonary Disease | Admitting: Pulmonary Disease

## 2012-03-03 ENCOUNTER — Emergency Department (HOSPITAL_COMMUNITY): Payer: Medicaid Other

## 2012-03-03 ENCOUNTER — Encounter (HOSPITAL_COMMUNITY): Payer: Self-pay

## 2012-03-03 DIAGNOSIS — Z950 Presence of cardiac pacemaker: Secondary | ICD-10-CM

## 2012-03-03 DIAGNOSIS — E119 Type 2 diabetes mellitus without complications: Secondary | ICD-10-CM | POA: Insufficient documentation

## 2012-03-03 DIAGNOSIS — I509 Heart failure, unspecified: Secondary | ICD-10-CM | POA: Insufficient documentation

## 2012-03-03 DIAGNOSIS — R079 Chest pain, unspecified: Secondary | ICD-10-CM | POA: Insufficient documentation

## 2012-03-03 DIAGNOSIS — I5032 Chronic diastolic (congestive) heart failure: Secondary | ICD-10-CM

## 2012-03-03 DIAGNOSIS — I498 Other specified cardiac arrhythmias: Principal | ICD-10-CM | POA: Insufficient documentation

## 2012-03-03 DIAGNOSIS — Z6841 Body Mass Index (BMI) 40.0 and over, adult: Secondary | ICD-10-CM | POA: Insufficient documentation

## 2012-03-03 DIAGNOSIS — I5030 Unspecified diastolic (congestive) heart failure: Secondary | ICD-10-CM | POA: Insufficient documentation

## 2012-03-03 DIAGNOSIS — Z23 Encounter for immunization: Secondary | ICD-10-CM | POA: Insufficient documentation

## 2012-03-03 DIAGNOSIS — I10 Essential (primary) hypertension: Secondary | ICD-10-CM | POA: Insufficient documentation

## 2012-03-03 DIAGNOSIS — R0602 Shortness of breath: Secondary | ICD-10-CM | POA: Insufficient documentation

## 2012-03-03 DIAGNOSIS — Z9981 Dependence on supplemental oxygen: Secondary | ICD-10-CM | POA: Insufficient documentation

## 2012-03-03 DIAGNOSIS — I471 Supraventricular tachycardia: Secondary | ICD-10-CM

## 2012-03-03 HISTORY — DX: Bradycardia, unspecified: R00.1

## 2012-03-03 HISTORY — DX: Other ventricular tachycardia: I47.29

## 2012-03-03 HISTORY — DX: Chronic diastolic (congestive) heart failure: I50.32

## 2012-03-03 HISTORY — DX: Supraventricular tachycardia: I47.1

## 2012-03-03 HISTORY — DX: Mixed hyperlipidemia: E78.2

## 2012-03-03 HISTORY — DX: Other specified postprocedural states: Z98.890

## 2012-03-03 HISTORY — DX: Essential (primary) hypertension: I10

## 2012-03-03 HISTORY — DX: Ventricular tachycardia: I47.2

## 2012-03-03 HISTORY — DX: Supraventricular tachycardia, unspecified: I47.10

## 2012-03-03 LAB — CBC WITH DIFFERENTIAL/PLATELET
Basophils Relative: 0 % (ref 0–1)
Eosinophils Absolute: 0.5 10*3/uL (ref 0.0–0.7)
Eosinophils Relative: 6 % — ABNORMAL HIGH (ref 0–5)
HCT: 34.3 % — ABNORMAL LOW (ref 36.0–46.0)
Monocytes Absolute: 0.5 10*3/uL (ref 0.1–1.0)
Monocytes Relative: 5 % (ref 3–12)
Platelets: 304 10*3/uL (ref 150–400)
RBC: 4 MIL/uL (ref 3.87–5.11)
RDW: 16.9 % — ABNORMAL HIGH (ref 11.5–15.5)
WBC: 9.1 10*3/uL (ref 4.0–10.5)

## 2012-03-03 LAB — BASIC METABOLIC PANEL
BUN: 24 mg/dL — ABNORMAL HIGH (ref 6–23)
Calcium: 9.2 mg/dL (ref 8.4–10.5)
Creatinine, Ser: 1.35 mg/dL — ABNORMAL HIGH (ref 0.50–1.10)
GFR calc Af Amer: 50 mL/min — ABNORMAL LOW (ref >90)
GFR calc non Af Amer: 43 mL/min — ABNORMAL LOW (ref >90)
Potassium: 3.9 mEq/L (ref 3.5–5.1)

## 2012-03-03 LAB — MRSA PCR SCREENING: MRSA by PCR: NEGATIVE

## 2012-03-03 MED ORDER — TORSEMIDE 20 MG PO TABS
50.0000 mg | ORAL_TABLET | Freq: Two times a day (BID) | ORAL | Status: DC
  Administered 2012-03-03 (×2): 50 mg via ORAL
  Filled 2012-03-03 (×2): qty 3

## 2012-03-03 MED ORDER — DOXYCYCLINE HYCLATE 100 MG PO TABS
50.0000 mg | ORAL_TABLET | Freq: Two times a day (BID) | ORAL | Status: DC
  Administered 2012-03-03: 50 mg via ORAL
  Administered 2012-03-04: 100 mg via ORAL
  Filled 2012-03-03 (×2): qty 1

## 2012-03-03 MED ORDER — LABETALOL HCL 200 MG PO TABS
300.0000 mg | ORAL_TABLET | Freq: Two times a day (BID) | ORAL | Status: DC
  Administered 2012-03-03: 300 mg via ORAL
  Filled 2012-03-03: qty 2

## 2012-03-03 MED ORDER — DOXYCYCLINE HYCLATE 50 MG PO CAPS
50.0000 mg | ORAL_CAPSULE | Freq: Two times a day (BID) | ORAL | Status: DC
  Filled 2012-03-03 (×4): qty 1

## 2012-03-03 MED ORDER — INFLUENZA VIRUS VACC SPLIT PF IM SUSP
0.5000 mL | INTRAMUSCULAR | Status: AC
  Administered 2012-03-04: 0.5 mL via INTRAMUSCULAR
  Filled 2012-03-03: qty 0.5

## 2012-03-03 MED ORDER — SPIRONOLACTONE 25 MG PO TABS
25.0000 mg | ORAL_TABLET | Freq: Every day | ORAL | Status: DC
  Administered 2012-03-03: 25 mg via ORAL
  Filled 2012-03-03: qty 1

## 2012-03-03 MED ORDER — FERROUS SULFATE 325 (65 FE) MG PO TABS
325.0000 mg | ORAL_TABLET | Freq: Every day | ORAL | Status: DC
  Administered 2012-03-03 – 2012-03-04 (×2): 325 mg via ORAL
  Filled 2012-03-03 (×2): qty 1

## 2012-03-03 MED ORDER — CLONIDINE HCL 0.2 MG PO TABS
0.2000 mg | ORAL_TABLET | Freq: Two times a day (BID) | ORAL | Status: DC
  Administered 2012-03-03 – 2012-03-04 (×3): 0.2 mg via ORAL
  Filled 2012-03-03 (×3): qty 1

## 2012-03-03 MED ORDER — ENOXAPARIN SODIUM 80 MG/0.8ML ~~LOC~~ SOLN
80.0000 mg | SUBCUTANEOUS | Status: DC
  Administered 2012-03-03: 80 mg via SUBCUTANEOUS
  Filled 2012-03-03: qty 0.8

## 2012-03-03 MED ORDER — ALLOPURINOL 300 MG PO TABS
300.0000 mg | ORAL_TABLET | Freq: Every day | ORAL | Status: DC
  Administered 2012-03-03 – 2012-03-04 (×2): 300 mg via ORAL
  Filled 2012-03-03 (×2): qty 1

## 2012-03-03 MED ORDER — ADENOSINE 6 MG/2ML IV SOLN
INTRAVENOUS | Status: AC
  Administered 2012-03-03: 12 mg via INTRAVENOUS
  Filled 2012-03-03: qty 6

## 2012-03-03 MED ORDER — PNEUMOCOCCAL VAC POLYVALENT 25 MCG/0.5ML IJ INJ
0.5000 mL | INJECTION | INTRAMUSCULAR | Status: AC
  Administered 2012-03-04: 0.5 mL via INTRAMUSCULAR
  Filled 2012-03-03: qty 0.5

## 2012-03-03 MED ORDER — DILTIAZEM HCL 30 MG PO TABS
30.0000 mg | ORAL_TABLET | Freq: Four times a day (QID) | ORAL | Status: DC
  Administered 2012-03-03 – 2012-03-04 (×4): 30 mg via ORAL
  Filled 2012-03-03 (×4): qty 1

## 2012-03-03 MED ORDER — HYDRALAZINE HCL 25 MG PO TABS
100.0000 mg | ORAL_TABLET | Freq: Four times a day (QID) | ORAL | Status: DC
  Administered 2012-03-03 – 2012-03-04 (×4): 100 mg via ORAL
  Filled 2012-03-03 (×4): qty 4

## 2012-03-03 MED ORDER — SULFAMETHOXAZOLE-TMP DS 800-160 MG PO TABS
1.0000 | ORAL_TABLET | Freq: Two times a day (BID) | ORAL | Status: DC
  Administered 2012-03-03: 1 via ORAL
  Filled 2012-03-03: qty 1

## 2012-03-03 MED ORDER — CARVEDILOL 12.5 MG PO TABS
25.0000 mg | ORAL_TABLET | Freq: Two times a day (BID) | ORAL | Status: DC
  Administered 2012-03-03 – 2012-03-04 (×2): 25 mg via ORAL
  Filled 2012-03-03 (×2): qty 2

## 2012-03-03 MED ORDER — DILTIAZEM HCL 25 MG/5ML IV SOLN
INTRAVENOUS | Status: AC
  Administered 2012-03-03: 15 mg via INTRAVENOUS
  Filled 2012-03-03: qty 5

## 2012-03-03 NOTE — Progress Notes (Signed)
ANTICOAGULATION CONSULT NOTE - Initial Consult  Pharmacy Consult for Lovenox Indication: VTE prophylaxis  Allergies  Allergen Reactions  . Morphine And Related Hives    Patient Measurements: Height: 5' (152.4 cm) Weight: 382 lb 8 oz (173.5 kg) IBW/kg (Calculated) : 45.5   Vital Signs: Temp: 98.4 F (36.9 C) (10/14 0911) Temp src: Oral (10/14 0911) BP: 158/86 mmHg (10/14 1500) Pulse Rate: 82  (10/14 1500)  Labs:  Basename 03/03/12 0948 03/03/12 0932  HGB -- 10.6*  HCT -- 34.3*  PLT -- 304  APTT -- --  LABPROT -- --  INR -- --  HEPARINUNFRC -- --  CREATININE -- 1.35*  CKTOTAL -- --  CKMB -- --  TROPONINI <0.30 --    Estimated Creatinine Clearance: 71 ml/min (by C-G formula based on Cr of 1.35).  Medical History: Past Medical History  Diagnosis Date  . Morbid obesity   . Hypertension   . CHF (congestive heart failure)     Diastolic;   . Pacemaker   . COPD (chronic obstructive pulmonary disease)   . Oxygen dependent   . Pickwickian syndrome   . Coronary artery disease   . Shortness of breath   . Obesity hypoventilation syndrome   . Arthritis   . Hiatal hernia   . Tachy-brady syndrome     s/p pacemaker 2008  . Cellulitis     Recurrent bilateral LE  . Respiratory failure, acute 12/2009    Sec to diastolic CHF and COPD  . Constipation 04/02/2011  . High cholesterol   . Peripheral vascular disease   . Anginal pain   . Myocardial infarction 1990's  . Chronic bronchitis   . Sleep apnea   . Type II diabetes mellitus   . Gout     Medications:  Scheduled:    . adenosine      . allopurinol  300 mg Oral Daily  . carvedilol  25 mg Oral BID WC  . cloNIDine  0.2 mg Oral BID  . diltiazem      . enoxaparin (LOVENOX) injection  80 mg Subcutaneous Q24H  . ferrous sulfate  325 mg Oral Daily  . hydrALAZINE  100 mg Oral QID  . influenza  inactive virus vaccine  0.5 mL Intramuscular Tomorrow-1000  . labetalol  300 mg Oral BID  . pneumococcal 23 valent  vaccine  0.5 mL Intramuscular Tomorrow-1000  . spironolactone  25 mg Oral Daily  . torsemide  50 mg Oral BID    Assessment: 56yo morbidly obese female admitted c/o chest pain.  Lovenox ordered for VTE prophylaxis.  Pt has good renal fxn.  Estimated Creatinine Clearance: 71 ml/min (by C-G formula based on Cr of 1.35).  Goal of Therapy:  VTE prophylaxis Monitor platelets by anticoagulation protocol: Yes   Plan:  Lovenox 0.5mg /Kg sq q24hrs CBC per protocol  Valrie Hart A 03/03/2012,3:30 PM

## 2012-03-03 NOTE — ED Notes (Signed)
Pt via EMS after being called for chest pain. EMS arrived to find pt in SVT with a rate 174. Vagal maneuvers unsuccessful. IV attempted but unsuccessful.

## 2012-03-03 NOTE — ED Notes (Signed)
MD at bedside. 

## 2012-03-03 NOTE — Progress Notes (Signed)
Cardiology made aware of consult.

## 2012-03-03 NOTE — ED Provider Notes (Addendum)
History   This chart was scribed for No att. providers found by Will Ryan. This patient was seen in room Room/bed info not found and the patient's care was started at 09:08.   CSN: [id number]  Arrival date & time 03/03/12  0900   None     Chief Complaint  Patient presents with  . Chest Pain    (Consider location/radiation/quality/duration/timing/severity/associated sxs/prior treatment) Patient is a 56 y.o. female presenting with chest pain.  Chest Pain The chest pain began less than 1 hour ago. Chest pain occurs constantly. The chest pain is unchanged.    [name] is a 56 y.o. female brought in by ambulance, who presents to the Emergency Department complaining of sudden onset palpitations and chest pressure beginning 1 hour ago with associated dyspnea.  EMS found pt in SVT with rate of 174.  Pt reports beginning an antibiotic regiment 2 weeks ago for a leg infection that has since resolved.  Pt denies fever, neck pain, sore throat, visual disturbance, CP, cough, SOB, abdominal pain, nausea, emesis, diarrhea, urinary symptoms, back pain, HA, weakness, numbness and rash as associated symptoms.  Pt is morbidly obese with a h/o HTN, CHF, COPD, CAD, DM type-II.  Pt denies tobacco and alcohol use.    Past Medical History  Diagnosis Date  . Morbid obesity   . Hypertension   . CHF (congestive heart failure)     Diastolic;   . Pacemaker   . COPD (chronic obstructive pulmonary disease)   . Oxygen dependent   . Pickwickian syndrome   . Coronary artery disease   . Shortness of breath   . Obesity hypoventilation syndrome   . Arthritis   . Hiatal hernia   . Tachy-brady syndrome     s/p pacemaker 2008  . Cellulitis     Recurrent bilateral LE  . Respiratory failure, acute 12/2009    Sec to diastolic CHF and COPD  . Constipation 04/02/2011  . High cholesterol   . Peripheral vascular disease   . Anginal pain   . Myocardial infarction 1990's  . Chronic bronchitis   . Sleep  apnea   . Type II diabetes mellitus   . Gout     Past Surgical History  Procedure Date  . Abdominal hysterectomy 1980's  . Insert / replace / remove pacemaker !~2010    Family History  Problem Relation Age of Onset  . Coronary artery disease Mother     MI age 37, died.  . Hypertension Father   . Osteoarthritis Father   . Hypertension Sister   . Hypertension Brother   . Obesity Father     History  Substance Use Topics  . Smoking status: Former Smoker -- 0.5 packs/day for .1 years    Types: Cigarettes    Quit date: 05/22/1967  . Smokeless tobacco: Never Used  . Alcohol Use: No    OB History    Grav Para Term Preterm Abortions TAB SAB Ect Mult Living   5 5 5       5       Review of Systems  Cardiovascular: Positive for chest pain.   A complete 10 system review of systems was obtained and all systems are negative except as noted in the HPI and PMH.   Allergies  Morphine and related  Home Medications   Current Outpatient Rx  Name Route Sig Dispense Refill  . ALBUTEROL SULFATE (2.5 MG/3ML) 0.083% IN NEBU Nebulization Take 2.5 mg by nebulization every 6 (six) hours  as needed. For shortness of breath    . ALLOPURINOL 300 MG PO TABS Oral Take 300 mg by mouth daily.    Marland Kitchen CARVEDILOL 25 MG PO TABS Oral Take 1 tablet (25 mg total) by mouth 2 (two) times daily with a meal. 180 tablet 3  . CLONIDINE HCL 0.2 MG PO TABS Oral Take 0.2 mg by mouth 2 (two) times daily.    Marland Kitchen FERROUS SULFATE 325 (65 FE) MG PO TABS Oral Take 325 mg by mouth daily.      Marland Kitchen HYDRALAZINE HCL 100 MG PO TABS Oral Take 100 mg by mouth 4 (four) times daily.    . INSULIN LISPRO PROT & LISPRO (75-25) 100 UNIT/ML [place] SUSP Subcutaneous Inject 20 Units into the skin 3 (three) times daily.     Marland Kitchen LABETALOL HCL 300 MG PO TABS Oral Take 300 mg by mouth 2 (two) times daily.    Carma Leaven M PLUS PO TABS Oral Take 1 tablet by mouth daily.      . TRIPLE ANTIBIOTIC [phone] EX OINT  APPLY TO YOUR LEGS ON EVERY Tuesday,  Thursday, AND Saturday. 28.3 g 0  . NYSTATIN 100000 UNIT/GM EX CREA  APPLY TO YOUR SKIN FOLDS AND LEGS EVERY Monday, Wednesday , AND Friday. 30 g 1  . SPIRONOLACTONE 25 MG PO TABS Oral Take 25 mg by mouth daily.    . TORSEMIDE 100 MG PO TABS Oral Take 50 mg by mouth 2 (two) times daily.    Marland Kitchen VITAMIN C 500 MG PO TABS Oral Take 500 mg by mouth daily.      BP 144/101  Pulse 180  Resp 20  Ht 5' (1.524 m)  Wt 410 lb (185.975 kg)  BMI 80.07 kg/m2  SpO2 96%  Physical Exam  Nursing note and vitals reviewed. Constitutional: She is oriented to person, place, and time. She appears well-developed and well-nourished. She appears distressed.       Morbidly obese  HENT:  Head: Normocephalic and atraumatic.  Eyes: EOM are normal.  Neck: Normal range of motion. No tracheal deviation present.  Cardiovascular: Regular rhythm.   No murmur heard.      Regular tachycardic rhythm.    Pulmonary/Chest: Effort normal and breath sounds normal. She has no wheezes.       Well-healed scar on left-side chest where pacemaker was installed.  Abdominal: Soft. There is no tenderness.  Musculoskeletal: She exhibits no edema and no tenderness.       Multiple skin folds with mild amount of erythema in skin fold on the left leg  Neurological: She is alert and oriented to person, place, and time.  Skin: Skin is warm.  Psychiatric: She has a normal mood and affect.    ED Course  Procedures (including critical care time) DIAGNOSTIC STUDIES: Oxygen Saturation is 96% on [place], adequate by my interpretation.    COORDINATION OF CARE:    Labs Reviewed  CBC WITH DIFFERENTIAL - Abnormal; Notable for the following:    Hemoglobin 10.6 (*)     HCT 34.3 (*)     RDW 16.9 (*)     Eosinophils Relative 6 (*)     All other components within normal limits  BASIC METABOLIC PANEL - Abnormal; Notable for the following:    Glucose, Bld 164 (*)     BUN 24 (*)     Creatinine, Ser 1.35 (*)     GFR calc non Af Amer 43 (*)      GFR calc Af  Amer 50 (*)     All other components within normal limits  TROPONIN I   Dg Chest Port 1 View  03/03/2012  *RADIOLOGY REPORT*  Clinical Data: Chest pressure.  SVT.  PORTABLE CHEST - 1 VIEW  Comparison: 11/12/2011  Findings: The patient has a left-sided transvenous pacemaker, leads overlying the right atrium and right ventricle.  The heart is enlarged.  There is pulmonary vascular congestion but no overt edema.  IMPRESSION: Cardiomegaly and vascular congestion.   Original Report Authenticated By: Patterson Hammersmith, M.D.     Date: 03/03/2012  Rate: 180  Rhythm: supraventricular tachycardia (SVT)  QRS Axis: normal  Intervals: normal  ST/T Wave abnormalities: nonspecific ST/T changes  Conduction Disutrbances:none  Narrative Interpretation:   Old EKG Reviewed: changes noted   Date: 03/03/2012 - 2nd ekg after medical cardioversion  Rate: 103  Rhythm: Atrial paced rhythm  QRS Axis: indeterminate  Intervals: normal  ST/T Wave abnormalities: nonspecific ST changes  Conduction Disutrbances:Atrial paced rhythm  Narrative Interpretation:   Old EKG Reviewed: changes noted   CRITICAL CARE Performed by: Gwyneth Sprout   Total critical care time: 30  Critical care time was exclusive of separately billable procedures and treating other patients.  Critical care was necessary to treat or prevent imminent or life-threatening deterioration.  Critical care was time spent personally by me on the following activities: development of treatment plan with patient and/or surrogate as well as nursing, discussions with consultants, evaluation of patient's response to treatment, examination of patient, obtaining history from patient or surrogate, ordering and performing treatments and interventions, ordering and review of laboratory studies, ordering and review of radiographic studies, pulse oximetry and re-evaluation of patient's condition.    1. SVT (supraventricular tachycardia)        MDM   Patient presented in SVT. She was complaining of palpitations and chest pressure. She woke up this morning. Patient also complaining of mild shortness of breath. No recent medication changes except for antibiotic which she had been on for 2 weeks for leg infection which she states is now improved. On exam patient's heart rate was 180 and regular and appeared to be SVT. Patient received 2 rounds of 12 mg of adenosine with improvement for a short period of time for 2-3 beats of sinus and then returned back to SVT. It was then decided to give patient a Cardizem bolus of 20. This afternoon this evening with 20 mg bolus she converted to sinus rhythm. She does have a pacemaker and is now currently being paced at a rate 60-70. Patient still complaining of some mild chest tenderness but repeat EKG shows no signs of ST elevation or concern for MI at this time. Chest x-ray showed cardiomegaly and vascular congestion which is most likely from the recent abnormal rhythm. CBC, BMP, troponin pending.  11:03 AM Initial labs within normal limits. Spoke with Dr. Juanetta Gosling and will admit patient for observation overnight   I personally performed the services described in this documentation, which was scribed in my presence.  The recorded information has been reviewed and considered.       Gwyneth Sprout, MD 03/03/12 1000  Gwyneth Sprout, MD 03/03/12 1103  Gwyneth Sprout, MD 03/03/12 1105

## 2012-03-03 NOTE — H&P (Signed)
[name] MRN: [id number] DOB/AGE: 1956-04-19 56 y.o. Primary Care Physician:Rashaun Wichert L, MD Admit date: 03/03/2012 Chief Complaint: Tachycardia HPI: This is a 56 year old who came to the emergency room because of supraventricular tachycardia. She said it happened suddenly without any obvious cause this morning. It lasted about 45 minutes. She did not improve with adenosine but did with Cardizem bolus. She has a history of a pacemaker. She is also concerned that she may have cellulitis again  Past Medical History  Diagnosis Date  . Morbid obesity   . Essential hypertension, benign   . Chronic diastolic heart failure     LVEF 60-65% 2011  . COPD (chronic obstructive pulmonary disease)     Oxygen dependent  . History of cardiac catheterization     Normal coronary arteries 2008  . Obesity hypoventilation syndrome   . Arthritis   . Hiatal hernia   . Symptomatic bradycardia     With pauses 2008 - Followed by Dr. Graciela Husbands  . Cellulitis     Recurrent bilateral LE  . Mixed hyperlipidemia   . Peripheral vascular disease   . Myocardial infarction 1990's  . Sleep apnea   . Type II diabetes mellitus   . Gout   . Nonsustained ventricular tachycardia     Documented 2008  . SVT (supraventricular tachycardia)     Reported possible atrial arrhythmias   Past Surgical History  Procedure Date  . Abdominal hysterectomy 1980's  . Insert / replace / remove pacemaker 2010        Family History  Problem Relation Age of Onset  . Coronary artery disease Mother     MI age 69, died.  . Hypertension Father   . Osteoarthritis Father   . Hypertension Sister   . Hypertension Brother   . Obesity Father     Social History:  reports that she quit smoking about 44 years ago. Her smoking use included Cigarettes. She has a .05 pack-year smoking history. She has never used smokeless tobacco. She reports that she does not drink alcohol or use illicit drugs.   Allergies:  Allergies    Allergen Reactions  . Morphine And Related Hives    Medications Prior to Admission  Medication Sig Dispense Refill  . albuterol (PROVENTIL) (2.5 MG/3ML) 0.083% nebulizer solution Take 2.5 mg by nebulization every 6 (six) hours as needed. For shortness of breath      . allopurinol (ZYLOPRIM) 300 MG tablet Take 300 mg by mouth daily.      . carvedilol (COREG) 25 MG tablet Take 1 tablet (25 mg total) by mouth 2 (two) times daily with a meal.  180 tablet  3  . cloNIDine (CATAPRES) 0.2 MG tablet Take 0.2 mg by mouth 2 (two) times daily.      . ferrous sulfate 325 (65 FE) MG tablet Take 325 mg by mouth daily.        . hydrALAZINE (APRESOLINE) 100 MG tablet Take 100 mg by mouth 4 (four) times daily.      . insulin lispro protamine-insulin lispro (HUMALOG 75/25) (75-25) 100 UNIT/ML SUSP Inject 20 Units into the skin 3 (three) times daily.       Marland Kitchen labetalol (NORMODYNE) 300 MG tablet Take 300 mg by mouth 2 (two) times daily.      . Multiple Vitamins-Minerals (MULTIVITAMINS THER. W/MINERALS) TABS Take 1 tablet by mouth daily.        Marland Kitchen neomycin-bacitracin-polymyxin (NEOSPORIN) [phone] ointment APPLY TO YOUR LEGS ON EVERY Tuesday, Thursday, AND Saturday.  28.3 g  0  . nystatin (MYCOSTATIN) cream APPLY TO YOUR SKIN FOLDS AND LEGS EVERY Monday, Wednesday , AND Friday.  30 g  1  . spironolactone (ALDACTONE) 25 MG tablet Take 25 mg by mouth daily.      Marland Kitchen torsemide (DEMADEX) 100 MG tablet Take 50 mg by mouth 2 (two) times daily.      . vitamin C (ASCORBIC ACID) 500 MG tablet Take 500 mg by mouth daily.           EAV:WUJWJ from the symptoms mentioned above,there are no other symptoms referable to all systems reviewed.  Physical Exam: Blood pressure 142/78, pulse 74, temperature 98.3 F (36.8 C), temperature source Oral, resp. rate 16, height 5' (1.524 m), weight 173.5 kg (382 lb 8 oz), SpO2 94.00%. She is awake and alert. She is morbidly obese. Her heart rate is now 74. Her pupils are reactive. Her  nose and throat are clear. Her neck is supple. Her chest is clear. Her heart is regular now. Her abdomen is soft. Extremities showed perhaps some erythema and mild warmth in the left thigh. Central nervous system exam is grossly intact    Basename 03/03/12 0932  WBC 9.1  NEUTROABS 6.9  HGB 10.6*  HCT 34.3*  MCV 85.8  PLT 304    Basename 03/03/12 0932  NA 140  K 3.9  CL 97  CO2 32  GLUCOSE 164*  BUN 24*  CREATININE 1.35*  CALCIUM 9.2  MG --  lablast2(ast:2,ALT:2,alkphos:2,bilitot:2,prot:2,albumin:2)@    No results found for this or any previous visit (from the past 240 hour(s)).   Dg Chest Port 1 View  03/03/2012  *RADIOLOGY REPORT*  Clinical Data: Chest pressure.  SVT.  PORTABLE CHEST - 1 VIEW  Comparison: 11/12/2011  Findings: The patient has a left-sided transvenous pacemaker, leads overlying the right atrium and right ventricle.  The heart is enlarged.  There is pulmonary vascular congestion but no overt edema.  IMPRESSION: Cardiomegaly and vascular congestion.   Original Report Authenticated By: Patterson Hammersmith, M.D.    Impression: She has supraventricular tachycardia. She has morbid obesity. She has sleep apnea and obesity hypoventilation. She has recurrent cellulitis of the legs Active Problems:  * No active hospital problems. *      Plan: She will have cardiology consultation. I will start her on medications for cellulitis.      Denton Derks L Pager [phone]  03/03/2012, 6:09 PM

## 2012-03-03 NOTE — Consult Note (Signed)
Primary cardiologist: Dr. Brentwood Bing Electrophysiologist: Dr. Sherryl Manges  Clinical Summary [name] is a 56 y.o.female with medical history outlined below, currently admitted to the hospital for further observation following presentation with sustained supraventricular tachycardia documented by telemetry and ECG. She reports that she noticed the sudden onset of rapid heart rate earlier this morning associated with chest discomfort, symptoms lasted for approximately 45 minutes. She was treated with adenosine and ultimately Cardizem bolus 15 mg with subsequent restoration of sinus rhythm.  Last cardiology follow up with Dr. Dietrich Pates was in 2011. She has had transtelephonic evaluation of her pacemaker, most recently in July showing normal device function with 12 mode switches and ventricular high rate episodes, the longest lasting 14 beats. She reports no regular sense of prolonged palpitations, has had no syncope.  She reports compliance with her medications, has been on 2 beta blockers in the form of Coreg and labetalol at home most recently. Record review finds that she was at one point on flecainide, uncertain indication although possibly atrial arrhythmia.  Last echocardiogram in 2011 revealed an LVEF 60-65% with mild to moderate LVH, mild aortic regurgitation.   Allergies  Allergen Reactions  . Morphine And Related Hives    Medications    . adenosine      . allopurinol  300 mg Oral Daily  . carvedilol  25 mg Oral BID WC  . cloNIDine  0.2 mg Oral BID  . diltiazem      . enoxaparin (LOVENOX) injection  80 mg Subcutaneous Q24H  . ferrous sulfate  325 mg Oral Daily  . hydrALAZINE  100 mg Oral QID  . influenza  inactive virus vaccine  0.5 mL Intramuscular Tomorrow-1000  . labetalol  300 mg Oral BID  . pneumococcal 23 valent vaccine  0.5 mL Intramuscular Tomorrow-1000  . spironolactone  25 mg Oral Daily  . torsemide  50 mg Oral BID    Past Medical History  Diagnosis  Date  . Morbid obesity   . Essential hypertension, benign   . Chronic diastolic heart failure     LVEF 60-65% 2011  . COPD (chronic obstructive pulmonary disease)     Oxygen dependent  . History of cardiac catheterization     Normal coronary arteries 2008  . Obesity hypoventilation syndrome   . Arthritis   . Hiatal hernia   . Symptomatic bradycardia     With pauses 2008 - Followed by Dr. Graciela Husbands  . Cellulitis     Recurrent bilateral LE  . Mixed hyperlipidemia   . Peripheral vascular disease   . Myocardial infarction 1990's  . Sleep apnea   . Type II diabetes mellitus   . Gout   . Nonsustained ventricular tachycardia     Documented 2008  . SVT (supraventricular tachycardia)     Reported possible atrial arrhythmias    Past Surgical History  Procedure Date  . Abdominal hysterectomy 1980's  . Insert / replace / remove pacemaker 2010    Family History  Problem Relation Age of Onset  . Coronary artery disease Mother     MI age 75, died.  . Hypertension Father   . Osteoarthritis Father   . Hypertension Sister   . Hypertension Brother   . Obesity Father     Social History [name] reports that she quit smoking about 44 years ago. Her smoking use included Cigarettes. She has a .05 pack-year smoking history. She has never used smokeless tobacco. [name] reports that she does not drink  alcohol.  Review of Systems Functionally limited. Stable appetite, no reported bleeding problems. No unusual dizziness or syncope. No fevers or chills. Recent leg cellulitis with hospital admission back in September. Otherwise negative  Physical Examination Blood pressure 135/73, pulse 71, temperature 98.2 F (36.8 C), temperature source Oral, resp. rate 20, height 5' (1.524 m), weight 382 lb 8 oz (173.5 kg), SpO2 97.00%.  Morbidly obese woman in no acute distress. HEENT: Conjunctiva and lids normal, oropharynx clear. Neck: Supple, increased girth, no obvious carotid bruits, no  thyromegaly. Lungs: Clear to auscultation, nonlabored breathing at rest. Cardiac: Regular rate and rhythm, no S3 or significant systolic murmur, no pericardial rub. Abdomen: Soft, obese, bowel sounds present, no guarding or rebound. Extremities: Chronic appearing edema, distal pulses 1-2+. Skin: Warm and dry. Musculoskeletal: No kyphosis. Neuropsychiatric: Alert and oriented x3, affect grossly appropriate.   Lab Results Basic Metabolic Panel:  Lab 03/03/12 8657  NA 140  K 3.9  CL 97  CO2 32  GLUCOSE 164*  BUN 24*  CREATININE 1.35*  CALCIUM 9.2  MG --  PHOS --   CBC:  Lab 03/03/12 0932  WBC 9.1  NEUTROABS 6.9  HGB 10.6*  HCT 34.3*  MCV 85.8  PLT 304   Cardiac Enzymes:  Lab 03/03/12 0948  CKTOTAL --  CKMB --  CKMBINDEX --  TROPONINI <0.30   CBG:  Lab 03/03/12 1620  GLUCAP 110*    Impression  1. Presentation with sustained supraventricular tachycardia, presently resolved after treatment with adenosine and ultimately intravenous Cardizem bolus. Chest pain symptoms have resolved and initial cardiac markers are normal. She has history of normal coronary arteries at cardiac catheterization in 2008.  2. Previous history of documented nonsustained ventricular tachycardia.  3. History of chronic diastolic heart failure, last LVEF 60-65% in 2011.  4. History of symptomatic bradycardia and pauses status post pacemaker placement in 2008, followed by Dr. Graciela Husbands. She has a [employer] device.   Recommendations  Plan to continue Coreg, discontinue labetalol, and start Cardizem beginning at 30 mg by mouth every 6 hours. It may be that a combination of beta blocker and calcium channel blocker might provide better suppression of SVT. Continue to cycle cardiac markers and follow up with ECG in the morning. Echocardiogram will also be obtained for reassessment of cardiac structure and function. Our service will follow with you.  Jonelle Sidle, M.D., F.A.C.C.

## 2012-03-03 NOTE — ED Notes (Signed)
Family at bedside. 

## 2012-03-04 DIAGNOSIS — I471 Supraventricular tachycardia: Secondary | ICD-10-CM

## 2012-03-04 DIAGNOSIS — Z950 Presence of cardiac pacemaker: Secondary | ICD-10-CM

## 2012-03-04 DIAGNOSIS — I498 Other specified cardiac arrhythmias: Secondary | ICD-10-CM

## 2012-03-04 DIAGNOSIS — I5032 Chronic diastolic (congestive) heart failure: Secondary | ICD-10-CM

## 2012-03-04 LAB — GLUCOSE, CAPILLARY: Glucose-Capillary: 131 mg/dL — ABNORMAL HIGH (ref 70–99)

## 2012-03-04 MED ORDER — SULFAMETHOXAZOLE-TMP DS 800-160 MG PO TABS: 1.0000 | ORAL_TABLET | Freq: Two times a day (BID) | ORAL | Status: DC

## 2012-03-04 MED ORDER — DILTIAZEM HCL 30 MG PO TABS: 30.0000 mg | ORAL_TABLET | Freq: Four times a day (QID) | ORAL | Status: DC

## 2012-03-04 MED ORDER — INSULIN ASPART 100 UNIT/ML ~~LOC~~ SOLN
0.0000 [IU] | Freq: Three times a day (TID) | SUBCUTANEOUS | Status: DC
  Administered 2012-03-04: 4 [IU] via SUBCUTANEOUS
  Administered 2012-03-04: 3 [IU] via SUBCUTANEOUS

## 2012-03-04 MED ORDER — INSULIN ASPART 100 UNIT/ML ~~LOC~~ SOLN: 0.0000 [IU] | Freq: Every day | SUBCUTANEOUS | Status: DC

## 2012-03-04 MED ORDER — HYDROCODONE-ACETAMINOPHEN 5-325 MG PO TABS
1.0000 | ORAL_TABLET | ORAL | Status: DC | PRN
  Administered 2012-03-04: 2 via ORAL
  Filled 2012-03-04: qty 2

## 2012-03-04 MED ORDER — DOXYCYCLINE HYCLATE 100 MG PO TABS: 100.0000 mg | ORAL_TABLET | Freq: Two times a day (BID) | ORAL | Status: DC

## 2012-03-04 NOTE — Clinical Social Work Note (Signed)
CSW arranged EMS transport for patient to return home at discharge.  EMS will transport approx 2 PM today.  Santa Genera, LCSW Clinical Social Worker [phone])

## 2012-03-04 NOTE — Care Management Note (Signed)
    Page 1 of 1   03/04/2012     1:34:53 PM   CARE MANAGEMENT NOTE 03/04/2012  Patient:  [name], [name]   Account Number:  [id number]  Date Initiated:  03/04/2012  Documentation initiated by:  Sharrie Rothman  Subjective/Objective Assessment:   Pt admitted from home with SVT. Pt lives with family and will return home at discharge. Pt is bedbound and has a hospital bed and walker for home use.Pt has been active with [facility] until about a week ago.     Action/Plan:   CSW to arrange [facility] for transportation home. [facility] RN arranged and Alroy Bailiff of [facility] is aware and will collect the pts information from the chart   Anticipated DC Date:  03/04/2012   Anticipated DC Plan:  HOME W HOME HEALTH SERVICES  In-house referral  Clinical Social Worker      DC Planning Services  CM consult      [facility] Choice  HOME HEALTH   Choice offered to / List presented to:  C-1 Patient        HH arranged  HH-1 RN      [facility] agency  [facility]   Status of service:  Completed, signed off Medicare Important Message given?   (If response is "NO", the following Medicare IM given date fields will be blank) Date Medicare IM given:   Date Additional Medicare IM given:    Discharge Disposition:  HOME W HOME HEALTH SERVICES  Per UR Regulation:    If discussed at Long Length of Stay Meetings, dates discussed:    Comments:  03/04/12 1334 [name] Queen, RN BSN CM

## 2012-03-04 NOTE — Progress Notes (Signed)
Pt transferred home via EMS at 1430. Family aware of discharge.

## 2012-03-04 NOTE — Progress Notes (Signed)
UR Chart Review Completed  

## 2012-03-04 NOTE — Progress Notes (Signed)
*  PRELIMINARY RESULTS* Echocardiogram 2D Echocardiogram has been performed.  [name] 03/04/2012, 11:51 AM

## 2012-03-04 NOTE — Progress Notes (Signed)
   Primary cardiologist: Dr. [place] Bing  Subjective:   No chest pain or palpitations. No breathlessness.   Objective:   Temp:  [98 F (36.7 C)-98.8 F (37.1 C)] 98.5 F (36.9 C) (10/15 0800) Pulse Rate:  [66-93] 72  (10/15 0800) Resp:  [14-26] 19  (10/15 0800) BP: (116-158)/(56-130) 118/65 mmHg (10/15 0800) SpO2:  [94 %-100 %] 99 % (10/15 0800) FiO2 (%):  [32 %] 32 % (10/14 2019) Weight:  [381 lb 2.8 oz (172.9 kg)-382 lb 8 oz (173.5 kg)] 381 lb 2.8 oz (172.9 kg) (10/15 0500)    Filed Weights   03/03/12 0902 03/03/12 1300 03/04/12 0500  Weight: 410 lb (185.975 kg) 382 lb 8 oz (173.5 kg) 381 lb 2.8 oz (172.9 kg)    Intake/Output Summary (Last 24 hours) at 03/04/12 0939 Last data filed at 03/04/12 0500  Gross per 24 hour  Intake   1210 ml  Output   1250 ml  Net    -40 ml   Telemetry: Sinus rhythm, no SVT.  Exam:  General: Morbidly obese, no acute distress.  Lungs: Diminished, nonlabored.  Cardiac: Regular rate and rhythm, no gallop.  Lab Results:  Basic Metabolic Panel:  Lab 03/03/12 2130  NA 140  K 3.9  CL 97  CO2 32  GLUCOSE 164*  BUN 24*  CREATININE 1.35*  CALCIUM 9.2  MG --    CBC:  Lab 03/03/12 0932  WBC 9.1  HGB 10.6*  HCT 34.3*  MCV 85.8  PLT 304    Cardiac Enzymes:  Lab 03/03/12 0948  CKTOTAL --  CKMB --  CKMBINDEX --  TROPONINI <0.30   ECG: Sinus rhythm with no acute ST segment changes.   Medications:   Scheduled Medications:    . allopurinol  300 mg Oral Daily  . carvedilol  25 mg Oral BID WC  . cloNIDine  0.2 mg Oral BID  . diltiazem  30 mg Oral Q6H  . doxycycline  50 mg Oral Q12H  . enoxaparin (LOVENOX) injection  80 mg Subcutaneous Q24H  . ferrous sulfate  325 mg Oral Daily  . hydrALAZINE  100 mg Oral QID  . influenza  inactive virus vaccine  0.5 mL Intramuscular Tomorrow-1000  . insulin aspart  0-20 Units Subcutaneous TID WC  . insulin aspart  0-5 Units Subcutaneous QHS  . pneumococcal 23 valent vaccine  0.5  mL Intramuscular Tomorrow-1000  . spironolactone  25 mg Oral Daily  . sulfamethoxazole-trimethoprim  1 tablet Oral Q12H  . torsemide  50 mg Oral BID  . DISCONTD: doxycycline  50 mg Oral Q12H  . DISCONTD: labetalol  300 mg Oral BID    Assessment:   1. Status post episode of sustained SVT, now maintaining normal sinus rhythm. Cardiac markers argue against acute coronary syndrome, and she has a history of normal coronary arteries at catheterization in 2008. Now on a combination of Coreg and diltiazem.  2. History of symptomatic bradycardia and pauses status post [employer] pacemaker placement in 2008.  3. History of chronic diastolic heart failure, LVEF 60-65% in 2011. Followup echocardiogram is pending.   Plan/Discussion:    If her followup echocardiogram is overall stable, would agree that she could be discharged home today on combination of Coreg at current dose and diltiazem CD 120 mg daily. Would discontinue labetalol in the meanwhile. Followup can be made with Dr. Dietrich Pates or nurse practitioner over the next few weeks.   Jonelle Sidle, M.D., F.A.C.C.

## 2012-03-04 NOTE — Progress Notes (Signed)
Nutrition Brief Note  Patient identified on the Malnutrition Screening Tool (MST) Report  Body mass index is 74.44 kg/(m^2). Pt meets criteria for Obesity Class III based on current BMI.   Current diet order is CHO Modified (1600-2000 kcal) , patient is consuming approximately 100% of meals at this time. Labs and medications reviewed.   No nutrition interventions warranted at this time. If nutrition issues arise, please consult RD.   [phone]

## 2012-03-04 NOTE — Progress Notes (Signed)
Subjective: She is awake and alert. She has no complaints. She has not had any further episodes of tachycardia. Cardiology consult is noted and appreciated.  Objective: Vital signs in last 24 hours: Temp:  [98 F (36.7 C)-98.8 F (37.1 C)] 98.5 F (36.9 C) (10/15 0800) Pulse Rate:  [66-180] 78  (10/15 0744) Resp:  [14-28] 26  (10/15 0744) BP: (116-158)/(56-130) 128/62 mmHg (10/15 0744) SpO2:  [94 %-100 %] 100 % (10/15 0744) FiO2 (%):  [32 %] 32 % (10/14 2019) Weight:  [172.9 kg (381 lb 2.8 oz)-185.975 kg (410 lb)] 172.9 kg (381 lb 2.8 oz) (10/15 0500) Weight change:     Intake/Output from previous day: 10/14 0701 - 10/15 0700 In: 1210 [P.O.:1210] Out: 1250 [Urine:1250]  PHYSICAL EXAM General appearance: alert, cooperative, no distress and morbidly obese Resp: clear to auscultation bilaterally Cardio: regular rate and rhythm, S1, S2 normal, no murmur, click, rub or gallop GI: soft, non-tender; bowel sounds normal; no masses,  no organomegaly Extremities: Less inflammation of her leg  Lab Results:    Basic Metabolic Panel:  Basename 03/03/12 0932  NA 140  K 3.9  CL 97  CO2 32  GLUCOSE 164*  BUN 24*  CREATININE 1.35*  CALCIUM 9.2  MG --  PHOS --   Liver Function Tests: No results found for this basename: AST:2,ALT:2,ALKPHOS:2,BILITOT:2,PROT:2,ALBUMIN:2 in the last 72 hours No results found for this basename: LIPASE:2,AMYLASE:2 in the last 72 hours No results found for this basename: AMMONIA:2 in the last 72 hours CBC:  Basename 03/03/12 0932  WBC 9.1  NEUTROABS 6.9  HGB 10.6*  HCT 34.3*  MCV 85.8  PLT 304   Cardiac Enzymes:  Basename 03/03/12 0948  CKTOTAL --  CKMB --  CKMBINDEX --  TROPONINI <0.30   BNP: No results found for this basename: PROBNP:3 in the last 72 hours D-Dimer: No results found for this basename: DDIMER:2 in the last 72 hours CBG:  Basename 03/04/12 0716 03/03/12 2117 03/03/12 1620  GLUCAP 154* 178* 110*   Hemoglobin  A1C: No results found for this basename: HGBA1C in the last 72 hours Fasting Lipid Panel: No results found for this basename: CHOL,HDL,LDLCALC,TRIG,CHOLHDL,LDLDIRECT in the last 72 hours Thyroid Function Tests: No results found for this basename: TSH,T4TOTAL,FREET4,T3FREE,THYROIDAB in the last 72 hours Anemia Panel: No results found for this basename: VITAMINB12,FOLATE,FERRITIN,TIBC,IRON,RETICCTPCT in the last 72 hours Coagulation: No results found for this basename: LABPROT:2,INR:2 in the last 72 hours Urine Drug Screen: Drugs of Abuse     Component Value Date/Time   LABOPIA NEGATIVE 02/25/2009 1755   LABOPIA NONE DETECTED 06/05/2007 1610   COCAINSCRNUR NEGATIVE 02/25/2009 1755   COCAINSCRNUR NONE DETECTED 06/05/2007 1610   LABBENZ NEGATIVE 02/25/2009 1755   LABBENZ NONE DETECTED 06/05/2007 1610   AMPHETMU NEGATIVE 02/25/2009 1755   AMPHETMU NONE DETECTED 06/05/2007 1610   THCU NONE DETECTED 06/05/2007 1610   LABBARB  Value: NONE DETECTED        DRUG SCREEN FOR MEDICAL PURPOSES ONLY.  IF CONFIRMATION IS NEEDED FOR ANY PURPOSE, NOTIFY LAB WITHIN 5 DAYS. 06/05/2007 1610    Alcohol Level: No results found for this basename: ETH:2 in the last 72 hours Urinalysis: No results found for this basename: COLORURINE:2,APPERANCEUR:2,LABSPEC:2,PHURINE:2,GLUCOSEU:2,HGBUR:2,BILIRUBINUR:2,KETONESUR:2,PROTEINUR:2,UROBILINOGEN:2,NITRITE:2,LEUKOCYTESUR:2 in the last 72 hours Misc. Labs:  ABGS No results found for this basename: PHART,PCO2,PO2ART,TCO2,HCO3 in the last 72 hours CULTURES Recent Results (from the past 240 hour(s))  MRSA PCR SCREENING     Status: Normal   Collection Time   03/03/12  2:19 PM  Component Value Range Status Comment   MRSA by PCR NEGATIVE  NEGATIVE Final    Studies/Results: Dg Chest Port 1 View  03/03/2012  *RADIOLOGY REPORT*  Clinical Data: Chest pressure.  SVT.  PORTABLE CHEST - 1 VIEW  Comparison: 11/12/2011  Findings: The patient has a left-sided transvenous pacemaker,  leads overlying the right atrium and right ventricle.  The heart is enlarged.  There is pulmonary vascular congestion but no overt edema.  IMPRESSION: Cardiomegaly and vascular congestion.   Original Report Authenticated By: Patterson Hammersmith, M.D.     Medications:  Scheduled:   . adenosine      . allopurinol  300 mg Oral Daily  . carvedilol  25 mg Oral BID WC  . cloNIDine  0.2 mg Oral BID  . diltiazem      . diltiazem  30 mg Oral Q6H  . doxycycline  50 mg Oral Q12H  . enoxaparin (LOVENOX) injection  80 mg Subcutaneous Q24H  . ferrous sulfate  325 mg Oral Daily  . hydrALAZINE  100 mg Oral QID  . influenza  inactive virus vaccine  0.5 mL Intramuscular Tomorrow-1000  . insulin aspart  0-20 Units Subcutaneous TID WC  . insulin aspart  0-5 Units Subcutaneous QHS  . pneumococcal 23 valent vaccine  0.5 mL Intramuscular Tomorrow-1000  . spironolactone  25 mg Oral Daily  . sulfamethoxazole-trimethoprim  1 tablet Oral Q12H  . torsemide  50 mg Oral BID  . DISCONTD: doxycycline  50 mg Oral Q12H  . DISCONTD: labetalol  300 mg Oral BID   Continuous:  PRN:  Assesment: She was admitted with supraventricular tachycardia. She responded to Cardizem IV and is now on Cardizem by mouth. She may have had some cellulitis but that seems better with oral antibiotics. She has a pacemaker so we don't have to worry too much about bradycardia. Active Problems:  * No active hospital problems. *     Plan: I will discuss with the cardiology consultations. I think there is some possibility that she could go home.    LOS: 1 day   Nayleah Gamel L 03/04/2012, 8:10 AM

## 2012-03-04 NOTE — Progress Notes (Signed)
Discharge instructions gone over with pt using teach back method. All questions and concerns answered. Pt waiting on EMS for transfer home. Will continue to monitor.

## 2012-03-05 NOTE — Discharge Summary (Signed)
Physician Discharge Summary  Patient ID: [name] MRN: [id number] DOB/AGE: [birth year] 56 y.o. Primary Care Physician:Shontell Prosser L, MD Admit date: 03/03/2012 Discharge date: 03/05/2012    Discharge Diagnoses:   Active Problems:  SVT (supraventricular tachycardia)  hypertension Morbid obesity Gout Recurrent cellulitis of the leg History of cardiac bradycardia arrhythmias requiring pacemaker implantation Obesity hypoventilation Diabetes Diastolic congestive heart failure   Medication List     As of 03/05/2012  8:40 AM    STOP taking these medications         labetalol 300 MG tablet   Commonly known as: NORMODYNE      TAKE these medications         albuterol (2.5 MG/3ML) 0.083% nebulizer solution   Commonly known as: PROVENTIL   Take 2.5 mg by nebulization every 6 (six) hours as needed. For shortness of breath      allopurinol 300 MG tablet   Commonly known as: ZYLOPRIM   Take 300 mg by mouth daily.      carvedilol 25 MG tablet   Commonly known as: COREG   Take 1 tablet (25 mg total) by mouth 2 (two) times daily with a meal.      cloNIDine 0.2 MG tablet   Commonly known as: CATAPRES   Take 0.2 mg by mouth 2 (two) times daily.      diltiazem 30 MG tablet   Commonly known as: CARDIZEM   Take 1 tablet (30 mg total) by mouth every 6 (six) hours.      doxycycline 100 MG tablet   Commonly known as: VIBRA-TABS   Take 1 tablet (100 mg total) by mouth 2 (two) times daily.      ferrous sulfate 325 (65 FE) MG tablet   Take 325 mg by mouth daily.      hydrALAZINE 100 MG tablet   Commonly known as: APRESOLINE   Take 100 mg by mouth 4 (four) times daily.      insulin lispro protamine-insulin lispro (75-25) 100 UNIT/ML Susp   Commonly known as: HUMALOG 75/25   Inject 20 Units into the skin 3 (three) times daily.      multivitamins ther. w/minerals Tabs   Take 1 tablet by mouth daily.      neomycin-bacitracin-polymyxin [phone] ointment   APPLY TO  YOUR LEGS ON EVERY Tuesday, Thursday, AND Saturday.      nystatin cream   Commonly known as: MYCOSTATIN   APPLY TO YOUR SKIN FOLDS AND LEGS EVERY Monday, Wednesday , AND Friday.      spironolactone 25 MG tablet   Commonly known as: ALDACTONE   Take 25 mg by mouth daily.      sulfamethoxazole-trimethoprim 800-160 MG per tablet   Commonly known as: BACTRIM DS   Take 1 tablet by mouth 2 (two) times daily.      torsemide 100 MG tablet   Commonly known as: DEMADEX   Take 50 mg by mouth 2 (two) times daily.      vitamin C 500 MG tablet   Commonly known as: ASCORBIC ACID   Take 500 mg by mouth daily.        Discharged Condition: Improved    Consults: Cardiology, Dr. Diona Browner  Significant Diagnostic Studies: Dg Chest Port 1 View  03/03/2012  *RADIOLOGY REPORT*  Clinical Data: Chest pressure.  SVT.  PORTABLE CHEST - 1 VIEW  Comparison: 11/12/2011  Findings: The patient has a left-sided transvenous pacemaker, leads overlying the right atrium and right ventricle.  The heart  is enlarged.  There is pulmonary vascular congestion but no overt edema.  IMPRESSION: Cardiomegaly and vascular congestion.   Original Report Authenticated By: Patterson Hammersmith, M.D.     Lab Results: Basic Metabolic Panel:  Basename 03/03/12 0932  NA 140  K 3.9  CL 97  CO2 32  GLUCOSE 164*  BUN 24*  CREATININE 1.35*  CALCIUM 9.2  MG --  PHOS --   Liver Function Tests: No results found for this basename: AST:2,ALT:2,ALKPHOS:2,BILITOT:2,PROT:2,ALBUMIN:2 in the last 72 hours   CBC:  Basename 03/03/12 0932  WBC 9.1  NEUTROABS 6.9  HGB 10.6*  HCT 34.3*  MCV 85.8  PLT 304    Recent Results (from the past 240 hour(s))  MRSA PCR SCREENING     Status: Normal   Collection Time   03/03/12  2:19 PM      Component Value Range Status Comment   MRSA by PCR NEGATIVE  NEGATIVE Final      Hospital Course: She came to the emergency room with a 2 to three-hour history of rapid heartbeat. She has a  previous history of slow heart beat. She had some mild chest discomfort associated with this. She was treated in the emergency room with several medications but responded to intravenous diltiazem. She was back in sinus rhythm but because of the chest discomfort and because of the supraventricular tachycardia she was observed overnight. She had no further episodes of tachycardia and she was started on diltiazem by mouth. She complained that she thought she was having some cellulitis of her leg which improved with oral antibiotics. She had cardiology consultation.  Discharge Exam: Blood pressure 109/95, pulse 73, temperature 98.3 F (36.8 C), temperature source Oral, resp. rate 24, height 5' (1.524 m), weight 172.9 kg (381 lb 2.8 oz), SpO2 98.00%. She is morbidly obese. Her chest is clear. She is in sinus rhythm. Her leg looks okay.  Disposition: Home with home health services      Discharge Orders    Future Appointments: Provider: Department: Dept Phone: Center:   03/10/2012 11:35 AM [facility] [phone] [email or web address]     Future Orders Please Complete By Expires   Discharge patient           Signed: Fredirick Maudlin Pager [phone]  03/05/2012, 8:40 AM

## 2012-03-10 ENCOUNTER — Encounter: Payer: Medicaid Other | Admitting: *Deleted

## 2012-03-14 ENCOUNTER — Encounter: Payer: Self-pay | Admitting: *Deleted

## 2012-03-20 ENCOUNTER — Encounter: Payer: Self-pay | Admitting: Internal Medicine

## 2012-03-20 ENCOUNTER — Ambulatory Visit (INDEPENDENT_AMBULATORY_CARE_PROVIDER_SITE_OTHER): Payer: Self-pay | Admitting: *Deleted

## 2012-03-20 DIAGNOSIS — I442 Atrioventricular block, complete: Secondary | ICD-10-CM

## 2012-03-20 DIAGNOSIS — Z950 Presence of cardiac pacemaker: Secondary | ICD-10-CM

## 2012-04-01 LAB — REMOTE PACEMAKER DEVICE
AL AMPLITUDE: 2.8 mv
ATRIAL PACING PM: 7
BAMS-0001: 175 {beats}/min
BATTERY VOLTAGE: 2.76 V
VENTRICULAR PACING PM: 91

## 2012-04-28 ENCOUNTER — Encounter: Payer: Self-pay | Admitting: *Deleted

## 2012-06-30 ENCOUNTER — Encounter: Payer: Self-pay | Admitting: *Deleted

## 2012-07-07 ENCOUNTER — Encounter: Payer: Self-pay | Admitting: *Deleted

## 2012-07-28 ENCOUNTER — Other Ambulatory Visit: Payer: Self-pay

## 2012-07-28 ENCOUNTER — Ambulatory Visit (INDEPENDENT_AMBULATORY_CARE_PROVIDER_SITE_OTHER): Payer: Medicaid Other | Admitting: *Deleted

## 2012-07-28 ENCOUNTER — Encounter: Payer: Self-pay | Admitting: Internal Medicine

## 2012-07-28 DIAGNOSIS — Z950 Presence of cardiac pacemaker: Secondary | ICD-10-CM

## 2012-07-28 DIAGNOSIS — I442 Atrioventricular block, complete: Secondary | ICD-10-CM

## 2012-08-04 LAB — REMOTE PACEMAKER DEVICE
AL AMPLITUDE: 2.8 mv
AL THRESHOLD: 0.75 V
BAMS-0001: 175 {beats}/min
RV LEAD IMPEDENCE PM: 1088 Ohm
VENTRICULAR PACING PM: 92

## 2012-08-21 ENCOUNTER — Encounter: Payer: Self-pay | Admitting: *Deleted

## 2012-11-03 ENCOUNTER — Emergency Department (HOSPITAL_COMMUNITY)
Admission: EM | Admit: 2012-11-03 | Discharge: 2012-11-03 | Disposition: A | Discharge status: Home / Self Care | Payer: Medicaid Other | Attending: Emergency Medicine | Admitting: Emergency Medicine

## 2012-11-03 ENCOUNTER — Other Ambulatory Visit: Payer: Self-pay

## 2012-11-03 ENCOUNTER — Encounter: Payer: Medicaid Other | Admitting: *Deleted

## 2012-11-03 ENCOUNTER — Encounter (HOSPITAL_COMMUNITY): Payer: Self-pay | Admitting: *Deleted

## 2012-11-03 DIAGNOSIS — Z950 Presence of cardiac pacemaker: Secondary | ICD-10-CM | POA: Insufficient documentation

## 2012-11-03 DIAGNOSIS — Z79899 Other long term (current) drug therapy: Secondary | ICD-10-CM | POA: Insufficient documentation

## 2012-11-03 DIAGNOSIS — E782 Mixed hyperlipidemia: Secondary | ICD-10-CM | POA: Insufficient documentation

## 2012-11-03 DIAGNOSIS — G4730 Sleep apnea, unspecified: Secondary | ICD-10-CM | POA: Insufficient documentation

## 2012-11-03 DIAGNOSIS — Z9889 Other specified postprocedural states: Secondary | ICD-10-CM | POA: Insufficient documentation

## 2012-11-03 DIAGNOSIS — Z9981 Dependence on supplemental oxygen: Secondary | ICD-10-CM | POA: Insufficient documentation

## 2012-11-03 DIAGNOSIS — I252 Old myocardial infarction: Secondary | ICD-10-CM | POA: Insufficient documentation

## 2012-11-03 DIAGNOSIS — L02419 Cutaneous abscess of limb, unspecified: Secondary | ICD-10-CM | POA: Insufficient documentation

## 2012-11-03 DIAGNOSIS — I739 Peripheral vascular disease, unspecified: Secondary | ICD-10-CM | POA: Insufficient documentation

## 2012-11-03 DIAGNOSIS — Z87891 Personal history of nicotine dependence: Secondary | ICD-10-CM | POA: Insufficient documentation

## 2012-11-03 DIAGNOSIS — Z794 Long term (current) use of insulin: Secondary | ICD-10-CM | POA: Insufficient documentation

## 2012-11-03 DIAGNOSIS — L03116 Cellulitis of left lower limb: Secondary | ICD-10-CM

## 2012-11-03 DIAGNOSIS — I10 Essential (primary) hypertension: Secondary | ICD-10-CM | POA: Insufficient documentation

## 2012-11-03 DIAGNOSIS — E119 Type 2 diabetes mellitus without complications: Secondary | ICD-10-CM | POA: Insufficient documentation

## 2012-11-03 DIAGNOSIS — J449 Chronic obstructive pulmonary disease, unspecified: Secondary | ICD-10-CM | POA: Insufficient documentation

## 2012-11-03 DIAGNOSIS — M129 Arthropathy, unspecified: Secondary | ICD-10-CM | POA: Insufficient documentation

## 2012-11-03 DIAGNOSIS — I498 Other specified cardiac arrhythmias: Secondary | ICD-10-CM | POA: Insufficient documentation

## 2012-11-03 DIAGNOSIS — J4489 Other specified chronic obstructive pulmonary disease: Secondary | ICD-10-CM | POA: Insufficient documentation

## 2012-11-03 DIAGNOSIS — I5032 Chronic diastolic (congestive) heart failure: Secondary | ICD-10-CM | POA: Insufficient documentation

## 2012-11-03 DIAGNOSIS — I471 Supraventricular tachycardia: Secondary | ICD-10-CM

## 2012-11-03 MED ORDER — DILTIAZEM LOAD VIA INFUSION: 25.0000 mg | Freq: Once | INTRAVENOUS | Status: DC

## 2012-11-03 MED ORDER — ADENOSINE 6 MG/2ML IV SOLN
INTRAVENOUS | Status: AC
  Administered 2012-11-03: 6 mg
  Filled 2012-11-03: qty 2

## 2012-11-03 MED ORDER — DOXYCYCLINE HYCLATE 100 MG PO CAPS: 100.0000 mg | ORAL_CAPSULE | Freq: Two times a day (BID) | ORAL | Status: DC

## 2012-11-03 MED ORDER — DOXYCYCLINE HYCLATE 100 MG PO TABS
100.0000 mg | ORAL_TABLET | Freq: Once | ORAL | Status: AC
Start: 2012-11-03 — End: 2012-11-03
  Administered 2012-11-03: 100 mg via ORAL
  Filled 2012-11-03: qty 1

## 2012-11-03 MED ORDER — DILTIAZEM HCL 25 MG/5ML IV SOLN: 25.0000 mg | Freq: Once | INTRAVENOUS | Status: DC

## 2012-11-03 MED ORDER — DILTIAZEM HCL 25 MG/5ML IV SOLN: 20.0000 mg | Freq: Once | INTRAVENOUS | Status: DC

## 2012-11-03 MED ORDER — DILTIAZEM LOAD VIA INFUSION
25.0000 mg | Freq: Once | INTRAVENOUS | Status: AC
  Administered 2012-11-03: 25 mg via INTRAVENOUS

## 2012-11-03 NOTE — ED Notes (Signed)
Pt urinated on herself and was cleaned up by daughters. Pt sheets changed as well.

## 2012-11-03 NOTE — ED Notes (Signed)
Pt called EMS for tachycardia. Pt had been late taking her cardizem. Pt refused to come to the hospital because her heart rate came down to 90. EMS were called out about an hour later for a heart rate of 150.

## 2012-11-03 NOTE — ED Provider Notes (Signed)
History     CSN: [id number]  Arrival date & time 11/03/12  0054   First MD Initiated Contact with Patient 11/03/12 0058      Chief Complaint  Patient presents with  . Tachycardia    (Consider location/radiation/quality/duration/timing/severity/associated sxs/prior treatment) HPI HPI Comments: [name] is a 57 y.o. female brought in by ambulance, who presents to the Emergency Department complaining of rapid heart rate. EMS reprots they were called to the home for rapid heart rate. Once they got there the patient took her medicines and heart rate slowed down to the 80s-90s and they left. [place] were [place] again for rapid heart rate this time heart rate was 150s. Patient reports a h/o SVT, pacemaker, on cardizem. She has a h/o morbid obesity and has not walked in 4 years, HTN, COPD, cellulitis, PVD, DM. Denies fever, chills, nausea, vomiting, chest pain, shortness of breath.   PCP Dr. Juanetta Gosling  Past Medical History  Diagnosis Date  . Morbid obesity   . Essential hypertension, benign   . Chronic diastolic heart failure     LVEF 60-65% 2011  . COPD (chronic obstructive pulmonary disease)     Oxygen dependent  . History of cardiac catheterization     Normal coronary arteries 2008  . Obesity hypoventilation syndrome   . Arthritis   . Hiatal hernia   . Symptomatic bradycardia     With pauses 2008 - Followed by Dr. Graciela Husbands  . Cellulitis     Recurrent bilateral LE  . Mixed hyperlipidemia   . Peripheral vascular disease   . Myocardial infarction 1990's  . Sleep apnea   . Type II diabetes mellitus   . Gout   . Nonsustained ventricular tachycardia     Documented 2008  . SVT (supraventricular tachycardia)     Reported possible atrial arrhythmias    Past Surgical History  Procedure Laterality Date  . Abdominal hysterectomy  1980's  . Insert / replace / remove pacemaker  2010    Family History  Problem Relation Age of Onset  . Coronary artery  disease Mother     MI age 4, died.  . Hypertension Father   . Osteoarthritis Father   . Hypertension Sister   . Hypertension Brother   . Obesity Father     History  Substance Use Topics  . Smoking status: Former Smoker -- 0.50 packs/day for .1 years    Types: Cigarettes    Quit date: 05/22/1967  . Smokeless tobacco: Never Used  . Alcohol Use: No    OB History   Grav Para Term Preterm Abortions TAB SAB Ect Mult Living   5 5 5       5       Review of Systems  Constitutional: Negative for fever.       10 Systems reviewed and are negative for acute change except as noted in the HPI.  HENT: Negative for congestion.   Eyes: Negative for discharge and redness.  Respiratory: Negative for cough and shortness of breath.   Cardiovascular: Negative for chest pain.       Tachycardia  Gastrointestinal: Negative for vomiting and abdominal pain.  Musculoskeletal: Negative for back pain.  Skin: Negative for rash.  Neurological: Negative for syncope, numbness and headaches.  Psychiatric/Behavioral:       No behavior change.    Allergies  Morphine and related  Home Medications   Current Outpatient Rx  Name  Route  Sig  Dispense  Refill  . albuterol (PROVENTIL) (2.5 MG/3ML) 0.083% nebulizer solution   Nebulization   Take 2.5 mg by nebulization every 6 (six) hours as needed. For shortness of breath         . allopurinol (ZYLOPRIM) 300 MG tablet   Oral   Take 300 mg by mouth daily.         Marland Kitchen EXPIRED: carvedilol (COREG) 25 MG tablet   Oral   Take 1 tablet (25 mg total) by mouth 2 (two) times daily with a meal.   180 tablet   3   . cloNIDine (CATAPRES) 0.2 MG tablet   Oral   Take 0.2 mg by mouth 2 (two) times daily.         Marland Kitchen diltiazem (CARDIZEM) 30 MG tablet   Oral   Take 1 tablet (30 mg total) by mouth every 6 (six) hours.   120 tablet   12   . doxycycline (VIBRA-TABS) 100 MG tablet   Oral   Take 1 tablet (100 mg total) by mouth 2 (two) times daily.   20  tablet   1   . doxycycline (VIBRAMYCIN) 100 MG capsule   Oral   Take 1 capsule (100 mg total) by mouth 2 (two) times daily.   20 capsule   0   . ferrous sulfate 325 (65 FE) MG tablet   Oral   Take 325 mg by mouth daily.           . hydrALAZINE (APRESOLINE) 100 MG tablet   Oral   Take 100 mg by mouth 4 (four) times daily.         . insulin lispro protamine-insulin lispro (HUMALOG 75/25) (75-25) 100 UNIT/ML SUSP   Subcutaneous   Inject 20 Units into the skin 3 (three) times daily.          . Multiple Vitamins-Minerals (MULTIVITAMINS THER. W/MINERALS) TABS   Oral   Take 1 tablet by mouth daily.           Marland Kitchen neomycin-bacitracin-polymyxin (NEOSPORIN) [phone] ointment      APPLY TO YOUR LEGS ON EVERY Tuesday, Thursday, AND Saturday.   28.3 g   0   . nystatin (MYCOSTATIN) cream      APPLY TO YOUR SKIN FOLDS AND LEGS EVERY Monday, Wednesday , AND Friday.   30 g   1   . spironolactone (ALDACTONE) 25 MG tablet   Oral   Take 25 mg by mouth daily.         Marland Kitchen sulfamethoxazole-trimethoprim (BACTRIM DS) 800-160 MG per tablet   Oral   Take 1 tablet by mouth 2 (two) times daily.   20 tablet   1   . torsemide (DEMADEX) 100 MG tablet   Oral   Take 50 mg by mouth 2 (two) times daily.         . vitamin C (ASCORBIC ACID) 500 MG tablet   Oral   Take 500 mg by mouth daily.           There were no vitals taken for this visit.  Physical Exam  Nursing note and vitals reviewed. Constitutional:  Awake, alert, morbidly obese  HENT:  Head: Normocephalic and atraumatic.  Right Ear: External ear normal.  Left Ear: External ear normal.  Right side of lip with a healing fever blister.  Eyes: EOM are normal. Pupils are equal, round, and reactive to light.  Neck: Neck supple.  Cardiovascular:  Tachycardia rate 156  Pulmonary/Chest: Effort normal and breath sounds normal.  She exhibits no tenderness.  Abdominal: Soft. There is no tenderness. There is no rebound.   Obese, large pannus  Musculoskeletal: She exhibits no tenderness.  Baseline ROM, no obvious new focal weakness.left thigh with beginning cellulitis  Neurological:  Mental status and motor strength appears baseline for patient and situation.  Skin: No rash noted.  Psychiatric: She has a normal mood and affect.    ED Course  Procedures (including critical care time)  Labs Reviewed - No data to display No results found.   1. SVT (supraventricular tachycardia)   2. Cellulitis of left leg     Date: 11/03/2012   0104  Rate: 151  Rhythm: atrial flutter  QRS Axis: normal  Intervals: atrial flutter  ST/T Wave abnormalities: nonspecific ST changes  Conduction Disutrbances:rapid ventricular response  Narrative Interpretation:   Old EKG Reviewed: SVT similar to 02/2012  Cardizem 25 mg and adenocard 6 mg  Date: 11/03/2012   0120  Rate: 87  Rhythm: normal sinus rhythm and premature ventricular contractions (PVC)  QRS Axis: normal  Intervals: normal  ST/T Wave abnormalities: normal  Conduction Disutrbances:none  Narrative Interpretation:   Old EKG Reviewed: conversion rhythm     0110 Adenocard 6 mg given. Immediate response was a slowing of the heart rate to 90 and it then reverted to a tachycardia 156-160. 0119 Given cardizem 25 mg bolus with subsequent response with heart rate in the 80-90.  0250 Patient is resting without c/o. 0350 No further SVT. Reviewed with patient need to follow up with cardiologist regarding the episode of SVT.  MDM  Patient with SVT that did not respond to adenocard 6 mg but did respond to cardizem 25 mg. She had taken her home cardizem at 2330. No further SVT in the ER. She will follow up with her cardiologist. Pt stable in ED with no significant deterioration in condition.The patient appears reasonably screened and/or stabilized for discharge and I doubt any other medical condition or other [facility] requiring further screening, evaluation, or treatment in  the ED at this time prior to discharge.  MDM Reviewed: nursing note and vitals           Nicoletta Dress. Colon Branch, MD 11/03/12 1610

## 2012-11-14 ENCOUNTER — Encounter: Payer: Self-pay | Admitting: *Deleted

## 2013-02-20 ENCOUNTER — Emergency Department (HOSPITAL_COMMUNITY)
Admission: EM | Admit: 2013-02-20 | Discharge: 2013-02-20 | Disposition: A | Discharge status: Home / Self Care | Payer: Medicaid Other | Attending: Emergency Medicine | Admitting: Emergency Medicine

## 2013-02-20 ENCOUNTER — Encounter (HOSPITAL_COMMUNITY): Payer: Self-pay | Admitting: *Deleted

## 2013-02-20 DIAGNOSIS — J4489 Other specified chronic obstructive pulmonary disease: Secondary | ICD-10-CM | POA: Insufficient documentation

## 2013-02-20 DIAGNOSIS — E662 Morbid (severe) obesity with alveolar hypoventilation: Secondary | ICD-10-CM | POA: Insufficient documentation

## 2013-02-20 DIAGNOSIS — Z794 Long term (current) use of insulin: Secondary | ICD-10-CM | POA: Insufficient documentation

## 2013-02-20 DIAGNOSIS — E119 Type 2 diabetes mellitus without complications: Secondary | ICD-10-CM | POA: Insufficient documentation

## 2013-02-20 DIAGNOSIS — Z87891 Personal history of nicotine dependence: Secondary | ICD-10-CM | POA: Insufficient documentation

## 2013-02-20 DIAGNOSIS — Z9861 Coronary angioplasty status: Secondary | ICD-10-CM | POA: Insufficient documentation

## 2013-02-20 DIAGNOSIS — Z872 Personal history of diseases of the skin and subcutaneous tissue: Secondary | ICD-10-CM | POA: Insufficient documentation

## 2013-02-20 DIAGNOSIS — I471 Supraventricular tachycardia: Secondary | ICD-10-CM

## 2013-02-20 DIAGNOSIS — Z862 Personal history of diseases of the blood and blood-forming organs and certain disorders involving the immune mechanism: Secondary | ICD-10-CM | POA: Insufficient documentation

## 2013-02-20 DIAGNOSIS — J449 Chronic obstructive pulmonary disease, unspecified: Secondary | ICD-10-CM | POA: Insufficient documentation

## 2013-02-20 DIAGNOSIS — Z8639 Personal history of other endocrine, nutritional and metabolic disease: Secondary | ICD-10-CM | POA: Insufficient documentation

## 2013-02-20 DIAGNOSIS — I498 Other specified cardiac arrhythmias: Secondary | ICD-10-CM | POA: Insufficient documentation

## 2013-02-20 DIAGNOSIS — Z792 Long term (current) use of antibiotics: Secondary | ICD-10-CM | POA: Insufficient documentation

## 2013-02-20 DIAGNOSIS — Z79899 Other long term (current) drug therapy: Secondary | ICD-10-CM | POA: Insufficient documentation

## 2013-02-20 DIAGNOSIS — I10 Essential (primary) hypertension: Secondary | ICD-10-CM | POA: Insufficient documentation

## 2013-02-20 DIAGNOSIS — I5032 Chronic diastolic (congestive) heart failure: Secondary | ICD-10-CM | POA: Insufficient documentation

## 2013-02-20 DIAGNOSIS — I252 Old myocardial infarction: Secondary | ICD-10-CM | POA: Insufficient documentation

## 2013-02-20 DIAGNOSIS — M109 Gout, unspecified: Secondary | ICD-10-CM | POA: Insufficient documentation

## 2013-02-20 LAB — CBC WITH DIFFERENTIAL/PLATELET
Basophils Absolute: 0 10*3/uL (ref 0.0–0.1)
Basophils Relative: 0 % (ref 0–1)
Eosinophils Absolute: 0.5 10*3/uL (ref 0.0–0.7)
Eosinophils Relative: 5 % (ref 0–5)
Lymphs Abs: 1.7 10*3/uL (ref 0.7–4.0)
MCH: 28.7 pg (ref 26.0–34.0)
MCHC: 31.8 g/dL (ref 30.0–36.0)
MCV: 90.3 fL (ref 78.0–100.0)
Neutro Abs: 6.8 10*3/uL (ref 1.7–7.7)
Neutrophils Relative %: 68 % (ref 43–77)
Platelets: 285 10*3/uL (ref 150–400)
RBC: 3.59 MIL/uL — ABNORMAL LOW (ref 3.87–5.11)
RDW: 17.8 % — ABNORMAL HIGH (ref 11.5–15.5)

## 2013-02-20 LAB — BASIC METABOLIC PANEL
Calcium: 9.1 mg/dL (ref 8.4–10.5)
Chloride: 95 mEq/L — ABNORMAL LOW (ref 96–112)
GFR calc Af Amer: 37 mL/min — ABNORMAL LOW (ref >90)
GFR calc non Af Amer: 32 mL/min — ABNORMAL LOW (ref >90)
Glucose, Bld: 171 mg/dL — ABNORMAL HIGH (ref 70–99)
Potassium: 3.5 mEq/L (ref 3.5–5.1)
Sodium: 138 mEq/L (ref 135–145)

## 2013-02-20 MED ORDER — HYDROMORPHONE HCL 2 MG PO TABS
2.0000 mg | ORAL_TABLET | Freq: Once | ORAL | Status: AC
  Administered 2013-02-20: 2 mg via ORAL
  Filled 2013-02-20: qty 1

## 2013-02-20 MED ORDER — ADENOSINE 6 MG/2ML IV SOLN
6.0000 mg | Freq: Once | INTRAVENOUS | Status: AC
  Administered 2013-02-20: 6 mg via INTRAVENOUS

## 2013-02-20 MED ORDER — DILTIAZEM HCL 100 MG IV SOLR
5.0000 mg/h | INTRAVENOUS | Status: DC
  Filled 2013-02-20: qty 100

## 2013-02-20 MED ORDER — CLOTRIMAZOLE 1 % EX CREA
TOPICAL_CREAM | Freq: Two times a day (BID) | CUTANEOUS | Status: DC
  Administered 2013-02-20: 03:00:00 via TOPICAL
  Filled 2013-02-20: qty 15

## 2013-02-20 MED ORDER — DILTIAZEM HCL 25 MG/5ML IV SOLN
INTRAVENOUS | Status: AC
  Filled 2013-02-20: qty 5

## 2013-02-20 MED ORDER — CLOTRIMAZOLE 1 % EX CREA
TOPICAL_CREAM | CUTANEOUS | Status: AC
  Filled 2013-02-20: qty 15

## 2013-02-20 MED ORDER — ADENOSINE 6 MG/2ML IV SOLN
12.0000 mg | Freq: Once | INTRAVENOUS | Status: AC
  Administered 2013-02-20: 12 mg via INTRAVENOUS

## 2013-02-20 MED ORDER — DILTIAZEM HCL 25 MG/5ML IV SOLN
25.0000 mg | Freq: Once | INTRAVENOUS | Status: AC
  Administered 2013-02-20: 25 mg via INTRAVENOUS

## 2013-02-20 NOTE — ED Notes (Signed)
Pt alert & oriented x4, stable gait. Patient given discharge instructions, paperwork & prescription(s). Patient  instructed to stop at the registration desk to finish any additional paperwork. Patient verbalized understanding. Pt left department w/ no further questions. 

## 2013-02-20 NOTE — ED Provider Notes (Signed)
CSN: [id number]     Arrival date & time 02/20/13  0018 History   First MD Initiated Contact with Patient 02/20/13 0024     Chief Complaint  Patient presents with  . Palpitations   (Consider location/radiation/quality/duration/timing/severity/associated sxs/prior Treatment) HPI This is a 57 year old female with a history of morbid obesity and supraventricular tachycardia. She is here with palpitations that began about 30 minutes prior to arrival. The onset was abrupt. She attributes this to delaying her scheduled dose of Cardizem by 2 hours. She characterizes the palpitations as a rapid heartbeat. She has tried vagaling without relief. She denies chest pain, shortness of breath, diaphoresis or nausea. EMS reports her heart rate has been in the 150s.  She states she has a yeast rash of her left axilla.  Past Medical History  Diagnosis Date  . Morbid obesity   . Essential hypertension, benign   . Chronic diastolic heart failure     LVEF 60-65% 2011  . COPD (chronic obstructive pulmonary disease)     Oxygen dependent  . History of cardiac catheterization     Normal coronary arteries 2008  . Obesity hypoventilation syndrome   . Arthritis   . Hiatal hernia   . Symptomatic bradycardia     With pauses 2008 - Followed by Dr. Graciela Husbands  . Cellulitis     Recurrent bilateral LE  . Mixed hyperlipidemia   . Peripheral vascular disease   . Myocardial infarction 1990's  . Sleep apnea   . Type II diabetes mellitus   . Gout   . Nonsustained ventricular tachycardia     Documented 2008  . SVT (supraventricular tachycardia)     Reported possible atrial arrhythmias   Past Surgical History  Procedure Laterality Date  . Abdominal hysterectomy  1980's  . Insert / replace / remove pacemaker  2010   Family History  Problem Relation Age of Onset  . Coronary artery disease Mother     MI age 21, died.  . Hypertension Father   . Osteoarthritis Father   . Hypertension Sister   . Hypertension Brother    . Obesity Father    History  Substance Use Topics  . Smoking status: Former Smoker -- 0.50 packs/day for .1 years    Types: Cigarettes    Quit date: 05/22/1967  . Smokeless tobacco: Never Used  . Alcohol Use: No   OB History   Grav Para Term Preterm Abortions TAB SAB Ect Mult Living   5 5 5       5      Review of Systems  All other systems reviewed and are negative.    Allergies  Morphine and related  Home Medications   Current Outpatient Rx  Name  Route  Sig  Dispense  Refill  . ALPRAZolam (XANAX) 1 MG tablet   Oral   Take 1 mg by mouth at bedtime as needed for sleep.         Marland Kitchen albuterol (PROVENTIL) (2.5 MG/3ML) 0.083% nebulizer solution   Nebulization   Take 2.5 mg by nebulization every 6 (six) hours as needed. For shortness of breath         . allopurinol (ZYLOPRIM) 300 MG tablet   Oral   Take 300 mg by mouth daily.         Marland Kitchen EXPIRED: carvedilol (COREG) 25 MG tablet   Oral   Take 1 tablet (25 mg total) by mouth 2 (two) times daily with a meal.   180 tablet   3   .  EXPIRED: cloNIDine (CATAPRES) 0.2 MG tablet   Oral   Take 0.2 mg by mouth 2 (two) times daily.         Marland Kitchen diltiazem (CARDIZEM) 30 MG tablet   Oral   Take 1 tablet (30 mg total) by mouth every 6 (six) hours.   120 tablet   12   . doxycycline (VIBRA-TABS) 100 MG tablet   Oral   Take 1 tablet (100 mg total) by mouth 2 (two) times daily.   20 tablet   1   . doxycycline (VIBRAMYCIN) 100 MG capsule   Oral   Take 1 capsule (100 mg total) by mouth 2 (two) times daily.   20 capsule   0   . ferrous sulfate 325 (65 FE) MG tablet   Oral   Take 325 mg by mouth daily.           Marland Kitchen EXPIRED: hydrALAZINE (APRESOLINE) 100 MG tablet   Oral   Take 100 mg by mouth 4 (four) times daily.         . insulin lispro protamine-insulin lispro (HUMALOG 75/25) (75-25) 100 UNIT/ML SUSP   Subcutaneous   Inject 20 Units into the skin 3 (three) times daily.          . Multiple Vitamins-Minerals  (MULTIVITAMINS THER. W/MINERALS) TABS   Oral   Take 1 tablet by mouth daily.           Marland Kitchen neomycin-bacitracin-polymyxin (NEOSPORIN) [phone] ointment      APPLY TO YOUR LEGS ON EVERY Tuesday, Thursday, AND Saturday.   28.3 g   0   . nystatin (MYCOSTATIN) cream      APPLY TO YOUR SKIN FOLDS AND LEGS EVERY Monday, Wednesday , AND Friday.   30 g   1   . spironolactone (ALDACTONE) 25 MG tablet   Oral   Take 25 mg by mouth daily.         Marland Kitchen sulfamethoxazole-trimethoprim (BACTRIM DS) 800-160 MG per tablet   Oral   Take 1 tablet by mouth 2 (two) times daily.   20 tablet   1   . torsemide (DEMADEX) 100 MG tablet   Oral   Take 50 mg by mouth 2 (two) times daily.         . vitamin C (ASCORBIC ACID) 500 MG tablet   Oral   Take 500 mg by mouth daily.          BP 151/94  Pulse 154  Temp(Src) 98.5 F (36.9 C) (Oral)  Resp 23  SpO2 97%  Physical Exam General: Well-developed, morbidly obese female in no acute distress; appearance consistent with age of record HENT: normocephalic; atraumatic Eyes: pupils equal, round and reactive to light; extraocular muscles intact Neck: supple Heart: regular rate and rhythm; tachycardia Lungs: clear to auscultation bilaterally Abdomen: soft; obese Extremities: No deformity; edema of lower legs Neurologic: Awake, alert and oriented; motor function intact in all extremities and symmetric; no facial droop Skin: Warm and dry; chronic appearing thickening of skin of lower legs; acanthosis nigricans of neck and axillae; intertriginous rash of left axilla Psychiatric: Normal mood and affect    ED Course  Procedures (including critical care time)  CARDIOVERSION After informed consent was obtained an attempt at cardioversion was made by administering 6 mg of adenosine by rapid IV push. There was a transient return to sinus rhythm which reverted to supraventricular tachycardia. A second attempt was made with 12 mg of adenosine with the  same result.  Finally the  patient was given 25 mg of IV Cardizem with conversion to normal sinus rhythm. Occasional PVCs were noted but the patient has a history of this. The patient tolerated this well and there were no immediate complications.   MDM   Nursing notes and vitals signs, including pulse oximetry, reviewed.  Summary of this visit's results, reviewed by myself:  Labs:  Results for orders placed during the hospital encounter of 02/20/13 (from the past 24 hour(s))  CBC WITH DIFFERENTIAL     Status: Abnormal   Collection Time    02/20/13 12:47 AM      Result Value Range   WBC 10.0  4.0 - 10.5 K/uL   RBC 3.59 (*) 3.87 - 5.11 MIL/uL   Hemoglobin 10.3 (*) 12.0 - 15.0 g/dL   HCT 29.5 (*) 62.1 - 30.8 %   MCV 90.3  78.0 - 100.0 fL   MCH 28.7  26.0 - 34.0 pg   MCHC 31.8  30.0 - 36.0 g/dL   RDW 65.7 (*) 84.6 - 96.2 %   Platelets 285  150 - 400 K/uL   Neutrophils Relative % 68  43 - 77 %   Neutro Abs 6.8  1.7 - 7.7 K/uL   Lymphocytes Relative 17  12 - 46 %   Lymphs Abs 1.7  0.7 - 4.0 K/uL   Monocytes Relative 10  3 - 12 %   Monocytes Absolute 1.0  0.1 - 1.0 K/uL   Eosinophils Relative 5  0 - 5 %   Eosinophils Absolute 0.5  0.0 - 0.7 K/uL   Basophils Relative 0  0 - 1 %   Basophils Absolute 0.0  0.0 - 0.1 K/uL  BASIC METABOLIC PANEL     Status: Abnormal   Collection Time    02/20/13 12:47 AM      Result Value Range   Sodium 138  135 - 145 mEq/L   Potassium 3.5  3.5 - 5.1 mEq/L   Chloride 95 (*) 96 - 112 mEq/L   CO2 31  19 - 32 mEq/L   Glucose, Bld 171 (*) 70 - 99 mg/dL   BUN 25 (*) 6 - 23 mg/dL   Creatinine, Ser 9.52 (*) 0.50 - 1.10 mg/dL   Calcium 9.1  8.4 - 84.1 mg/dL   GFR calc non Af Amer 32 (*) >90 mL/min   GFR calc Af Amer 37 (*) >90 mL/min  TROPONIN I     Status: None   Collection Time    02/20/13 12:47 AM      Result Value Range   Troponin I <0.30  <0.30 ng/mL     EKG Interpretation:  Date & Time: 02/20/2013 12:26 AM  Rate: 153  Rhythm:  supraventricular tachycardia (SVT)  QRS Axis: indeterminate  Intervals: normal  ST/T Wave abnormalities: nonspecific ST changes  Conduction Disutrbances:none  Narrative Interpretation:   Old EKG Reviewed: Previously sinus rhythm with first degree AV block and PVCs  EKG Interpretation:  Date & Time: 02/20/2013 12:56 AM  Rate: 82  Rhythm: normal sinus rhythm  QRS Axis: normal  Intervals: PR prolonged  ST/T Wave abnormalities: normal  Conduction Disutrbances:first-degree A-V block   Narrative Interpretation:   Old EKG Reviewed: Previously SVT  1:51 AM Patient remains in normal sinus rhythm with a controlled rate.     Hanley Seamen, MD 02/20/13 0222

## 2013-02-20 NOTE — ED Notes (Signed)
Pt to department via EMS.  Call due to tachycardia.  Reported rate approximately 150.

## 2013-02-24 ENCOUNTER — Emergency Department (HOSPITAL_COMMUNITY): Payer: Medicaid Other

## 2013-02-24 ENCOUNTER — Encounter (HOSPITAL_COMMUNITY): Payer: Self-pay | Admitting: Emergency Medicine

## 2013-02-24 ENCOUNTER — Inpatient Hospital Stay (HOSPITAL_COMMUNITY): Payer: Medicaid Other

## 2013-02-24 ENCOUNTER — Inpatient Hospital Stay (HOSPITAL_COMMUNITY)
Admission: EM | Admit: 2013-02-24 | Discharge: 2013-03-27 | DRG: 004 | Disposition: A | Discharge status: Home Health | Payer: Medicaid Other | Attending: Pulmonary Disease | Admitting: Pulmonary Disease

## 2013-02-24 ENCOUNTER — Inpatient Hospital Stay (HOSPITAL_COMMUNITY): Payer: Medicaid Other | Admitting: Anesthesiology

## 2013-02-24 ENCOUNTER — Encounter (HOSPITAL_COMMUNITY): Payer: Self-pay | Admitting: Anesthesiology

## 2013-02-24 DIAGNOSIS — I5032 Chronic diastolic (congestive) heart failure: Secondary | ICD-10-CM

## 2013-02-24 DIAGNOSIS — D72829 Elevated white blood cell count, unspecified: Secondary | ICD-10-CM | POA: Diagnosis present

## 2013-02-24 DIAGNOSIS — M179 Osteoarthritis of knee, unspecified: Secondary | ICD-10-CM

## 2013-02-24 DIAGNOSIS — E785 Hyperlipidemia, unspecified: Secondary | ICD-10-CM

## 2013-02-24 DIAGNOSIS — E782 Mixed hyperlipidemia: Secondary | ICD-10-CM | POA: Diagnosis present

## 2013-02-24 DIAGNOSIS — R601 Generalized edema: Secondary | ICD-10-CM

## 2013-02-24 DIAGNOSIS — IMO0002 Reserved for concepts with insufficient information to code with codable children: Secondary | ICD-10-CM

## 2013-02-24 DIAGNOSIS — J9600 Acute respiratory failure, unspecified whether with hypoxia or hypercapnia: Secondary | ICD-10-CM

## 2013-02-24 DIAGNOSIS — Z930 Tracheostomy status: Secondary | ICD-10-CM

## 2013-02-24 DIAGNOSIS — G4737 Central sleep apnea in conditions classified elsewhere: Secondary | ICD-10-CM | POA: Diagnosis present

## 2013-02-24 DIAGNOSIS — J9620 Acute and chronic respiratory failure, unspecified whether with hypoxia or hypercapnia: Principal | ICD-10-CM

## 2013-02-24 DIAGNOSIS — I498 Other specified cardiac arrhythmias: Secondary | ICD-10-CM

## 2013-02-24 DIAGNOSIS — R0602 Shortness of breath: Secondary | ICD-10-CM

## 2013-02-24 DIAGNOSIS — I2510 Atherosclerotic heart disease of native coronary artery without angina pectoris: Secondary | ICD-10-CM

## 2013-02-24 DIAGNOSIS — G4733 Obstructive sleep apnea (adult) (pediatric): Secondary | ICD-10-CM

## 2013-02-24 DIAGNOSIS — E870 Hyperosmolality and hypernatremia: Secondary | ICD-10-CM | POA: Diagnosis not present

## 2013-02-24 DIAGNOSIS — L03119 Cellulitis of unspecified part of limb: Secondary | ICD-10-CM

## 2013-02-24 DIAGNOSIS — L0390 Cellulitis, unspecified: Secondary | ICD-10-CM

## 2013-02-24 DIAGNOSIS — Z6841 Body Mass Index (BMI) 40.0 and over, adult: Secondary | ICD-10-CM

## 2013-02-24 DIAGNOSIS — Z9911 Dependence on respirator [ventilator] status: Secondary | ICD-10-CM

## 2013-02-24 DIAGNOSIS — Z7401 Bed confinement status: Secondary | ICD-10-CM

## 2013-02-24 DIAGNOSIS — I10 Essential (primary) hypertension: Secondary | ICD-10-CM

## 2013-02-24 DIAGNOSIS — I5033 Acute on chronic diastolic (congestive) heart failure: Secondary | ICD-10-CM

## 2013-02-24 DIAGNOSIS — Z8679 Personal history of other diseases of the circulatory system: Secondary | ICD-10-CM

## 2013-02-24 DIAGNOSIS — L02419 Cutaneous abscess of limb, unspecified: Secondary | ICD-10-CM

## 2013-02-24 DIAGNOSIS — Z950 Presence of cardiac pacemaker: Secondary | ICD-10-CM

## 2013-02-24 DIAGNOSIS — J4489 Other specified chronic obstructive pulmonary disease: Secondary | ICD-10-CM | POA: Diagnosis present

## 2013-02-24 DIAGNOSIS — IMO0001 Reserved for inherently not codable concepts without codable children: Secondary | ICD-10-CM | POA: Diagnosis present

## 2013-02-24 DIAGNOSIS — T17308A Unspecified foreign body in larynx causing other injury, initial encounter: Secondary | ICD-10-CM | POA: Diagnosis not present

## 2013-02-24 DIAGNOSIS — I252 Old myocardial infarction: Secondary | ICD-10-CM

## 2013-02-24 DIAGNOSIS — D509 Iron deficiency anemia, unspecified: Secondary | ICD-10-CM

## 2013-02-24 DIAGNOSIS — J45909 Unspecified asthma, uncomplicated: Secondary | ICD-10-CM

## 2013-02-24 DIAGNOSIS — E1165 Type 2 diabetes mellitus with hyperglycemia: Secondary | ICD-10-CM

## 2013-02-24 DIAGNOSIS — M545 Low back pain, unspecified: Secondary | ICD-10-CM

## 2013-02-24 DIAGNOSIS — J449 Chronic obstructive pulmonary disease, unspecified: Secondary | ICD-10-CM | POA: Diagnosis present

## 2013-02-24 DIAGNOSIS — I509 Heart failure, unspecified: Secondary | ICD-10-CM

## 2013-02-24 DIAGNOSIS — E871 Hypo-osmolality and hyponatremia: Secondary | ICD-10-CM

## 2013-02-24 DIAGNOSIS — M1990 Unspecified osteoarthritis, unspecified site: Secondary | ICD-10-CM

## 2013-02-24 DIAGNOSIS — N189 Chronic kidney disease, unspecified: Secondary | ICD-10-CM | POA: Diagnosis present

## 2013-02-24 DIAGNOSIS — L03116 Cellulitis of left lower limb: Secondary | ICD-10-CM

## 2013-02-24 DIAGNOSIS — F411 Generalized anxiety disorder: Secondary | ICD-10-CM | POA: Diagnosis present

## 2013-02-24 DIAGNOSIS — Y9210 Unspecified residential institution as the place of occurrence of the external cause: Secondary | ICD-10-CM | POA: Diagnosis not present

## 2013-02-24 DIAGNOSIS — I442 Atrioventricular block, complete: Secondary | ICD-10-CM

## 2013-02-24 DIAGNOSIS — Z87891 Personal history of nicotine dependence: Secondary | ICD-10-CM

## 2013-02-24 DIAGNOSIS — M069 Rheumatoid arthritis, unspecified: Secondary | ICD-10-CM

## 2013-02-24 DIAGNOSIS — Z9981 Dependence on supplemental oxygen: Secondary | ICD-10-CM

## 2013-02-24 DIAGNOSIS — E662 Morbid (severe) obesity with alveolar hypoventilation: Secondary | ICD-10-CM

## 2013-02-24 DIAGNOSIS — I471 Supraventricular tachycardia, unspecified: Secondary | ICD-10-CM

## 2013-02-24 DIAGNOSIS — N318 Other neuromuscular dysfunction of bladder: Secondary | ICD-10-CM

## 2013-02-24 DIAGNOSIS — Z79899 Other long term (current) drug therapy: Secondary | ICD-10-CM

## 2013-02-24 DIAGNOSIS — I739 Peripheral vascular disease, unspecified: Secondary | ICD-10-CM | POA: Diagnosis present

## 2013-02-24 DIAGNOSIS — Z794 Long term (current) use of insulin: Secondary | ICD-10-CM

## 2013-02-24 DIAGNOSIS — E46 Unspecified protein-calorie malnutrition: Secondary | ICD-10-CM | POA: Diagnosis present

## 2013-02-24 DIAGNOSIS — E876 Hypokalemia: Secondary | ICD-10-CM | POA: Diagnosis not present

## 2013-02-24 DIAGNOSIS — R001 Bradycardia, unspecified: Secondary | ICD-10-CM

## 2013-02-24 DIAGNOSIS — A498 Other bacterial infections of unspecified site: Secondary | ICD-10-CM | POA: Diagnosis present

## 2013-02-24 DIAGNOSIS — M109 Gout, unspecified: Secondary | ICD-10-CM | POA: Diagnosis present

## 2013-02-24 DIAGNOSIS — Z23 Encounter for immunization: Secondary | ICD-10-CM

## 2013-02-24 DIAGNOSIS — L304 Erythema intertrigo: Secondary | ICD-10-CM

## 2013-02-24 DIAGNOSIS — N179 Acute kidney failure, unspecified: Secondary | ICD-10-CM

## 2013-02-24 DIAGNOSIS — K219 Gastro-esophageal reflux disease without esophagitis: Secondary | ICD-10-CM

## 2013-02-24 DIAGNOSIS — M1710 Unilateral primary osteoarthritis, unspecified knee: Secondary | ICD-10-CM

## 2013-02-24 DIAGNOSIS — I129 Hypertensive chronic kidney disease with stage 1 through stage 4 chronic kidney disease, or unspecified chronic kidney disease: Secondary | ICD-10-CM | POA: Diagnosis present

## 2013-02-24 LAB — CBC WITH DIFFERENTIAL/PLATELET
Basophils Absolute: 0 10*3/uL (ref 0.0–0.1)
Eosinophils Absolute: 0.6 10*3/uL (ref 0.0–0.7)
Eosinophils Relative: 5 % (ref 0–5)
HCT: 31.1 % — ABNORMAL LOW (ref 36.0–46.0)
Lymphocytes Relative: 13 % (ref 12–46)
Lymphs Abs: 1.5 10*3/uL (ref 0.7–4.0)
MCH: 29.1 pg (ref 26.0–34.0)
MCV: 91.5 fL (ref 78.0–100.0)
Neutrophils Relative %: 76 % (ref 43–77)
Platelets: 296 10*3/uL (ref 150–400)
RBC: 3.4 MIL/uL — ABNORMAL LOW (ref 3.87–5.11)
RDW: 17.7 % — ABNORMAL HIGH (ref 11.5–15.5)
WBC: 11.6 10*3/uL — ABNORMAL HIGH (ref 4.0–10.5)

## 2013-02-24 LAB — URINALYSIS, ROUTINE W REFLEX MICROSCOPIC
Bilirubin Urine: NEGATIVE
Glucose, UA: NEGATIVE mg/dL
Nitrite: NEGATIVE
Protein, ur: 100 mg/dL — AB
Urobilinogen, UA: 0.2 mg/dL (ref 0.0–1.0)
pH: 6 (ref 5.0–8.0)

## 2013-02-24 LAB — BASIC METABOLIC PANEL
BUN: 19 mg/dL (ref 6–23)
Calcium: 9.6 mg/dL (ref 8.4–10.5)
GFR calc non Af Amer: 43 mL/min — ABNORMAL LOW (ref >90)
Glucose, Bld: 183 mg/dL — ABNORMAL HIGH (ref 70–99)
Potassium: 4.4 mEq/L (ref 3.5–5.1)
Sodium: 142 mEq/L (ref 135–145)

## 2013-02-24 LAB — BLOOD GAS, ARTERIAL
Acid-Base Excess: 6.3 mmol/L — ABNORMAL HIGH (ref 0.0–2.0)
Bicarbonate: 33.9 mEq/L — ABNORMAL HIGH (ref 20.0–24.0)
Delivery systems: POSITIVE
Drawn by: 317771
Drawn by: 317771
Expiratory PAP: 8
FIO2: 1 %
Inspiratory PAP: 16
O2 Saturation: 99 %
PEEP: 8 cmH2O
Patient temperature: 37
Patient temperature: 37
RATE: 8 resp/min
RATE: 14 resp/min
pCO2 arterial: 96.3 mmHg (ref 35.0–45.0)
pH, Arterial: 7.154 — CL (ref 7.350–7.450)
pO2, Arterial: 298 mmHg — ABNORMAL HIGH (ref 80.0–100.0)

## 2013-02-24 LAB — MRSA PCR SCREENING: MRSA by PCR: NEGATIVE

## 2013-02-24 LAB — GLUCOSE, CAPILLARY

## 2013-02-24 LAB — URINE MICROSCOPIC-ADD ON

## 2013-02-24 LAB — TROPONIN I
Troponin I: <0.3 ng/mL (ref <0.30)
Troponin I: <0.3 ng/mL (ref <0.30)

## 2013-02-24 LAB — PRO B NATRIURETIC PEPTIDE: Pro B Natriuretic peptide (BNP): 938.5 pg/mL — ABNORMAL HIGH (ref 0–125)

## 2013-02-24 MED ORDER — ONDANSETRON HCL 4 MG PO TABS: 4.0000 mg | ORAL_TABLET | Freq: Four times a day (QID) | ORAL | Status: DC | PRN

## 2013-02-24 MED ORDER — HYDROMORPHONE HCL PF 1 MG/ML IJ SOLN
1.0000 mg | Freq: Once | INTRAMUSCULAR | Status: AC
  Administered 2013-02-24: 1 mg via INTRAVENOUS
  Filled 2013-02-24: qty 1

## 2013-02-24 MED ORDER — ALBUTEROL SULFATE (5 MG/ML) 0.5% IN NEBU
2.5000 mg | INHALATION_SOLUTION | RESPIRATORY_TRACT | Status: DC
  Administered 2013-02-24 – 2013-03-25 (×168): 2.5 mg via RESPIRATORY_TRACT
  Filled 2013-02-24 (×168): qty 0.5

## 2013-02-24 MED ORDER — DILTIAZEM HCL 30 MG PO TABS
30.0000 mg | ORAL_TABLET | Freq: Four times a day (QID) | ORAL | Status: DC
  Administered 2013-02-24: 30 mg via ORAL
  Filled 2013-02-24: qty 1

## 2013-02-24 MED ORDER — PANTOPRAZOLE SODIUM 40 MG IV SOLR
40.0000 mg | Freq: Every day | INTRAVENOUS | Status: DC
  Administered 2013-02-24 – 2013-03-03 (×8): 40 mg via INTRAVENOUS
  Filled 2013-02-24 (×8): qty 40

## 2013-02-24 MED ORDER — CHLORHEXIDINE GLUCONATE 0.12 % MT SOLN
15.0000 mL | Freq: Two times a day (BID) | OROMUCOSAL | Status: DC
  Administered 2013-02-24 – 2013-03-05 (×18): 15 mL via OROMUCOSAL
  Filled 2013-02-24 (×17): qty 15

## 2013-02-24 MED ORDER — DILTIAZEM HCL 25 MG/5ML IV SOLN
25.0000 mg | Freq: Once | INTRAVENOUS | Status: DC
  Filled 2013-02-24: qty 5

## 2013-02-24 MED ORDER — HYDROCODONE-ACETAMINOPHEN 5-325 MG PO TABS: 1.0000 | ORAL_TABLET | ORAL | Status: DC | PRN

## 2013-02-24 MED ORDER — INFLUENZA VAC SPLIT QUAD 0.5 ML IM SUSP
0.5000 mL | INTRAMUSCULAR | Status: AC
  Administered 2013-02-25: 0.5 mL via INTRAMUSCULAR
  Filled 2013-02-24: qty 0.5

## 2013-02-24 MED ORDER — HYDRALAZINE HCL 50 MG PO TABS
100.0000 mg | ORAL_TABLET | Freq: Four times a day (QID) | ORAL | Status: DC
  Administered 2013-02-24: 100 mg via ORAL
  Filled 2013-02-24 (×4): qty 2
  Filled 2013-02-24: qty 4

## 2013-02-24 MED ORDER — ALLOPURINOL 300 MG PO TABS: 300.0000 mg | ORAL_TABLET | Freq: Every day | ORAL | Status: DC

## 2013-02-24 MED ORDER — INSULIN ASPART 100 UNIT/ML ~~LOC~~ SOLN
0.0000 [IU] | SUBCUTANEOUS | Status: DC
  Administered 2013-02-24 – 2013-02-25 (×2): 4 [IU] via SUBCUTANEOUS
  Administered 2013-02-25 (×2): 3 [IU] via SUBCUTANEOUS

## 2013-02-24 MED ORDER — ACETAMINOPHEN 650 MG RE SUPP: 650.0000 mg | Freq: Four times a day (QID) | RECTAL | Status: DC | PRN

## 2013-02-24 MED ORDER — PROMETHAZINE HCL 25 MG/ML IJ SOLN
25.0000 mg | Freq: Four times a day (QID) | INTRAMUSCULAR | Status: DC | PRN
  Administered 2013-02-24 – 2013-03-11 (×2): 25 mg via INTRAVENOUS
  Filled 2013-02-24 (×2): qty 1

## 2013-02-24 MED ORDER — ALUM & MAG HYDROXIDE-SIMETH 200-200-20 MG/5ML PO SUSP: 30.0000 mL | Freq: Four times a day (QID) | ORAL | Status: DC | PRN

## 2013-02-24 MED ORDER — INSULIN ASPART 100 UNIT/ML ~~LOC~~ SOLN
0.0000 [IU] | Freq: Three times a day (TID) | SUBCUTANEOUS | Status: DC
  Administered 2013-02-24: 3 [IU] via SUBCUTANEOUS

## 2013-02-24 MED ORDER — METOPROLOL TARTRATE 1 MG/ML IV SOLN
5.0000 mg | INTRAVENOUS | Status: DC
  Administered 2013-02-24 – 2013-02-25 (×4): 5 mg via INTRAVENOUS
  Filled 2013-02-24 (×4): qty 5

## 2013-02-24 MED ORDER — HYDRALAZINE HCL 20 MG/ML IJ SOLN
20.0000 mg | Freq: Four times a day (QID) | INTRAMUSCULAR | Status: DC
  Administered 2013-02-24 – 2013-02-25 (×3): 20 mg via INTRAVENOUS
  Filled 2013-02-24 (×3): qty 1

## 2013-02-24 MED ORDER — INSULIN ASPART 100 UNIT/ML ~~LOC~~ SOLN: 0.0000 [IU] | Freq: Every day | SUBCUTANEOUS | Status: DC

## 2013-02-24 MED ORDER — BIOTENE DRY MOUTH MT LIQD
15.0000 mL | Freq: Four times a day (QID) | OROMUCOSAL | Status: DC
  Administered 2013-02-24 – 2013-03-01 (×13): 15 mL via OROMUCOSAL

## 2013-02-24 MED ORDER — CLONIDINE HCL 0.2 MG PO TABS: 0.2000 mg | ORAL_TABLET | Freq: Two times a day (BID) | ORAL | Status: DC

## 2013-02-24 MED ORDER — ETOMIDATE 2 MG/ML IV SOLN
INTRAVENOUS | Status: DC | PRN
  Administered 2013-02-24: 16 mg via INTRAVENOUS

## 2013-02-24 MED ORDER — FENTANYL CITRATE 0.05 MG/ML IJ SOLN
50.0000 ug | INTRAMUSCULAR | Status: DC | PRN
Start: 2013-02-24 — End: 2013-02-28
  Administered 2013-02-24 – 2013-02-25 (×2): 100 ug via INTRAVENOUS
  Administered 2013-02-25: 50 ug via INTRAVENOUS
  Administered 2013-02-25: 100 ug via INTRAVENOUS
  Administered 2013-02-26: 50 ug via INTRAVENOUS
  Administered 2013-02-26 (×2): 100 ug via INTRAVENOUS
  Administered 2013-02-27: 50 ug via INTRAVENOUS
  Administered 2013-02-27: 100 ug via INTRAVENOUS
  Administered 2013-02-27: 50 ug via INTRAVENOUS
  Administered 2013-02-27: 100 ug via INTRAVENOUS
  Administered 2013-02-28 (×3): 50 ug via INTRAVENOUS
  Filled 2013-02-24 (×14): qty 2

## 2013-02-24 MED ORDER — FUROSEMIDE 10 MG/ML IJ SOLN: 40.0000 mg | Freq: Four times a day (QID) | INTRAMUSCULAR | Status: DC

## 2013-02-24 MED ORDER — ALBUTEROL SULFATE (5 MG/ML) 0.5% IN NEBU: 5.0000 mg | INHALATION_SOLUTION | Freq: Once | RESPIRATORY_TRACT | Status: DC

## 2013-02-24 MED ORDER — ACETAMINOPHEN 325 MG PO TABS
650.0000 mg | ORAL_TABLET | Freq: Four times a day (QID) | ORAL | Status: DC | PRN
  Administered 2013-03-06 – 2013-03-20 (×9): 650 mg via ORAL
  Filled 2013-02-24 (×10): qty 2

## 2013-02-24 MED ORDER — POLYETHYLENE GLYCOL 3350 17 G PO PACK: 17.0000 g | PACK | Freq: Every day | ORAL | Status: DC | PRN

## 2013-02-24 MED ORDER — BISACODYL 5 MG PO TBEC: 5.0000 mg | DELAYED_RELEASE_TABLET | Freq: Every day | ORAL | Status: DC | PRN

## 2013-02-24 MED ORDER — MIDAZOLAM HCL 2 MG/2ML IJ SOLN
2.0000 mg | INTRAMUSCULAR | Status: DC | PRN
  Administered 2013-02-24 – 2013-02-25 (×2): 4 mg via INTRAVENOUS
  Administered 2013-02-27 (×2): 2 mg via INTRAVENOUS
  Filled 2013-02-24 (×2): qty 2
  Filled 2013-02-24 (×2): qty 4

## 2013-02-24 MED ORDER — FUROSEMIDE 10 MG/ML IJ SOLN
40.0000 mg | Freq: Once | INTRAMUSCULAR | Status: AC
  Administered 2013-02-24: 40 mg via INTRAVENOUS
  Filled 2013-02-24: qty 4

## 2013-02-24 MED ORDER — ONDANSETRON HCL 4 MG/2ML IJ SOLN
4.0000 mg | Freq: Four times a day (QID) | INTRAMUSCULAR | Status: DC | PRN
  Administered 2013-02-24: 4 mg via INTRAVENOUS
  Filled 2013-02-24: qty 2

## 2013-02-24 MED ORDER — ENOXAPARIN SODIUM 40 MG/0.4ML ~~LOC~~ SOLN: 40.0000 mg | SUBCUTANEOUS | Status: DC

## 2013-02-24 MED ORDER — FUROSEMIDE 10 MG/ML IJ SOLN: 40.0000 mg | Freq: Two times a day (BID) | INTRAMUSCULAR | Status: DC

## 2013-02-24 MED ORDER — CARVEDILOL 12.5 MG PO TABS: 25.0000 mg | ORAL_TABLET | Freq: Two times a day (BID) | ORAL | Status: DC

## 2013-02-24 MED ORDER — SUCCINYLCHOLINE CHLORIDE 20 MG/ML IJ SOLN
INTRAMUSCULAR | Status: DC | PRN
  Administered 2013-02-24: 140 mg via INTRAVENOUS

## 2013-02-24 MED ORDER — ALPRAZOLAM 0.5 MG PO TABS: 0.5000 mg | ORAL_TABLET | Freq: Two times a day (BID) | ORAL | Status: DC

## 2013-02-24 NOTE — H&P (Addendum)
Patient seen, independently examined and chart reviewed. I agree with exam, assessment and plan discussed with Toya Smothers, NP.  Very pleasant 57 year old woman with history of diastolic heart failure, oxygen dependent COPD, obesity hypoventilation syndrome, obstructive sleep apnea, supraventricular tachycardia, super obesity who presented to the hospital for the second time in 4 days for rapid heart rate. 10/3 she required adenosine x2 and then Cardizem bolus before conversion but was discharged home. Today she was placed on BiPAP and she spontaneously converted to sinus rhythm without medications.  She reports increasing shortness of breath over the last several weeks to the point now where she is wearing CPAP 24 hours a day. She has had no significant cough, no chest pain. She does have chronic lower extremity edema right greater than left with perhaps some worsening.  Currently she appears calm and comfortable. Morbidly obese. Lungs are clear to auscultation. Normal heart sounds. Lower extremities right greater than left edema, nontender, no erythema.  Laboratory studies notable for negative troponin, BNP 938, chronic kidney disease appears at baseline, chronic anemia appears at baseline. Chest x-ray suggested mild edema. First EKG showed supraventricular tachycardia rate 155. Second EKG showed sinus rhythm with no acute changes.  I suspect her recurrent SVT has been driven by her declining respiratory status, the etiology of which is not completely clear but workup thus far and her history suggests acute on chronic diastolic failure. She has no signs or symptoms to suggest acute coronary syndrome. Will admit for diuresis, cardiac enzymes, cardiology consultation. Given her improvement on BiPAP Will admit to step down unit. However  she is currently stable on nasal cannula do not think ABG would add further information at this point.  Brendia Sacks, MD [facility] [phone]

## 2013-02-24 NOTE — Progress Notes (Signed)
Patient O2 Sats 80's at shift report. Mid level notified as patient was vomiting and unable to keep Bipap on. Order was given for Phenergan and KUB. Patient stopped vomiting but became lethargic. O2 sats continued to be low with Bipap in place. Sats were in the 80's. HR was variable and E-Link was notified. Dr. Delford Field placed order for Intubation and ventilation. Dr. Orvan Falconer came to floor and spoke with E-Link. Husband was called but not able to be reached. Daughter was then called.

## 2013-02-24 NOTE — Progress Notes (Signed)
CRITICAL VALUE ALERT  Critical value received:  ABG pH 7.1, CO2 103, O2 70, Bicarb 34  Date of notification:  02/24/2013  Time of notification:  20:31  Critical value read back:yes  Nurse who received alert:  Allesandra Huebsch  MD notified (1st page):  E Link  Time of first page:  20:32  MD notified (2nd page):  Time of second page:  Responding MD:  Dr. Delford Field  Time MD responded:  20:32

## 2013-02-24 NOTE — Progress Notes (Signed)
Shift event: RN paged this NP secondary to pt having n/v refractory to zofran. Pt needed bipap for ARF and was unable to use secondary to the n/v. Ordered Phenergan and KUB (given there was nothing on chart to support her vomiting) and told RN to call me back if unable to place Bipap. RN paged me again a few minutes later stating no further vomiting but even on Bipap, pt was satting in the 80s. Respiratory was at bedside. Stat ABG ordered. This NP not in house at [facility], so called Dr. Vedia Coffer to report pt's decline. He stated he would go to bedside. NP called RN back and ABG results warranted intubation. Dr. Orvan Falconer was at bedside and Elink was involved. [name] Norman, NP [facility]

## 2013-02-24 NOTE — Anesthesia Procedure Notes (Signed)
Procedure Name: Intubation Date/Time: 02/24/2013 9:07 PM Performed by: Franco Nones Pre-anesthesia Checklist: Patient identified, Emergency Drugs available, Suction available, Patient being monitored and Timeout performed Patient Re-evaluated:Patient Re-evaluated prior to inductionOxygen Delivery Method: Ambu bag Preoxygenation: Pre-oxygenation with 100% oxygen Intubation Type: IV induction, Rapid sequence and Cricoid Pressure applied Laryngoscope Size: Mac and 4 Grade View: Grade I Tube type: Subglottic suction tube Tube size: 7.5 mm Number of attempts: 1 Airway Equipment and Method: Stylet Placement Confirmation: ETT inserted through vocal cords under direct vision,  CO2 detector and breath sounds checked- equal and bilateral Secured at: 21 cm Dental Injury: Teeth and Oropharynx as per pre-operative assessment

## 2013-02-24 NOTE — ED Provider Notes (Signed)
CSN: [id number]     Arrival date & time 02/24/13  1143 History   This chart was scribed for [name] Camel, MD, by Yevette Edwards, ED Scribe. This patient was seen in room [facility] and the patient's care was started at 11:53 AM. First MD Initiated Contact with Patient 02/24/13 1148     Chief Complaint  Patient presents with  . Chest Pain    The history is provided by the patient. No language interpreter was used.   HPI Comments: [name] is a 57 y.o. female, with a h/o supraventricular tachycardia and DM, who presents to the Emergency Department via EMS complaining of palpitations which began this morning. She states that she was twenty minutes late in taking her medication. She denies any improvement with vagaling. The pt also states she has experienced a cough and SOB as associated symptoms. She reports she has experienced SOB intermittently for three days, but she has found improvement by using her CPAP. The cough has been occurring intermittently for four days, but only became productive today. The pt was treated in the ED four days ago for similar palpitations. The pt denies any chest pain, fever, chills, or any recent sickness. She reports that one of her sisters has the flu while another sister has pneumonia.    Past Medical History  Diagnosis Date  . Morbid obesity   . Essential hypertension, benign   . Chronic diastolic heart failure     LVEF 60-65% 2011  . COPD (chronic obstructive pulmonary disease)     Oxygen dependent  . History of cardiac catheterization     Normal coronary arteries 2008  . Obesity hypoventilation syndrome   . Arthritis   . Hiatal hernia   . Symptomatic bradycardia     With pauses 2008 - Followed by Dr. Graciela Husbands  . Cellulitis     Recurrent bilateral LE  . Mixed hyperlipidemia   . Peripheral vascular disease   . Myocardial infarction 1990's  . Sleep apnea   . Type II diabetes mellitus   . Gout   . Nonsustained ventricular tachycardia      Documented 2008  . SVT (supraventricular tachycardia)     Reported possible atrial arrhythmias   Past Surgical History  Procedure Laterality Date  . Abdominal hysterectomy  1980's  . Insert / replace / remove pacemaker  2010   Family History  Problem Relation Age of Onset  . Coronary artery disease Mother     MI age 63, died.  . Hypertension Father   . Osteoarthritis Father   . Hypertension Sister   . Hypertension Brother   . Obesity Father    History  Substance Use Topics  . Smoking status: Former Smoker -- 0.50 packs/day for .1 years    Types: Cigarettes    Quit date: 05/22/1967  . Smokeless tobacco: Never Used  . Alcohol Use: No   OB History   Grav Para Term Preterm Abortions TAB SAB Ect Mult Living   5 5 5       5      Review of Systems  Constitutional: Negative for fever and chills.  HENT: Negative for congestion.   Respiratory: Positive for cough and shortness of breath.   Cardiovascular: Negative for chest pain.  All other systems reviewed and are negative.    Allergies  Morphine and related  Home Medications   Current Outpatient Rx  Name  Route  Sig  Dispense  Refill  . albuterol (PROVENTIL) (2.5 MG/3ML) 0.083%  nebulizer solution   Nebulization   Take 2.5 mg by nebulization every 6 (six) hours as needed. For shortness of breath         . allopurinol (ZYLOPRIM) 300 MG tablet   Oral   Take 300 mg by mouth daily.         Marland Kitchen ALPRAZolam (XANAX) 1 MG tablet   Oral   Take 1 mg by mouth at bedtime as needed for sleep.         Marland Kitchen EXPIRED: carvedilol (COREG) 25 MG tablet   Oral   Take 1 tablet (25 mg total) by mouth 2 (two) times daily with a meal.   180 tablet   3   . EXPIRED: cloNIDine (CATAPRES) 0.2 MG tablet   Oral   Take 0.2 mg by mouth 2 (two) times daily.         Marland Kitchen diltiazem (CARDIZEM) 30 MG tablet   Oral   Take 1 tablet (30 mg total) by mouth every 6 (six) hours.   120 tablet   12   . doxycycline (VIBRA-TABS) 100 MG tablet    Oral   Take 1 tablet (100 mg total) by mouth 2 (two) times daily.   20 tablet   1   . doxycycline (VIBRAMYCIN) 100 MG capsule   Oral   Take 1 capsule (100 mg total) by mouth 2 (two) times daily.   20 capsule   0   . ferrous sulfate 325 (65 FE) MG tablet   Oral   Take 325 mg by mouth daily.           Marland Kitchen EXPIRED: hydrALAZINE (APRESOLINE) 100 MG tablet   Oral   Take 100 mg by mouth 4 (four) times daily.         . insulin lispro protamine-insulin lispro (HUMALOG 75/25) (75-25) 100 UNIT/ML SUSP   Subcutaneous   Inject 20 Units into the skin 3 (three) times daily.          . Multiple Vitamins-Minerals (MULTIVITAMINS THER. W/MINERALS) TABS   Oral   Take 1 tablet by mouth daily.           Marland Kitchen neomycin-bacitracin-polymyxin (NEOSPORIN) [phone] ointment      APPLY TO YOUR LEGS ON EVERY Tuesday, Thursday, AND Saturday.   28.3 g   0   . nystatin (MYCOSTATIN) cream      APPLY TO YOUR SKIN FOLDS AND LEGS EVERY Monday, Wednesday , AND Friday.   30 g   1   . spironolactone (ALDACTONE) 25 MG tablet   Oral   Take 25 mg by mouth daily.         Marland Kitchen sulfamethoxazole-trimethoprim (BACTRIM DS) 800-160 MG per tablet   Oral   Take 1 tablet by mouth 2 (two) times daily.   20 tablet   1   . torsemide (DEMADEX) 100 MG tablet   Oral   Take 50 mg by mouth 2 (two) times daily.         . vitamin C (ASCORBIC ACID) 500 MG tablet   Oral   Take 500 mg by mouth daily.          Triage Vitals: BP 121/96  Pulse 153  Temp(Src) 98.5 F (36.9 C) (Oral)  Resp 24  Ht 5' (1.524 m)  Wt 381 lb (172.82 kg)  BMI 74.41 kg/m2  SpO2 98%  Physical Exam  Nursing note and vitals reviewed. Constitutional: She is oriented to person, place, and time. She appears well-developed and well-nourished. No distress.  Morbidly obese  HENT:  Head: Normocephalic and atraumatic.  Neck: Neck supple. No tracheal deviation present.  Cardiovascular: Regular rhythm and normal heart sounds.  Tachycardia  present.   Pulmonary/Chest: Effort normal. No respiratory distress.  Abdominal: There is no tenderness.  Musculoskeletal: Normal range of motion.  Neurological: She is alert and oriented to person, place, and time.  Skin: Skin is warm and dry.  Psychiatric: She has a normal mood and affect. Her behavior is normal.    ED Course  Procedures (including critical care time)  DIAGNOSTIC STUDIES: Oxygen Saturation is 98% on [place], normal by my interpretation.    COORDINATION OF CARE:  11:58 AM- Discussed treatment plan with patient, and the patient agreed to the plan.   Labs Reviewed  CBC WITH DIFFERENTIAL - Abnormal; Notable for the following:    WBC 11.6 (*)    RBC 3.40 (*)    Hemoglobin 9.9 (*)    HCT 31.1 (*)    RDW 17.7 (*)    Neutro Abs 8.8 (*)    All other components within normal limits  BASIC METABOLIC PANEL - Abnormal; Notable for the following:    CO2 34 (*)    Glucose, Bld 183 (*)    Creatinine, Ser 1.33 (*)    GFR calc non Af Amer 43 (*)    GFR calc Af Amer 50 (*)    All other components within normal limits  PRO B NATRIURETIC PEPTIDE - Abnormal; Notable for the following:    Pro B Natriuretic peptide (BNP) 938.5 (*)    All other components within normal limits  TROPONIN I   Imaging Review Dg Chest Port 1 View  02/24/2013   CLINICAL DATA:  Dyspnea, cough, palpitations  EXAM: PORTABLE CHEST - 1 VIEW  COMPARISON:  03/03/2012  FINDINGS: Cardiomegaly again noted. Dual lead cardiac pacemaker is unchanged in position. Central mild vascular congestion and mild interstitial prominence bilaterally suspicious for mild interstitial edema. No segmental infiltrate. Mild left basilar atelectasis.  IMPRESSION: Central mild vascular congestion and mild interstitial prominence bilaterally suspicious for mild interstitial edema. No segmental infiltrate. Mild left basilar atelectasis.   Electronically Signed   By: Natasha Mead M.D.   On: 02/24/2013 13:04    Date: 02/24/2013  Rate: 155   Rhythm: supraventricular tachycardia (SVT)  QRS Axis: normal  Intervals: PR prolonged  ST/T Wave abnormalities: nonspecific ST/T changes  Conduction Disutrbances:none  Narrative Interpretation:   Old EKG Reviewed: changes noted   MDM   1. SVT (supraventricular tachycardia)   2. Heart failure    Patient with second episode of SVT in a few days. She has not had issues with this in several months to over a year before this time. She supposedly wears CPAP 24 hours a day. When she was placed on BiPAP due to this her SVT resolved without medications. Given that her x-ray is concerning for pulmonary edema BNP was evaluated which is elevated. Suggest with cardiology on-call, Dr. Wyline Mood, who recommends admission to hospitalist. She's otherwise well appearing and is back in sinus rhythm. She went BiPAP only because of her chronic issues not in acute dyspnea. Will admit to the hospitalist for IV diuresis and CHF management.  I personally performed the services described in this documentation, which was scribed in my presence. The recorded information has been reviewed and is accurate.    [name] Camel, MD 02/24/13 [id number]

## 2013-02-24 NOTE — ED Notes (Signed)
Patient HR now 89-91. Dr Criss Alvine aware-Cardizem held.

## 2013-02-24 NOTE — H&P (Signed)
[facility] History and Physical  [name] ZOX:[id number] DOB: Jan 13, 1956 DOA: 02/24/2013  Referring physician:  PCP: Fredirick Maudlin, MD  Specialists:   Chief Complaint:   HPI: [name] is a 57 y.o. female with past medical history that includes hypertension, chronic diastolic heart failure, COPD, SVT, diabetes, sleep apnea, chronic recurrent lower extremity cellulitis, obesity hypoventilation syndrome, presents to the emergency department with chief complaint of palpitations. Information is obtained from the patient and her caregiver who is at the bedside. Patient reports sudden onset of palpitations which began this morning. She indicates that she was at least 20 minutes late in taking her Cardizem and she believes this is why she had her reoccurrence of palpitations. Patient also reports that she feels like "I've been taking a cold" with cough and worsening shortness of breath as indicators. Associated symptoms include left anterior nonradiating chest pressure.  She denies any fever chills nausea vomiting. She states this has been going on for several days. Of note she was treated in the emergency room Department 3 days ago with the same symptoms. At that time she was given adenosine x2 and there was transient returned to sinus rhythm which reverted to SVT. She was then given Cardizem IV and she converted to normal sinus rhythm. Today in the emergency department she converted to normal sinus once she was placed on BiPAP. She reports that she has been wearing her CPAP machine at home pretty much 24 hours a day for the last several weeks. She reports anxiety about her respiratory status and therefore wears her CPAP on an as-needed basis. The caregiver reports that this has evolved into wearing CPAP 24 hours a day. He EMS reports today that when CPAP was removed her oxygen saturation level dropped to 80%. Workup in the emergency room include lab work significant for a creatinine of  1.3, proBNP of 938.5, white count of 11.6, hemoglobin 9.9, serum glucose 183 and negative troponin. Chest x-ray U. Central mild vascular congestion and mild interstitial prominence bilaterally suspicious for mild interstitial edema. No segmental infiltrate. Mild left basilar atelectasis. EKG yields CT with nonspecific ST and T wave changes. Asked to admit    Review of Systems: 10 point review of systems complete and all systems are negative except as indicated in the history of present illness  Past Medical History  Diagnosis Date  . Morbid obesity   . Essential hypertension, benign   . Chronic diastolic heart failure     LVEF 60-65% 2011  . COPD (chronic obstructive pulmonary disease)     Oxygen dependent  . History of cardiac catheterization     Normal coronary arteries 2008  . Obesity hypoventilation syndrome   . Arthritis   . Hiatal hernia   . Symptomatic bradycardia     With pauses 2008 - Followed by Dr. Graciela Husbands  . Cellulitis     Recurrent bilateral LE  . Mixed hyperlipidemia   . Peripheral vascular disease   . Myocardial infarction 1990's  . Sleep apnea   . Type II diabetes mellitus   . Gout   . Nonsustained ventricular tachycardia     Documented 2008  . SVT (supraventricular tachycardia)     Reported possible atrial arrhythmias   Past Surgical History  Procedure Laterality Date  . Abdominal hysterectomy  1980's  . Insert / replace / remove pacemaker  2010   Social History:  reports that she quit smoking about 45 years ago. Her smoking use included Cigarettes. She has a .  05 pack-year smoking history. She has never used smokeless tobacco. She reports that she does not drink alcohol or use illicit drugs. This patient is morbidly obese and has been bedbound for 4 years. She has 24 7 care and is completely dependent with ADA  Allergies  Allergen Reactions  . Morphine And Related Hives    Family History  Problem Relation Age of Onset  . Coronary artery disease Mother      MI age 28, died.  . Hypertension Father   . Osteoarthritis Father   . Hypertension Sister   . Hypertension Brother   . Obesity Father      Prior to Admission medications   Medication Sig Start Date End Date Taking? Authorizing Provider  albuterol (PROVENTIL) (2.5 MG/3ML) 0.083% nebulizer solution Take 2.5 mg by nebulization every 6 (six) hours as needed. For shortness of breath   Yes Historical Provider, MD  allopurinol (ZYLOPRIM) 300 MG tablet Take 300 mg by mouth daily.   Yes Historical Provider, MD  ALPRAZolam [name]) 0.5 MG tablet Take 0.5 mg by mouth 2 (two) times daily.   Yes Historical Provider, MD  carvedilol (COREG) 25 MG tablet Take 25 mg by mouth 2 (two) times daily.   Yes Historical Provider, MD  cloNIDine (CATAPRES) 0.2 MG tablet Take 0.2 mg by mouth 2 (two) times daily.   Yes Historical Provider, MD  diltiazem (CARDIZEM) 30 MG tablet Take 1 tablet (30 mg total) by mouth every 6 (six) hours. 03/04/12  Yes Fredirick Maudlin, MD  ferrous sulfate 325 (65 FE) MG tablet Take 325 mg by mouth daily.     Yes Historical Provider, MD  hydrALAZINE (APRESOLINE) 100 MG tablet Take 100 mg by mouth 4 (four) times daily.   Yes Historical Provider, MD  insulin lispro protamine-insulin lispro (HUMALOG 75/25) (75-25) 100 UNIT/ML SUSP Inject 20 Units into the skin 3 (three) times daily.  04/02/11  Yes Elliot Cousin, MD  Multiple Vitamins-Minerals (MULTIVITAMINS THER. W/MINERALS) TABS Take 1 tablet by mouth daily.     Yes Historical Provider, MD  neomycin-bacitracin-polymyxin (NEOSPORIN) [phone] ointment APPLY TO YOUR LEGS ON EVERY Tuesday, Thursday, AND Saturday. 04/02/11  Yes Elliot Cousin, MD  nystatin (MYCOSTATIN) cream APPLY TO YOUR SKIN FOLDS AND LEGS EVERY Monday, Wednesday , AND Friday. 04/02/11  Yes Elliot Cousin, MD  spironolactone (ALDACTONE) 25 MG tablet Take 25 mg by mouth daily.   Yes Historical Provider, MD  torsemide (DEMADEX) 100 MG tablet Take 50 mg by mouth 2 (two) times  daily.   Yes Historical Provider, MD  vitamin C (ASCORBIC ACID) 500 MG tablet Take 500 mg by mouth daily.   Yes Historical Provider, MD   Physical Exam: Filed Vitals:   02/24/13 1520  BP: 154/75  Pulse: 91  Temp:   Resp: 24     General:  Morbidly obese, no acute  Eyes: PE RRL, EOMI, no scleral icterus  ENT: Clear nose without drainage oropharynx without erythema or exudate. Mucous membranes of her mouth are pink slightly dry  Neck: Supple no JVD full range of motion  Cardiovascular: Regular rate and rhythm heart sounds are distant no murmur noted 2+ lower extremity edema  Respiratory: Moderate increased work of breathing with respirations. BiPAP mask intact. Sounds very difficult to assess due to body habitus. Workup here was distant and coarse  Abdomen: Mildly obese soft positive bowel sounds nontender to palpation  Skin: Warm and dry no rashes or lesions noted  Musculoskeletal: Joints without swelling or edema. Nontender to  palpate  Psychiatric: Pleasant calm core  Neurologic: Numerous 2 through 12 grossly speech clear facial some  Labs on Admission:  Basic Metabolic Panel:  Recent Labs Lab 02/20/13 0047 02/24/13 1209  NA 138 142  K 3.5 4.4  CL 95* 98  CO2 31 34*  GLUCOSE 171* 183*  BUN 25* 19  CREATININE 1.72* 1.33*  CALCIUM 9.1 9.6   Liver Function Tests: No results found for this basename: AST, ALT, ALKPHOS, BILITOT, PROT, ALBUMIN,  in the last 168 hours No results found for this basename: LIPASE, AMYLASE,  in the last 168 hours No results found for this basename: AMMONIA,  in the last 168 hours CBC:  Recent Labs Lab 02/20/13 0047 02/24/13 1209  WBC 10.0 11.6*  NEUTROABS 6.8 8.8*  HGB 10.3* 9.9*  HCT 32.4* 31.1*  MCV 90.3 91.5  PLT 285 296   Cardiac Enzymes:  Recent Labs Lab 02/20/13 0047 02/24/13 1209  TROPONINI <0.30 <0.30    BNP (last 3 results)  Recent Labs  02/24/13 1341  PROBNP 938.5*   CBG: No results found for this  basename: GLUCAP,  in the last 168 hours  Radiological Exams on Admission: Dg Chest Port 1 View  02/24/2013   CLINICAL DATA:  Dyspnea, cough, palpitations  EXAM: PORTABLE CHEST - 1 VIEW  COMPARISON:  03/03/2012  FINDINGS: Cardiomegaly again noted. Dual lead cardiac pacemaker is unchanged in position. Central mild vascular congestion and mild interstitial prominence bilaterally suspicious for mild interstitial edema. No segmental infiltrate. Mild left basilar atelectasis.  IMPRESSION: Central mild vascular congestion and mild interstitial prominence bilaterally suspicious for mild interstitial edema. No segmental infiltrate. Mild left basilar atelectasis.   Electronically Signed   By: Natasha Mead M.D.   On: 02/24/2013 13:04    EKG: Independently reviewed. Time of my exam patient in normal sinus rhythm  Assessment/Plan Principal Problem:   Acute on chronic diastolic heart failure: Likely related to arrhythmias in the setting of possible worsening respiratory status. Will admit to step down. Will continue with BiPAP and attempt to wean as able. Will continue Lasix IV. Will monitor her intake and output and obtain a daily weight. Will also repeat a 2-D echo as the most recent was 2013 with the EF of 60-65% and grade 2 diastolic dysfunction. Will also cycle her cardiac enzymes repeat an EKG in the morning and request a cardiology consult. Active Problems: Acute respiratory failure: Echo related to recurrent episodes of SVT in the setting of chronic diastolic heart failure. Patient's baseline is CPAP at night only. Over the last several weeks he's become increasingly anxious About her respiratory status and has been wearing the CPAP 24 hours a day. EMS reports that at transport limit to CPAP off oxygen saturation dropped to 80%. Will continue BiPAP and provide nebs as needed. See #1   SVT (supraventricular tachycardia): Patient with history of recurrent episodes. Most recent 3 days ago. She reports her leg  and medication is the usual trigger. 3 days ago in the emergency department she converted back to normal sinus rhythm after an IV dose of Cardizem. Today she converted back into normal sinus rhythm after BiPAP was applied. We will cycle her cardiac enzymes continue her Cardizem check a TSH and request a cardiology consult. At the time of my exam patient was still in normal sinus rhythm at a rate of 84. Will continue her home Cardizem and beta blocker.  HYPERTENSION: Fair control. Continue home Coreg, Catapres, Cardizem, Apresoline  Chronic kidney disease. Current  creatinine level is 1.3. Creatinine a year ago was 1.13. Will monitor closely given IV Lasix as required for treatment for problem #1.      DM (diabetes mellitus), type 2, uncontrolled with complications: Will check a hemoglobin A1c. Will provide sliding scale insulin from optimal glycemic control.    HYPERLIPIDEMIA: Will check a lipid panel. Will continue home medications    OBESITY, MORBID: PMI 74.6. Will request a nutritional consult.   Anemia, iron deficiency    SLEEP APNEA, OBSTRUCTIVE: See #2.      CORONARY ARTERY DISEASE: See #3.    GERD: Stable at baseline.    PACEMAKER, PERMANENT   Obesity hypoventilation syndrome  Cardiology Code Status: full Family Communication: father at bedside Disposition Plan: home when ready  Time spent: 67 minutes  Gwenyth Bender [facility] Pager [phone]  If 7PM-7AM, please contact night-coverage [email or web address] Password [email or web address] 02/24/2013, 4:03 PM

## 2013-02-24 NOTE — Progress Notes (Signed)
eLink Physician-Brief Progress Note Patient Name: [name] DOB: 1956/04/30 MRN: [id number]  Date of Service  02/24/2013   HPI/Events of Note   Respiratory failure   eICU Interventions  Asked EDP to intubate.   See orders   Intervention Category Major Interventions: Respiratory failure - evaluation and management  [name] 02/24/2013, 8:30 PM

## 2013-02-24 NOTE — ED Notes (Signed)
Patient brought in via EMS. Patient alert and oriented. Patient c/o chest pressure with shortness of breath. Patient brought in on non-rebreather. Per EMS patient wera b-pap 24 hours a day. Patient recently admitted for tachycardia given adenosine and released 2 days ago. Per EMS patient's HR 150

## 2013-02-25 ENCOUNTER — Inpatient Hospital Stay (HOSPITAL_COMMUNITY): Payer: Medicaid Other

## 2013-02-25 DIAGNOSIS — I498 Other specified cardiac arrhythmias: Secondary | ICD-10-CM

## 2013-02-25 DIAGNOSIS — I5033 Acute on chronic diastolic (congestive) heart failure: Secondary | ICD-10-CM

## 2013-02-25 LAB — BLOOD GAS, ARTERIAL
Acid-Base Excess: 8.3 mmol/L — ABNORMAL HIGH (ref 0.0–2.0)
Drawn by: 23534
FIO2: 0.4 %
MECHVT: 400 mL
O2 Content: 40 L/min
O2 Saturation: 96.2 %
PEEP: 8 cmH2O
Patient temperature: 37
RATE: 22 resp/min
TCO2: 30.1 mmol/L (ref 0–100)

## 2013-02-25 LAB — BASIC METABOLIC PANEL
BUN: 21 mg/dL (ref 6–23)
CO2: 34 mEq/L — ABNORMAL HIGH (ref 19–32)
Calcium: 9.4 mg/dL (ref 8.4–10.5)
Chloride: 97 mEq/L (ref 96–112)
Creatinine, Ser: 1.45 mg/dL — ABNORMAL HIGH (ref 0.50–1.10)
GFR calc Af Amer: 45 mL/min — ABNORMAL LOW (ref >90)
Glucose, Bld: 138 mg/dL — ABNORMAL HIGH (ref 70–99)
Sodium: 140 mEq/L (ref 135–145)

## 2013-02-25 LAB — CBC
MCHC: 31.5 g/dL (ref 30.0–36.0)
MCV: 91.2 fL (ref 78.0–100.0)
Platelets: 286 10*3/uL (ref 150–400)
RBC: 3.17 MIL/uL — ABNORMAL LOW (ref 3.87–5.11)
RDW: 17.7 % — ABNORMAL HIGH (ref 11.5–15.5)
WBC: 11.1 10*3/uL — ABNORMAL HIGH (ref 4.0–10.5)

## 2013-02-25 LAB — GLUCOSE, CAPILLARY
Glucose-Capillary: 145 mg/dL — ABNORMAL HIGH (ref 70–99)
Glucose-Capillary: 130 mg/dL — ABNORMAL HIGH (ref 70–99)
Glucose-Capillary: 132 mg/dL — ABNORMAL HIGH (ref 70–99)
Glucose-Capillary: 127 mg/dL — ABNORMAL HIGH (ref 70–99)

## 2013-02-25 LAB — HEMOGLOBIN A1C
Hgb A1c MFr Bld: 7.6 % — ABNORMAL HIGH (ref <5.7)
Mean Plasma Glucose: 171 mg/dL — ABNORMAL HIGH (ref <117)

## 2013-02-25 MED ORDER — INSULIN ASPART 100 UNIT/ML ~~LOC~~ SOLN
0.0000 [IU] | SUBCUTANEOUS | Status: DC
  Administered 2013-02-25 – 2013-02-26 (×8): 3 [IU] via SUBCUTANEOUS
  Administered 2013-02-27 (×6): 4 [IU] via SUBCUTANEOUS
  Administered 2013-02-28: 3 [IU] via SUBCUTANEOUS
  Administered 2013-02-28: 7 [IU] via SUBCUTANEOUS
  Administered 2013-02-28 (×2): 4 [IU] via SUBCUTANEOUS
  Administered 2013-02-28 (×2): 7 [IU] via SUBCUTANEOUS
  Administered 2013-02-28: 4 [IU] via SUBCUTANEOUS
  Administered 2013-03-01: 7 [IU] via SUBCUTANEOUS
  Administered 2013-03-01: 15 [IU] via SUBCUTANEOUS
  Administered 2013-03-01 (×2): 11 [IU] via SUBCUTANEOUS
  Administered 2013-03-01 – 2013-03-02 (×2): 15 [IU] via SUBCUTANEOUS
  Administered 2013-03-02 (×2): 11 [IU] via SUBCUTANEOUS
  Administered 2013-03-02 (×3): 15 [IU] via SUBCUTANEOUS
  Administered 2013-03-03: 11 [IU] via SUBCUTANEOUS
  Administered 2013-03-03 (×2): 15 [IU] via SUBCUTANEOUS
  Administered 2013-03-03 (×2): 11 [IU] via SUBCUTANEOUS
  Administered 2013-03-03: 15 [IU] via SUBCUTANEOUS
  Administered 2013-03-04: 11 [IU] via SUBCUTANEOUS
  Administered 2013-03-04: 7 [IU] via SUBCUTANEOUS
  Administered 2013-03-04 – 2013-03-05 (×4): 11 [IU] via SUBCUTANEOUS
  Administered 2013-03-05 (×2): 7 [IU] via SUBCUTANEOUS
  Administered 2013-03-05: 4 [IU] via SUBCUTANEOUS
  Administered 2013-03-05 – 2013-03-06 (×9): 7 [IU] via SUBCUTANEOUS
  Administered 2013-03-07 – 2013-03-08 (×6): 4 [IU] via SUBCUTANEOUS
  Administered 2013-03-08: 3 [IU] via SUBCUTANEOUS
  Administered 2013-03-08: 4 [IU] via SUBCUTANEOUS
  Administered 2013-03-08: 7 [IU] via SUBCUTANEOUS
  Administered 2013-03-08: 3 [IU] via SUBCUTANEOUS
  Administered 2013-03-08 – 2013-03-09 (×2): 4 [IU] via SUBCUTANEOUS
  Administered 2013-03-09 (×2): 3 [IU] via SUBCUTANEOUS
  Administered 2013-03-09 (×2): 4 [IU] via SUBCUTANEOUS
  Administered 2013-03-09 – 2013-03-10 (×2): 3 [IU] via SUBCUTANEOUS
  Administered 2013-03-10: 4 [IU] via SUBCUTANEOUS
  Administered 2013-03-10: 3 [IU] via SUBCUTANEOUS
  Administered 2013-03-10 (×2): 4 [IU] via SUBCUTANEOUS
  Administered 2013-03-10: 3 [IU] via SUBCUTANEOUS
  Administered 2013-03-10: 4 [IU] via SUBCUTANEOUS
  Administered 2013-03-11 (×2): 3 [IU] via SUBCUTANEOUS
  Administered 2013-03-11 – 2013-03-12 (×3): 4 [IU] via SUBCUTANEOUS
  Administered 2013-03-12 – 2013-03-13 (×3): 3 [IU] via SUBCUTANEOUS
  Administered 2013-03-13 (×2): 4 [IU] via SUBCUTANEOUS
  Administered 2013-03-13 – 2013-03-14 (×3): 3 [IU] via SUBCUTANEOUS
  Administered 2013-03-14: 4 [IU] via SUBCUTANEOUS
  Administered 2013-03-14: 3 [IU] via SUBCUTANEOUS
  Administered 2013-03-14 (×2): 4 [IU] via SUBCUTANEOUS
  Administered 2013-03-15 – 2013-03-16 (×10): 3 [IU] via SUBCUTANEOUS
  Administered 2013-03-16: 4 [IU] via SUBCUTANEOUS
  Administered 2013-03-16: 3 [IU] via SUBCUTANEOUS
  Administered 2013-03-17: 4 [IU] via SUBCUTANEOUS
  Administered 2013-03-17: 3 [IU] via SUBCUTANEOUS
  Administered 2013-03-17 (×3): 4 [IU] via SUBCUTANEOUS
  Administered 2013-03-17 – 2013-03-18 (×3): 3 [IU] via SUBCUTANEOUS
  Administered 2013-03-18: 4 [IU] via SUBCUTANEOUS
  Administered 2013-03-18: 3 [IU] via SUBCUTANEOUS
  Administered 2013-03-18: 4 [IU] via SUBCUTANEOUS
  Administered 2013-03-18 – 2013-03-19 (×2): 3 [IU] via SUBCUTANEOUS
  Administered 2013-03-19 (×2): 4 [IU] via SUBCUTANEOUS
  Administered 2013-03-19 – 2013-03-20 (×4): 3 [IU] via SUBCUTANEOUS
  Administered 2013-03-20: 4 [IU] via SUBCUTANEOUS
  Administered 2013-03-20: 3 [IU] via SUBCUTANEOUS
  Administered 2013-03-20 (×2): 4 [IU] via SUBCUTANEOUS
  Administered 2013-03-20 – 2013-03-21 (×4): 3 [IU] via SUBCUTANEOUS
  Administered 2013-03-21 (×2): 4 [IU] via SUBCUTANEOUS
  Administered 2013-03-21: 3 [IU] via SUBCUTANEOUS
  Administered 2013-03-22 (×3): 4 [IU] via SUBCUTANEOUS
  Administered 2013-03-22: 3 [IU] via SUBCUTANEOUS
  Administered 2013-03-22: 4 [IU] via SUBCUTANEOUS
  Administered 2013-03-22: 3 [IU] via SUBCUTANEOUS
  Administered 2013-03-22 – 2013-03-23 (×5): 4 [IU] via SUBCUTANEOUS
  Administered 2013-03-23 – 2013-03-24 (×3): 3 [IU] via SUBCUTANEOUS
  Administered 2013-03-24: 4 [IU] via SUBCUTANEOUS
  Administered 2013-03-24 – 2013-03-26 (×7): 3 [IU] via SUBCUTANEOUS
  Administered 2013-03-26: 4 [IU] via SUBCUTANEOUS
  Administered 2013-03-26 – 2013-03-27 (×3): 3 [IU] via SUBCUTANEOUS
  Administered 2013-03-27: 0 [IU] via SUBCUTANEOUS

## 2013-02-25 MED ORDER — GLUCERNA 1.2 CAL PO LIQD
1000.0000 mL | ORAL | Status: DC
  Administered 2013-02-25 – 2013-03-04 (×8): 1000 mL via OROGASTRIC
  Filled 2013-02-25 (×14): qty 1000

## 2013-02-25 MED ORDER — CLONIDINE HCL 0.2 MG PO TABS
0.2000 mg | ORAL_TABLET | Freq: Two times a day (BID) | ORAL | Status: DC
  Administered 2013-02-25 – 2013-03-02 (×10): 0.2 mg via ORAL
  Filled 2013-02-25 (×10): qty 1

## 2013-02-25 MED ORDER — DILTIAZEM HCL 30 MG PO TABS
30.0000 mg | ORAL_TABLET | Freq: Four times a day (QID) | ORAL | Status: DC
  Administered 2013-02-25 – 2013-03-10 (×49): 30 mg via ORAL
  Filled 2013-02-25 (×55): qty 1

## 2013-02-25 MED ORDER — CARVEDILOL 25 MG PO TABS
25.0000 mg | ORAL_TABLET | Freq: Two times a day (BID) | ORAL | Status: DC
  Administered 2013-02-25 – 2013-03-10 (×26): 25 mg via ORAL
  Filled 2013-02-25 (×3): qty 1
  Filled 2013-02-25: qty 2
  Filled 2013-02-25 (×4): qty 1
  Filled 2013-02-25: qty 2
  Filled 2013-02-25 (×3): qty 1
  Filled 2013-02-25 (×4): qty 2
  Filled 2013-02-25: qty 1
  Filled 2013-02-25: qty 2
  Filled 2013-02-25 (×2): qty 1
  Filled 2013-02-25: qty 2
  Filled 2013-02-25 (×2): qty 1
  Filled 2013-02-25 (×3): qty 2
  Filled 2013-02-25 (×2): qty 1
  Filled 2013-02-25 (×2): qty 2

## 2013-02-25 MED ORDER — HYDRALAZINE HCL 25 MG PO TABS: 100.0000 mg | ORAL_TABLET | Freq: Four times a day (QID) | ORAL | Status: DC

## 2013-02-25 NOTE — Progress Notes (Signed)
At time prior to intubation the monitor had reported Pacer not pacing, asystole, and bradycardia as low as 39bpm. This caused concern because nursing staff was being notified, and not expecting a paced patient to have such low bradycardia. E-link was notified when intubating, so they could track this as well. I printed several of the alarm strips and placed them in the paper chart for PCP or cardiology to review, but it looks like the pacer was firing on demand with HR approximately 80 in each strip.

## 2013-02-25 NOTE — Consult Note (Signed)
Reason for Consult:SVT Referring Physician: [name] is an 57 y.o. female.  HPI: A 57 year old morbidly obese patient who was previously followed by Dr. Dietrich Pates and Dr. Graciela Husbands. She has history of SVT as well as Medtronic permanent pacemaker for AV block and symptomatic bradycardia 2008. She had a normal cardiac catheterization in 2008. She has history of chronic diastolic heart failure ejection fraction 60-65% on 2-D echo in 02/2012.  The patient has been bedridden for the past 4 years. Yesterday she was brought to the emergency room with palpitations she felt were due to taking her Cardizem 20 minutes late. She's also had worsening dyspnea. She was in the emergency room 3 days ago with similar symptoms and was treated with adenosine x2 where she converted to normal sinus rhythm and then went back in to SVT. She was then given IV Cardizem and converted to normal sinus rhythm. Yesterday in the emergency room she converted to sinus rhythm when she was placed on BiPAP. She's been wearing her CPAP 24-hour today because of shortness of breath. Late last night she had a drop in her sats and was intubated.  Past Medical History  Diagnosis Date  . Morbid obesity   . Essential hypertension, benign   . Chronic diastolic heart failure     LVEF 60-65% 2011  . COPD (chronic obstructive pulmonary disease)     Oxygen dependent  . History of cardiac catheterization     Normal coronary arteries 2008  . Obesity hypoventilation syndrome   . Arthritis   . Hiatal hernia   . Symptomatic bradycardia     With pauses 2008 - Followed by Dr. Graciela Husbands  . Cellulitis     Recurrent bilateral LE  . Mixed hyperlipidemia   . Peripheral vascular disease   . Myocardial infarction 1990's  . Sleep apnea   . Type II diabetes mellitus   . Gout   . Nonsustained ventricular tachycardia     Documented 2008  . SVT (supraventricular tachycardia)     Reported possible atrial arrhythmias    Past Surgical History   Procedure Laterality Date  . Abdominal hysterectomy  1980's  . Insert / replace / remove pacemaker  2010    Family History  Problem Relation Age of Onset  . Coronary artery disease Mother     MI age 26, died.  . Hypertension Father   . Osteoarthritis Father   . Hypertension Sister   . Hypertension Brother   . Obesity Father     Social History:  reports that she quit smoking about 45 years ago. Her smoking use included Cigarettes. She has a .05 pack-year smoking history. She has never used smokeless tobacco. She reports that she does not drink alcohol or use illicit drugs.  Allergies:  Allergies  Allergen Reactions  . Morphine And Related Hives    Medications:  Scheduled Meds: . albuterol  2.5 mg Nebulization Q4H  . antiseptic oral rinse  15 mL Mouth Rinse QID  . chlorhexidine  15 mL Mouth Rinse BID  . hydrALAZINE  20 mg Intravenous Q6H  . influenza vac split quadrivalent PF  0.5 mL Intramuscular Tomorrow-1000  . insulin aspart  0-20 Units Subcutaneous Q4H  . metoprolol  5 mg Intravenous Q4H  . pantoprazole (PROTONIX) IV  40 mg Intravenous Daily   Continuous Infusions:  PRN Meds:.acetaminophen, acetaminophen, fentaNYL, midazolam, ondansetron (ZOFRAN) IV, ondansetron, promethazine   Results for orders placed during the hospital encounter of 02/24/13 (from the past 48 hour(s))  CBC WITH DIFFERENTIAL     Status: Abnormal   Collection Time    02/24/13 12:09 PM      Result Value Range   WBC 11.6 (*) 4.0 - 10.5 K/uL   RBC 3.40 (*) 3.87 - 5.11 MIL/uL   Hemoglobin 9.9 (*) 12.0 - 15.0 g/dL   HCT 16.1 (*) 09.6 - 04.5 %   MCV 91.5  78.0 - 100.0 fL   MCH 29.1  26.0 - 34.0 pg   MCHC 31.8  30.0 - 36.0 g/dL   RDW 40.9 (*) 81.1 - 91.4 %   Platelets 296  150 - 400 K/uL   Neutrophils Relative % 76  43 - 77 %   Neutro Abs 8.8 (*) 1.7 - 7.7 K/uL   Lymphocytes Relative 13  12 - 46 %   Lymphs Abs 1.5  0.7 - 4.0 K/uL   Monocytes Relative 6  3 - 12 %   Monocytes Absolute 0.7  0.1 -  1.0 K/uL   Eosinophils Relative 5  0 - 5 %   Eosinophils Absolute 0.6  0.0 - 0.7 K/uL   Basophils Relative 0  0 - 1 %   Basophils Absolute 0.0  0.0 - 0.1 K/uL  BASIC METABOLIC PANEL     Status: Abnormal   Collection Time    02/24/13 12:09 PM      Result Value Range   Sodium 142  135 - 145 mEq/L   Potassium 4.4  3.5 - 5.1 mEq/L   Chloride 98  96 - 112 mEq/L   CO2 34 (*) 19 - 32 mEq/L   Glucose, Bld 183 (*) 70 - 99 mg/dL   BUN 19  6 - 23 mg/dL   Creatinine, Ser 7.82 (*) 0.50 - 1.10 mg/dL   Calcium 9.6  8.4 - 95.6 mg/dL   GFR calc non Af Amer 43 (*) >90 mL/min   GFR calc Af Amer 50 (*) >90 mL/min   Comment: (NOTE)     The eGFR has been calculated using the CKD EPI equation.     This calculation has not been validated in all clinical situations.     eGFR's persistently <90 mL/min signify possible Chronic Kidney     Disease.  TROPONIN I     Status: None   Collection Time    02/24/13 12:09 PM      Result Value Range   Troponin I <0.30  <0.30 ng/mL   Comment:            Due to the release kinetics of cTnI,     a negative result within the first hours     of the onset of symptoms does not rule out     myocardial infarction with certainty.     If myocardial infarction is still suspected,     repeat the test at appropriate intervals.  PRO B NATRIURETIC PEPTIDE     Status: Abnormal   Collection Time    02/24/13  1:41 PM      Result Value Range   Pro B Natriuretic peptide (BNP) 938.5 (*) 0 - 125 pg/mL  URINALYSIS, ROUTINE W REFLEX MICROSCOPIC     Status: Abnormal   Collection Time    02/24/13  4:25 PM      Result Value Range   Color, Urine YELLOW  YELLOW   APPearance CLEAR  CLEAR   Specific Gravity, Urine 1.020  1.005 - 1.030   pH 6.0  5.0 - 8.0   Glucose, UA NEGATIVE  NEGATIVE mg/dL   Hgb urine dipstick TRACE (*) NEGATIVE   Bilirubin Urine NEGATIVE  NEGATIVE   Ketones, ur NEGATIVE  NEGATIVE mg/dL   Protein, ur 308 (*) NEGATIVE mg/dL   Urobilinogen, UA 0.2  0.0 - 1.0 mg/dL    Nitrite NEGATIVE  NEGATIVE   Leukocytes, UA NEGATIVE  NEGATIVE  URINE MICROSCOPIC-ADD ON     Status: Abnormal   Collection Time    02/24/13  4:25 PM      Result Value Range   Squamous Epithelial / LPF FEW (*) RARE   WBC, UA 0-2  <3 WBC/hpf   RBC / HPF 0-2  <3 RBC/hpf   Bacteria, UA FEW (*) RARE  MRSA PCR SCREENING     Status: None   Collection Time    02/24/13  5:30 PM      Result Value Range   MRSA by PCR NEGATIVE  NEGATIVE   Comment:            The GeneXpert MRSA Assay (FDA     approved for NASAL specimens     only), is one component of a     comprehensive MRSA colonization     surveillance program. It is not     intended to diagnose MRSA     infection nor to guide or     monitor treatment for     MRSA infections.  GLUCOSE, CAPILLARY     Status: Abnormal   Collection Time    02/24/13  6:18 PM      Result Value Range   Glucose-Capillary 130 (*) 70 - 99 mg/dL   Comment 1 Documented in Chart     Comment 2 Notify RN    TSH     Status: Abnormal   Collection Time    02/24/13  6:31 PM      Result Value Range   TSH 4.613 (*) 0.350 - 4.500 uIU/mL   Comment: Performed at [facility]  HEMOGLOBIN A1C     Status: Abnormal   Collection Time    02/24/13  6:31 PM      Result Value Range   Hemoglobin A1C 7.6 (*) <5.7 %   Comment: (NOTE)                                                                               According to the ADA Clinical Practice Recommendations for 2011, when     HbA1c is used as a screening test:      >=6.5%   Diagnostic of Diabetes Mellitus               (if abnormal result is confirmed)     5.7-6.4%   Increased risk of developing Diabetes Mellitus     References:Diagnosis and Classification of Diabetes Mellitus,Diabetes     Care,2011,34(Suppl 1):S62-S69 and Standards of Medical Care in             Diabetes - 2011,Diabetes Care,2011,34 (Suppl 1):S11-S61.   Mean Plasma Glucose 171 (*) <117 mg/dL   Comment: Performed at [facility]   TROPONIN I     Status: None   Collection Time    02/24/13  6:31 PM  Result Value Range   Troponin I <0.30  <0.30 ng/mL   Comment:            Due to the release kinetics of cTnI,     a negative result within the first hours     of the onset of symptoms does not rule out     myocardial infarction with certainty.     If myocardial infarction is still suspected,     repeat the test at appropriate intervals.  BLOOD GAS, ARTERIAL     Status: Abnormal   Collection Time    02/24/13  8:15 PM      Result Value Range   FIO2 0.40     Delivery systems BILEVEL POSITIVE AIRWAY PRESSURE     Rate 8.0     Peep/cpap 8.0     Inspiratory PAP 16.0     Expiratory PAP 8.0     pH, Arterial 7.154 (*) 7.350 - 7.450   Comment: CRITICAL RESULT CALLED TO, READ BACK BY AND VERIFIED WITH:     MELAINE SIMPSON,RN BY BRITTANY FRETWELL ON 10     CRITICAL RESULT CALLED TO, READ BACK BY AND VERIFIED WITH:     MELAINE SIMPSON,RN BY BRITTANY FRETWELL ON 02/24/13 AT 2030   pCO2 arterial 103.0 (*) 35.0 - 45.0 mmHg   Comment: CRITICAL RESULT CALLED TO, READ BACK BY AND VERIFIED WITH:     MELAINE SIMPSON,RN BY BRITTANY FRETWELL ON 02/24/13 AT 2030   pO2, Arterial 70.2 (*) 80.0 - 100.0 mmHg   Bicarbonate 34.8 (*) 20.0 - 24.0 mEq/L   Acid-Base Excess 6.3 (*) 0.0 - 2.0 mmol/L   O2 Saturation 87.8     Patient temperature 37.0     Collection site RIGHT RADIAL     Drawn by [phone]     Sample type ARTERIAL DRAW     Allens test (pass/fail) PASS  PASS  BLOOD GAS, ARTERIAL     Status: Abnormal   Collection Time    02/24/13  9:48 PM      Result Value Range   FIO2 1.00     Delivery systems VENTILATOR     Mode PRESSURE REGULATED VOLUME CONTROL     VT 400     Rate 14     Peep/cpap 5.0     pH, Arterial 7.172 (*) 7.350 - 7.450   Comment: CRITICAL RESULT CALLED TO, READ BACK BY AND VERIFIED WITH:     MICHEAL GRAY,RN BY BRITTANY FRETWELL,RRT,RCP ON 02/24/13 AT 2203   pCO2 arterial 96.3 (*) 35.0 - 45.0 mmHg   Comment:  CRITICAL RESULT CALLED TO, READ BACK BY AND VERIFIED WITH:     MICHAEL GRAY,RN BY BRITTANY FRETWELL,RRT,RCP ON 02/24/13 AT 2203   pO2, Arterial 298.0 (*) 80.0 - 100.0 mmHg   Bicarbonate 33.9 (*) 20.0 - 24.0 mEq/L   Acid-Base Excess 5.7 (*) 0.0 - 2.0 mmol/L   O2 Saturation 99.0     Patient temperature 37.0     Collection site RIGHT RADIAL     Drawn by [phone]     Sample type ARTERIAL DRAW     Allens test (pass/fail) PASS  PASS  GLUCOSE, CAPILLARY     Status: Abnormal   Collection Time    02/24/13 10:01 PM      Result Value Range   Glucose-Capillary 154 (*) 70 - 99 mg/dL  TROPONIN I     Status: None   Collection Time    02/24/13 10:56 PM  Result Value Range   Troponin I <0.30  <0.30 ng/mL   Comment:            Due to the release kinetics of cTnI,     a negative result within the first hours     of the onset of symptoms does not rule out     myocardial infarction with certainty.     If myocardial infarction is still suspected,     repeat the test at appropriate intervals.  GLUCOSE, CAPILLARY     Status: Abnormal   Collection Time    02/25/13  1:06 AM      Result Value Range   Glucose-Capillary 145 (*) 70 - 99 mg/dL  BASIC METABOLIC PANEL     Status: Abnormal   Collection Time    02/25/13  5:00 AM      Result Value Range   Sodium 140  135 - 145 mEq/L   Potassium 4.0  3.5 - 5.1 mEq/L   Chloride 97  96 - 112 mEq/L   CO2 34 (*) 19 - 32 mEq/L   Glucose, Bld 138 (*) 70 - 99 mg/dL   BUN 21  6 - 23 mg/dL   Creatinine, Ser 1.61 (*) 0.50 - 1.10 mg/dL   Calcium 9.4  8.4 - 09.6 mg/dL   GFR calc non Af Amer 39 (*) >90 mL/min   GFR calc Af Amer 45 (*) >90 mL/min   Comment: (NOTE)     The eGFR has been calculated using the CKD EPI equation.     This calculation has not been validated in all clinical situations.     eGFR's persistently <90 mL/min signify possible Chronic Kidney     Disease.  CBC     Status: Abnormal   Collection Time    02/25/13  5:00 AM      Result Value Range    WBC 11.1 (*) 4.0 - 10.5 K/uL   RBC 3.17 (*) 3.87 - 5.11 MIL/uL   Hemoglobin 9.1 (*) 12.0 - 15.0 g/dL   HCT 04.5 (*) 40.9 - 81.1 %   MCV 91.2  78.0 - 100.0 fL   MCH 28.7  26.0 - 34.0 pg   MCHC 31.5  30.0 - 36.0 g/dL   RDW 91.4 (*) 78.2 - 95.6 %   Platelets 286  150 - 400 K/uL  GLUCOSE, CAPILLARY     Status: Abnormal   Collection Time    02/25/13  5:11 AM      Result Value Range   Glucose-Capillary 130 (*) 70 - 99 mg/dL  GLUCOSE, CAPILLARY     Status: Abnormal   Collection Time    02/25/13  7:14 AM      Result Value Range   Glucose-Capillary 135 (*) 70 - 99 mg/dL  BLOOD GAS, ARTERIAL     Status: Abnormal   Collection Time    02/25/13  9:15 AM      Result Value Range   FIO2 0.40     O2 Content 40.0     Delivery systems VENTILATOR     Mode PRESSURE REGULATED VOLUME CONTROL     VT 400     Rate 22     Peep/cpap 8.0     pH, Arterial 7.402  7.350 - 7.450   pCO2 arterial 54.5 (*) 35.0 - 45.0 mmHg   pO2, Arterial 84.9  80.0 - 100.0 mmHg   Bicarbonate 33.2 (*) 20.0 - 24.0 mEq/L   TCO2 30.1  0 - 100 mmol/L  Acid-Base Excess 8.3 (*) 0.0 - 2.0 mmol/L   O2 Saturation 96.2     Patient temperature 37.0     Collection site RIGHT RADIAL     Drawn by [phone]     Sample type ARTERIAL     Allens test (pass/fail) PASS  PASS    Portable Chest Xray In Am  02/25/2013   CLINICAL DATA:  Respiratory failure and intubation.  EXAM: PORTABLE CHEST - 1 VIEW  COMPARISON:  02/24/2013  FINDINGS: Endotracheal tube appropriately positioned level of carina. Nasogastric tube poorly visualized distally. Likely extends beyond the inferior aspect of the film.  Pacer is unchanged in position. Cardiomegaly accentuated by AP portable technique. Probable small bilateral pleural effusions. No pneumothorax. Mild interstitial edema is similar. Left greater than right bibasilar airspace disease.  IMPRESSION: No significant change in congestive heart failure with bilateral pleural effusions and adjacent airspace disease.    Electronically Signed   By: Jeronimo Greaves M.D.   On: 02/25/2013 07:44   Portable Chest Xray  02/24/2013   CLINICAL DATA:  Endotracheal tube placement.  EXAM: PORTABLE CHEST - 1 VIEW  COMPARISON:  02/24/2013  FINDINGS: Endotracheal tube is 2.7 cm above the carina, satisfactorily positioned.  A nasogastric tube enters the stomach.  Pacer leads remain in place.  Bilateral interstitial opacity noted with increasing perihilar and basilar airspace opacities. There is obscuration of the left hemidiaphragm.  IMPRESSION: 1. Endotracheal tube satisfactorily position, 2.7 cm above the carina. 2. Increased airspace opacity especially at the left lung base and also in the left perihilar region, with underlying interstitial opacity. Appearance may reflect asymmetric pulmonary edema, atelectasis, or pneumonia. 3. Nasogastric tube is been placed, and extends into the stomach.   Electronically Signed   By: Herbie Baltimore M.D.   On: 02/24/2013 21:28   Dg Chest Port 1 View  02/24/2013   CLINICAL DATA:  Dyspnea, cough, palpitations  EXAM: PORTABLE CHEST - 1 VIEW  COMPARISON:  03/03/2012  FINDINGS: Cardiomegaly again noted. Dual lead cardiac pacemaker is unchanged in position. Central mild vascular congestion and mild interstitial prominence bilaterally suspicious for mild interstitial edema. No segmental infiltrate. Mild left basilar atelectasis.  IMPRESSION: Central mild vascular congestion and mild interstitial prominence bilaterally suspicious for mild interstitial edema. No segmental infiltrate. Mild left basilar atelectasis.   Electronically Signed   By: Natasha Mead M.D.   On: 02/24/2013 13:04   Dg Abd Portable 1v  02/24/2013   CLINICAL DATA:  Vomiting. Hypertension.  EXAM: PORTABLE ABDOMEN - 1 VIEW  COMPARISON:  None.  FINDINGS: Pacer leads project over the cardiac shadow. Body habitus reduces diagnostic sensitivity and specificity. Cardiomegaly noted.  Supine views of the abdomen demonstrate formed stool in the colon.  No definite dilated loops of bowel but significant portions of the bowel are gasless.  IMPRESSION: 1. No specific abnormality observed in the bowel gas pattern, but significant portions of the bowel are gasless which can reduce negative predictive value. There is formed stool in portions of the colon. 2. Cardiomegaly. 3.  Body habitus reduces diagnostic sensitivity and specificity.   Electronically Signed   By: Herbie Baltimore M.D.   On: 02/24/2013 20:41    ROS patient intubated. See HPI  Blood pressure 147/66, pulse 85, temperature 99.9 F (37.7 C), temperature source Oral, resp. rate 22, height 5' (1.524 m), weight 430 lb 3.2 oz (195.137 kg), SpO2 97.00%.  Physical Exam PHYSICAL EXAM: Obese, on ventilator. Neck: No JVD, HJR, Bruit, or thyroid enlargement Lungs: Decreased breath  sounds with scattered rhonchi Cardiovascular: RRR, PMI not displaced, heart sounds distant, no murmurs, gallops, bruit, thrill, or heave. Abdomen: Obese BS normal. Soft without organomegaly, masses, lesions or tenderness. Extremities: +2-3 edema bilaterally decreased distal pulses bilateral SKin: Warm, no lesions or rashes  Musculoskeletal: No deformities Neuro: Sedated and intubated  2Decho:03/04/13: Study Conclusions  - Left ventricle: The cavity size was normal. Wall thickness   was increased in a pattern of moderate to severe LVH.   Systolic function was normal. The estimated ejection   fraction was in the range of 60% to 65%. Although no   diagnostic regional wall motion abnormality was   identified, this possibility cannot be completely excluded   on the basis of this study. Features are consistent with a   pseudonormal left ventricular filling pattern, with   concomitant abnormal relaxation and increased filling   pressure (grade 2 diastolic dysfunction). - Aortic valve: Trivial regurgitation. - Aortic root: The aortic root was mildly ectatic. - Ascending aorta: The ascending aorta was mildly  dilated. - Mitral valve: Calcified annulus. Trivial regurgitation. - Left atrium: The atrium was at the upper limits of normal   in size. - Tricuspid valve: Trivial regurgitation. - Pulmonary arteries: Systolic pressure could not be   accurately estimated. - Pericardium, extracardiac: There was no pericardial   effusion.  EKG: Atrial sensed, ventricular paced.  Assessment/Plan: Respiratory failure requiring intubation SVT converted to normal sinus rhythm emergency room on BiPAP, now on Lopressor 5mg  every 4 hrs. Acute on chronic diastolic heart failure: Ejection fraction 60-65% on echo in 2013. Repeat ordered. Patient diuresing after Lasix 40mg  IV once. BNP 928. Medtronic pacemaker in 2008 for symptomatic bradycardia and AV block History of nonsustained V. Tach History of normal coronary arteries on cardiac catheterization in 2008 Morbid obesity: Has been bedridden for the past 4 years  [name] 02/25/2013, 9:50 AM    Attending note Patient seen and discussed with NP Lenze. 57 yo female with multiple medical comorbidities including severe obsesity, PSVT, symptomatic bradycardia s/p pacemaker placement, obesity hypovent, OSA, an COPD initially presented with palpitations. Had some intermittent SVT she feels like was because she took her cardizem later than usual. In ER had transient episode that resolved. Later developed significant respiratory failure, predominately hypercapneic with severely elevated pCO2 and resp acidosis requiring intubation. From a cardiac standpoint she is in normal sinus rhythm, suspect some volume overload in the setting of chronic diastolic heart failure, but this would not contribute to hypercapneic resp failure. The pacemaker interrogation has apparently been sent to medical records, I am in the process of having them upload it to review. Will switch her off IV meds back to her oral doses through her OG tube. Repeat echo, continue diuresis.    Dina Rich MD

## 2013-02-25 NOTE — Care Management Note (Addendum)
    Page 1 of 2   03/04/2013     1:31:21 PM   CARE MANAGEMENT NOTE 03/04/2013  Patient:  [name], [name]   Account Number:  [id number]  Date Initiated:  02/25/2013  Documentation initiated by:  Sharrie Rothman  Subjective/Objective Assessment:   Pt admitted from home with CHF and respiratory failure. PT currently on the ventilator.     Action/Plan:   Will attempt to talk with family/ pt once off of ventilator. Pt has been followed by [facility] in the past. Will continue to follow.   Anticipated DC Date:  03/03/2013   Anticipated DC Plan:  HOME W HOME HEALTH SERVICES      DC Planning Services  CM consult      Choice offered to / List presented to:             [facility] agency  [facility]   Status of service:  In process, will continue to follow Medicare Important Message given?   (If response is "NO", the following Medicare IM given date fields will be blank) Date Medicare IM given:   Date Additional Medicare IM given:    Discharge Disposition:  ACUTE TO ACUTE TRANS  Per UR Regulation:    If discussed at Long Length of Stay Meetings, dates discussed:   03/03/2013    Comments:  03-04-13  1:30pm Avie Arenas,  RNBSN [phone] Patient now with trach - some pain at site.  Awake and alert - communicating with tablet and writing.  Lives at home with husband and caregiver.  Caregiver lives with her and is with her 24/7.  Able to take care of trach.  Hopes to be able to return to this setting.  Agreeable to [facility] RN on discharge - has had [facility] in past.  Caregiver, husband and patient will need trach training.  Hope to be able to tolerate trach collar 24/7.  03-03-13 1:45pm Avie Arenas, RNBSN [phone] tx from [facility] - plan for trach tomorrow.  03/03/13 0815 Arlyss Queen, RN BSN CM Pt to transfer to [facility] today.  03/02/13 1030 Tammy Blackwell, RN BSN CM Pt extubated over the weekend and had to be reintubated within several hours. Pt remains on ventilator at this  time. 02/25/13 1515 Arlyss Queen, RN BSN

## 2013-02-25 NOTE — Progress Notes (Signed)
Subjective:  Patient was admitted yesterday by hospitalist  due to shortness of breath. Patient developed severe respiratory distress with hypercapnia. She was intubated during the night. She is sedated.  Objective: Vital signs in last 24 hours: Temp:  [98.1 F (36.7 C)-98.5 F (36.9 C)] 98.1 F (36.7 C) (10/07 1800) Pulse Rate:  [57-153] 57 (10/08 0751) Resp:  [5-28] 22 (10/08 0751) BP: (0-179)/(0-111) 155/83 mmHg (10/08 0700) SpO2:  [86 %-100 %] 97 % (10/08 0751) FiO2 (%):  [40 %-100 %] 40 % (10/08 0751) Weight:  [172.82 kg (381 lb)-195.137 kg (430 lb 3.2 oz)] 195.137 kg (430 lb 3.2 oz) (10/08 0600) Weight change:  Last BM Date: 02/23/13  Intake/Output from previous day: 10/07 0701 - 10/08 0700 In: 480 [P.O.:480] Out: 1750 [Urine:1400; Emesis/NG output:350]  PHYSICAL EXAM General appearance: moderately obese and slowed mentation Resp: diminished breath sounds bilaterally and rhonchi bilaterally Cardio: S1 and S2 heard bradycardiac GI: soft, non-tender; bowel sounds normal; no masses,  no organomegaly Extremities: 2++ edema  Lab Results:    @labtest @ [facility]  Recent Labs  02/24/13 2148  PHART 7.172*  PO2ART 298.0*  HCO3 33.9*   CULTURES Recent Results (from the past 240 hour(s))  MRSA PCR SCREENING     Status: None   Collection Time    02/24/13  5:30 PM      Result Value Range Status   MRSA by PCR NEGATIVE  NEGATIVE Final   Comment:            The GeneXpert MRSA Assay (FDA     approved for NASAL specimens     only), is one component of a     comprehensive MRSA colonization     surveillance program. It is not     intended to diagnose MRSA     infection nor to guide or     monitor treatment for     MRSA infections.   Studies/Results: Portable Chest Xray In Am  02/25/2013   CLINICAL DATA:  Respiratory failure and intubation.  EXAM: PORTABLE CHEST - 1 VIEW  COMPARISON:  02/24/2013  FINDINGS: Endotracheal tube appropriately positioned level of carina.  Nasogastric tube poorly visualized distally. Likely extends beyond the inferior aspect of the film.  Pacer is unchanged in position. Cardiomegaly accentuated by AP portable technique. Probable small bilateral pleural effusions. No pneumothorax. Mild interstitial edema is similar. Left greater than right bibasilar airspace disease.  IMPRESSION: No significant change in congestive heart failure with bilateral pleural effusions and adjacent airspace disease.   Electronically Signed   By: Jeronimo Greaves M.D.   On: 02/25/2013 07:44   Portable Chest Xray  02/24/2013   CLINICAL DATA:  Endotracheal tube placement.  EXAM: PORTABLE CHEST - 1 VIEW  COMPARISON:  02/24/2013  FINDINGS: Endotracheal tube is 2.7 cm above the carina, satisfactorily positioned.  A nasogastric tube enters the stomach.  Pacer leads remain in place.  Bilateral interstitial opacity noted with increasing perihilar and basilar airspace opacities. There is obscuration of the left hemidiaphragm.  IMPRESSION: 1. Endotracheal tube satisfactorily position, 2.7 cm above the carina. 2. Increased airspace opacity especially at the left lung base and also in the left perihilar region, with underlying interstitial opacity. Appearance may reflect asymmetric pulmonary edema, atelectasis, or pneumonia. 3. Nasogastric tube is been placed, and extends into the stomach.   Electronically Signed   By: Herbie Baltimore M.D.   On: 02/24/2013 21:28   Dg Chest Port 1 View  02/24/2013   CLINICAL DATA:  Dyspnea,  cough, palpitations  EXAM: PORTABLE CHEST - 1 VIEW  COMPARISON:  03/03/2012  FINDINGS: Cardiomegaly again noted. Dual lead cardiac pacemaker is unchanged in position. Central mild vascular congestion and mild interstitial prominence bilaterally suspicious for mild interstitial edema. No segmental infiltrate. Mild left basilar atelectasis.  IMPRESSION: Central mild vascular congestion and mild interstitial prominence bilaterally suspicious for mild interstitial edema.  No segmental infiltrate. Mild left basilar atelectasis.   Electronically Signed   By: Natasha Mead M.D.   On: 02/24/2013 13:04   Dg Abd Portable 1v  02/24/2013   CLINICAL DATA:  Vomiting. Hypertension.  EXAM: PORTABLE ABDOMEN - 1 VIEW  COMPARISON:  None.  FINDINGS: Pacer leads project over the cardiac shadow. Body habitus reduces diagnostic sensitivity and specificity. Cardiomegaly noted.  Supine views of the abdomen demonstrate formed stool in the colon. No definite dilated loops of bowel but significant portions of the bowel are gasless.  IMPRESSION: 1. No specific abnormality observed in the bowel gas pattern, but significant portions of the bowel are gasless which can reduce negative predictive value. There is formed stool in portions of the colon. 2. Cardiomegaly. 3.  Body habitus reduces diagnostic sensitivity and specificity.   Electronically Signed   By: Herbie Baltimore M.D.   On: 02/24/2013 20:41    Medications: I have reviewed the patient's current medications.  Assesment: Ventilatory dependent respiratory failure Principal Problem:   Acute on chronic diastolic heart failure Active Problems:   DM (diabetes mellitus), type 2, uncontrolled with complications   HYPERLIPIDEMIA   OBESITY, MORBID   Anemia, iron deficiency   SLEEP APNEA, OBSTRUCTIVE   HYPERTENSION   CORONARY ARTERY DISEASE   GERD   PACEMAKER, PERMANENT   Obesity hypoventilation syndrome   SVT (supraventricular tachycardia)   Acute respiratory failure    Plan: Medications reviewed Will do ABG now Continue ventilatory support Cardiology consult    LOS: 1 day   Scottie Metayer 02/25/2013, 8:21 AM

## 2013-02-25 NOTE — Progress Notes (Signed)
INITIAL NUTRITION ASSESSMENT  DOCUMENTATION CODES Per approved criteria  -Morbid Obesity   INTERVENTION:  If decreased fluid intake desired suggest: change formula to Osm 1.5 to decrease volume. Continuous Osm 1.5 @ 30  ml/hr via orogastric tube and add 50 ml Prostat TID.    At goal rate, tube feeding regimen will provide1580 kcal, 120 grams of protein, and 549 ml of H2O from formula. Nutrition support will meet estimated energy and protein goals.  Initiate Adult Enteral Nutrition Protocol  NUTRITION DIAGNOSIS: Inadequate oral intake related to inability to eat as evidenced by NPO status.  Goal: Enteral nutrition to provide 60-70% of estimated calorie needs (22-25 kcals/kg ideal body weight) and 100% of estimated protein needs, based on ASPEN guidelines for permissive underfeeding in critically ill obese individuals.  Monitor:  Nutrition support measures, labs, I/O's wt changes  Reason for Assessment: Mechanical Ventilation   57 y.o. female  Admitting Dx: Acute on chronic diastolic heart failure  ASSESSMENT: Patient is currently intubated on ventilator support due to respiratory failure. Intubated < 24 hrs. Tube feeding started. Glucerna 1.2 @ 55 ml/hr providing  MV: 9.3  L/min Temp:Temp (24hrs), Avg:99 F (37.2 C), Min:98.1 F (36.7 C), Max:99.9 F (37.7 C)   Sodium  Date/Time Value Range Status  02/25/2013  5:00 AM 140  135 - 145 mEq/L Final  02/24/2013 12:09 PM 142  135 - 145 mEq/L Final  02/20/2013 12:47 AM 138  135 - 145 mEq/L Final    Potassium  Date/Time Value Range Status  02/25/2013  5:00 AM 4.0  3.5 - 5.1 mEq/L Final  02/24/2013 12:09 PM 4.4  3.5 - 5.1 mEq/L Final  02/20/2013 12:47 AM 3.5  3.5 - 5.1 mEq/L Final    Phosphorus  Date/Time Value Range Status  02/27/2011 12:30 AM 3.0  2.3 - 4.6 mg/dL Final  40/98/1191  4:78 AM 3.6  2.3 - 4.6 mg/dL Final  29/56/2130  8:65 AM 3.7  2.3 - 4.6 mg/dL Final    Magnesium  Date/Time Value Range Status  04/01/2011   6:03 AM 2.0  1.5 - 2.5 mg/dL Final  78/08/6960 95:28 AM 1.8  1.5 - 2.5 mg/dL Final  41/32/4401  0:27 AM 2.0  1.5 - 2.5 mg/dL Final    CBG (last 3)   Recent Labs  02/25/13 0511 02/25/13 0714 02/25/13 1129  GLUCAP 130* 135* 155*    Height: Ht Readings from Last 1 Encounters:  02/24/13 5' (1.524 m)    Weight: Wt Readings from Last 1 Encounters:  02/25/13 430 lb 3.2 oz (195.137 kg)    Ideal Body Weight: 100# (45.4 kg)  % Ideal Body Weight: 430%  Wt Readings from Last 10 Encounters:  02/25/13 430 lb 3.2 oz (195.137 kg)  03/04/12 381 lb 2.8 oz (172.9 kg)  01/31/12 410 lb (185.975 kg)  11/15/11 413 lb 8 oz (187.562 kg)  09/24/11 400 lb (181.439 kg)  08/21/11 400 lb (181.439 kg)  07/17/11 398 lb 11.2 oz (180.85 kg)  04/02/11 398 lb 4.8 oz (180.668 kg)  02/27/11 407 lb 3 oz (184.7 kg)  03/17/09 396 lb (179.624 kg)    Usual Body Weight: 400#  % Usual Body Weight: 108%  BMI:  Body mass index is 84.02 kg/(m^2). extreme obesity class III  Estimated Nutritional Needs: Kcal: 2647  (underfeeding goal 60-70% is 1588-1852 kcal q 24 hr) Protein: 112 gr Fluid: per MD goals  Skin: No issues noted  Diet Order: NPO  EDUCATION NEEDS: -Education not appropriate at this time  Intake/Output Summary (Last 24 hours) at 02/25/13 1228 Last data filed at 02/25/13 0800  Gross per 24 hour  Intake    480 ml  Output   1875 ml  Net  -1395 ml    Last BM: 02/23/13  Labs:   Recent Labs Lab 02/20/13 0047 02/24/13 1209 02/25/13 0500  NA 138 142 140  K 3.5 4.4 4.0  CL 95* 98 97  CO2 31 34* 34*  BUN 25* 19 21  CREATININE 1.72* 1.33* 1.45*  CALCIUM 9.1 9.6 9.4  GLUCOSE 171* 183* 138*    CBG (last 3)   Recent Labs  02/25/13 0511 02/25/13 0714 02/25/13 1129  GLUCAP 130* 135* 155*    Scheduled Meds: . albuterol  2.5 mg Nebulization Q4H  . antiseptic oral rinse  15 mL Mouth Rinse QID  . carvedilol  25 mg Oral BID WC  . chlorhexidine  15 mL Mouth Rinse BID  .  cloNIDine  0.2 mg Oral BID  . diltiazem  30 mg Oral Q6H  . insulin aspart  0-20 Units Subcutaneous Q4H  . pantoprazole (PROTONIX) IV  40 mg Intravenous Daily    Continuous Infusions: . feeding supplement (GLUCERNA 1.2 CAL)      Past Medical History  Diagnosis Date  . Morbid obesity   . Essential hypertension, benign   . Chronic diastolic heart failure     LVEF 60-65% 2011  . COPD (chronic obstructive pulmonary disease)     Oxygen dependent  . History of cardiac catheterization     Normal coronary arteries 2008  . Obesity hypoventilation syndrome   . Arthritis   . Hiatal hernia   . Symptomatic bradycardia     With pauses 2008 - Followed by Dr. Graciela Husbands  . Cellulitis     Recurrent bilateral LE  . Mixed hyperlipidemia   . Peripheral vascular disease   . Myocardial infarction 1990's  . Sleep apnea   . Type II diabetes mellitus   . Gout   . Nonsustained ventricular tachycardia     Documented 2008  . SVT (supraventricular tachycardia)     Reported possible atrial arrhythmias    Past Surgical History  Procedure Laterality Date  . Abdominal hysterectomy  1980's  . Insert / replace / remove pacemaker  2010    Royann Shivers MS,RD,LDN,CSG Office: [phone] Pager: [phone]

## 2013-02-25 NOTE — Progress Notes (Signed)
UR chart review completed.  

## 2013-02-26 DIAGNOSIS — I517 Cardiomegaly: Secondary | ICD-10-CM

## 2013-02-26 LAB — COMPREHENSIVE METABOLIC PANEL
Albumin: 2.9 g/dL — ABNORMAL LOW (ref 3.5–5.2)
Alkaline Phosphatase: 74 U/L (ref 39–117)
BUN: 21 mg/dL (ref 6–23)
Calcium: 9.3 mg/dL (ref 8.4–10.5)
Chloride: 99 mEq/L (ref 96–112)
Creatinine, Ser: 1.52 mg/dL — ABNORMAL HIGH (ref 0.50–1.10)
GFR calc Af Amer: 43 mL/min — ABNORMAL LOW (ref >90)
Glucose, Bld: 150 mg/dL — ABNORMAL HIGH (ref 70–99)
Potassium: 3.2 mEq/L — ABNORMAL LOW (ref 3.5–5.1)
Total Protein: 6.5 g/dL (ref 6.0–8.3)

## 2013-02-26 LAB — BLOOD GAS, ARTERIAL
Bicarbonate: 32.7 mEq/L — ABNORMAL HIGH (ref 20.0–24.0)
Drawn by: 23534
FIO2: 0.4 %
MECHVT: 400 mL
PEEP: 5 cmH2O
Patient temperature: 37
RATE: 22 resp/min
TCO2: 30.7 mmol/L (ref 0–100)
pH, Arterial: 7.385 (ref 7.350–7.450)
pO2, Arterial: 108 mmHg — ABNORMAL HIGH (ref 80.0–100.0)

## 2013-02-26 LAB — CBC
HCT: 25.4 % — ABNORMAL LOW (ref 36.0–46.0)
Hemoglobin: 8 g/dL — ABNORMAL LOW (ref 12.0–15.0)
MCH: 28.2 pg (ref 26.0–34.0)
MCHC: 31.5 g/dL (ref 30.0–36.0)
RDW: 17.9 % — ABNORMAL HIGH (ref 11.5–15.5)
WBC: 11 10*3/uL — ABNORMAL HIGH (ref 4.0–10.5)

## 2013-02-26 LAB — GLUCOSE, CAPILLARY
Glucose-Capillary: 138 mg/dL — ABNORMAL HIGH (ref 70–99)
Glucose-Capillary: 143 mg/dL — ABNORMAL HIGH (ref 70–99)
Glucose-Capillary: 145 mg/dL — ABNORMAL HIGH (ref 70–99)
Glucose-Capillary: 148 mg/dL — ABNORMAL HIGH (ref 70–99)

## 2013-02-26 LAB — IRON AND TIBC
Saturation Ratios: 7 % — ABNORMAL LOW (ref 20–55)
TIBC: 223 ug/dL — ABNORMAL LOW (ref 250–470)
UIBC: 208 ug/dL (ref 125–400)

## 2013-02-26 LAB — VITAMIN B12: Vitamin B-12: 360 pg/mL (ref 211–911)

## 2013-02-26 LAB — RETICULOCYTES
RBC.: 2.89 MIL/uL — ABNORMAL LOW (ref 3.87–5.11)
Retic Ct Pct: 2.5 % (ref 0.4–3.1)

## 2013-02-26 LAB — FERRITIN: Ferritin: 189 ng/mL (ref 10–291)

## 2013-02-26 MED ORDER — DILTIAZEM HCL 25 MG/5ML IV SOLN
10.0000 mg | Freq: Once | INTRAVENOUS | Status: AC
  Administered 2013-02-26: 10 mg via INTRAVENOUS

## 2013-02-26 MED ORDER — POTASSIUM CHLORIDE 10 MEQ/100ML IV SOLN
10.0000 meq | INTRAVENOUS | Status: AC
  Administered 2013-02-26 (×2): 10 meq via INTRAVENOUS
  Filled 2013-02-26 (×2): qty 100

## 2013-02-26 MED ORDER — TORSEMIDE 20 MG PO TABS
50.0000 mg | ORAL_TABLET | Freq: Two times a day (BID) | ORAL | Status: DC
  Administered 2013-02-26 (×2): 50 mg
  Filled 2013-02-26 (×2): qty 3

## 2013-02-26 MED ORDER — DILTIAZEM HCL 25 MG/5ML IV SOLN
INTRAVENOUS | Status: AC
  Administered 2013-02-26: 10 mg via INTRAVENOUS
  Filled 2013-02-26: qty 5

## 2013-02-26 MED ORDER — HYDRALAZINE HCL 50 MG PO TABS
100.0000 mg | ORAL_TABLET | Freq: Four times a day (QID) | ORAL | Status: DC
  Administered 2013-02-26 – 2013-03-06 (×33): 100 mg via ORAL
  Administered 2013-03-07: 50 mg via ORAL
  Administered 2013-03-07: 100 mg via ORAL
  Administered 2013-03-07: 50 mg via ORAL
  Administered 2013-03-07 – 2013-03-27 (×79): 100 mg via ORAL
  Filled 2013-02-26 (×12): qty 2
  Filled 2013-02-26: qty 4
  Filled 2013-02-26 (×2): qty 2
  Filled 2013-02-26 (×2): qty 4
  Filled 2013-02-26 (×9): qty 2
  Filled 2013-02-26: qty 4
  Filled 2013-02-26 (×10): qty 2
  Filled 2013-02-26: qty 4
  Filled 2013-02-26 (×3): qty 2
  Filled 2013-02-26: qty 4
  Filled 2013-02-26 (×2): qty 2
  Filled 2013-02-26 (×2): qty 4
  Filled 2013-02-26 (×3): qty 2
  Filled 2013-02-26: qty 4
  Filled 2013-02-26: qty 2
  Filled 2013-02-26: qty 4
  Filled 2013-02-26 (×13): qty 2
  Filled 2013-02-26: qty 4
  Filled 2013-02-26 (×5): qty 2
  Filled 2013-02-26: qty 4
  Filled 2013-02-26 (×3): qty 2
  Filled 2013-02-26: qty 4
  Filled 2013-02-26 (×5): qty 2
  Filled 2013-02-26: qty 4
  Filled 2013-02-26 (×5): qty 2
  Filled 2013-02-26: qty 4
  Filled 2013-02-26 (×3): qty 2
  Filled 2013-02-26 (×2): qty 4
  Filled 2013-02-26 (×11): qty 2
  Filled 2013-02-26: qty 4
  Filled 2013-02-26 (×11): qty 2
  Filled 2013-02-26: qty 4
  Filled 2013-02-26 (×2): qty 2
  Filled 2013-02-26: qty 4
  Filled 2013-02-26: qty 2

## 2013-02-26 NOTE — Progress Notes (Signed)
Patients HR noted to be elevated in the 150's-160's. Ventilator settings adjusted per RT. Pain medication given with no change in HR. Dr. Wyline Mood notified. Cardizem 10mg  IV push ordered and given x1. Patient responded well to medication immediately. Will continue to monitor.

## 2013-02-26 NOTE — Progress Notes (Signed)
*  PRELIMINARY RESULTS* Echocardiogram 2D Echocardiogram has been performed.  Karna Abed 02/26/2013, 4:03 PM

## 2013-02-26 NOTE — Progress Notes (Signed)
Subjective:  Patient is orally intubated and on ventilatory support. She is alert and awake. Her ABG has improved. She is started on tube feeding.   Objective: Vital signs in last 24 hours: Temp:  [98.9 F (37.2 C)-99.9 F (37.7 C)] 98.9 F (37.2 C) (10/09 0800) Pulse Rate:  [60-92] 67 (10/09 0800) Resp:  [22-24] 22 (10/09 0323) BP: (129-172)/(58-93) 146/86 mmHg (10/09 0600) SpO2:  [92 %-100 %] 100 % (10/09 0800) FiO2 (%):  [40 %] 40 % (10/09 0326) Weight:  [188.606 kg (415 lb 12.8 oz)] 188.606 kg (415 lb 12.8 oz) (10/09 0500) Weight change: 15.785 kg (34 lb 12.8 oz) Last BM Date: 02/23/13  Intake/Output from previous day: 10/08 0701 - 10/09 0700 In: 379.5 [NG/GT:379.5] Out: 1125 [Urine:1125]  PHYSICAL EXAM General appearance: moderately obese and slowed mentation Resp: diminished breath sounds bilaterally and rhonchi bilaterally Cardio: S1 and S2 heard bradycardiac GI: soft, non-tender; bowel sounds normal; no masses,  no organomegaly Extremities: 2++ edema  Lab Results:    @labtest @ [facility]  Recent Labs  02/25/13 0915  PHART 7.402  PO2ART 84.9  TCO2 30.1  HCO3 33.2*   CULTURES Recent Results (from the past 240 hour(s))  MRSA PCR SCREENING     Status: None   Collection Time    02/24/13  5:30 PM      Result Value Range Status   MRSA by PCR NEGATIVE  NEGATIVE Final   Comment:            The GeneXpert MRSA Assay (FDA     approved for NASAL specimens     only), is one component of a     comprehensive MRSA colonization     surveillance program. It is not     intended to diagnose MRSA     infection nor to guide or     monitor treatment for     MRSA infections.   Studies/Results: Portable Chest Xray In Am  02/25/2013   CLINICAL DATA:  Respiratory failure and intubation.  EXAM: PORTABLE CHEST - 1 VIEW  COMPARISON:  02/24/2013  FINDINGS: Endotracheal tube appropriately positioned level of carina. Nasogastric tube poorly visualized distally. Likely extends beyond  the inferior aspect of the film.  Pacer is unchanged in position. Cardiomegaly accentuated by AP portable technique. Probable small bilateral pleural effusions. No pneumothorax. Mild interstitial edema is similar. Left greater than right bibasilar airspace disease.  IMPRESSION: No significant change in congestive heart failure with bilateral pleural effusions and adjacent airspace disease.   Electronically Signed   By: Jeronimo Greaves M.D.   On: 02/25/2013 07:44   Portable Chest Xray  02/24/2013   CLINICAL DATA:  Endotracheal tube placement.  EXAM: PORTABLE CHEST - 1 VIEW  COMPARISON:  02/24/2013  FINDINGS: Endotracheal tube is 2.7 cm above the carina, satisfactorily positioned.  A nasogastric tube enters the stomach.  Pacer leads remain in place.  Bilateral interstitial opacity noted with increasing perihilar and basilar airspace opacities. There is obscuration of the left hemidiaphragm.  IMPRESSION: 1. Endotracheal tube satisfactorily position, 2.7 cm above the carina. 2. Increased airspace opacity especially at the left lung base and also in the left perihilar region, with underlying interstitial opacity. Appearance may reflect asymmetric pulmonary edema, atelectasis, or pneumonia. 3. Nasogastric tube is been placed, and extends into the stomach.   Electronically Signed   By: Herbie Baltimore M.D.   On: 02/24/2013 21:28   Dg Chest Port 1 View  02/24/2013   CLINICAL DATA:  Dyspnea, cough, palpitations  EXAM: PORTABLE CHEST - 1 VIEW  COMPARISON:  03/03/2012  FINDINGS: Cardiomegaly again noted. Dual lead cardiac pacemaker is unchanged in position. Central mild vascular congestion and mild interstitial prominence bilaterally suspicious for mild interstitial edema. No segmental infiltrate. Mild left basilar atelectasis.  IMPRESSION: Central mild vascular congestion and mild interstitial prominence bilaterally suspicious for mild interstitial edema. No segmental infiltrate. Mild left basilar atelectasis.    Electronically Signed   By: Natasha Mead M.D.   On: 02/24/2013 13:04   Dg Abd Portable 1v  02/24/2013   CLINICAL DATA:  Vomiting. Hypertension.  EXAM: PORTABLE ABDOMEN - 1 VIEW  COMPARISON:  None.  FINDINGS: Pacer leads project over the cardiac shadow. Body habitus reduces diagnostic sensitivity and specificity. Cardiomegaly noted.  Supine views of the abdomen demonstrate formed stool in the colon. No definite dilated loops of bowel but significant portions of the bowel are gasless.  IMPRESSION: 1. No specific abnormality observed in the bowel gas pattern, but significant portions of the bowel are gasless which can reduce negative predictive value. There is formed stool in portions of the colon. 2. Cardiomegaly. 3.  Body habitus reduces diagnostic sensitivity and specificity.   Electronically Signed   By: Herbie Baltimore M.D.   On: 02/24/2013 20:41    Medications: I have reviewed the patient's current medications.  Assesment: Ventilatory dependent respiratory failure Principal Problem:   Acute on chronic diastolic heart failure Active Problems:   DM (diabetes mellitus), type 2, uncontrolled with complications   HYPERLIPIDEMIA   OBESITY, MORBID   Anemia, iron deficiency   SLEEP APNEA, OBSTRUCTIVE   HYPERTENSION   CORONARY ARTERY DISEASE   GERD   PACEMAKER, PERMANENT   Obesity hypoventilation syndrome   SVT (supraventricular tachycardia)   Acute respiratory failure    Plan: Medications reviewed Will decrease her PEEP and start weaning trial according oprotocol Will monitor ABG Cardiology consult appreciated.    LOS: 2 days   Eliyohu Class 02/26/2013, 8:02 AM

## 2013-02-26 NOTE — Progress Notes (Signed)
Consulting cardiologist: Wyline Mood., MD  Subjective:   Intubated but awake and alert. Denies any pain. Able to communicate with yes and no head nods and by writing.    Objective:   Temp:  [98.9 F (37.2 C)-99.9 F (37.7 C)] 98.9 F (37.2 C) (10/09 0400) Pulse Rate:  [60-92] 68 (10/09 0600) Resp:  [22-24] 22 (10/09 0323) BP: (129-172)/(58-93) 146/86 mmHg (10/09 0600) SpO2:  [92 %-100 %] 99 % (10/09 0600) FiO2 (%):  [40 %] 40 % (10/09 0326) Weight:  [415 lb 12.8 oz (188.606 kg)] 415 lb 12.8 oz (188.606 kg) (10/09 0500) Last BM Date: 02/23/13  Filed Weights   02/24/13 1748 02/25/13 0600 02/26/13 0500  Weight: 427 lb 12.8 oz (194.049 kg) 430 lb 3.2 oz (195.137 kg) 415 lb 12.8 oz (188.606 kg)    Intake/Output Summary (Last 24 hours) at 02/26/13 0755 Last data filed at 02/26/13 0600  Gross per 24 hour  Intake  379.5 ml  Output   1125 ml  Net -745.5 ml    Telemetry: SR with RBBB rates in the 60's and 70's.   Exam:  General: No acute distress. Intubated.  HEENT: Conjunctiva and lids normal, oropharynx clear.  Lungs: Inspiratory wheezes, diminished in the bases, difficult to assess due to body habitus and intubation.  Cardiac: No elevated JVP or bruits. Neck obese, difficult to assess. RRR, distant heart sounds, no gallop or rub.   Abdomen: Normoactive bowel sounds, nontender, nondistended.OG tube in place with nutrition.  Extremities: No pitting edema, distal pulses full.  Neuropsychiatric: Alert and oriented x3, affect appropriate.   Lab Results:  Basic Metabolic Panel:  Recent Labs Lab 02/24/13 1209 02/25/13 0500 02/26/13 0423  NA 142 140 142  K 4.4 4.0 3.2*  CL 98 97 99  CO2 34* 34* 34*  GLUCOSE 183* 138* 150*  BUN 19 21 21   CREATININE 1.33* 1.45* 1.52*  CALCIUM 9.6 9.4 9.3    Liver Function Tests:  Recent Labs Lab 02/26/13 0423  AST 17  ALT 17  ALKPHOS 74  BILITOT 0.3  PROT 6.5  ALBUMIN 2.9*    CBC:  Recent Labs Lab 02/24/13 1209  02/25/13 0500 02/26/13 0423  WBC 11.6* 11.1* 11.0*  HGB 9.9* 9.1* 8.0*  HCT 31.1* 28.9* 25.4*  MCV 91.5 91.2 89.4  PLT 296 286 256    Cardiac Enzymes:  Recent Labs Lab 02/24/13 1209 02/24/13 1831 02/24/13 2256  TROPONINI <0.30 <0.30 <0.30    BNP:  Recent Labs  02/24/13 1341  PROBNP 938.5*   Echocardiogram: Pending Oct/2013 Left ventricle: The cavity size was normal. Wall thickness was increased in a pattern of moderate to severe LVH. Systolic function was normal. The estimated ejection fraction was in the range of 60% to 65%. Although no diagnostic regional wall motion abnormality was identified, this possibility cannot be completely excluded on the basis of this study. Features are consistent with a pseudonormal left ventricular filling pattern, with concomitant abnormal relaxation and increased filling pressure (grade 2 diastolic dysfunction). - Aortic valve: Trivial regurgitation. - Aortic root: The aortic root was mildly ectatic. - Ascending aorta: The ascending aorta was mildly dilated. - Mitral valve: Calcified annulus. Trivial regurgitation. - Left atrium: The atrium was at the upper limits of normal in size. - Tricuspid valve: Trivial regurgitation. - Pulmonary arteries: Systolic pressure could not be accurately estimated. - Pericardium, extracardiac: There was no pericardial effusion.   Radiology: Portable Chest Xray In Am  02/25/2013   CLINICAL DATA:  Respiratory failure  and intubation.  EXAM: PORTABLE CHEST - 1 VIEW  COMPARISON:  02/24/2013  FINDINGS: Endotracheal tube appropriately positioned level of carina. Nasogastric tube poorly visualized distally. Likely extends beyond the inferior aspect of the film.  Pacer is unchanged in position. Cardiomegaly accentuated by AP portable technique. Probable small bilateral pleural effusions. No pneumothorax. Mild interstitial edema is similar. Left greater than right bibasilar airspace disease.  IMPRESSION: No  significant change in congestive heart failure with bilateral pleural effusions and adjacent airspace disease.   Electronically Signed   By: Jeronimo Greaves M.D.   On: 02/25/2013 07:44   Portable Chest Xray  02/24/2013   CLINICAL DATA:  Endotracheal tube placement.  EXAM: PORTABLE CHEST - 1 VIEW  COMPARISON:  02/24/2013  FINDINGS: Endotracheal tube is 2.7 cm above the carina, satisfactorily positioned.  A nasogastric tube enters the stomach.  Pacer leads remain in place.  Bilateral interstitial opacity noted with increasing perihilar and basilar airspace opacities. There is obscuration of the left hemidiaphragm.  IMPRESSION: 1. Endotracheal tube satisfactorily position, 2.7 cm above the carina. 2. Increased airspace opacity especially at the left lung base and also in the left perihilar region, with underlying interstitial opacity. Appearance may reflect asymmetric pulmonary edema, atelectasis, or pneumonia. 3. Nasogastric tube is been placed, and extends into the stomach.   Electronically Signed   By: Herbie Baltimore M.D.   On: 02/24/2013 21:28       Medications:   Scheduled Medications: . albuterol  2.5 mg Nebulization Q4H  . antiseptic oral rinse  15 mL Mouth Rinse QID  . carvedilol  25 mg Oral BID WC  . chlorhexidine  15 mL Mouth Rinse BID  . cloNIDine  0.2 mg Oral BID  . diltiazem  30 mg Oral Q6H  . insulin aspart  0-20 Units Subcutaneous Q4H  . pantoprazole (PROTONIX) IV  40 mg Intravenous Daily     Infusions: . feeding supplement (GLUCERNA 1.2 CAL) 1,000 mL (02/26/13 0600)     PRN Medications:  acetaminophen, acetaminophen, fentaNYL, midazolam, ondansetron (ZOFRAN) IV, ondansetron, promethazine   Assessment and Plan:   1. VDRF: Secondary to hypercarbia with Pickwickian Syndrome. She is now intubated and is stable. She offers no complaints of dyspnea and is anxious to have the ET removed. She is tolerating it ok.  Lung sounds are diminished, with inspiratory wheezes. Defer to  Dr. Juanetta Gosling for weaning protocol.  2. Acute on Chronic Diastolic CHF with possible pulmonary edema: Pro-BNP 938.5 on admission, with CXR on 02/25/2013 demonstrating CHF, with interstitial edema. She is now on po torsemide at home with spironolactone. Review of current medication list does not have her on diuretics now. She admits to eating salty foods at home prior to coming in. I will restart torsemide 50 mg BID and consider re-starting spironolactone as she was taking at home (after assessment of BMET in am), via OG tube. Potassium is low this am, and is being repleted.Creatinine is 1.52. Echo is pending.  3. Hypertension: Currently elevated. Remains on clonidine, carvedilolol, and diltiazem. With restart of diuretics, will re-assess her status.   4, Anemia: Question dilutional with CHF. Per anemia profile completed 2012, she is iron deficient. This is being repeated   Bettey Mare. Lyman Bishop NP [facility] 02/26/2013, 7:55 AM  Patient seen and discussed with NP Lyman Bishop. Elevated blood pressures overnight, patient was on IV bp meds yesterday, transitioned to some of her oral home meds yesterday. She is on quite an extensive bp regimen at home, did  not want to restart all at one out of fear of bottoming her out. Will start her oral hydralazine today which she takes 100mg  4 times a day. From a diastolic CHF standpoint, she was negative yesterday with a slight increase in her Cr. Agree with resuming her home maintanence regimen today, would not try to aggressively diurese her currently. She has a history of SVT, with well controlled rates on coreg and dilt. Will follow up her echo results.   Dina Rich MD

## 2013-02-27 ENCOUNTER — Inpatient Hospital Stay (HOSPITAL_COMMUNITY): Payer: Medicaid Other

## 2013-02-27 LAB — BLOOD GAS, ARTERIAL
Acid-Base Excess: 8.8 mmol/L — ABNORMAL HIGH (ref 0.0–2.0)
Bicarbonate: 33.9 mEq/L — ABNORMAL HIGH (ref 20.0–24.0)
O2 Saturation: 97.5 %
PEEP: 5 cmH2O
RATE: 22 resp/min
pCO2 arterial: 57.2 mmHg (ref 35.0–45.0)
pH, Arterial: 7.391 (ref 7.350–7.450)
pO2, Arterial: 107 mmHg — ABNORMAL HIGH (ref 80.0–100.0)

## 2013-02-27 LAB — CBC
HCT: 27.8 % — ABNORMAL LOW (ref 36.0–46.0)
Hemoglobin: 8.7 g/dL — ABNORMAL LOW (ref 12.0–15.0)
MCHC: 31.3 g/dL (ref 30.0–36.0)
MCV: 90 fL (ref 78.0–100.0)
Platelets: 265 10*3/uL (ref 150–400)
RDW: 17.9 % — ABNORMAL HIGH (ref 11.5–15.5)
WBC: 11.6 10*3/uL — ABNORMAL HIGH (ref 4.0–10.5)

## 2013-02-27 LAB — BASIC METABOLIC PANEL
BUN: 21 mg/dL (ref 6–23)
CO2: 34 mEq/L — ABNORMAL HIGH (ref 19–32)
Chloride: 100 mEq/L (ref 96–112)
Creatinine, Ser: 1.52 mg/dL — ABNORMAL HIGH (ref 0.50–1.10)
Glucose, Bld: 168 mg/dL — ABNORMAL HIGH (ref 70–99)
Potassium: 3.6 mEq/L (ref 3.5–5.1)

## 2013-02-27 LAB — GLUCOSE, CAPILLARY
Glucose-Capillary: 151 mg/dL — ABNORMAL HIGH (ref 70–99)
Glucose-Capillary: 154 mg/dL — ABNORMAL HIGH (ref 70–99)
Glucose-Capillary: 154 mg/dL — ABNORMAL HIGH (ref 70–99)
Glucose-Capillary: 159 mg/dL — ABNORMAL HIGH (ref 70–99)
Glucose-Capillary: 166 mg/dL — ABNORMAL HIGH (ref 70–99)

## 2013-02-27 MED ORDER — FUROSEMIDE 10 MG/ML IJ SOLN
60.0000 mg | Freq: Three times a day (TID) | INTRAMUSCULAR | Status: DC
  Administered 2013-02-27 – 2013-03-03 (×13): 60 mg via INTRAVENOUS
  Filled 2013-02-27 (×13): qty 6

## 2013-02-27 MED ORDER — POTASSIUM CHLORIDE 20 MEQ/15ML (10%) PO LIQD
40.0000 meq | Freq: Every day | ORAL | Status: DC
  Administered 2013-02-27 – 2013-03-03 (×4): 40 meq
  Filled 2013-02-27 (×4): qty 30

## 2013-02-27 MED ORDER — TORSEMIDE 20 MG/2ML IV SOLN: 50.0000 mg | Freq: Two times a day (BID) | INTRAVENOUS | Status: DC

## 2013-02-27 MED ORDER — TORSEMIDE 20 MG PO TABS: 50.0000 mg | ORAL_TABLET | Freq: Two times a day (BID) | ORAL | Status: DC

## 2013-02-27 MED ORDER — FERROUS SULFATE 300 (60 FE) MG/5ML PO SYRP
300.0000 mg | ORAL_SOLUTION | Freq: Two times a day (BID) | ORAL | Status: DC
  Administered 2013-02-27 – 2013-03-24 (×48): 300 mg
  Filled 2013-02-27 (×58): qty 5

## 2013-02-27 NOTE — Progress Notes (Signed)
At approximately 1530 while giving patient a bath, patient became very upset and motioned that she felt like she was choking and could not breath.  Enteral feeding paused and attempted to calm patient. Also attempted inline suctioning with no relief. RT called and responded and changed inline suctioning circuit and breathing treatment given. Gave patient fentanyl and versed to calm her down. RN went to check placement of tube but tube had pulled out. Replaced with new OG 31F. Placement verified by auscultation and CXR pending. Will resume tube feedings when confirmation of tube placement is verified. Patient is now resting comfortably with family at bedside.

## 2013-02-27 NOTE — Progress Notes (Signed)
Consulting cardiologist: Dina Rich MD  Subjective:    Intubated. No complaints of chest pain.   Objective:   Temp:  [97.2 F (36.2 C)-98.9 F (37.2 C)] 98.5 F (36.9 C) (10/10 0730) Pulse Rate:  [59-160] 60 (10/10 0600) Resp:  [18-28] 22 (10/09 1830) BP: (103-190)/(55-92) 139/71 mmHg (10/10 0624) SpO2:  [91 %-100 %] 99 % (10/10 0600) FiO2 (%):  [35 %-40 %] 35 % (10/10 0416) Weight:  [409 lb 2.8 oz (185.6 kg)] 409 lb 2.8 oz (185.6 kg) (10/10 0500) Last BM Date: 02/23/13  Filed Weights   02/25/13 0600 02/26/13 0500 02/27/13 0500  Weight: 430 lb 3.2 oz (195.137 kg) 415 lb 12.8 oz (188.606 kg) 409 lb 2.8 oz (185.6 kg)    Intake/Output Summary (Last 24 hours) at 02/27/13 0756 Last data filed at 02/27/13 0600  Gross per 24 hour  Intake 1307.67 ml  Output   2100 ml  Net -792.33 ml    Telemetry: AV pacing. Rapid SVT yesterday. No recurrence since.   Exam:  General: No acute distress.  HEENT: Conjunctiva and lids normal, oropharynx clear.  Lungs: Inspiratory wheezes, with diminished sounds in the bases, crackles in the middle lobes. Frothy, thick secretions in the ET tube.  Cardiac: No elevated JVP or bruits. RRR, distant heart sounds, no gallop or rub.   Abdomen: Normoactive bowel sounds, nontender, nondistended.  Extremities: No pitting edema, distal pulses full.  Neuropsychiatric: Alert and oriented x3, affect appropriate.   Lab Results:  Basic Metabolic Panel:  Recent Labs Lab 02/25/13 0500 02/26/13 0423 02/27/13 0455  NA 140 142 143  K 4.0 3.2* 3.6  CL 97 99 100  CO2 34* 34* 34*  GLUCOSE 138* 150* 168*  BUN 21 21 21   CREATININE 1.45* 1.52* 1.52*  CALCIUM 9.4 9.3 9.3    Liver Function Tests:  Recent Labs Lab 02/26/13 0423  AST 17  ALT 17  ALKPHOS 74  BILITOT 0.3  PROT 6.5  ALBUMIN 2.9*    CBC:  Recent Labs Lab 02/25/13 0500 02/26/13 0423 02/27/13 0455  WBC 11.1* 11.0* 11.6*  HGB 9.1* 8.0* 8.7*  HCT 28.9* 25.4* 27.8*   MCV 91.2 89.4 90.0  PLT 286 256 265    Cardiac Enzymes:  Recent Labs Lab 02/24/13 1209 02/24/13 1831 02/24/13 2256  TROPONINI <0.30 <0.30 <0.30    BNP:  Recent Labs  02/24/13 1341  PROBNP 938.5*   Echocardiogram:02/26/2013 Study data: Technically difficult study, poor acoustic windows. Patient is obese and on ventilator. - Left ventricle: The cavity size was normal. Wall thickness was increased in a pattern of moderate LVH. Systolic function was normal. The estimated ejection fraction was in the range of 60% to 65%. The study is not technically sufficient to allow evaluation of LV diastolic function. - Left atrium: The atrium was mildly to moderately dilated.   Radiology: 02/25/2013 FINDINGS: Endotracheal tube appropriately positioned level of carina. Nasogastric tube poorly visualized distally. Likely extends beyond the inferior aspect of the film.  Pacer is unchanged in position. Cardiomegaly accentuated by AP portable technique. Probable small bilateral pleural effusions. No pneumothorax. Mild interstitial edema is similar. Left greater than right bibasilar airspace disease.  IMPRESSION: No significant change in congestive heart failure with bilateral pleural effusions and adjacent airspace disease.    Medications:   Scheduled Medications: . albuterol  2.5 mg Nebulization Q4H  . antiseptic oral rinse  15 mL Mouth Rinse QID  . carvedilol  25 mg Oral BID WC  . chlorhexidine  15 mL Mouth Rinse BID  . cloNIDine  0.2 mg Oral BID  . diltiazem  30 mg Oral Q6H  . hydrALAZINE  100 mg Oral Q6H  . insulin aspart  0-20 Units Subcutaneous Q4H  . pantoprazole (PROTONIX) IV  40 mg Intravenous Daily  . torsemide  50 mg Per Tube BID     Infusions: . feeding supplement (GLUCERNA 1.2 CAL) 1,000 mL (02/27/13 0600)     PRN Medications:  acetaminophen, acetaminophen, fentaNYL, midazolam, ondansetron (ZOFRAN) IV, ondansetron, promethazine   Assessment and Plan:    1. VDRF: Thick secretions noted with frequent suctioning needed per nurses report. Repeat CXR is being done this am. Plans for weaning protocol per Dr. Felecia Shelling. Await CXR first.   2. Acute on Chronic Diastolic CHF: Restarted demedex PO yesterday. She is diuresing now with 9 lb wt loss overnight. Uncertain of accuracy, as I/O  Only negative 757cc. Diuresed 2100 cc per f/c output. Creatinine 1.52 with potassium 3.6. Will add back po daily potassium since demedex is back on board. Will hold off on spironolactone for now.   3. SVT: Transient yesterday, rates up to 150 bpm. Subsided with IV cardizem. She remains on PO cardizem 30 mg Q 6 hours and coreg 25 mg BID. No recurrence since yesterday. BP is stable.   4. Anemia: Profile demonstrates iron deficiency. Iron level low at 15. On ferrous sulfate at home. Should restart. Hgb slightly improved this am from 8.0 to 8.7. Suspect some dilutional aspect with fluid overload.  ADDENDUM: CXR this am demonstrates pulmonary edema. Will change demedex to lasix 60 mg IV TID and follow I/O and BMET.   Bettey Mare. Lyman Bishop NP [facility] 02/27/2013, 7:56 AM  Attending Note Patient seen and discussed with NP Lyman Bishop. Transient episode of SVT that resolved with IV diltiazem. She mild net negative yesterday with stable renal function. Cannot interpret her volume status based on exam due to body habitus, some evidence of pulm edema on chest xray. She seems to be oxygenating very well on fairly low FiO2 on the vent (PaO2 107%), so I do not suspect she is massively volume overloaded. Agree with transitioning to IV lasix. Continue beta blocker and dilt to help prevent SVT. Blood pressures better controlled now that we have gotten her back up to her full home regimen.    Dina Rich MD

## 2013-02-27 NOTE — Progress Notes (Signed)
RT attempted wean this am, patient unable to do due to increase WOB 35-38 RR and low exhaled tidal volumes on CPAP/PS 5/5 40% FIO2. RT increased PS to 15 and patient tolerated for approximately 7hrs. Patient became very agitated with increased WOB 40-44. RT placed patient back on rest mode for remainder of the shift PRVC  VT 400, RR 22, PEEP 5 and FIO2 35%. Patient tolerating settings well at this time. RT changed patient's ventilator ballard circuit 2X along with HME due to moderate amount of white frothy secretions. RT also suctioned patient throughout the shift for copious amounts of frothy white secretions. Family at bedside

## 2013-02-27 NOTE — Progress Notes (Signed)
Subjective:  Patient is awake and alert. She is responding to verbal commmunication. She is orally intubated..   Objective: Vital signs in last 24 hours: Temp:  [97.2 F (36.2 C)-98.9 F (37.2 C)] 97.2 F (36.2 C) (10/10 0400) Pulse Rate:  [59-160] 60 (10/10 0600) Resp:  [18-28] 22 (10/09 1830) BP: (103-190)/(55-92) 139/71 mmHg (10/10 0624) SpO2:  [91 %-100 %] 99 % (10/10 0600) FiO2 (%):  [35 %-40 %] 35 % (10/10 0416) Weight:  [185.6 kg (409 lb 2.8 oz)] 185.6 kg (409 lb 2.8 oz) (10/10 0500) Weight change: -3.006 kg (-6 lb 10 oz) Last BM Date: 02/23/13  Intake/Output from previous day: 10/09 0701 - 10/10 0700 In: 1307.7 [NG/GT:1107.7; IV Piggyback:200] Out: 2100 [Urine:2100]  PHYSICAL EXAM General appearance: moderately obese and slowed mentation Resp: diminished breath sounds bilaterally and rhonchi bilaterally Cardio: S1 and S2 heard bradycardiac GI: soft, non-tender; bowel sounds normal; no masses,  no organomegaly Extremities: 2++ edema  Lab Results:    @labtest @ ABGS  Recent Labs  02/26/13 1530  PHART 7.385  PO2ART 108.0*  TCO2 30.7  HCO3 32.7*   CULTURES Recent Results (from the past 240 hour(s))  MRSA PCR SCREENING     Status: None   Collection Time    02/24/13  5:30 PM      Result Value Range Status   MRSA by PCR NEGATIVE  NEGATIVE Final   Comment:            The GeneXpert MRSA Assay (FDA     approved for NASAL specimens     only), is one component of a     comprehensive MRSA colonization     surveillance program. It is not     intended to diagnose MRSA     infection nor to guide or     monitor treatment for     MRSA infections.   Studies/Results: No results found.  Medications: I have reviewed the patient's current medications.  Assesment: Ventilatory dependent respiratory failure Principal Problem:   Acute on chronic diastolic heart failure Active Problems:   DM (diabetes mellitus), type 2, uncontrolled with complications  HYPERLIPIDEMIA   OBESITY, MORBID   Anemia, iron deficiency   SLEEP APNEA, OBSTRUCTIVE   HYPERTENSION   CORONARY ARTERY DISEASE   GERD   PACEMAKER, PERMANENT   Obesity hypoventilation syndrome   SVT (supraventricular tachycardia)   Acute respiratory failure    Plan: Medications reviewed Will repeat chest x-ray today Will start weaning according to protocol.    LOS: 3 days   [name] 02/27/2013, 7:42 AM

## 2013-02-27 NOTE — Progress Notes (Signed)
Dr. Felecia Shelling notified of patients new ABGs and CXR results. MD gave TO to change torsemide to IV at same dose and frequency.

## 2013-02-27 NOTE — Progress Notes (Signed)
CRITICAL VALUE ALERT  Critical value received:  Co2 57.2  Date of notification:  02/27/2013  Time of notification:  0853  Critical value read back:yes  Nurse who received alert:  N.Britani Beattie,RN  MD notified (1st page):  [place]  Time of first page:  [phone]  MD notified (2nd page):  Time of second page:  Responding MD:  Felecia Shelling  Time MD responded:  [phone]

## 2013-02-28 ENCOUNTER — Inpatient Hospital Stay (HOSPITAL_COMMUNITY): Payer: Medicaid Other

## 2013-02-28 LAB — BLOOD GAS, ARTERIAL
Acid-Base Excess: 10.6 mmol/L — ABNORMAL HIGH (ref 0.0–2.0)
Acid-Base Excess: 7.2 mmol/L — ABNORMAL HIGH (ref 0.0–2.0)
Bicarbonate: 35.3 mEq/L — ABNORMAL HIGH (ref 20.0–24.0)
Bicarbonate: 35.6 mEq/L — ABNORMAL HIGH (ref 20.0–24.0)
Bicarbonate: 34.7 mEq/L — ABNORMAL HIGH (ref 20.0–24.0)
Drawn by: 22223
Drawn by: 25788
Drawn by: 25788
Expiratory PAP: 10
FIO2: 35 %
FIO2: 30 %
FIO2: 70 %
FIO2: 50 %
Inspiratory PAP: 18
MECHVT: 400 mL
Mode: POSITIVE
Mode: POSITIVE
O2 Saturation: 97.6 %
O2 Saturation: 93.7 %
O2 Saturation: 83.2 %
O2 Saturation: 98.1 %
PEEP: 5 cmH2O
Patient temperature: 37
Patient temperature: 37
Patient temperature: 37
Pressure support: 5 cmH2O
RATE: 22 resp/min
RATE: 12 resp/min
TCO2: 32.2 mmol/L (ref 0–100)
TCO2: 33.7 mmol/L (ref 0–100)
TCO2: 31.7 mmol/L (ref 0–100)
pCO2 arterial: 152 mmHg (ref 35.0–45.0)
pH, Arterial: 7.432 (ref 7.350–7.450)
pH, Arterial: 7.415 (ref 7.350–7.450)
pO2, Arterial: 117 mmHg — ABNORMAL HIGH (ref 80.0–100.0)

## 2013-02-28 LAB — BASIC METABOLIC PANEL
BUN: 24 mg/dL — ABNORMAL HIGH (ref 6–23)
CO2: 36 mEq/L — ABNORMAL HIGH (ref 19–32)
Calcium: 9.6 mg/dL (ref 8.4–10.5)
Creatinine, Ser: 1.68 mg/dL — ABNORMAL HIGH (ref 0.50–1.10)
Glucose, Bld: 196 mg/dL — ABNORMAL HIGH (ref 70–99)

## 2013-02-28 LAB — GLUCOSE, CAPILLARY
Glucose-Capillary: 180 mg/dL — ABNORMAL HIGH (ref 70–99)
Glucose-Capillary: 186 mg/dL — ABNORMAL HIGH (ref 70–99)
Glucose-Capillary: 210 mg/dL — ABNORMAL HIGH (ref 70–99)
Glucose-Capillary: 248 mg/dL — ABNORMAL HIGH (ref 70–99)

## 2013-02-28 MED ORDER — BIOTENE DRY MOUTH MT LIQD
15.0000 mL | Freq: Four times a day (QID) | OROMUCOSAL | Status: DC
  Administered 2013-02-28 – 2013-03-05 (×19): 15 mL via OROMUCOSAL

## 2013-02-28 MED ORDER — LORAZEPAM 2 MG/ML IJ SOLN
0.5000 mg | Freq: Four times a day (QID) | INTRAMUSCULAR | Status: DC
  Administered 2013-02-28: 0.5 mg via INTRAVENOUS
  Filled 2013-02-28: qty 1

## 2013-02-28 MED ORDER — METHYLPREDNISOLONE SODIUM SUCC 125 MG IJ SOLR
125.0000 mg | Freq: Four times a day (QID) | INTRAMUSCULAR | Status: DC
  Administered 2013-02-28 – 2013-03-04 (×16): 125 mg via INTRAVENOUS
  Filled 2013-02-28 (×20): qty 2

## 2013-02-28 MED ORDER — CHLORHEXIDINE GLUCONATE 0.12 % MT SOLN: 15.0000 mL | Freq: Two times a day (BID) | OROMUCOSAL | Status: DC

## 2013-02-28 MED ORDER — FENTANYL CITRATE 0.05 MG/ML IJ SOLN
50.0000 ug | INTRAMUSCULAR | Status: DC | PRN
  Administered 2013-02-28 – 2013-03-03 (×11): 100 ug via INTRAVENOUS
  Filled 2013-02-28 (×12): qty 2

## 2013-02-28 MED ORDER — LORAZEPAM 2 MG/ML IJ SOLN: 0.5000 mg | Freq: Four times a day (QID) | INTRAMUSCULAR | Status: DC | PRN

## 2013-02-28 MED ORDER — MIDAZOLAM HCL 2 MG/2ML IJ SOLN
2.0000 mg | INTRAMUSCULAR | Status: DC | PRN
  Administered 2013-02-28: 2 mg via INTRAVENOUS
  Administered 2013-02-28: 4 mg via INTRAVENOUS
  Administered 2013-03-01: 2 mg via INTRAVENOUS
  Administered 2013-03-01 – 2013-03-02 (×4): 4 mg via INTRAVENOUS
  Administered 2013-03-02 – 2013-03-03 (×2): 2 mg via INTRAVENOUS
  Filled 2013-02-28 (×2): qty 4
  Filled 2013-02-28: qty 2
  Filled 2013-02-28 (×3): qty 4
  Filled 2013-02-28 (×4): qty 2

## 2013-02-28 MED ORDER — RACEPINEPHRINE HCL 2.25 % IN NEBU
0.5000 mL | INHALATION_SOLUTION | Freq: Once | RESPIRATORY_TRACT | Status: AC
  Administered 2013-02-28: 0.5 mL via RESPIRATORY_TRACT

## 2013-02-28 MED ORDER — RACEPINEPHRINE HCL 2.25 % IN NEBU
INHALATION_SOLUTION | RESPIRATORY_TRACT | Status: AC
  Filled 2013-02-28: qty 0.5

## 2013-02-28 NOTE — Code Documentation (Signed)
[facility]  Department of Emergency Medicine   Emergent Intubation CONSULT NOTE  Chief Complaint: Hypercapnic Respiratory Failure  Level V Caveat: Unresponsive  History of present illness: I was contacted by the hospital for an emergency intubation upstairs and presented to the patient's bedside.  Patient is a obese female with history of COPD. She was recently extubated this morning. She's he was COPD. She was talking doing well. She then developed some stridor, more to rest for a stress, and altered mental status. An ABG showed hypercapnic respiratory failure. She's put on BiPAP and is tolerating it well with normal blood pressures, and maintaining oxygen saturations in the high mid 90s. I was asked to help with emergently intubated the patient. General surgeon is helping his over an hour away. There is nursing anesthestist to help on the way also.  ROS: Unable to obtain, Level V caveat  Scheduled Meds: . albuterol  2.5 mg Nebulization Q4H  . antiseptic oral rinse  15 mL Mouth Rinse QID  . antiseptic oral rinse  15 mL Mouth Rinse QID  . carvedilol  25 mg Oral BID WC  . chlorhexidine  15 mL Mouth Rinse BID  . cloNIDine  0.2 mg Oral BID  . diltiazem  30 mg Oral Q6H  . ferrous sulfate  300 mg Per Tube BID WC  . furosemide  60 mg Intravenous TID  . hydrALAZINE  100 mg Oral Q6H  . insulin aspart  0-20 Units Subcutaneous Q4H  . methylPREDNISolone (SOLU-MEDROL) injection  125 mg Intravenous Q6H  . pantoprazole (PROTONIX) IV  40 mg Intravenous Daily  . potassium chloride  40 mEq Per Tube Daily  . Racepinephrine HCl       Continuous Infusions: . feeding supplement (GLUCERNA 1.2 CAL) Stopped (02/28/13 0930)   PRN Meds:.acetaminophen, acetaminophen, LORazepam, ondansetron (ZOFRAN) IV, ondansetron, promethazine Past Medical History  Diagnosis Date  . Morbid obesity   . Essential hypertension, benign   . Chronic diastolic heart failure     LVEF 60-65% 2011  . COPD  (chronic obstructive pulmonary disease)     Oxygen dependent  . History of cardiac catheterization     Normal coronary arteries 2008  . Obesity hypoventilation syndrome   . Arthritis   . Hiatal hernia   . Symptomatic bradycardia     With pauses 2008 - Followed by Dr. Graciela Husbands  . Cellulitis     Recurrent bilateral LE  . Mixed hyperlipidemia   . Peripheral vascular disease   . Myocardial infarction 1990's  . Sleep apnea   . Type II diabetes mellitus   . Gout   . Nonsustained ventricular tachycardia     Documented 2008  . SVT (supraventricular tachycardia)     Reported possible atrial arrhythmias   Past Surgical History  Procedure Laterality Date  . Abdominal hysterectomy  1980's  . Insert / replace / remove pacemaker  2010   History   Social History  . Marital Status: Married    Spouse Name: N/A    Number of Children: N/A  . Years of Education: N/A   Occupational History  . Not on file.   Social History Main Topics  . Smoking status: Former Smoker -- 0.50 packs/day for .1 years    Types: Cigarettes    Quit date: 05/22/1967  . Smokeless tobacco: Never Used  . Alcohol Use: No  . Drug Use: No  . Sexual Activity: No   Other Topics Concern  . Not on file   Social  History Narrative   Supported by her husband. Pt unemployed.   Allergies  Allergen Reactions  . Morphine And Related Hives    Last set of Vital Signs (not current) Filed Vitals:   02/28/13 1039  BP: 143/66  Pulse: 76  Temp:   Resp:       Physical Exam  Gen: unresponsive Cardiovascular: Heart sounds normal Resp: Labored breathing. Some stridor present. Breath sounds equal on BiPAP  Abd: nondistended  Neuro: GCS 3, unresponsive to pain  HEENT: No blood in posterior pharynx, gag reflex absent  Neck: No crepitus  Musculoskeletal: No deformity  Skin: warm  Procedures  INTUBATION Performed by: Dagmar Hait Required items: required blood products, implants, devices, and special  equipment available Patient identity confirmed: provided demographic data and hospital-assigned identification number Time out: Immediately prior to procedure a "time out" was called to verify the correct patient, procedure, equipment, support staff and site/side marked as required. Indications: Acute hypercapnic respiratory failure  Intubation method: Glide scope for. Was unable to insert a 7-0 tube. I slid a pediatric bougie through the tube and through the cords. I then removed the 7-0 tube and placed a 6-0 endotracheal tube in her airway. Preoxygenation: BiPAP  Sedatives: Etomidate, 10 mg  Paralytic: None  Tube Size: 6-0 cuffed Post-procedure assessment: chest rise and ETCO2 monitor Breath sounds: equal and absent over the epigastrium Tube secured by Respiratory Therapy Patient tolerated the procedure well with no immediate complications.  At the bedside and intubation was Dillard Cannon, a nurse anesthetist CRITICAL CARE Performed by: Dagmar Hait Total critical care time: 45 minutes Critical care time was exclusive of separately billable procedures and treating other patients. Critical care was necessary to treat or prevent imminent or life-threatening deterioration. Critical care was time spent personally by me on the following activities: development of treatment plan with patient and/or surrogate as well as nursing, discussions with consultants, evaluation of patient's response to treatment, examination of patient, obtaining history from patient or surrogate, ordering and performing treatments and interventions, ordering and review of laboratory studies, ordering and review of radiographic studies, pulse oximetry and re-evaluation of patient's condition.  Medical Decision making  I performed an awake intubation due to patient's morbid obesity. She also be a very difficult cricothyrotomy. She was given light sedation with etomidate tolerated procedure well. With her stridor, is  concerned about subglottic stenosis, so I had to use and smaller endotracheal tube is at the bedside.   Assessment and Plan  Patient intubated for hypercapnic respiratory failure. Endotracheal tube with good bilateral breath sounds after placement. Chest x-ray ordered. Sats 100% after tube placed.

## 2013-02-28 NOTE — Progress Notes (Signed)
Dr. Felecia Shelling paged to get order for racemic at RT's request. Patient significantly more wheezy than at the time of extubation. 126 mg IV solu-medrol given approx 1 hour ago. RT and RN at bedside, Patient's son in/out of room intermittantly.

## 2013-02-28 NOTE — Procedures (Signed)
Intubation Procedure Note [name] [id number] [birth year]  Procedure: Intubation Indications: Airway protection and maintenance  Procedure Details Consent: Risks of procedure as well as the alternatives and risks of each were explained to the (patient/caregiver).  Consent for procedure obtained. Time Out: Verified patient identification, verified procedure, site/side was marked, verified correct patient position, special equipment/implants available, medications/allergies/relevent history reviewed, required imaging and test results available.  Performed  Maximum sterile technique was used including antiseptics, cap, gloves, hand hygiene and mask.  MAC and 4    Evaluation Hemodynamic Status: BP stable throughout; O2 sats: stable throughout Patient's Current Condition: stable Complications: No apparent complications Patient did tolerate procedure well. Chest X-ray ordered to verify placement.  CXR: pending.   [name] 02/28/2013

## 2013-02-28 NOTE — Progress Notes (Signed)
Called by RT notifying me of critical ABG and that patient needs to be intubated. Orders placed and EDP at bedside along with CRNA at this time. RT, RN and charge nurse in room also. Patient's son on unit and aware circumstances.

## 2013-02-28 NOTE — Progress Notes (Signed)
Called Dr. Felecia Shelling and reported that OG tube is "below the diaphragm" per the xray report. MD says it is okay to resume TF through OG.

## 2013-02-28 NOTE — Progress Notes (Signed)
Leak test was performed on PT and her mouth was suctioned ou and her lungs before extubation. The PT is now wheezing throughout her lungs, RT gave her a breathing TX

## 2013-02-28 NOTE — Progress Notes (Signed)
atomidate give 1213. 1215 patient intubated.

## 2013-02-28 NOTE — Progress Notes (Signed)
NUTRITION FOLLOW UP  Intervention:   Continue Glucerna 1.2 @ 55 ml/hr. Regimen provides regimen will provide1584 kcal, 79 grams of protein, and 1063 ml of H2O from formula. Nutrition support will meet estimated 100% of energy goals and 87% of protein goals.  Nutrition Dx:   Inadequate oral intake related to inability to eat as evidenced by NPO status; ongoing  Goal:   Enteral nutrition to provide 60-70% of estimated calorie needs (22-25 kcals/kg ideal body weight) and 100% of estimated protein needs, based on ASPEN guidelines for permissive underfeeding in critically ill obese individuals; goal met  Monitor:   Nutrition support measures, labs, I/O's wt changes  Assessment:   Per RN, pt was extubated and reintubated today. RN reports that pt will resume previous TF of Glucerna 1.2 @ 55 ml/hr.   Patient is currently intubated on ventilator support.  Temp:Temp (24hrs), Avg:98.8 F (37.1 C), Min:97.9 F (36.6 C), Max:100 F (37.8 C)  Current wt of 389# reflects a 41# (9.5%) wt loss x 3 days, however, likely due to diuresis.   Height: Ht Readings from Last 1 Encounters:  02/27/13 5' (1.524 m)    Weight Status:   Wt Readings from Last 1 Encounters:  02/28/13 398 lb 2.4 oz (180.6 kg)    Re-estimated needs:  Kcal: 2258 kcals daily (per ASPEN underfeeding protocol, 1000-1136 kcals daily (22-25 kcals/kg IBW)) Protein: 180-226 grams daily (per ASPEN underfeeding protocol, 91-114 grams daily (2.0-2.5 g/kg IBW)) Fluid: per MD goals  Skin: Intact; noted dryness  Diet Order: NPO   Intake/Output Summary (Last 24 hours) at 02/28/13 1547 Last data filed at 02/28/13 0930  Gross per 24 hour  Intake 963.83 ml  Output   1850 ml  Net -886.17 ml    Last BM: 02/23/13   Labs:   Recent Labs Lab 02/26/13 0423 02/27/13 0455 02/28/13 0510  NA 142 143 143  K 3.2* 3.6 3.6  CL 99 100 97  CO2 34* 34* 36*  BUN 21 21 24*  CREATININE 1.52* 1.52* 1.68*  CALCIUM 9.3 9.3 9.6  GLUCOSE  150* 168* 196*    CBG (last 3)   Recent Labs  02/28/13 0715 02/28/13 1151 02/28/13 1303  GLUCAP 186* 230* 215*    Scheduled Meds: . albuterol  2.5 mg Nebulization Q4H  . antiseptic oral rinse  15 mL Mouth Rinse QID  . antiseptic oral rinse  15 mL Mouth Rinse QID  . carvedilol  25 mg Oral BID WC  . chlorhexidine  15 mL Mouth Rinse BID  . cloNIDine  0.2 mg Oral BID  . diltiazem  30 mg Oral Q6H  . ferrous sulfate  300 mg Per Tube BID WC  . furosemide  60 mg Intravenous TID  . hydrALAZINE  100 mg Oral Q6H  . insulin aspart  0-20 Units Subcutaneous Q4H  . methylPREDNISolone (SOLU-MEDROL) injection  125 mg Intravenous Q6H  . pantoprazole (PROTONIX) IV  40 mg Intravenous Daily  . potassium chloride  40 mEq Per Tube Daily  . Racepinephrine HCl        Continuous Infusions: . feeding supplement (GLUCERNA 1.2 CAL) Stopped (02/28/13 0930)    Meyer Dockery A. Mayford Knife, RD, LDN Pager: [phone]

## 2013-02-28 NOTE — Consults (Signed)
02/28/13  Called to assist in reintubation of [name] after failing extubation which occurred this morning.  Increased respiratory distress, labs noted.  Dr. Gwendolyn Grant present whennable to pass #7 ETT, so  CRNA arrived. He discussed plan with CRNA.  Amidate 10mg  IV, cricoid pressure applied and glidescope used by Dr. Gwendolyn Grant.  Unable to pass #7 ETT, #6 ETT passed over guide, +ETCO2, bil. Breath sounds equal, course.  Chest xray obtained to verify placement.

## 2013-02-28 NOTE — Progress Notes (Signed)
Subjective:  Patient was weaned and was extubated but started having difficulty to breath as soon as she is extubated, She is on BIPAP now    Objective: Vital signs in last 24 hours: Temp:  [98.4 F (36.9 C)-100 F (37.8 C)] 100 F (37.8 C) (10/11 0730) Pulse Rate:  [58-98] 68 (10/11 0900) Resp:  [23] 23 (10/11 0735) BP: (109-185)/(61-124) 145/76 mmHg (10/11 0900) SpO2:  [90 %-100 %] 91 % (10/11 0900) FiO2 (%):  [30 %-35 %] 30 % (10/11 0735) Weight:  [180.6 kg (398 lb 2.4 oz)] 180.6 kg (398 lb 2.4 oz) (10/11 0500) Weight change: -5 kg (-11 lb 0.4 oz) Last BM Date: 02/23/13  Intake/Output from previous day: 10/10 0701 - 10/11 0700 In: 1178.8 [NG/GT:1178.8] Out: 4250 [Urine:4250]  PHYSICAL EXAM General appearance: moderately obese and slowed mentation Resp: diminished breath sounds bilaterally and rhonchi bilaterally Cardio: S1 and S2 heard bradycardiac GI: soft, non-tender; bowel sounds normal; no masses,  no organomegaly Extremities: 2++ edema  Lab Results:    @labtest @ [facility]  Recent Labs  02/28/13 0829  PHART 7.415  PO2ART 73.3*  TCO2 33.7  HCO3 35.6*   CULTURES Recent Results (from the past 240 hour(s))  MRSA PCR SCREENING     Status: None   Collection Time    02/24/13  5:30 PM      Result Value Range Status   MRSA by PCR NEGATIVE  NEGATIVE Final   Comment:            The GeneXpert MRSA Assay (FDA     approved for NASAL specimens     only), is one component of a     comprehensive MRSA colonization     surveillance program. It is not     intended to diagnose MRSA     infection nor to guide or     monitor treatment for     MRSA infections.   Studies/Results: Dg Chest 1 View  02/27/2013   CLINICAL DATA:  Feeding tube placement  EXAM: CHEST - 1 VIEW  COMPARISON:  Portable exam 1621 hr compared to 0840 hr  FINDINGS: Tip of endotracheal tube projects 4.8 cm above chronic.  Nasogastric tube extends into stomach.  Left subclavian transvenous pacemaker leads  project over right atrium and right ventricle.  Enlargement of cardiac silhouette with pulmonary vascular congestion.  Perihilar infiltrates question edema/CHF.  No definite pleural effusion or pneumothorax.  IMPRESSION: Enlargement of cardiac silhouette with pulmonary vascular congestion and probable CHF.  Nasogastric tube extends into stomach.   Electronically Signed   By: Ulyses Southward M.D.   On: 02/27/2013 16:34   Dg Chest Port 1 View  02/28/2013   CLINICAL DATA:  Possible extubation today  EXAM: PORTABLE CHEST - 1 VIEW  COMPARISON:  And 14  FINDINGS: Endotracheal and NG tubes again identified. Significant cardiomegaly stable. Moderate vascular congestion with peribronchial cuffing and mild interstitial prominence, unchanged.  IMPRESSION: Findings of congestive heart failure stable when compared to prior study.   Electronically Signed   By: Esperanza Heir M.D.   On: 02/28/2013 08:17   Dg Chest Port 1 View  02/27/2013   CLINICAL DATA:  CHF and intubation.  EXAM: PORTABLE CHEST - 1 VIEW  COMPARISON:  02/25/2013  FINDINGS: Endotracheal tube is 4.2 cm above the carina. The nasogastric tube is in the upper stomach region. There are diffuse interstitial densities suggestive for edema that have minimally changed. Patient has a cardiac pacemaker. The heart size appears to be upper  limits of normal and minimally changed. No evidence for a pneumothorax.  IMPRESSION: Minimal change in the cardiomegaly with interstitial lung densities. Findings are suggestive for pulmonary edema and CHF.  Support apparatuses as described.   Electronically Signed   By: Richarda Overlie M.D.   On: 02/27/2013 08:53    Medications: I have reviewed the patient's current medications.  Assesment: Ventilatory dependent respiratory failure Principal Problem:   Acute on chronic diastolic heart failure Active Problems:   DM (diabetes mellitus), type 2, uncontrolled with complications   HYPERLIPIDEMIA   OBESITY, MORBID   Anemia, iron  deficiency   SLEEP APNEA, OBSTRUCTIVE   HYPERTENSION   CORONARY ARTERY DISEASE   GERD   PACEMAKER, PERMANENT   Obesity hypoventilation syndrome   SVT (supraventricular tachycardia)   Acute respiratory failure    Plan: Medications reviewed continue BIPAP Will monitor ABG Will start on IV solumedrol 125 mg q 6 hrs Ativan 1 mg iv q 6 hrs C.    LOS: 4 days   Yuriy Cui 02/28/2013, 10:13 AM

## 2013-02-28 NOTE — Progress Notes (Signed)
Pt weaned 5/5 30% 0710-0930. VT,RR,BP, stable. ABG compensated. Pt was writing notes and trying to talk while on vent. Following directions. NIF -37, FVC .8L.Marland Kitchen Pt did  talk after extubation. The PT said she couldn't breath and needed  to be pulled up in the bed, so the nurse and I used the lift to pull her up. The PT's anxiety was very high. RT didnt like the way the vent bipap mask fit on her face and changed her to the V60 18/10, 100%, and RT then decreased her o2 to 70. Dr. Felecia Shelling came in to look at the PT and said to get a ABG after she had been on bipap.

## 2013-02-28 NOTE — Procedures (Signed)
Extubation Procedure Note  Patient Details:   Name: DERRY ARBOGAST DOB: 07/14/1955 MRN: [id number]   Airway Documentation:  Airway 7.5 mm (Active)  Secured at (cm) 21 cm 02/28/2013  7:35 AM  Measured From Lips 02/28/2013  7:35 AM  Secured Location Left 02/28/2013  7:35 AM  Secured By [employer] 02/28/2013  7:35 AM  Tube Holder Repositioned Yes 02/28/2013  7:35 AM  Cuff Pressure (cm H2O) 26 cm H2O 02/28/2013  7:35 AM  Site Condition Dry 02/28/2013  7:35 AM    Evaluation  O2 sats: transiently fell during during procedure Complications: Complications of after extubation pt stated she could not breath rt placed pt on bipap  Patient did not tolerate procedure well. Bilateral Breath Sounds: Diminished Suctioning: Oral;Airway Yes  [name] 02/28/2013, 10:19 AM

## 2013-03-01 LAB — CBC
HCT: 27.6 % — ABNORMAL LOW (ref 36.0–46.0)
MCH: 28.6 pg (ref 26.0–34.0)
MCHC: 31.9 g/dL (ref 30.0–36.0)
Platelets: 308 10*3/uL (ref 150–400)
RDW: 16.4 % — ABNORMAL HIGH (ref 11.5–15.5)
WBC: 14 10*3/uL — ABNORMAL HIGH (ref 4.0–10.5)

## 2013-03-01 LAB — COMPREHENSIVE METABOLIC PANEL
ALT: 20 U/L (ref 0–35)
AST: 18 U/L (ref 0–37)
Albumin: 3.2 g/dL — ABNORMAL LOW (ref 3.5–5.2)
Alkaline Phosphatase: 87 U/L (ref 39–117)
BUN: 31 mg/dL — ABNORMAL HIGH (ref 6–23)
Calcium: 9.6 mg/dL (ref 8.4–10.5)
Chloride: 95 mEq/L — ABNORMAL LOW (ref 96–112)
Potassium: 3.6 mEq/L (ref 3.5–5.1)
Sodium: 143 mEq/L (ref 135–145)
Total Protein: 7.8 g/dL (ref 6.0–8.3)

## 2013-03-01 LAB — GLUCOSE, CAPILLARY
Glucose-Capillary: 231 mg/dL — ABNORMAL HIGH (ref 70–99)
Glucose-Capillary: 315 mg/dL — ABNORMAL HIGH (ref 70–99)
Glucose-Capillary: 307 mg/dL — ABNORMAL HIGH (ref 70–99)

## 2013-03-01 MED ORDER — SODIUM CHLORIDE 0.9 % IV SOLN
INTRAVENOUS | Status: DC
  Administered 2013-03-01 – 2013-03-09 (×3): via INTRAVENOUS

## 2013-03-01 MED ORDER — NYSTATIN 100000 UNIT/GM EX POWD
Freq: Two times a day (BID) | CUTANEOUS | Status: DC
  Administered 2013-03-01: 17:00:00 via TOPICAL
  Administered 2013-03-01: 1 via TOPICAL
  Administered 2013-03-02 – 2013-03-16 (×25): via TOPICAL
  Administered 2013-03-17: 1 g via TOPICAL
  Administered 2013-03-17 – 2013-03-27 (×20): via TOPICAL
  Filled 2013-03-01 (×4): qty 15

## 2013-03-01 NOTE — Progress Notes (Signed)
Subjective:  Patient was weaning and was extubated but later developed respiratory distress and was re intubated. Patient is alert awake and responding to verbal communications...   Objective: Vital signs in last 24 hours: Temp:  [98.3 F (36.8 C)-99.5 F (37.5 C)] 98.8 F (37.1 C) (10/12 1230) Pulse Rate:  [55-85] 62 (10/12 1100) BP: (97-204)/(72-130) 183/92 mmHg (10/12 1200) SpO2:  [89 %-100 %] 99 % (10/12 1100) FiO2 (%):  [35 %-50 %] 35 % (10/12 1212) Weight:  [179.4 kg (395 lb 8.1 oz)] 179.4 kg (395 lb 8.1 oz) (10/12 0500) Weight change: -1.2 kg (-2 lb 10.3 oz) Last BM Date: 02/23/13  Intake/Output from previous day: 10/11 0701 - 10/12 0700 In: 1014.8 [NG/GT:1014.8] Out: 4950 [Urine:4950]  PHYSICAL EXAM General appearance: moderately obese and slowed mentation Resp: diminished breath sounds bilaterally and rhonchi bilaterally Cardio: S1 and S2 heard bradycardiac GI: soft, non-tender; bowel sounds normal; no masses,  no organomegaly Extremities: 2++ edema  Lab Results:    @labtest @ [facility]  Recent Labs  03/01/13 0556  PHART 7.435  PO2ART 98.2  TCO2 33.6  HCO3 36.0*   CULTURES Recent Results (from the past 240 hour(s))  MRSA PCR SCREENING     Status: None   Collection Time    02/24/13  5:30 PM      Result Value Range Status   MRSA by PCR NEGATIVE  NEGATIVE Final   Comment:            The GeneXpert MRSA Assay (FDA     approved for NASAL specimens     only), is one component of a     comprehensive MRSA colonization     surveillance program. It is not     intended to diagnose MRSA     infection nor to guide or     monitor treatment for     MRSA infections.   Studies/Results: Dg Chest 1 View  02/27/2013   CLINICAL DATA:  Feeding tube placement  EXAM: CHEST - 1 VIEW  COMPARISON:  Portable exam 1621 hr compared to 0840 hr  FINDINGS: Tip of endotracheal tube projects 4.8 cm above chronic.  Nasogastric tube extends into stomach.  Left subclavian transvenous  pacemaker leads project over right atrium and right ventricle.  Enlargement of cardiac silhouette with pulmonary vascular congestion.  Perihilar infiltrates question edema/CHF.  No definite pleural effusion or pneumothorax.  IMPRESSION: Enlargement of cardiac silhouette with pulmonary vascular congestion and probable CHF.  Nasogastric tube extends into stomach.   Electronically Signed   By: Ulyses Southward M.D.   On: 02/27/2013 16:34   Portable Chest Xray  02/28/2013   CLINICAL DATA:  Re-intubation. Evaluate ET tube placement.  EXAM: PORTABLE CHEST - 1 VIEW  COMPARISON:  02/28/2013 at 7:55 a.m.  FINDINGS: Endotracheal tube tip lies 3 cm above the carina.  Enteric tube passes below the diaphragm. Left anterior chest wall pacemaker is stable well positioned.  Persistent vascular congestion and lower lung zone and perihilar patchy airspace opacity is noted consistent with pulmonary edema.  IMPRESSION: Endotracheal tube well positioned.  Persistent pulmonary edema.   Electronically Signed   By: Amie Portland M.D.   On: 02/28/2013 12:47   Dg Chest Port 1 View  02/28/2013   CLINICAL DATA:  Possible extubation today  EXAM: PORTABLE CHEST - 1 VIEW  COMPARISON:  And 14  FINDINGS: Endotracheal and NG tubes again identified. Significant cardiomegaly stable. Moderate vascular congestion with peribronchial cuffing and mild interstitial prominence, unchanged.  IMPRESSION: Findings  of congestive heart failure stable when compared to prior study.   Electronically Signed   By: Esperanza Heir M.D.   On: 02/28/2013 08:17    Medications: I have reviewed the patient's current medications.  Assesment: Ventilatory dependent respiratory failure Principal Problem:   Acute on chronic diastolic heart failure Active Problems:   DM (diabetes mellitus), type 2, uncontrolled with complications   HYPERLIPIDEMIA   OBESITY, MORBID   Anemia, iron deficiency   SLEEP APNEA, OBSTRUCTIVE   HYPERTENSION   CORONARY ARTERY DISEASE    GERD   PACEMAKER, PERMANENT   Obesity hypoventilation syndrome   SVT (supraventricular tachycardia)   Acute respiratory failure    Plan: Medications reviewed Continue ventilatory support Continue iv steroid Cbc/BMP and ABG    LOS: 5 days   Geneviene Tesch 03/01/2013, 12:56 PM

## 2013-03-01 NOTE — Progress Notes (Signed)
Before the PT is extubated again RT needs to discuss with the doctor about the PT;s throat swelling. Percautions need to be made before extubation.

## 2013-03-02 LAB — CBC
HCT: 29.9 % — ABNORMAL LOW (ref 36.0–46.0)
MCH: 27.8 pg (ref 26.0–34.0)
MCHC: 31.1 g/dL (ref 30.0–36.0)
MCV: 89.5 fL (ref 78.0–100.0)
Platelets: 352 10*3/uL (ref 150–400)
RBC: 3.34 MIL/uL — ABNORMAL LOW (ref 3.87–5.11)
RDW: 17.1 % — ABNORMAL HIGH (ref 11.5–15.5)
WBC: 10.3 10*3/uL (ref 4.0–10.5)

## 2013-03-02 LAB — GLUCOSE, CAPILLARY
Glucose-Capillary: 269 mg/dL — ABNORMAL HIGH (ref 70–99)
Glucose-Capillary: 313 mg/dL — ABNORMAL HIGH (ref 70–99)
Glucose-Capillary: 313 mg/dL — ABNORMAL HIGH (ref 70–99)
Glucose-Capillary: 314 mg/dL — ABNORMAL HIGH (ref 70–99)

## 2013-03-02 LAB — BASIC METABOLIC PANEL
CO2: 36 mEq/L — ABNORMAL HIGH (ref 19–32)
Chloride: 96 mEq/L (ref 96–112)
Creatinine, Ser: 1.75 mg/dL — ABNORMAL HIGH (ref 0.50–1.10)
GFR calc Af Amer: 36 mL/min — ABNORMAL LOW (ref >90)
GFR calc non Af Amer: 31 mL/min — ABNORMAL LOW (ref >90)
Potassium: 3.7 mEq/L (ref 3.5–5.1)

## 2013-03-02 LAB — BLOOD GAS, ARTERIAL
Acid-Base Excess: 10.7 mmol/L — ABNORMAL HIGH (ref 0.0–2.0)
FIO2: 0.35 %
O2 Saturation: 98.1 %
PEEP: 5 cmH2O
TCO2: 32.5 mmol/L (ref 0–100)
pCO2 arterial: 55 mmHg — ABNORMAL HIGH (ref 35.0–45.0)
pH, Arterial: 7.426 (ref 7.350–7.450)
pO2, Arterial: 118 mmHg — ABNORMAL HIGH (ref 80.0–100.0)

## 2013-03-02 NOTE — Progress Notes (Signed)
Subjective: She indicates that she feels okay. She was able to be extubated but then almost immediately had to be reintubated within several hours. She still has a lot of secretions so I don't think were going to be able to do anything with extubation today  Objective: Vital signs in last 24 hours: Temp:  [98 F (36.7 C)-99.1 F (37.3 C)] 98 F (36.7 C) (10/13 0734) Pulse Rate:  [41-71] 66 (10/13 0807) Resp:  [16] 16 (10/13 0710) BP: (108-186)/(30-133) 173/81 mmHg (10/13 0807) SpO2:  [88 %-100 %] 100 % (10/13 0710) FiO2 (%):  [35 %] 35 % (10/13 0800) Weight:  [173.6 kg (382 lb 11.5 oz)] 173.6 kg (382 lb 11.5 oz) (10/13 0500) Weight change: -5.8 kg (-12 lb 12.6 oz) Last BM Date: 02/23/13  Intake/Output from previous day: 10/12 0701 - 10/13 0700 In: 1955.3 [I.V.:175.3; NG/GT:1780] Out: 2240 [Urine:2240]  PHYSICAL EXAM General appearance: alert, cooperative, mild distress, morbidly obese and Intubated and undergoing wakeup assessment. She is awake and alert. Resp: diminished breath sounds bilaterally Cardio: regular rate and rhythm, S1, S2 normal, no murmur, click, rub or gallop GI: soft, non-tender; bowel sounds normal; no masses,  no organomegaly Extremities: Chronic venous stasis and lymphedema  Lab Results:    Basic Metabolic Panel:  Recent Labs  16/10/96 0522 03/02/13 0516  NA 143 146*  K 3.6 3.7  CL 95* 96  CO2 36* 36*  GLUCOSE 299* 343*  BUN 31* 42*  CREATININE 1.66* 1.75*  CALCIUM 9.6 9.5   Liver Function Tests:  Recent Labs  03/01/13 0522  AST 18  ALT 20  ALKPHOS 87  BILITOT 0.2*  PROT 7.8  ALBUMIN 3.2*   No results found for this basename: LIPASE, AMYLASE,  in the last 72 hours No results found for this basename: AMMONIA,  in the last 72 hours CBC:  Recent Labs  03/01/13 0522 03/02/13 0516  WBC 14.0* 10.3  HGB 8.8* 9.3*  HCT 27.6* 29.9*  MCV 89.6 89.5  PLT 308 352   Cardiac Enzymes: No results found for this basename: CKTOTAL, CKMB,  CKMBINDEX, TROPONINI,  in the last 72 hours BNP: No results found for this basename: PROBNP,  in the last 72 hours D-Dimer: No results found for this basename: DDIMER,  in the last 72 hours CBG:  Recent Labs  03/01/13 1109 03/01/13 1552 03/01/13 1948 03/01/13 2356 03/02/13 0446 03/02/13 0744  GLUCAP 283* 315* 307* 313* 281* 336*   Hemoglobin A1C: No results found for this basename: HGBA1C,  in the last 72 hours Fasting Lipid Panel: No results found for this basename: CHOL, HDL, LDLCALC, TRIG, CHOLHDL, LDLDIRECT,  in the last 72 hours Thyroid Function Tests: No results found for this basename: TSH, T4TOTAL, FREET4, T3FREE, THYROIDAB,  in the last 72 hours Anemia Panel: No results found for this basename: VITAMINB12, FOLATE, FERRITIN, TIBC, IRON, RETICCTPCT,  in the last 72 hours Coagulation: No results found for this basename: LABPROT, INR,  in the last 72 hours Urine Drug Screen: Drugs of Abuse     Component Value Date/Time   LABOPIA NEGATIVE 02/25/2009 1755   LABOPIA NONE DETECTED 06/05/2007 1610   COCAINSCRNUR NEGATIVE 02/25/2009 1755   COCAINSCRNUR NONE DETECTED 06/05/2007 1610   LABBENZ NEGATIVE 02/25/2009 1755   LABBENZ NONE DETECTED 06/05/2007 1610   AMPHETMU NEGATIVE 02/25/2009 1755   AMPHETMU NONE DETECTED 06/05/2007 1610   THCU NONE DETECTED 06/05/2007 1610   LABBARB  Value: NONE DETECTED        DRUG SCREEN  FOR MEDICAL PURPOSES ONLY.  IF CONFIRMATION IS NEEDED FOR ANY PURPOSE, NOTIFY LAB WITHIN 5 DAYS. 06/05/2007 1610    Alcohol Level: No results found for this basename: ETH,  in the last 72 hours Urinalysis: No results found for this basename: COLORURINE, APPERANCEUR, LABSPEC, PHURINE, GLUCOSEU, HGBUR, BILIRUBINUR, KETONESUR, PROTEINUR, UROBILINOGEN, NITRITE, LEUKOCYTESUR,  in the last 72 hours Misc. Labs:  [facility]  Recent Labs  03/02/13 0630  PHART 7.426  PO2ART 118.0*  TCO2 32.5  HCO3 35.5*   CULTURES Recent Results (from the past 240 hour(s))  MRSA PCR  SCREENING     Status: None   Collection Time    02/24/13  5:30 PM      Result Value Range Status   MRSA by PCR NEGATIVE  NEGATIVE Final   Comment:            The GeneXpert MRSA Assay (FDA     approved for NASAL specimens     only), is one component of a     comprehensive MRSA colonization     surveillance program. It is not     intended to diagnose MRSA     infection nor to guide or     monitor treatment for     MRSA infections.   Studies/Results: Portable Chest Xray  02/28/2013   CLINICAL DATA:  Re-intubation. Evaluate ET tube placement.  EXAM: PORTABLE CHEST - 1 VIEW  COMPARISON:  02/28/2013 at 7:55 a.m.  FINDINGS: Endotracheal tube tip lies 3 cm above the carina.  Enteric tube passes below the diaphragm. Left anterior chest wall pacemaker is stable well positioned.  Persistent vascular congestion and lower lung zone and perihilar patchy airspace opacity is noted consistent with pulmonary edema.  IMPRESSION: Endotracheal tube well positioned.  Persistent pulmonary edema.   Electronically Signed   By: Amie Portland M.D.   On: 02/28/2013 12:47    Medications:  Prior to Admission:  Prescriptions prior to admission  Medication Sig Dispense Refill  . albuterol (PROVENTIL) (2.5 MG/3ML) 0.083% nebulizer solution Take 2.5 mg by nebulization every 6 (six) hours as needed. For shortness of breath      . allopurinol (ZYLOPRIM) 300 MG tablet Take 300 mg by mouth daily.      Marland Kitchen ALPRAZolam (XANAX) 0.5 MG tablet Take 0.5 mg by mouth 2 (two) times daily.      . carvedilol (COREG) 25 MG tablet Take 25 mg by mouth 2 (two) times daily.      . cloNIDine (CATAPRES) 0.2 MG tablet Take 0.2 mg by mouth 2 (two) times daily.      Marland Kitchen diltiazem (CARDIZEM) 30 MG tablet Take 1 tablet (30 mg total) by mouth every 6 (six) hours.  120 tablet  12  . ferrous sulfate 325 (65 FE) MG tablet Take 325 mg by mouth daily.        . hydrALAZINE (APRESOLINE) 100 MG tablet Take 100 mg by mouth 4 (four) times daily.      .  insulin lispro protamine-insulin lispro (HUMALOG 75/25) (75-25) 100 UNIT/ML SUSP Inject 20 Units into the skin 3 (three) times daily.       . Multiple Vitamins-Minerals (MULTIVITAMINS THER. W/MINERALS) TABS Take 1 tablet by mouth daily.        Marland Kitchen neomycin-bacitracin-polymyxin (NEOSPORIN) [phone] ointment APPLY TO YOUR LEGS ON EVERY Tuesday, Thursday, AND Saturday.  28.3 g  0  . nystatin (MYCOSTATIN) cream APPLY TO YOUR SKIN FOLDS AND LEGS EVERY Monday, Wednesday , AND Friday.  30 g  1  . spironolactone (ALDACTONE) 25 MG tablet Take 25 mg by mouth daily.      Marland Kitchen torsemide (DEMADEX) 100 MG tablet Take 50 mg by mouth 2 (two) times daily.      . vitamin C (ASCORBIC ACID) 500 MG tablet Take 500 mg by mouth daily.       Scheduled: . albuterol  2.5 mg Nebulization Q4H  . antiseptic oral rinse  15 mL Mouth Rinse QID  . carvedilol  25 mg Oral BID WC  . chlorhexidine  15 mL Mouth Rinse BID  . cloNIDine  0.2 mg Oral BID  . diltiazem  30 mg Oral Q6H  . ferrous sulfate  300 mg Per Tube BID WC  . furosemide  60 mg Intravenous TID  . hydrALAZINE  100 mg Oral Q6H  . insulin aspart  0-20 Units Subcutaneous Q4H  . methylPREDNISolone (SOLU-MEDROL) injection  125 mg Intravenous Q6H  . nystatin   Topical BID  . pantoprazole (PROTONIX) IV  40 mg Intravenous Daily  . potassium chloride  40 mEq Per Tube Daily   Continuous: . sodium chloride 10 mL/hr at 03/02/13 0600  . feeding supplement (GLUCERNA 1.2 CAL) 1,000 mL (03/02/13 0600)   WUJ:[id number], acetaminophen, fentaNYL, midazolam, ondansetron (ZOFRAN) IV, ondansetron, promethazine  Assesment: She has acute on chronic respiratory failure. She has some problem with diastolic heart failure as well. She was able to be extubated but then had to be reintubated almost immediately. She now has a very small endotracheal tube and a lot of secretions or don't think it's in her best interest to try to get her off the ventilator today. She has multiple other medical  problems including history of supraventricular tachycardia obesity hypoventilation coronary artery occlusive disease and sleep apnea Principal Problem:   Acute on chronic diastolic heart failure Active Problems:   DM (diabetes mellitus), type 2, uncontrolled with complications   HYPERLIPIDEMIA   OBESITY, MORBID   Anemia, iron deficiency   SLEEP APNEA, OBSTRUCTIVE   HYPERTENSION   CORONARY ARTERY DISEASE   GERD   PACEMAKER, PERMANENT   Obesity hypoventilation syndrome   SVT (supraventricular tachycardia)   Acute respiratory failure    Plan: Continue current treatments    LOS: 6 days   Neng Albee L 03/02/2013, 8:27 AM

## 2013-03-02 NOTE — Progress Notes (Signed)
Patient has weaned 9.5 hours today on cpap 5/5 and has tolerated it very well.  Patient has had large amounts of yellow secretions, requiring to be suctioned every 2-3 hours.  Patient placed now back on rest mode.

## 2013-03-02 NOTE — Progress Notes (Signed)
UR chart review completed.  

## 2013-03-02 NOTE — Progress Notes (Signed)
Primary cardiologist: Dr. Dina Rich  Subjective:   Patient awake, appears comfortable, on ventilator.   Objective:   Temp:  [98 F (36.7 C)-99.1 F (37.3 C)] 98 F (36.7 C) (10/13 0734) Pulse Rate:  [41-71] 66 (10/13 0807) Resp:  [16] 16 (10/13 0800) BP: (108-186)/(30-133) 173/81 mmHg (10/13 0807) SpO2:  [88 %-100 %] 100 % (10/13 0710) FiO2 (%):  [35 %] 35 % (10/13 0800) Weight:  [382 lb 11.5 oz (173.6 kg)] 382 lb 11.5 oz (173.6 kg) (10/13 0500) Last BM Date: 02/23/13  Filed Weights   02/28/13 0500 03/01/13 0500 03/02/13 0500  Weight: 398 lb 2.4 oz (180.6 kg) 395 lb 8.1 oz (179.4 kg) 382 lb 11.5 oz (173.6 kg)    Intake/Output Summary (Last 24 hours) at 03/02/13 0930 Last data filed at 03/02/13 0825  Gross per 24 hour  Intake 1635.33 ml  Output   2665 ml  Net -1029.67 ml    Telemetry:  Currently paced rhythm.  Exam:  General: Morbidly obese, no acute distress.  Lungs: Diminished but relatively clear breath sounds.  Cardiac: RRR, no gallop.  Extremities: No pitting.  Lab Results:  Basic Metabolic Panel:  Recent Labs Lab 02/28/13 0510 03/01/13 0522 03/02/13 0516  NA 143 143 146*  K 3.6 3.6 3.7  CL 97 95* 96  CO2 36* 36* 36*  GLUCOSE 196* 299* 343*  BUN 24* 31* 42*  CREATININE 1.68* 1.66* 1.75*  CALCIUM 9.6 9.6 9.5    Liver Function Tests:  Recent Labs Lab 02/26/13 0423 03/01/13 0522  AST 17 18  ALT 17 20  ALKPHOS 74 87  BILITOT 0.3 0.2*  PROT 6.5 7.8  ALBUMIN 2.9* 3.2*    CBC:  Recent Labs Lab 02/27/13 0455 03/01/13 0522 03/02/13 0516  WBC 11.6* 14.0* 10.3  HGB 8.7* 8.8* 9.3*  HCT 27.8* 27.6* 29.9*  MCV 90.0 89.6 89.5  PLT 265 308 352    Cardiac Enzymes:  Recent Labs Lab 02/24/13 1209 02/24/13 1831 02/24/13 2256  TROPONINI <0.30 <0.30 <0.30     Medications:   Scheduled Medications: . albuterol  2.5 mg Nebulization Q4H  . antiseptic oral rinse  15 mL Mouth Rinse QID  . carvedilol  25 mg Oral BID WC  .  chlorhexidine  15 mL Mouth Rinse BID  . cloNIDine  0.2 mg Oral BID  . diltiazem  30 mg Oral Q6H  . ferrous sulfate  300 mg Per Tube BID WC  . furosemide  60 mg Intravenous TID  . hydrALAZINE  100 mg Oral Q6H  . insulin aspart  0-20 Units Subcutaneous Q4H  . methylPREDNISolone (SOLU-MEDROL) injection  125 mg Intravenous Q6H  . nystatin   Topical BID  . pantoprazole (PROTONIX) IV  40 mg Intravenous Daily  . potassium chloride  40 mEq Per Tube Daily     Infusions: . sodium chloride 10 mL/hr at 03/02/13 0600  . feeding supplement (GLUCERNA 1.2 CAL) 1,000 mL (03/02/13 0600)     PRN Medications:  acetaminophen, acetaminophen, fentaNYL, midazolam, ondansetron (ZOFRAN) IV, ondansetron, promethazine   Assessment:   1. PSVT, continue Coreg and diltiazem.  2. Acute on chronic diastolic heart failure. Patient on intravenous Lasix with potassium supplementation. Continues to diurese, weight is also decreasing (although question accuracy of all numbers).  3. VDRF, managed by Dr. Juanetta Gosling. Patient had to be recently reintubated due to increased secretions.    Plan/Discussion:    Continue Coreg, diltiazem, and hydralazine. Creatinine is beginning to bump up some, may  need to cut back on IV diuretic dose as of tomorrow. Would like to try and improve diastolic heart failure as much as possible prior to next extubation attempt.   Jonelle Sidle, M.D., F.A.C.C.

## 2013-03-02 NOTE — Progress Notes (Signed)
Inpatient Diabetes Program Recommendations  AACE/ADA: New Consensus Statement on Inpatient Glycemic Control (2013)  Target Ranges:  Prepandial:   less than 140 mg/dL      Peak postprandial:   less than 180 mg/dL (1-2 hours)      Critically ill patients:  140 - 180 mg/dL    Results for RENAI, LOPATA (MRN [id number]) as of 03/02/2013 08:17  Ref. Range 03/01/2013 04:06 03/01/2013 07:16 03/01/2013 11:09 03/01/2013 15:52 03/01/2013 19:48 03/01/2013 23:56 03/02/2013 04:46 03/02/2013 07:44  Glucose-Capillary Latest Range: 70-99 mg/dL 409 (H) 811 (H) 914 (H) 315 (H) 307 (H) 313 (H) 281 (H) 336 (H)   Inpatient Diabetes Program Recommendations Insulin - Basal: Please order Lantus 40 units daily. Insulin - Meal Coverage: Please order Novolog 3 units Q4H for tube feeding coverage.   Note: Patient has a history of diabetes and takes Novolog 75/25 20 units TID as an outpatient for diabetes management.  Currently, patient is ordered to receive Novolog 0-20 units Q4H for inpatient glycemic control.  Patient is also ordered Solumedrol 125 mg Q6H which is contributing to hyperglycemia.  According to the chart, patient was extubated yesterday but ended up having to be reintubated.  Please order Lantus 40 units daily and Novolog 3 units Q4H for tube feeding coverage.  Will continue to follow.  Thanks, Orlando Penner, RN, MSN, CCRN Diabetes Coordinator Inpatient Diabetes Program [phone] (Team Pager) [phone] ([facility] office) [phone] [facility] office)

## 2013-03-03 ENCOUNTER — Inpatient Hospital Stay (HOSPITAL_COMMUNITY): Payer: Medicaid Other

## 2013-03-03 DIAGNOSIS — N179 Acute kidney failure, unspecified: Secondary | ICD-10-CM

## 2013-03-03 DIAGNOSIS — J9600 Acute respiratory failure, unspecified whether with hypoxia or hypercapnia: Secondary | ICD-10-CM

## 2013-03-03 LAB — URINALYSIS, ROUTINE W REFLEX MICROSCOPIC
Ketones, ur: NEGATIVE mg/dL
Leukocytes, UA: NEGATIVE
Nitrite: NEGATIVE
Protein, ur: >300 mg/dL — AB
Specific Gravity, Urine: 1.014 (ref 1.005–1.030)
Urobilinogen, UA: 0.2 mg/dL (ref 0.0–1.0)

## 2013-03-03 LAB — BASIC METABOLIC PANEL
BUN: 53 mg/dL — ABNORMAL HIGH (ref 6–23)
Chloride: 94 mEq/L — ABNORMAL LOW (ref 96–112)
GFR calc Af Amer: 39 mL/min — ABNORMAL LOW (ref >90)
GFR calc non Af Amer: 33 mL/min — ABNORMAL LOW (ref >90)
Glucose, Bld: 340 mg/dL — ABNORMAL HIGH (ref 70–99)
Potassium: 3.6 mEq/L (ref 3.5–5.1)
Sodium: 142 mEq/L (ref 135–145)

## 2013-03-03 LAB — CBC WITH DIFFERENTIAL/PLATELET
Basophils Absolute: 0 10*3/uL (ref 0.0–0.1)
Basophils Relative: 0 % (ref 0–1)
Eosinophils Absolute: 0 10*3/uL (ref 0.0–0.7)
Hemoglobin: 9.8 g/dL — ABNORMAL LOW (ref 12.0–15.0)
Lymphocytes Relative: 6 % — ABNORMAL LOW (ref 12–46)
Lymphs Abs: 0.6 10*3/uL — ABNORMAL LOW (ref 0.7–4.0)
MCH: 28.4 pg (ref 26.0–34.0)
Monocytes Relative: 6 % (ref 3–12)
Neutro Abs: 8.9 10*3/uL — ABNORMAL HIGH (ref 1.7–7.7)
Neutrophils Relative %: 89 % — ABNORMAL HIGH (ref 43–77)
Platelets: 361 10*3/uL (ref 150–400)
RBC: 3.45 MIL/uL — ABNORMAL LOW (ref 3.87–5.11)
WBC: 10.1 10*3/uL (ref 4.0–10.5)

## 2013-03-03 LAB — GLUCOSE, CAPILLARY
Glucose-Capillary: 321 mg/dL — ABNORMAL HIGH (ref 70–99)
Glucose-Capillary: 290 mg/dL — ABNORMAL HIGH (ref 70–99)
Glucose-Capillary: 277 mg/dL — ABNORMAL HIGH (ref 70–99)

## 2013-03-03 LAB — URINE MICROSCOPIC-ADD ON

## 2013-03-03 LAB — BLOOD GAS, ARTERIAL
Bicarbonate: 35.7 mEq/L — ABNORMAL HIGH (ref 20.0–24.0)
Drawn by: 317771
PEEP: 5 cmH2O
Patient temperature: 37
RATE: 16 resp/min
pCO2 arterial: 59.6 mmHg (ref 35.0–45.0)
pH, Arterial: 7.394 (ref 7.350–7.450)
pO2, Arterial: 119 mmHg — ABNORMAL HIGH (ref 80.0–100.0)

## 2013-03-03 LAB — MRSA PCR SCREENING: MRSA by PCR: NEGATIVE

## 2013-03-03 MED ORDER — FAMOTIDINE IN NACL 20-0.9 MG/50ML-% IV SOLN
20.0000 mg | INTRAVENOUS | Status: DC
  Administered 2013-03-03: 20 mg via INTRAVENOUS
  Filled 2013-03-03 (×2): qty 50

## 2013-03-03 MED ORDER — INSULIN ASPART 100 UNIT/ML ~~LOC~~ SOLN
3.0000 [IU] | SUBCUTANEOUS | Status: DC
  Administered 2013-03-03 – 2013-03-04 (×5): 3 [IU] via SUBCUTANEOUS

## 2013-03-03 MED ORDER — INSULIN GLARGINE 100 UNIT/ML ~~LOC~~ SOLN
40.0000 [IU] | Freq: Every day | SUBCUTANEOUS | Status: DC
  Filled 2013-03-03 (×2): qty 0.4

## 2013-03-03 MED ORDER — FENTANYL CITRATE 0.05 MG/ML IJ SOLN
25.0000 ug | INTRAMUSCULAR | Status: DC | PRN
  Administered 2013-03-03 – 2013-03-04 (×2): 100 ug via INTRAVENOUS
  Administered 2013-03-04 (×2): 50 ug via INTRAVENOUS
  Filled 2013-03-03 (×4): qty 2

## 2013-03-03 MED ORDER — CLONIDINE HCL 0.3 MG PO TABS
0.3000 mg | ORAL_TABLET | Freq: Three times a day (TID) | ORAL | Status: DC
  Administered 2013-03-03 – 2013-03-27 (×72): 0.3 mg via ORAL
  Filled 2013-03-03 (×79): qty 1

## 2013-03-03 NOTE — Consult Note (Signed)
Reason for Consult: Respiratory failure, ventilator dependence Referring Physician: Kevin Fenton, MD  HPI:  [name] is an 57 y.o. female who was transferred from [facility] earlier today for further evaluation and management of her chronic respiratory failure and ventilator dependence. The patient has a history of multiple medical problems, including morbid obesity, obstructive sleep apnea, diastolic heart failure, oxygen dependent COPD, obesity hypoventilation syndrome, and supraventricular tachycardia.  She was admitted to the [facility] on the seventh of this month. She was intubated due to her respiratory failure. Attempts to wean her off the ventilator was unsuccessful. She was extubated, but was reintubated. As a result, ENT is consulted for possible tracheostomy tube placement. The patient has a dual-chamber pacemaker.   Past Medical History  Diagnosis Date  . Morbid obesity   . Essential hypertension, benign   . Chronic diastolic heart failure     LVEF 60-65% 2011  . COPD (chronic obstructive pulmonary disease)     Oxygen dependent  . History of cardiac catheterization     Normal coronary arteries 2008  . Obesity hypoventilation syndrome   . Arthritis   . Hiatal hernia   . Symptomatic bradycardia     With pauses 2008 - Followed by Dr. Graciela Husbands  . Cellulitis     Recurrent bilateral LE  . Mixed hyperlipidemia   . Peripheral vascular disease   . Myocardial infarction 1990's  . Sleep apnea   . Type II diabetes mellitus   . Gout   . Nonsustained ventricular tachycardia     Documented 2008  . SVT (supraventricular tachycardia)     Reported possible atrial arrhythmias    Past Surgical History  Procedure Laterality Date  . Abdominal hysterectomy  1980's  . Insert / replace / remove pacemaker  2010    Family History  Problem Relation Age of Onset  . Coronary artery disease Mother     MI age 36, died.  . Hypertension Father   . Osteoarthritis  Father   . Hypertension Sister   . Hypertension Brother   . Obesity Father     Social History:  reports that she quit smoking about 45 years ago. Her smoking use included Cigarettes. She has a .05 pack-year smoking history. She has never used smokeless tobacco. She reports that she does not drink alcohol or use illicit drugs.  Allergies:  Allergies  Allergen Reactions  . Morphine And Related Hives    Medications:  I have reviewed the patient's current medications. Scheduled: . albuterol  2.5 mg Nebulization Q4H  . antiseptic oral rinse  15 mL Mouth Rinse QID  . carvedilol  25 mg Oral BID WC  . chlorhexidine  15 mL Mouth Rinse BID  . cloNIDine  0.3 mg Oral TID  . diltiazem  30 mg Oral Q6H  . famotidine (PEPCID) IV  20 mg Intravenous Q24H  . ferrous sulfate  300 mg Per Tube BID WC  . hydrALAZINE  100 mg Oral Q6H  . insulin aspart  0-20 Units Subcutaneous Q4H  . insulin aspart  3 Units Subcutaneous Q4H  . insulin glargine  40 Units Subcutaneous Daily  . methylPREDNISolone (SOLU-MEDROL) injection  125 mg Intravenous Q6H  . nystatin   Topical BID   ZOX:[id number], acetaminophen, fentaNYL, ondansetron (ZOFRAN) IV, ondansetron, promethazine  Results for orders placed during the hospital encounter of 02/24/13 (from the past 48 hour(s))  GLUCOSE, CAPILLARY     Status: Abnormal   Collection Time    03/01/13  3:52 PM      Result Value Range   Glucose-Capillary 315 (*) 70 - 99 mg/dL   Comment 1 Documented in Chart     Comment 2 Notify RN    GLUCOSE, CAPILLARY     Status: Abnormal   Collection Time    03/01/13  7:48 PM      Result Value Range   Glucose-Capillary 307 (*) 70 - 99 mg/dL   Comment 1 Documented in Chart     Comment 2 Notify RN    GLUCOSE, CAPILLARY     Status: Abnormal   Collection Time    03/01/13 11:56 PM      Result Value Range   Glucose-Capillary 313 (*) 70 - 99 mg/dL   Comment 1 Documented in Chart     Comment 2 Notify RN    GLUCOSE, CAPILLARY      Status: Abnormal   Collection Time    03/02/13  4:46 AM      Result Value Range   Glucose-Capillary 281 (*) 70 - 99 mg/dL   Comment 1 Documented in Chart     Comment 2 Notify RN    CBC     Status: Abnormal   Collection Time    03/02/13  5:16 AM      Result Value Range   WBC 10.3  4.0 - 10.5 K/uL   RBC 3.34 (*) 3.87 - 5.11 MIL/uL   Hemoglobin 9.3 (*) 12.0 - 15.0 g/dL   HCT 16.1 (*) 09.6 - 04.5 %   MCV 89.5  78.0 - 100.0 fL   MCH 27.8  26.0 - 34.0 pg   MCHC 31.1  30.0 - 36.0 g/dL   RDW 40.9 (*) 81.1 - 91.4 %   Platelets 352  150 - 400 K/uL  BASIC METABOLIC PANEL     Status: Abnormal   Collection Time    03/02/13  5:16 AM      Result Value Range   Sodium 146 (*) 135 - 145 mEq/L   Potassium 3.7  3.5 - 5.1 mEq/L   Chloride 96  96 - 112 mEq/L   CO2 36 (*) 19 - 32 mEq/L   Glucose, Bld 343 (*) 70 - 99 mg/dL   BUN 42 (*) 6 - 23 mg/dL   Creatinine, Ser 7.82 (*) 0.50 - 1.10 mg/dL   Calcium 9.5  8.4 - 95.6 mg/dL   GFR calc non Af Amer 31 (*) >90 mL/min   GFR calc Af Amer 36 (*) >90 mL/min   Comment: (NOTE)     The eGFR has been calculated using the CKD EPI equation.     This calculation has not been validated in all clinical situations.     eGFR's persistently <90 mL/min signify possible Chronic Kidney     Disease.  BLOOD GAS, ARTERIAL     Status: Abnormal   Collection Time    03/02/13  6:30 AM      Result Value Range   FIO2 0.35     Delivery systems VENTILATOR     Mode PRESSURE REGULATED VOLUME CONTROL     VT 450     Rate 16     Peep/cpap 5.0     pH, Arterial 7.426  7.350 - 7.450   pCO2 arterial 55.0 (*) 35.0 - 45.0 mmHg   pO2, Arterial 118.0 (*) 80.0 - 100.0 mmHg   Bicarbonate 35.5 (*) 20.0 - 24.0 mEq/L   TCO2 32.5  0 - 100 mmol/L   Acid-Base Excess 10.7 (*)  0.0 - 2.0 mmol/L   O2 Saturation 98.1     Patient temperature 37.0     Collection site RADIAL     Drawn by COLLECTED BY RT     Sample type ARTERIAL     Allens test (pass/fail) PASS  PASS  GLUCOSE, CAPILLARY      Status: Abnormal   Collection Time    03/02/13  7:44 AM      Result Value Range   Glucose-Capillary 336 (*) 70 - 99 mg/dL   Comment 1 Documented in Chart     Comment 2 Notify RN    GLUCOSE, CAPILLARY     Status: Abnormal   Collection Time    03/02/13 11:27 AM      Result Value Range   Glucose-Capillary 314 (*) 70 - 99 mg/dL   Comment 1 Documented in Chart     Comment 2 Notify RN    GLUCOSE, CAPILLARY     Status: Abnormal   Collection Time    03/02/13  3:54 PM      Result Value Range   Glucose-Capillary 269 (*) 70 - 99 mg/dL   Comment 1 Notify RN    GLUCOSE, CAPILLARY     Status: Abnormal   Collection Time    03/02/13  8:06 PM      Result Value Range   Glucose-Capillary 313 (*) 70 - 99 mg/dL   Comment 1 Notify RN    GLUCOSE, CAPILLARY     Status: Abnormal   Collection Time    03/02/13 11:46 PM      Result Value Range   Glucose-Capillary 319 (*) 70 - 99 mg/dL   Comment 1 Notify RN    GLUCOSE, CAPILLARY     Status: Abnormal   Collection Time    03/03/13  3:50 AM      Result Value Range   Glucose-Capillary 329 (*) 70 - 99 mg/dL   Comment 1 Notify RN    BLOOD GAS, ARTERIAL     Status: Abnormal   Collection Time    03/03/13  5:34 AM      Result Value Range   FIO2 0.35     Delivery systems VENTILATOR     Mode PRESSURE REGULATED VOLUME CONTROL     VT 450     Rate 16.0     Peep/cpap 5.0     pH, Arterial 7.394  7.350 - 7.450   pCO2 arterial 59.6 (*) 35.0 - 45.0 mmHg   Comment: CRITICAL RESULT CALLED TO, READ BACK BY AND VERIFIED WITH:     MICHAEL GRAY,RN BY BRITTANY FRETWELL,RRT,RCP ON 03/03/13 AT 0543   pO2, Arterial 119.0 (*) 80.0 - 100.0 mmHg   Bicarbonate 35.7 (*) 20.0 - 24.0 mEq/L   Acid-Base Excess 10.5 (*) 0.0 - 2.0 mmol/L   O2 Saturation 98.2     Patient temperature 37.0     Collection site RIGHT RADIAL     Drawn by [id number]     Sample type ARTERIAL DRAW     Allens test (pass/fail) PASS  PASS  GLUCOSE, CAPILLARY     Status: Abnormal   Collection Time     03/03/13  7:29 AM      Result Value Range   Glucose-Capillary 321 (*) 70 - 99 mg/dL   Comment 1 Documented in Chart     Comment 2 Notify RN    CBC WITH DIFFERENTIAL     Status: Abnormal   Collection Time    03/03/13  8:20 AM  Result Value Range   WBC 10.1  4.0 - 10.5 K/uL   RBC 3.45 (*) 3.87 - 5.11 MIL/uL   Hemoglobin 9.8 (*) 12.0 - 15.0 g/dL   HCT 16.1 (*) 09.6 - 04.5 %   MCV 89.9  78.0 - 100.0 fL   MCH 28.4  26.0 - 34.0 pg   MCHC 31.6  30.0 - 36.0 g/dL   RDW 40.9 (*) 81.1 - 91.4 %   Platelets 361  150 - 400 K/uL   Neutrophils Relative % 89 (*) 43 - 77 %   Neutro Abs 8.9 (*) 1.7 - 7.7 K/uL   Lymphocytes Relative 6 (*) 12 - 46 %   Lymphs Abs 0.6 (*) 0.7 - 4.0 K/uL   Monocytes Relative 6  3 - 12 %   Monocytes Absolute 0.6  0.1 - 1.0 K/uL   Eosinophils Relative 0  0 - 5 %   Eosinophils Absolute 0.0  0.0 - 0.7 K/uL   Basophils Relative 0  0 - 1 %   Basophils Absolute 0.0  0.0 - 0.1 K/uL  BASIC METABOLIC PANEL     Status: Abnormal   Collection Time    03/03/13  8:20 AM      Result Value Range   Sodium 142  135 - 145 mEq/L   Potassium 3.6  3.5 - 5.1 mEq/L   Chloride 94 (*) 96 - 112 mEq/L   CO2 37 (*) 19 - 32 mEq/L   Glucose, Bld 340 (*) 70 - 99 mg/dL   BUN 53 (*) 6 - 23 mg/dL   Creatinine, Ser 7.82 (*) 0.50 - 1.10 mg/dL   Calcium 9.3  8.4 - 95.6 mg/dL   GFR calc non Af Amer 33 (*) >90 mL/min   GFR calc Af Amer 39 (*) >90 mL/min   Comment: (NOTE)     The eGFR has been calculated using the CKD EPI equation.     This calculation has not been validated in all clinical situations.     eGFR's persistently <90 mL/min signify possible Chronic Kidney     Disease.  GLUCOSE, CAPILLARY     Status: Abnormal   Collection Time    03/03/13 11:02 AM      Result Value Range   Glucose-Capillary 290 (*) 70 - 99 mg/dL    Dg Chest Port 1 View  03/03/2013   CLINICAL DATA:  Respiratory failure  EXAM: PORTABLE CHEST - 1 VIEW  COMPARISON:  Portable exam 0746 hr compared to 02/28/2013   FINDINGS: Endotracheal tube and nasogastric tube stable.  Enlargement of cardiac silhouette with pulmonary vascular congestion.  Improved aeration particularly at the bases versus previous exam.  No pneumothorax or acute osseous findings.  IMPRESSION: Improved aeration.   Electronically Signed   By: Ulyses Southward M.D.   On: 03/03/2013 08:28   Review of Systems  Constitutional: Negative for fever and chills.  HENT: Negative for congestion.  Respiratory: Positive for history of cough and shortness of breath.  Cardiovascular: Negative for chest pain.  All other systems reviewed and are negative.  Blood pressure 193/90, pulse 72, temperature 98.1 F (36.7 C), temperature source Oral, resp. rate 32, height 5' (1.524 m), weight 173.6 kg (382 lb 11.5 oz), SpO2 100.00%.  General: Morbidly obese, no acute distress  Eyes: PERRL, EOMI, no scleral icterus  ENT: Nose without drainage. oropharynx without erythema or exudate. Mucous membranes of her mouth are pink and moist.  ET tube in place. Neck: Supple no JVD Cardiovascular: Paced regular  rate Respiratory: On vent.  No distress Skin: Warm and dry no rashes or lesions noted  Musculoskeletal: Joints without swelling or edema. Nontender to palpate  Psychiatric: Pleasant calm and responsive    Assessment/Plan: Chronic respiratory failure and ventilator dependence in the setting of morbid obesity and obstructive sleep apnea.  In light of the patient's clinical history, she will likely benefit from undergoing the tracheostomy tube placement procedure. The risks, benefits, alternatives, and details of the procedure are reviewed with the patient and her daughter. Questions are invited and answered. They would like to proceed with the procedure. Informed consent is obtained.  Tanielle Emigh,SUI W 03/03/2013, 11:56 AM

## 2013-03-03 NOTE — Progress Notes (Signed)
Inpatient Diabetes Program Recommendations  AACE/ADA: New Consensus Statement on Inpatient Glycemic Control (2013)  Target Ranges:  Prepandial:   less than 140 mg/dL      Peak postprandial:   less than 180 mg/dL (1-2 hours)      Critically ill patients:  140 - 180 mg/dL   Results for ANUHEA, GASSNER (MRN [id number]) as of 03/03/2013 07:45  Ref. Range 03/02/2013 04:46 03/02/2013 07:44 03/02/2013 11:27 03/02/2013 15:54 03/02/2013 20:06 03/02/2013 23:46 03/03/2013 03:50  Glucose-Capillary Latest Range: 70-99 mg/dL 409 (H) 811 (H) 914 (H) 269 (H) 313 (H) 319 (H) 329 (H)   Inpatient Diabetes Program Recommendations Insulin - Basal: Please order Lantus 40 units daily. Insulin - Meal Coverage: Please order Novolog 3 units Q4H for tube feeding coverage.   Note: Blood sugar has ranged from 269-336 mg/dl over the past 24 hours and patient has received a total of Novolog 86 units for correction over the past 24 hours.  Patient is ordered to receive Solumedrol 125 mg Q6H which is contributing to hyperglycemia.  Please order basal insulin; recommend Lantus 40 units daily and please order Novolog 3 units Q4H for tube feeding coverage if patient will remain intubated and on tube feedings.  Thanks, Orlando Penner, RN, MSN, CCRN Diabetes Coordinator Inpatient Diabetes Program [phone] (Team Pager) [phone] ([facility] office) [phone] [facility] office)

## 2013-03-03 NOTE — Progress Notes (Addendum)
Subjective: She remains intubated on the ventilator. She still has a significant amount of secretions. Her endotracheal tube is only a #6. Her blood sugar has gone up since she's been on steroids which are an attempt to reduce her stridor.  Objective: Vital signs in last 24 hours: Temp:  [97.7 F (36.5 C)-98.6 F (37 C)] 98.5 F (36.9 C) (10/14 0400) Pulse Rate:  [56-66] 60 (10/14 0500) Resp:  [16-19] 19 (10/13 1556) BP: (131-193)/(72-153) 182/95 mmHg (10/14 0500) SpO2:  [92 %-100 %] 98 % (10/14 0500) FiO2 (%):  [35 %] 35 % (10/14 0400) Weight change:  Last BM Date: 02/22/13  Intake/Output from previous day: 10/13 0701 - 10/14 0700 In: 1620 [P.O.:60; I.V.:240; NG/GT:1320] Out: 2325 [Urine:2325]  PHYSICAL EXAM General appearance: alert, cooperative, mild distress and morbidly obese Resp: rhonchi bilaterally Cardio: regular rate and rhythm, S1, S2 normal, no murmur, click, rub or gallop GI: soft, non-tender; bowel sounds normal; no masses,  no organomegaly Extremities: Chronic venous stasis dermatitis and lymphedema  Lab Results:    Basic Metabolic Panel:  Recent Labs  16/10/96 0522 03/02/13 0516  NA 143 146*  K 3.6 3.7  CL 95* 96  CO2 36* 36*  GLUCOSE 299* 343*  BUN 31* 42*  CREATININE 1.66* 1.75*  CALCIUM 9.6 9.5   Liver Function Tests:  Recent Labs  03/01/13 0522  AST 18  ALT 20  ALKPHOS 87  BILITOT 0.2*  PROT 7.8  ALBUMIN 3.2*   No results found for this basename: LIPASE, AMYLASE,  in the last 72 hours No results found for this basename: AMMONIA,  in the last 72 hours CBC:  Recent Labs  03/01/13 0522 03/02/13 0516  WBC 14.0* 10.3  HGB 8.8* 9.3*  HCT 27.6* 29.9*  MCV 89.6 89.5  PLT 308 352   Cardiac Enzymes: No results found for this basename: CKTOTAL, CKMB, CKMBINDEX, TROPONINI,  in the last 72 hours BNP: No results found for this basename: PROBNP,  in the last 72 hours D-Dimer: No results found for this basename: DDIMER,  in the last  72 hours CBG:  Recent Labs  03/02/13 0744 03/02/13 1127 03/02/13 1554 03/02/13 2006 03/02/13 2346 03/03/13 0350  GLUCAP 336* 314* 269* 313* 319* 329*   Hemoglobin A1C: No results found for this basename: HGBA1C,  in the last 72 hours Fasting Lipid Panel: No results found for this basename: CHOL, HDL, LDLCALC, TRIG, CHOLHDL, LDLDIRECT,  in the last 72 hours Thyroid Function Tests: No results found for this basename: TSH, T4TOTAL, FREET4, T3FREE, THYROIDAB,  in the last 72 hours Anemia Panel: No results found for this basename: VITAMINB12, FOLATE, FERRITIN, TIBC, IRON, RETICCTPCT,  in the last 72 hours Coagulation: No results found for this basename: LABPROT, INR,  in the last 72 hours Urine Drug Screen: Drugs of Abuse     Component Value Date/Time   LABOPIA NEGATIVE 02/25/2009 1755   LABOPIA NONE DETECTED 06/05/2007 1610   COCAINSCRNUR NEGATIVE 02/25/2009 1755   COCAINSCRNUR NONE DETECTED 06/05/2007 1610   LABBENZ NEGATIVE 02/25/2009 1755   LABBENZ NONE DETECTED 06/05/2007 1610   AMPHETMU NEGATIVE 02/25/2009 1755   AMPHETMU NONE DETECTED 06/05/2007 1610   THCU NONE DETECTED 06/05/2007 1610   LABBARB  Value: NONE DETECTED        DRUG SCREEN FOR MEDICAL PURPOSES ONLY.  IF CONFIRMATION IS NEEDED FOR ANY PURPOSE, NOTIFY LAB WITHIN 5 DAYS. 06/05/2007 1610    Alcohol Level: No results found for this basename: ETH,  in the last 72  hours Urinalysis: No results found for this basename: COLORURINE, APPERANCEUR, LABSPEC, PHURINE, GLUCOSEU, HGBUR, BILIRUBINUR, KETONESUR, PROTEINUR, UROBILINOGEN, NITRITE, LEUKOCYTESUR,  in the last 72 hours Misc. Labs:  ABGS  Recent Labs  03/02/13 0630 03/03/13 0534  PHART 7.426 7.394  PO2ART 118.0* 119.0*  TCO2 32.5  --   HCO3 35.5* 35.7*   CULTURES Recent Results (from the past 240 hour(s))  MRSA PCR SCREENING     Status: None   Collection Time    02/24/13  5:30 PM      Result Value Range Status   MRSA by PCR NEGATIVE  NEGATIVE Final    Comment:            The GeneXpert MRSA Assay (FDA     approved for NASAL specimens     only), is one component of a     comprehensive MRSA colonization     surveillance program. It is not     intended to diagnose MRSA     infection nor to guide or     monitor treatment for     MRSA infections.   Studies/Results: No results found.  Medications:  Prior to Admission:  Prescriptions prior to admission  Medication Sig Dispense Refill  . albuterol (PROVENTIL) (2.5 MG/3ML) 0.083% nebulizer solution Take 2.5 mg by nebulization every 6 (six) hours as needed. For shortness of breath      . allopurinol (ZYLOPRIM) 300 MG tablet Take 300 mg by mouth daily.      Marland Kitchen ALPRAZolam (XANAX) 0.5 MG tablet Take 0.5 mg by mouth 2 (two) times daily.      . carvedilol (COREG) 25 MG tablet Take 25 mg by mouth 2 (two) times daily.      . cloNIDine (CATAPRES) 0.2 MG tablet Take 0.2 mg by mouth 2 (two) times daily.      Marland Kitchen diltiazem (CARDIZEM) 30 MG tablet Take 1 tablet (30 mg total) by mouth every 6 (six) hours.  120 tablet  12  . ferrous sulfate 325 (65 FE) MG tablet Take 325 mg by mouth daily.        . hydrALAZINE (APRESOLINE) 100 MG tablet Take 100 mg by mouth 4 (four) times daily.      . insulin lispro protamine-insulin lispro (HUMALOG 75/25) (75-25) 100 UNIT/ML SUSP Inject 20 Units into the skin 3 (three) times daily.       . Multiple Vitamins-Minerals (MULTIVITAMINS THER. W/MINERALS) TABS Take 1 tablet by mouth daily.        Marland Kitchen neomycin-bacitracin-polymyxin (NEOSPORIN) [phone] ointment APPLY TO YOUR LEGS ON EVERY Tuesday, Thursday, AND Saturday.  28.3 g  0  . nystatin (MYCOSTATIN) cream APPLY TO YOUR SKIN FOLDS AND LEGS EVERY Monday, Wednesday , AND Friday.  30 g  1  . spironolactone (ALDACTONE) 25 MG tablet Take 25 mg by mouth daily.      Marland Kitchen torsemide (DEMADEX) 100 MG tablet Take 50 mg by mouth 2 (two) times daily.      . vitamin C (ASCORBIC ACID) 500 MG tablet Take 500 mg by mouth daily.        Scheduled: . albuterol  2.5 mg Nebulization Q4H  . antiseptic oral rinse  15 mL Mouth Rinse QID  . carvedilol  25 mg Oral BID WC  . chlorhexidine  15 mL Mouth Rinse BID  . cloNIDine  0.2 mg Oral BID  . diltiazem  30 mg Oral Q6H  . ferrous sulfate  300 mg Per Tube BID WC  . furosemide  60  mg Intravenous TID  . hydrALAZINE  100 mg Oral Q6H  . insulin aspart  0-20 Units Subcutaneous Q4H  . methylPREDNISolone (SOLU-MEDROL) injection  125 mg Intravenous Q6H  . nystatin   Topical BID  . pantoprazole (PROTONIX) IV  40 mg Intravenous Daily  . potassium chloride  40 mEq Per Tube Daily   Continuous: . sodium chloride 10 mL/hr at 03/03/13 0600  . feeding supplement (GLUCERNA 1.2 CAL) 1,000 mL (03/03/13 0600)   ZOX:[id number], acetaminophen, fentaNYL, midazolam, ondansetron (ZOFRAN) IV, ondansetron, promethazine  Assesment: She was admitted with acute on chronic diastolic heart failure. She's developed respiratory failure which is acute on chronic. She was able to be extubated but then developed stridor and almost immediately had to be reintubated. Her blood sugar has been elevated. She has morbid obesity. She has obesity hypoventilation and sleep apnea. She had stridor and required a very small endotracheal tube. She is still having significant secretions so she's not really a candidate for extubation at this point but when she gets ready were probably going to have to have surgical standby and anesthesia standby. If she has to have emergent tracheostomy this would be difficult  Because of her body habitus. Principal Problem:   Acute on chronic diastolic heart failure Active Problems:   DM (diabetes mellitus), type 2, uncontrolled with complications   HYPERLIPIDEMIA   OBESITY, MORBID   Anemia, iron deficiency   SLEEP APNEA, OBSTRUCTIVE   HYPERTENSION   CORONARY ARTERY DISEASE   GERD   PACEMAKER, PERMANENT   Obesity hypoventilation syndrome   SVT (supraventricular tachycardia)   Acute  respiratory failure    Plan: I will plan to add Lantus. She will also receive NovoLog. I discussed her situation with Dr. Jayme Cloud our anesthesiologist and he is very concerned about trying to extubate her here because she was such a difficult intubation and with her body habitus we don't have appropriate equipment to do an emergency tracheostomy on her. She may need transfer to [facility]. I discussed her situation with Dr. Sung Amabile critical care medicine at [facility] and he agrees it would be best to go ahead and transfer her to a higher level of care    LOS: 7 days   Ronin Crager L 03/03/2013, 7:51 AM

## 2013-03-03 NOTE — Progress Notes (Signed)
Report called to 2100.  Patient is ready for transport to [facility].  Patient is emotional and does not want to leave [facility] but is okay with transferring.  Family at the bedside.  No acute distress noted.  Patient currently on her way to [facility] via [facility]

## 2013-03-03 NOTE — Consult Note (Signed)
PULMONARY  / CRITICAL CARE MEDICINE  Name: [name] MRN: [id number] DOB: 16-Dec-1955    ADMISSION DATE:  02/24/2013 CONSULTATION DATE:  03/03/2013  REFERRING MD :  Kari Baars PRIMARY SERVICE: PCCM  CHIEF COMPLAINT:  VDRF due to OHS, pulm edema  BRIEF PATIENT DESCRIPTION:  57 y/o female admitted 10/07 to [facility] with hypercarbic failure due to OHS and CHF. Failed extubation 10/11 and difficult re-intubation (6.0 ETT). Transferred to [facility] ICU 10/14 for further eval and mgmt  SIGNIFICANT EVENTS / STUDIES:  10/7 Admitted for hypercarbic resp failure, OHS, CHF.  Intubated 10/9 transient SVT controlled with IV cardizem, transitioned to PO cardizem and coreg 10/11 pt extubated and re-intubated, steroids, difficult re-intubation used # 6 tube 10/14 transferred to [facility] ICU, likely needs trach  LINES / TUBES: ETT 10/07 >> 10/11, 10/11>>>  CULTURES: MRSA PCR 10/7 >> NEG  ANTIBIOTICS: None  HISTORY OF PRESENT ILLNESS:  [name] has been transferred to [facility] ICU after being extubated on 10/11 developing stridor and having a very difficult re-intubation. Her body habitus makes her technically challenging to place a trach warranting her transfer here. She presented with palpitations, and developed resp failure requiring intubation on the 9th. She has films c/w fluid fluid overload and subsequent pulmonary edema and has been diuresed 10 L throughout her admission. She has been bed-ridden for several years. She has Paroxysmal SVT which has been controlled now with oral meds. She has a PPM that was placed after she developed symptomatic bradycardia in 2010. It was not pacing at one point in the ED when she sustained a HR in the 30s and 40s.   She is currently complaining of pain in her BL legs and requesting ice chips. She denies chest pain or confusion.   PAST MEDICAL HISTORY :  Past Medical History  Diagnosis Date  . Morbid obesity   . Essential hypertension, benign   . Chronic diastolic  heart failure     LVEF 60-65% 2011  . COPD (chronic obstructive pulmonary disease)     Oxygen dependent  . History of cardiac catheterization     Normal coronary arteries 2008  . Obesity hypoventilation syndrome   . Arthritis   . Hiatal hernia   . Symptomatic bradycardia     With pauses 2008 - Followed by Dr. Graciela Husbands  . Cellulitis     Recurrent bilateral LE  . Mixed hyperlipidemia   . Peripheral vascular disease   . Myocardial infarction 1990's  . Sleep apnea   . Type II diabetes mellitus   . Gout   . Nonsustained ventricular tachycardia     Documented 2008  . SVT (supraventricular tachycardia)     Reported possible atrial arrhythmias   Past Surgical History  Procedure Laterality Date  . Abdominal hysterectomy  1980's  . Insert / replace / remove pacemaker  2010   Prior to Admission medications   Medication Sig Start Date End Date Taking? Authorizing Provider  albuterol (PROVENTIL) (2.5 MG/3ML) 0.083% nebulizer solution Take 2.5 mg by nebulization every 6 (six) hours as needed. For shortness of breath   Yes Historical Provider, MD  allopurinol (ZYLOPRIM) 300 MG tablet Take 300 mg by mouth daily.   Yes Historical Provider, MD  ALPRAZolam [name]) 0.5 MG tablet Take 0.5 mg by mouth 2 (two) times daily.   Yes Historical Provider, MD  carvedilol (COREG) 25 MG tablet Take 25 mg by mouth 2 (two) times daily.   Yes Historical Provider, MD  cloNIDine (CATAPRES) 0.2 MG  tablet Take 0.2 mg by mouth 2 (two) times daily.   Yes Historical Provider, MD  diltiazem (CARDIZEM) 30 MG tablet Take 1 tablet (30 mg total) by mouth every 6 (six) hours. 03/04/12  Yes Fredirick Maudlin, MD  ferrous sulfate 325 (65 FE) MG tablet Take 325 mg by mouth daily.     Yes Historical Provider, MD  hydrALAZINE (APRESOLINE) 100 MG tablet Take 100 mg by mouth 4 (four) times daily.   Yes Historical Provider, MD  insulin lispro protamine-insulin lispro (HUMALOG 75/25) (75-25) 100 UNIT/ML SUSP Inject 20 Units into the skin  3 (three) times daily.  04/02/11  Yes Elliot Cousin, MD  Multiple Vitamins-Minerals (MULTIVITAMINS THER. W/MINERALS) TABS Take 1 tablet by mouth daily.     Yes Historical Provider, MD  neomycin-bacitracin-polymyxin (NEOSPORIN) [phone] ointment APPLY TO YOUR LEGS ON EVERY Tuesday, Thursday, AND Saturday. 04/02/11  Yes Elliot Cousin, MD  nystatin (MYCOSTATIN) cream APPLY TO YOUR SKIN FOLDS AND LEGS EVERY Monday, Wednesday , AND Friday. 04/02/11  Yes Elliot Cousin, MD  spironolactone (ALDACTONE) 25 MG tablet Take 25 mg by mouth daily.   Yes Historical Provider, MD  torsemide (DEMADEX) 100 MG tablet Take 50 mg by mouth 2 (two) times daily.   Yes Historical Provider, MD  vitamin C (ASCORBIC ACID) 500 MG tablet Take 500 mg by mouth daily.   Yes Historical Provider, MD   Allergies  Allergen Reactions  . Morphine And Related Hives    FAMILY HISTORY:  Family History  Problem Relation Age of Onset  . Coronary artery disease Mother     MI age 64, died.  . Hypertension Father   . Osteoarthritis Father   . Hypertension Sister   . Hypertension Brother   . Obesity Father    SOCIAL HISTORY:  reports that she quit smoking about 45 years ago. Her smoking use included Cigarettes. She has a .05 pack-year smoking history. She has never used smokeless tobacco. She reports that she does not drink alcohol or use illicit drugs.  REVIEW OF SYSTEMS:  Per HPI  SUBJECTIVE: Per HPI  VITAL SIGNS: Temp:  [97.7 F (36.5 C)-98.6 F (37 C)] 98 F (36.7 C) (10/14 0800) Pulse Rate:  [56-89] 67 (10/14 0900) Resp:  [16-19] 16 (10/14 0800) BP: (131-193)/(72-153) 193/90 mmHg (10/14 0900) SpO2:  [92 %-100 %] 100 % (10/14 0900) FiO2 (%):  [35 %] 35 % (10/14 0842) HEMODYNAMICS:   VENTILATOR SETTINGS: Vent Mode:  [-] PRVC FiO2 (%):  [35 %] 35 % Set Rate:  [16 bmp] 16 bmp Vt Set:  [450 mL] 450 mL PEEP:  [5 cmH20] 5 cmH20 Pressure Support:  [5 cmH20] 5 cmH20 Plateau Pressure:  [27 cmH20-30 cmH20] 28  cmH20 INTAKE / OUTPUT: Intake/Output     10/13 0701 - 10/14 0700 10/14 0701 - 10/15 0700   P.O. 60 60   I.V. (mL/kg) 240 (1.4) 30 (0.2)   NG/GT 1320 165   Total Intake(mL/kg) 1620 (9.3) 255 (1.5)   Urine (mL/kg/hr) 2325 (0.6) 400 (0.5)   Total Output 2325 400   Net -705 -145          PHYSICAL EXAMINATION: Gen: intubated, morbidly obese, lying in bed HEENT: NCAT, EOMI, PERRL, MMM CV: RRR, paced on tele monitor,  good S1/S2, no murmur Resp: CTABL, no wheezes, good air movement Abd: SNTND, BS present, no guarding or organomegaly Ext: chronic venous stasis changes on BL LE Neuro: Alert, 4/5 strength in all 4 extremities, sensation intact in all  LABS:  CBC Recent Labs     03/01/13  0522  03/02/13  0516  03/03/13  0820  WBC  14.0*  10.3  10.1  HGB  8.8*  9.3*  9.8*  HCT  27.6*  29.9*  31.0*  PLT  308  352  361   Coag's No results found for this basename: APTT, INR,  in the last 72 hours BMET Recent Labs     03/01/13  0522  03/02/13  0516  03/03/13  0820  NA  143  146*  142  K  3.6  3.7  3.6  CL  95*  96  94*  CO2  36*  36*  37*  BUN  31*  42*  53*  CREATININE  1.66*  1.75*  1.65*  GLUCOSE  299*  343*  340*   Electrolytes Recent Labs     03/01/13  0522  03/02/13  0516  03/03/13  0820  CALCIUM  9.6  9.5  9.3   Sepsis Markers No results found for this basename: LACTICACIDVEN, PROCALCITON, O2SATVEN,  in the last 72 hours ABG Recent Labs     03/01/13  0556  03/02/13  0630  03/03/13  0534  PHART  7.435  7.426  7.394  PCO2ART  54.6*  55.0*  59.6*  PO2ART  98.2  118.0*  119.0*   Liver Enzymes Recent Labs     03/01/13  0522  AST  18  ALT  20  ALKPHOS  87  BILITOT  0.2*  ALBUMIN  3.2*   Cardiac Enzymes No results found for this basename: TROPONINI, PROBNP,  in the last 72 hours Glucose Recent Labs     03/02/13  1127  03/02/13  1554  03/02/13  2006  03/02/13  2346  03/03/13  0350  03/03/13  0729  GLUCAP  314*  269*  313*  319*  329*   321*    Imaging Dg Chest Port 1 View  03/03/2013   CLINICAL DATA:  Respiratory failure  EXAM: PORTABLE CHEST - 1 VIEW  COMPARISON:  Portable exam 0746 hr compared to 02/28/2013  FINDINGS: Endotracheal tube and nasogastric tube stable.  Enlargement of cardiac silhouette with pulmonary vascular congestion.  Improved aeration particularly at the bases versus previous exam.  No pneumothorax or acute osseous findings.  IMPRESSION: Improved aeration.   Electronically Signed   By: Ulyses Southward M.D.   On: 03/03/2013 08:28     CXR:  Improved infiltrates consistent with improving pulmonary edema.   ASSESSMENT / PLAN:  PULMONARY A: Acute on chronic hypercarbic respiratory failure Obesity hypoventilation syndrome Probable OSA Difficult airway P:   Vent settings established Vent bundle implemented ENT consulted to eval for Trach Scheduled BDs Daily SBT after trach tube placed  CARDIOVASCULAR A:  Acute on chronic diastolic congestive heart failure, neg 10 L for admission Paroxsymal SVT Symptomatic bradycardia s/p PPM placement, currently paced HTN P:  Hold diuresis currently Continue clonidine, hydralazine, diltiazem, coreg  RENAL A:   AKI, exacerbated by diuresis Compensatory elevation in HCO3 P:   KVO fluids Hold diuretics today Monitor BMET intermittently Correct electrolytes as indicated   GASTROINTESTINAL A:   Morbid obesity P:   SUP: IV famotidine Tube feeds ordered, hold after midnight for procedure  HEMATOLOGIC A:   Normocytic anemia- likely of chronic disease P:  Trend CBC Continue iron  INFECTIOUS A:   No acute processes P:   Routine monitoring  ENDOCRINE A:   T2DM P:   Lantus +  SSI  NEUROLOGIC A:   No acute concerns P:   Rass goal of 0 PRN fentanyl  TODAY'S SUMMARY: Will continue vent on pressure support, ENT consult for placing trach.    Murtis Sink, MD [facility] Family Medicine Resident, PGY-2 03/03/2013, 11:12 AM   I have  interviewed and examined the patient and reviewed the database. I have formulated the assessment and plan as reflected in the note above with amendments made by me. 35 mins of direct critical care time provided. Family updated in detail  Billy Fischer, MD;  PCCM service; Mobile [phone]

## 2013-03-04 ENCOUNTER — Inpatient Hospital Stay (HOSPITAL_COMMUNITY): Payer: Medicaid Other

## 2013-03-04 ENCOUNTER — Inpatient Hospital Stay (HOSPITAL_COMMUNITY): Payer: Medicaid Other | Admitting: Anesthesiology

## 2013-03-04 ENCOUNTER — Encounter (HOSPITAL_COMMUNITY): Payer: Medicaid Other | Admitting: Anesthesiology

## 2013-03-04 ENCOUNTER — Encounter (HOSPITAL_COMMUNITY)
Admission: EM | Disposition: A | Discharge status: Home Health | Payer: Self-pay | Source: Home / Self Care | Attending: Pulmonary Disease

## 2013-03-04 ENCOUNTER — Encounter (HOSPITAL_COMMUNITY): Payer: Self-pay

## 2013-03-04 DIAGNOSIS — E662 Morbid (severe) obesity with alveolar hypoventilation: Secondary | ICD-10-CM

## 2013-03-04 DIAGNOSIS — G4733 Obstructive sleep apnea (adult) (pediatric): Secondary | ICD-10-CM

## 2013-03-04 HISTORY — PX: TRACHEOSTOMY TUBE PLACEMENT: SHX814

## 2013-03-04 LAB — GLUCOSE, CAPILLARY
Glucose-Capillary: 278 mg/dL — ABNORMAL HIGH (ref 70–99)
Glucose-Capillary: 254 mg/dL — ABNORMAL HIGH (ref 70–99)
Glucose-Capillary: 234 mg/dL — ABNORMAL HIGH (ref 70–99)
Glucose-Capillary: 249 mg/dL — ABNORMAL HIGH (ref 70–99)
Glucose-Capillary: 257 mg/dL — ABNORMAL HIGH (ref 70–99)

## 2013-03-04 LAB — BLOOD GAS, ARTERIAL
Bicarbonate: 37.8 mEq/L — ABNORMAL HIGH (ref 20.0–24.0)
Drawn by: 252031
MECHVT: 450 mL
O2 Saturation: 99 %
PEEP: 5 cmH2O
Patient temperature: 99.2
RATE: 16 resp/min
pCO2 arterial: 70.2 mmHg (ref 35.0–45.0)
pH, Arterial: 7.353 (ref 7.350–7.450)
pO2, Arterial: 149 mmHg — ABNORMAL HIGH (ref 80.0–100.0)

## 2013-03-04 LAB — BASIC METABOLIC PANEL
BUN: 59 mg/dL — ABNORMAL HIGH (ref 6–23)
CO2: 36 mEq/L — ABNORMAL HIGH (ref 19–32)
Calcium: 8.7 mg/dL (ref 8.4–10.5)
Chloride: 99 mEq/L (ref 96–112)
Creatinine, Ser: 1.54 mg/dL — ABNORMAL HIGH (ref 0.50–1.10)
GFR calc Af Amer: 42 mL/min — ABNORMAL LOW (ref >90)
Sodium: 148 mEq/L — ABNORMAL HIGH (ref 135–145)

## 2013-03-04 LAB — CBC
HCT: 31 % — ABNORMAL LOW (ref 36.0–46.0)
Hemoglobin: 10 g/dL — ABNORMAL LOW (ref 12.0–15.0)
MCHC: 32.3 g/dL (ref 30.0–36.0)
MCV: 88.1 fL (ref 78.0–100.0)
Platelets: 333 10*3/uL (ref 150–400)
RDW: 16.9 % — ABNORMAL HIGH (ref 11.5–15.5)

## 2013-03-04 SURGERY — CREATION, TRACHEOSTOMY
Anesthesia: General | Wound class: Clean Contaminated

## 2013-03-04 MED ORDER — FENTANYL CITRATE 0.05 MG/ML IJ SOLN
INTRAMUSCULAR | Status: DC | PRN
  Administered 2013-03-04 (×2): 50 ug via INTRAVENOUS

## 2013-03-04 MED ORDER — LIDOCAINE-EPINEPHRINE 1 %-1:100000 IJ SOLN
INTRAMUSCULAR | Status: DC | PRN
  Administered 2013-03-04: 4 mL

## 2013-03-04 MED ORDER — LIDOCAINE-EPINEPHRINE 1 %-1:100000 IJ SOLN
INTRAMUSCULAR | Status: AC
  Filled 2013-03-04: qty 1

## 2013-03-04 MED ORDER — INSULIN GLARGINE 100 UNIT/ML ~~LOC~~ SOLN
30.0000 [IU] | Freq: Every day | SUBCUTANEOUS | Status: DC
  Administered 2013-03-05: 30 [IU] via SUBCUTANEOUS
  Filled 2013-03-04 (×3): qty 0.3

## 2013-03-04 MED ORDER — LACTATED RINGERS IV SOLN
INTRAVENOUS | Status: DC | PRN
  Administered 2013-03-04: 09:00:00 via INTRAVENOUS

## 2013-03-04 MED ORDER — FAMOTIDINE IN NACL 20-0.9 MG/50ML-% IV SOLN
20.0000 mg | Freq: Two times a day (BID) | INTRAVENOUS | Status: DC
  Administered 2013-03-04 – 2013-03-05 (×2): 20 mg via INTRAVENOUS
  Filled 2013-03-04 (×3): qty 50

## 2013-03-04 MED ORDER — MIDAZOLAM HCL 5 MG/5ML IJ SOLN
INTRAMUSCULAR | Status: DC | PRN
  Administered 2013-03-04 (×2): 1 mg via INTRAVENOUS

## 2013-03-04 MED ORDER — INSULIN ASPART 100 UNIT/ML ~~LOC~~ SOLN
3.0000 [IU] | SUBCUTANEOUS | Status: DC
  Administered 2013-03-05 – 2013-03-27 (×131): 3 [IU] via SUBCUTANEOUS

## 2013-03-04 MED ORDER — "THROMBI-PAD 3""X3"" EX PADS"
1.0000 | MEDICATED_PAD | Freq: Once | CUTANEOUS | Status: AC
  Administered 2013-03-05: 1 via TOPICAL
  Filled 2013-03-04: qty 1

## 2013-03-04 MED ORDER — POTASSIUM CHLORIDE 20 MEQ/15ML (10%) PO LIQD
40.0000 meq | Freq: Once | ORAL | Status: AC
  Administered 2013-03-04: 40 meq
  Filled 2013-03-04: qty 30

## 2013-03-04 SURGICAL SUPPLY — 43 items
BLADE SURG 11 STRL SS (BLADE) IMPLANT
BLADE SURG 15 STRL LF DISP TIS (BLADE) IMPLANT
BLADE SURG 15 STRL SS (BLADE)
BLADE SURG ROTATE 9660 (MISCELLANEOUS) IMPLANT
CANISTER SUCTION 2500CC (MISCELLANEOUS) ×2 IMPLANT
CLEANER TIP ELECTROSURG 2X2 (MISCELLANEOUS) ×2 IMPLANT
CLIP TI WIDE RED SMALL 6 (CLIP) ×1 IMPLANT
CLOTH BEACON ORANGE TIMEOUT ST (SAFETY) ×2 IMPLANT
COVER SURGICAL LIGHT HANDLE (MISCELLANEOUS) ×2 IMPLANT
DECANTER SPIKE VIAL GLASS SM (MISCELLANEOUS) ×2 IMPLANT
ELECT COATED BLADE 2.86 ST (ELECTRODE) ×2 IMPLANT
ELECT REM PT RETURN 9FT ADLT (ELECTROSURGICAL) ×2
ELECTRODE REM PT RTRN 9FT ADLT (ELECTROSURGICAL) ×1 IMPLANT
GAUZE SPONGE 4X4 16PLY XRAY LF (GAUZE/BANDAGES/DRESSINGS) IMPLANT
GAUZE XEROFORM 5X9 LF (GAUZE/BANDAGES/DRESSINGS) IMPLANT
GLOVE ECLIPSE 7.5 STRL STRAW (GLOVE) ×2 IMPLANT
GOWN STRL NON-REIN LRG LVL3 (GOWN DISPOSABLE) ×6 IMPLANT
HEMOSTAT SURGICEL 2X14 (HEMOSTASIS) ×1 IMPLANT
HOLDER TRACH TUBE VELCRO 19.5 (MISCELLANEOUS) IMPLANT
INTRODUCER ADULT BOUGIE ×1 IMPLANT
KIT BASIN OR (CUSTOM PROCEDURE TRAY) ×2 IMPLANT
KIT ROOM TURNOVER OR (KITS) ×2 IMPLANT
KIT SUCTION CATH 14FR (SUCTIONS) IMPLANT
NDL HYPO 25GX1X1/2 BEV (NEEDLE) ×1 IMPLANT
NEEDLE HYPO 25GX1X1/2 BEV (NEEDLE) ×2 IMPLANT
NS IRRIG 1000ML POUR BTL (IV SOLUTION) ×2 IMPLANT
PAD ARMBOARD 7.5X6 YLW CONV (MISCELLANEOUS) ×4 IMPLANT
PENCIL BUTTON HOLSTER BLD 10FT (ELECTRODE) IMPLANT
SPONGE GAUZE 4X4 12PLY (GAUZE/BANDAGES/DRESSINGS) ×1 IMPLANT
SPONGE INTESTINAL PEANUT (DISPOSABLE) ×2 IMPLANT
SUT CHROMIC 3 0 SH 27 (SUTURE) IMPLANT
SUT PROLENE 2 0 SH DA (SUTURE) ×2 IMPLANT
SUT SILK 3 0 (SUTURE) ×2
SUT SILK 3-0 18XBRD TIE 12 (SUTURE) ×1 IMPLANT
SYR 20CC LL (SYRINGE) IMPLANT
SYR BULB 3OZ (MISCELLANEOUS) IMPLANT
SYR CONTROL 10ML LL (SYRINGE) IMPLANT
TOWEL OR 17X24 6PK STRL BLUE (TOWEL DISPOSABLE) ×2 IMPLANT
TOWEL OR 17X26 10 PK STRL BLUE (TOWEL DISPOSABLE) ×2 IMPLANT
TRAY ENT MC OR (CUSTOM PROCEDURE TRAY) ×2 IMPLANT
TUBE CONNECTING 12X1/4 (SUCTIONS) ×2 IMPLANT
TUBE TRACH 6.0 EXL PROX CUF (TUBING) ×1 IMPLANT
WATER STERILE IRR 1000ML POUR (IV SOLUTION) ×2 IMPLANT

## 2013-03-04 NOTE — Progress Notes (Signed)
eLink Physician-Brief Progress Note Patient Name: [name] DOB: 1955-06-14 MRN: [id number]  Date of Service  03/04/2013   HPI/Events of Note  Hypokalemia   eICU Interventions  Potassium replaced   Intervention Category Minor Interventions: Electrolytes abnormality - evaluation and management  DETERDING,ELIZABETH 03/04/2013, 5:14 AM

## 2013-03-04 NOTE — Progress Notes (Signed)
PULMONARY  / CRITICAL CARE MEDICINE  Name: [name] MRN: [id number] DOB: 1956/03/18    ADMISSION DATE:  02/24/2013 CONSULTATION DATE:  03/03/2013  REFERRING MD :  Kari Baars PRIMARY SERVICE: PCCM  CHIEF COMPLAINT:  VDRF due to OHS, pulm edema  BRIEF PATIENT DESCRIPTION:  57 y/o female admitted 10/07 to [facility] with hypercarbic failure due to OHS and CHF. Failed extubation 10/11 and difficult re-intubation (6.0 ETT). Transferred to [facility] ICU 10/14 for further eval and mgmt  SIGNIFICANT EVENTS / STUDIES:  10/7 Admitted for hypercarbic resp failure, OHS, CHF.  Intubated 10/9 transient SVT controlled with IV cardizem, transitioned to PO cardizem and coreg 10/11 pt extubated and re-intubated, steroids, difficult re-intubation used # 6 tube 10/14 transferred to [facility] ICU, likely needs trach  LINES / TUBES: ETT 10/07 >> 10/11, 10/11>>>  CULTURES: MRSA PCR 10/7 >> NEG  ANTIBIOTICS: None  SUBJECTIVE:  No acute events overnight, RN reports HTN and hyperglycemia. Pt has continued BL LE pain which is chronic.   VITAL SIGNS: Temp:  [97.7 F (36.5 C)-99.3 F (37.4 C)] 99.2 F (37.3 C) (10/15 0340) Pulse Rate:  [58-89] 60 (10/15 0600) Resp:  [14-32] 16 (10/15 0600) BP: (120-195)/(58-131) 192/95 mmHg (10/15 0600) SpO2:  [99 %-100 %] 100 % (10/15 0600) FiO2 (%):  [35 %-40 %] 40 % (10/15 0321) Weight:  [382 lb (173.274 kg)] 382 lb (173.274 kg) (10/14 1200) HEMODYNAMICS:   VENTILATOR SETTINGS: Vent Mode:  [-] PRVC FiO2 (%):  [35 %-40 %] 40 % Set Rate:  [16 bmp] 16 bmp Vt Set:  [450 mL] 450 mL PEEP:  [5 cmH20] 5 cmH20 Pressure Support:  [15 cmH20] 15 cmH20 Plateau Pressure:  [20 cmH20-29 cmH20] 20 cmH20 INTAKE / OUTPUT: Intake/Output     10/14 0701 - 10/15 0700 10/15 0701 - 10/16 0700   P.O. 60    I.V. (mL/kg) 360 (2.1)    NG/GT 960    IV Piggyback 50    Total Intake(mL/kg) 1430 (8.3)    Urine (mL/kg/hr) 2465 (0.6)    Total Output 2465     Net -1035             PHYSICAL EXAMINATION: Gen: intubated, morbidly obese, lying in bed HEENT: NCAT, EOMI, MMM CV: RRR, paced on tele monitor,  good S1/S2, no murmur Resp: CTABL, no wheezes, good air movement Abd: SNTND, BS present, no guarding or organomegaly Ext: chronic venous stasis changes on BL LE, trace pitting edema BL Neuro: Alert, 4/5 strength in all 4 extremities, sensation intact in all   LABS:  CBC Recent Labs     03/02/13  0516  03/03/13  0820  03/04/13  0350  WBC  10.3  10.1  9.4  HGB  9.3*  9.8*  10.0*  HCT  29.9*  31.0*  31.0*  PLT  352  361  333   Coag's No results found for this basename: APTT, INR,  in the last 72 hours BMET Recent Labs     03/02/13  0516  03/03/13  0820  03/04/13  0350  NA  146*  142  148*  K  3.7  3.6  3.3*  CL  96  94*  99  CO2  36*  37*  36*  BUN  42*  53*  59*  CREATININE  1.75*  1.65*  1.54*  GLUCOSE  343*  340*  302*   Electrolytes Recent Labs     03/02/13  0516  03/03/13  0820  03/04/13  0350  CALCIUM  9.5  9.3  8.7   Sepsis Markers No results found for this basename: LACTICACIDVEN, PROCALCITON, O2SATVEN,  in the last 72 hours ABG Recent Labs     03/02/13  0630  03/03/13  0534  03/04/13  0340  PHART  7.426  7.394  7.353  PCO2ART  55.0*  59.6*  70.2*  PO2ART  118.0*  119.0*  149.0*   Liver Enzymes No results found for this basename: AST, ALT, ALKPHOS, BILITOT, ALBUMIN,  in the last 72 hours Cardiac Enzymes No results found for this basename: TROPONINI, PROBNP,  in the last 72 hours Glucose Recent Labs     03/03/13  0729  03/03/13  1102  03/03/13  1637  03/03/13  1957  03/04/13  0041  03/04/13  0338  GLUCAP  321*  290*  255*  277*  293*  278*    Imaging Dg Chest Port 1 View  03/03/2013   CLINICAL DATA:  Respiratory failure  EXAM: PORTABLE CHEST - 1 VIEW  COMPARISON:  Portable exam 0746 hr compared to 02/28/2013  FINDINGS: Endotracheal tube and nasogastric tube stable.  Enlargement of cardiac silhouette with  pulmonary vascular congestion.  Improved aeration particularly at the bases versus previous exam.  No pneumothorax or acute osseous findings.  IMPRESSION: Improved aeration.   Electronically Signed   By: Ulyses Southward M.D.   On: 03/03/2013 08:28     CXR:  None today  ASSESSMENT / PLAN:  PULMONARY A: Acute on chronic hypercarbic respiratory failure Obesity hypoventilation syndrome Probable OSA Difficult airway P:   ENT consulted, planned trach 10/15 Scheduled BDs Daily SBT after trach tube placed > likely to ATC quickly DC steroids 10/15 Speech rx eval for cuff down and PMV  CARDIOVASCULAR A:  Acute on chronic diastolic congestive heart failure, neg 11.8 L for admission Paroxsymal SVT Symptomatic bradycardia s/p PPM placement, currently paced HTN P:  Hold diuresis currently Continue clonidine, hydralazine, diltiazem, coreg  RENAL A:   AKI, exacerbated by diuresis Compensatory elevation in HCO3 hypokalemia P:   KVO fluids Hold diuretics  Monitor BMET intermittently Correct electrolytes as indicated  GASTROINTESTINAL A:   Morbid obesity P:   SUP: IV famotidine Restart TF after procedure, assess for cuff diet and diet  HEMATOLOGIC A:   Normocytic anemia- likely of chronic disease P:  Trend CBC Continue iron  INFECTIOUS A:   No acute processes P:   Routine monitoring  ENDOCRINE A:   DM2 P:   Lantus + SSI DC IV steroids  NEUROLOGIC A:   No acute concerns P:   Rass goal of 0 PRN fentanyl  TODAY'S SUMMARY: Will continue vent on pressure support, ENT to trach, will dc steroids and monitor CBGs  Murtis Sink, MD [facility] Family Medicine Resident, PGY-2 03/04/2013, 7:20 AM  35 min CC time  Levy Pupa, MD, PhD 03/04/2013, 12:29 PM [facility] [phone] or if no answer [phone]

## 2013-03-04 NOTE — Progress Notes (Signed)
Inpatient Diabetes Program Recommendations  AACE/ADA: New Consensus Statement on Inpatient Glycemic Control (2013)  Target Ranges:  Prepandial:   less than 140 mg/dL      Peak postprandial:   less than 180 mg/dL (1-2 hours)      Critically ill patients:  140 - 180 mg/dL   Reason for Visit: Results for EYANNA, MCGONAGLE (MRN [id number]) as of 03/04/2013 10:24  Ref. Range 03/03/2013 16:37 03/03/2013 19:57 03/04/2013 00:41 03/04/2013 03:38 03/04/2013 07:17  Glucose-Capillary Latest Range: 70-99 mg/dL 409 (H) 811 (H) 914 (H) 278 (H) 254 (H)   CBG's remain difficult to control.  Recommend ICU Glycemic control protocol "IV insulin"-Phase 2, to help determine patient's insulin needs.    Thanks,  Beryl Meager, RN, BC-ADM Inpatient Diabetes Coordinator Pager [phone]

## 2013-03-04 NOTE — Brief Op Note (Signed)
02/24/2013 - 03/04/2013  10:31 AM  PATIENT:  Portia N Burnside  57 y.o. female  PRE-OPERATIVE DIAGNOSIS:  ventilator dependence, respiratory failure  POST-OPERATIVE DIAGNOSIS:  ventilator dependence, respiratory failure  PROCEDURE:  Procedure(s): TRACHEOSTOMY (N/A)  SURGEON:  Surgeon(s) and Role:    * Darletta Moll, MD - Primary  PHYSICIAN ASSISTANT:   ASSISTANTS: none   ANESTHESIA:   general  EBL:  Total I/O In: 10 [I.V.:10] Out: -   BLOOD ADMINISTERED:none  DRAINS: none   LOCAL MEDICATIONS USED:  LIDOCAINE   SPECIMEN:  No Specimen  DISPOSITION OF SPECIMEN:  N/A  COUNTS:  YES  TOURNIQUET:  * No tourniquets in log *  DICTATION: .Other Dictation: Dictation Number [id number]  PLAN OF CARE: Admit to inpatient   PATIENT DISPOSITION:  ICU - intubated and hemodynamically stable.   Delay start of Pharmacological VTE agent (>24hrs) due to surgical blood loss or risk of bleeding: not applicable

## 2013-03-04 NOTE — Progress Notes (Signed)
SLP Cancellation Note  Patient Details Name: [name] MRN: [id number] DOB: Oct 14, 1955   Cancelled treatment:        Received order for PMV.  RN reported trach inserted today around 11:00.  SLP will defer PMSV assessment until tomorrow to allow for possible edema/irritation to subside if present.  Will see 10/16   Royce Macadamia 03/04/2013, 1:56 PM

## 2013-03-04 NOTE — Progress Notes (Signed)
NUTRITION FOLLOW UP  Intervention:    Recommend decrease Glucerna 1.2 goal rate to 45 ml/h, add Prostat 30 ml TID to provide 1596 kcals (67% of estimated needs), 110 gm protein, 869 ml free water daily.  Nutrition Dx:   Inadequate oral intake related to inability to eat as evidenced by NPO status. Ongoing.  Goal:   Enteral nutrition to provide 60-70% of estimated calorie needs (22-25 kcals/kg ideal body weight) and 100% of estimated protein needs, based on ASPEN guidelines for permissive underfeeding in critically ill obese individuals.  Monitor:   TF tolerance/adequacy, weight trend, labs, vent status.  Assessment:   Patient admitted 10/7 to [facility] with hypercarbic failure due to OHS and CHF. Failed extubation 10/11 and difficult re-intubation. Transferred from [facility] to [facility] on 10/14. S/P tracheostomy today.   Patient is currently intubated on ventilator support.  MV: 7.3 L/min Temp:Temp (24hrs), Avg:98.4 F (36.9 C), Min:97.7 F (36.5 C), Max:99.2 F (37.3 C)  TF Order: Glucerna 1.2 at 55 ml/hr to provide 1584 kcals, 79 grams protein, and 1063 ml free water daily.  Height: Ht Readings from Last 1 Encounters:  03/03/13 5' (1.524 m)    Weight Status:   Wt Readings from Last 1 Encounters:  03/04/13 396 lb 13.3 oz (180 kg)  02/28/13  398 lb 2.4 oz (180.6 kg)    Body mass index is 77.5 kg/(m^2).   Re-estimated needs:  Kcal: 2400 Protein: 114 gm Fluid: 2.4 L  Skin: WNL  Diet Order: NPO   Intake/Output Summary (Last 24 hours) at 03/04/13 1554 Last data filed at 03/04/13 1500  Gross per 24 hour  Intake   1715 ml  Output   2280 ml  Net   -565 ml    Last BM: 10/6   Labs:   Recent Labs Lab 03/02/13 0516 03/03/13 0820 03/04/13 0350  NA 146* 142 148*  K 3.7 3.6 3.3*  CL 96 94* 99  CO2 36* 37* 36*  BUN 42* 53* 59*  CREATININE 1.75* 1.65* 1.54*  CALCIUM 9.5 9.3 8.7  GLUCOSE 343* 340* 302*    CBG (last 3)   Recent Labs  03/04/13 0717 03/04/13 1221  03/04/13 1533  GLUCAP 254* 234* 249*    Scheduled Meds: . albuterol  2.5 mg Nebulization Q4H  . antiseptic oral rinse  15 mL Mouth Rinse QID  . carvedilol  25 mg Oral BID WC  . chlorhexidine  15 mL Mouth Rinse BID  . cloNIDine  0.3 mg Oral TID  . diltiazem  30 mg Oral Q6H  . famotidine (PEPCID) IV  20 mg Intravenous Q12H  . ferrous sulfate  300 mg Per Tube BID WC  . hydrALAZINE  100 mg Oral Q6H  . insulin aspart  0-20 Units Subcutaneous Q4H  . insulin aspart  3 Units Subcutaneous Q4H  . insulin glargine  30 Units Subcutaneous Daily  . nystatin   Topical BID    Continuous Infusions: . sodium chloride 10 mL/hr at 03/04/13 1400  . feeding supplement (GLUCERNA 1.2 CAL) Stopped (03/04/13 0100)    Joaquin Courts, RD, LDN, CNSC Pager [phone] After Hours Pager [phone]

## 2013-03-04 NOTE — Progress Notes (Signed)
CRITICAL VALUE ALERT  Critical value received: PCo2 70  Date of notification:  03-04-13  Time of notification:  0358  Critical value read back:yes  Nurse who received alert:  Jamesetta So RN  MD notified (1st page):  Dr Bea Laura. Deterding  Time of first page:  [phone]  Responding MD:  Dr Bea Laura Deterding  Time MD responded: [phone]

## 2013-03-04 NOTE — Transfer of Care (Signed)
Immediate Anesthesia Transfer of Care Note  Patient: [name]  Procedure(s) Performed: Procedure(s): TRACHEOSTOMY (N/A)  Patient Location: PACU and ICU  Anesthesia Type:General  Level of Consciousness: sedated  Airway & Oxygen Therapy: Patient remains intubated per anesthesia plan  Post-op Assessment: Report given to PACU RN and Post -op Vital signs reviewed and stable  Post vital signs: Reviewed and stable  Complications: No apparent anesthesia complications

## 2013-03-04 NOTE — Preoperative (Signed)
Beta Blockers   Reason not to administer Beta Blockers:Not Applicable 

## 2013-03-04 NOTE — Anesthesia Preprocedure Evaluation (Signed)
Anesthesia Evaluation  Patient identified by MRN, date of birth, ID band Patient awake    Reviewed: Allergy & Precautions, H&P , NPO status , Patient's Chart, lab work & pertinent test results  Airway      Comment: Intubated on vent form ICU. [facility]   Pulmonary shortness of breath, asthma , sleep apnea , COPD         Cardiovascular hypertension, + CAD, + Past MI and + Peripheral Vascular Disease + dysrhythmias + pacemaker     Neuro/Psych    GI/Hepatic hiatal hernia, GERD-  ,  Endo/Other  diabetes  Renal/GU Renal disease     Musculoskeletal   Abdominal   Peds  Hematology   Anesthesia Other Findings   Reproductive/Obstetrics                           Anesthesia Physical Anesthesia Plan  ASA: IV  Anesthesia Plan: General   Post-op Pain Management:    Induction: Intravenous  Airway Management Planned: Oral ETT  Additional Equipment:   Intra-op Plan:   Post-operative Plan:   Informed Consent:   Plan Discussed with: CRNA, Anesthesiologist and Surgeon  Anesthesia Plan Comments:         Anesthesia Quick Evaluation

## 2013-03-04 NOTE — Anesthesia Postprocedure Evaluation (Signed)
  Anesthesia Post-op Note  Patient: [name]  Procedure(s) Performed: Procedure(s): TRACHEOSTOMY (N/A)  Patient Location: PACU  Anesthesia Type:General  Level of Consciousness: awake  Airway and Oxygen Therapy: Patient Spontanous Breathing  Post-op Pain: mild  Post-op Assessment: Post-op Vital signs reviewed  Post-op Vital Signs: Reviewed  Complications: No apparent anesthesia complications

## 2013-03-05 ENCOUNTER — Inpatient Hospital Stay (HOSPITAL_COMMUNITY): Payer: Medicaid Other

## 2013-03-05 LAB — CBC
HCT: 30.5 % — ABNORMAL LOW (ref 36.0–46.0)
Hemoglobin: 9.5 g/dL — ABNORMAL LOW (ref 12.0–15.0)
MCH: 28.1 pg (ref 26.0–34.0)
MCHC: 31.1 g/dL (ref 30.0–36.0)
MCV: 90.2 fL (ref 78.0–100.0)
Platelets: 330 10*3/uL (ref 150–400)
Platelets: 330 10*3/uL (ref 150–400)
RBC: 3.49 MIL/uL — ABNORMAL LOW (ref 3.87–5.11)
RBC: 3.38 MIL/uL — ABNORMAL LOW (ref 3.87–5.11)
RDW: 16.9 % — ABNORMAL HIGH (ref 11.5–15.5)
RDW: 17.3 % — ABNORMAL HIGH (ref 11.5–15.5)
WBC: 17 10*3/uL — ABNORMAL HIGH (ref 4.0–10.5)
WBC: 21 10*3/uL — ABNORMAL HIGH (ref 4.0–10.5)

## 2013-03-05 LAB — BLOOD GAS, ARTERIAL
Drawn by: 352351
FIO2: 0.35 %
O2 Saturation: 97.7 %
pCO2 arterial: 66.9 mmHg (ref 35.0–45.0)
pH, Arterial: 7.378 (ref 7.350–7.450)
pO2, Arterial: 106 mmHg — ABNORMAL HIGH (ref 80.0–100.0)

## 2013-03-05 LAB — BASIC METABOLIC PANEL
CO2: 35 mEq/L — ABNORMAL HIGH (ref 19–32)
Chloride: 103 mEq/L (ref 96–112)
GFR calc Af Amer: 44 mL/min — ABNORMAL LOW (ref >90)
Glucose, Bld: 278 mg/dL — ABNORMAL HIGH (ref 70–99)
Potassium: 3.8 mEq/L (ref 3.5–5.1)
Sodium: 150 mEq/L — ABNORMAL HIGH (ref 135–145)

## 2013-03-05 LAB — PROTIME-INR
INR: 1.1 (ref 0.00–1.49)
Prothrombin Time: 14 seconds (ref 11.6–15.2)

## 2013-03-05 LAB — GLUCOSE, CAPILLARY
Glucose-Capillary: 195 mg/dL — ABNORMAL HIGH (ref 70–99)
Glucose-Capillary: 219 mg/dL — ABNORMAL HIGH (ref 70–99)
Glucose-Capillary: 225 mg/dL — ABNORMAL HIGH (ref 70–99)
Glucose-Capillary: 231 mg/dL — ABNORMAL HIGH (ref 70–99)
Glucose-Capillary: 254 mg/dL — ABNORMAL HIGH (ref 70–99)

## 2013-03-05 MED ORDER — GLUCERNA 1.2 CAL PO LIQD
1000.0000 mL | ORAL | Status: DC
  Administered 2013-03-06 – 2013-03-19 (×8): 1000 mL
  Filled 2013-03-05 (×26): qty 1000

## 2013-03-05 MED ORDER — FAMOTIDINE 40 MG/5ML PO SUSR
20.0000 mg | Freq: Two times a day (BID) | ORAL | Status: DC
  Administered 2013-03-05 – 2013-03-24 (×38): 20 mg
  Filled 2013-03-05 (×39): qty 2.5

## 2013-03-05 MED ORDER — PRO-STAT SUGAR FREE PO LIQD
30.0000 mL | Freq: Three times a day (TID) | ORAL | Status: DC
  Administered 2013-03-05 – 2013-03-25 (×61): 30 mL
  Filled 2013-03-05 (×63): qty 30

## 2013-03-05 MED ORDER — FAMOTIDINE 40 MG/5ML PO SUSR
20.0000 mg | Freq: Two times a day (BID) | ORAL | Status: DC
  Filled 2013-03-05 (×2): qty 2.5

## 2013-03-05 MED ORDER — LORAZEPAM 2 MG/ML IJ SOLN
0.5000 mg | Freq: Three times a day (TID) | INTRAMUSCULAR | Status: DC | PRN
  Administered 2013-03-05 – 2013-03-18 (×14): 0.5 mg via INTRAVENOUS
  Filled 2013-03-05 (×14): qty 1

## 2013-03-05 MED ORDER — FREE WATER
200.0000 mL | Freq: Four times a day (QID) | Status: DC
  Administered 2013-03-05 – 2013-03-10 (×21): 200 mL

## 2013-03-05 NOTE — Op Note (Signed)
NAMEDLISA, [name]              ACCOUNT NO.:  [id number]  MEDICAL RECORD NO.:  [id number]  LOCATION:                               FACILITY:  [facility]  PHYSICIAN:  Newman Pies, MD            DATE OF BIRTH:  [birth year]  DATE OF PROCEDURE:  03/04/2013 DATE OF DISCHARGE:  02/24/2013                              OPERATIVE REPORT   PREOPERATIVE DIAGNOSES: 1. Chronic respiratory failure. 2. Obstructive sleep disorder. 3. Ventilator dependence. 4. Morbid obesity.  POSTOPERATIVE DIAGNOSES: 1. Chronic respiratory failure. 2. Obstructive sleep disorder. 3. Ventilator dependence. 4. Morbid obesity.  PROCEDURE PERFORMED:  Tracheostomy tube placement.  ANESTHESIA:  General endotracheal tube anesthesia.  COMPLICATIONS:  None.  ESTIMATED BLOOD LOSS:  Minimal.  INDICATION FOR PROCEDURE:  The patient is a 57 year old female with a history of obstructive sleep apnea, morbid obesity, and coronary artery disease.  She was admitted to the [facility] last week with acute respiratory failure.  She was intubated.  Multiple attempts to wean her from the ventilator was unsuccessful.  She was extubated once, but was emergently reintubated due to her respiratory failure.  The patient was subsequently transferred to the [facility] for further evaluation and treatment.  Based on the above findings, ENT was consulted for tracheostomy tube placement.  The risks, benefits, alternatives, and details of the procedure were discussed with the patient and her family.  Questions were invited and answered.  Informed consent was obtained.  DESCRIPTION OF PROCEDURE:  The patient was taken to the operating room and placed supine on the operating table.  General anesthesia was administered via the preexisting endotracheal tube.  The patient was positioned and prepped and draped in a standard fashion for tracheostomy tube placement.  Lidocaine 1% with 1:100,000 epinephrine was injected at the  anterior neck.  A vertical incision was made at the level of the cricoid.  The incision was carried down to the level of the strap muscles.  The strap muscles were divided at midline and retracted laterally.  The thyroid isthmus was identified.  The thyroid isthmus was divided at midline.  The anterior tracheal wall was subsequently identified.  The second tracheal ring was then opened.  Tracheal window was created.  The endotracheal tube was withdrawn.  A #6 XLT cuffed Shiley tracheostomy tube was placed.  Good CO2 and end tidal returned were noted.  The tracheostomy tube was secured in place with 2-0 Prolene sutures and circumferential neck tie.  The care of the patient was turned over to the anesthesiologist.  The patient was awakened from anesthesia without difficulty.  She was extubated and transferred to the recovery room in good condition.  OPERATIVE FINDINGS:  A #6 cuffed Shiley XLT tracheostomy tube was placed.  SPECIMEN:  None.  FOLLOWUP CARE:  The patient will be returned to the ICU.  Should be weaned from her ventilator support.     Newman Pies, MD   ______________________________ Newman Pies, MD    ST/MEDQ  D:  03/04/2013  T:  03/05/2013  Job:  [id number]  cc:   Ramon Dredge L. Juanetta Gosling, M.D.

## 2013-03-05 NOTE — Progress Notes (Signed)
Subjective: Some bleeding from trach site last night.  Controlled with packing. Pt on trach collar, off vent.  Objective: Vital signs in last 24 hours: Temp:  [97.7 F (36.5 C)-99.1 F (37.3 C)] 99 F (37.2 C) (10/16 0400) Pulse Rate:  [56-70] 59 (10/16 0700) Resp:  [9-30] 20 (10/16 0700) BP: (136-198)/(77-138) 167/90 mmHg (10/16 0700) SpO2:  [80 %-100 %] 99 % (10/16 0700) FiO2 (%):  [35 %-40 %] 35 % (10/16 0437) Weight:  [175 kg (385 lb 12.9 oz)-180 kg (396 lb 13.3 oz)] 175 kg (385 lb 12.9 oz) (10/16 0456)  Pt awake and responsive.  NAD. No acute bleeding is noted.  A new neck tie is placed. Good air movement via trach.   Recent Labs  03/04/13 0350 03/05/13 0001  WBC 9.4 17.0*  HGB 10.0* 9.9*  HCT 31.0* 31.2*  PLT 333 330    Recent Labs  03/04/13 0350 03/05/13 0001  NA 148* 150*  K 3.3* 3.8  CL 99 103  CO2 36* 35*  GLUCOSE 302* 278*  BUN 59* 62*  CREATININE 1.54* 1.48*  CALCIUM 8.7 8.6    Medications:  I have reviewed the patient's current medications. Scheduled: . albuterol  2.5 mg Nebulization Q4H  . antiseptic oral rinse  15 mL Mouth Rinse QID  . carvedilol  25 mg Oral BID WC  . chlorhexidine  15 mL Mouth Rinse BID  . cloNIDine  0.3 mg Oral TID  . diltiazem  30 mg Oral Q6H  . famotidine (PEPCID) IV  20 mg Intravenous Q12H  . ferrous sulfate  300 mg Per Tube BID WC  . hydrALAZINE  100 mg Oral Q6H  . insulin aspart  0-20 Units Subcutaneous Q4H  . insulin aspart  3 Units Subcutaneous Q4H  . insulin glargine  30 Units Subcutaneous Daily  . nystatin   Topical BID   ZOX:[id number], acetaminophen, fentaNYL, ondansetron (ZOFRAN) IV, ondansetron, promethazine  Assessment/Plan: POD#1 s/p trach. Off vent, on trach collar.  No bleeding at this time.  Routine trach care.  Will change trach around POD #10.   LOS: 9 days   Kamarie Palma,SUI W 03/05/2013, 8:05 AM

## 2013-03-05 NOTE — Progress Notes (Signed)
NUTRITION FOLLOW UP  Intervention:    Decrease Glucerna 1.2 goal rate to 50 ml/h, add Prostat 30 ml TID to provide 1740 kcals (92% of minimum estimated needs), 117 gm protein, 966 ml free water daily.  Nutrition Dx:   Inadequate oral intake related to inability to eat as evidenced by NPO status. Ongoing.  New Goal:   Intake to meet >90% of estimated nutrition needs.  Monitor:   TF tolerance/adequacy, weight trend, labs, vent status.  Assessment:   Patient admitted 10/7 to [facility] with hypercarbic failure due to OHS and CHF. Failed extubation 10/11 and difficult re-intubation. Transferred from [facility] on 10/14. S/P tracheostomy on 10/15.   Patient is currently on trach collar. Not receiving vent support at this time. SLP following for swallow evaluation; unable to complete this morning due to significant bleeding from trach site. Discussed patient in ICU rounds today; RD to adjust TF rate as needed to meet nutrition needs.  TF Order: Glucerna 1.2 at 55 ml/hr providing 1584 kcals, 79 grams protein, and 1063 ml free water daily.  Height: Ht Readings from Last 1 Encounters:  03/03/13 5' (1.524 m)    Weight Status:  Decreasing with negative fluid status. Wt Readings from Last 1 Encounters:  03/05/13 385 lb 12.9 oz (175 kg)  03/04/13  396 lb 13.3 oz (180 kg)  02/28/13  398 lb 2.4 oz (180.6 kg)    Body mass index is 75.35 kg/(m^2).   Re-estimated needs:  Kcal: 1900-2100 Protein: 114 gm Fluid: 2-2.4 L  Skin: WNL  Diet Order: NPO   Intake/Output Summary (Last 24 hours) at 03/05/13 1108 Last data filed at 03/05/13 1000  Gross per 24 hour  Intake 1407.5 ml  Output   2405 ml  Net -997.5 ml    Last BM: 10/6   Labs:   Recent Labs Lab 03/03/13 0820 03/04/13 0350 03/05/13 0001  NA 142 148* 150*  K 3.6 3.3* 3.8  CL 94* 99 103  CO2 37* 36* 35*  BUN 53* 59* 62*  CREATININE 1.65* 1.54* 1.48*  CALCIUM 9.3 8.7 8.6  GLUCOSE 340* 302* 278*    CBG (last 3)   Recent  Labs  03/05/13 0023 03/05/13 0359 03/05/13 0751  GLUCAP 254* 231* 219*    Scheduled Meds: . albuterol  2.5 mg Nebulization Q4H  . antiseptic oral rinse  15 mL Mouth Rinse QID  . carvedilol  25 mg Oral BID WC  . chlorhexidine  15 mL Mouth Rinse BID  . cloNIDine  0.3 mg Oral TID  . diltiazem  30 mg Oral Q6H  . famotidine  20 mg Per Tube BID  . ferrous sulfate  300 mg Per Tube BID WC  . free water  200 mL Per Tube Q6H  . hydrALAZINE  100 mg Oral Q6H  . insulin aspart  0-20 Units Subcutaneous Q4H  . insulin aspart  3 Units Subcutaneous Q4H  . insulin glargine  30 Units Subcutaneous Daily  . nystatin   Topical BID    Continuous Infusions: . sodium chloride 10 mL/hr at 03/04/13 1400  . feeding supplement (GLUCERNA 1.2 CAL) 1,000 mL (03/05/13 0700)    Joaquin Courts, RD, LDN, CNSC Pager [phone] After Hours Pager [phone]

## 2013-03-05 NOTE — Progress Notes (Signed)
Interim progress note  Notified by RN of oozing from suture sites on trach collar over the last 1-2 hours. Soaked 4X4 in 10-15 minutes.  Will Check PT/INR, place thrombi-pad If needs further management will contact ENT for eval or recomendations  Murtis Sink, MD [facility] Family Medicine Resident, PGY-2 03/05/2013, 12:16 AM

## 2013-03-05 NOTE — Progress Notes (Signed)
PULMONARY  / CRITICAL CARE MEDICINE  Name: [name] MRN: [id number] DOB: 1956/04/20    ADMISSION DATE:  02/24/2013 CONSULTATION DATE:  03/03/2013  REFERRING MD :  Kari Baars PRIMARY SERVICE: PCCM  CHIEF COMPLAINT:  VDRF due to OHS, pulm edema  BRIEF PATIENT DESCRIPTION:  57 y/o female admitted 10/07 to [facility] with hypercarbic failure due to OHS and CHF. Failed extubation 10/11 and difficult re-intubation (6.0 ETT). Transferred to [facility] ICU 10/14 for further eval and mgmt  SIGNIFICANT EVENTS / STUDIES:  10/7 Admitted for hypercarbic resp failure, OHS, CHF.  Intubated 10/9 transient SVT controlled with IV cardizem, transitioned to PO cardizem and coreg 10/11 pt extubated and re-intubated, steroids, difficult re-intubation used # 6 tube 10/14 transferred to [facility] ICU, likely needs trach 10/15 tracheostomy tube placed By ENT  LINES / TUBES: ETT 10/07 >> 10/11, 10/11>>>10/15 Tracheostomy 10/15>>> Foley 10/8>>>  CULTURES: MRSA PCR 10/7 >> NEG  ANTIBIOTICS: None  SUBJECTIVE:  Some oozing overnight around trach collar, unresponsive to thrombipad, packed with gauze and has slowed.     VITAL SIGNS: Temp:  [97.7 F (36.5 C)-99.1 F (37.3 C)] 99 F (37.2 C) (10/16 0400) Pulse Rate:  [56-70] 59 (10/16 0700) Resp:  [9-30] 20 (10/16 0700) BP: (136-198)/(77-138) 167/90 mmHg (10/16 0700) SpO2:  [80 %-100 %] 99 % (10/16 0700) FiO2 (%):  [35 %-40 %] 35 % (10/16 0437) Weight:  [385 lb 12.9 oz (175 kg)-396 lb 13.3 oz (180 kg)] 385 lb 12.9 oz (175 kg) (10/16 0456) HEMODYNAMICS:   VENTILATOR SETTINGS: Vent Mode:  [-]  FiO2 (%):  [35 %-40 %] 35 % INTAKE / OUTPUT: Intake/Output     10/15 0701 - 10/16 0700 10/16 0701 - 10/17 0700   P.O.     I.V. (mL/kg) 830 (4.7)    NG/GT 797.5    IV Piggyback 50    Total Intake(mL/kg) 1677.5 (9.6)    Urine (mL/kg/hr) 2330 (0.6)    Total Output 2330     Net -652.5            PHYSICAL EXAMINATION: Gen: morbidly obese, lying in bed,  comfortable on trach collar HEENT: NCAT, EOMI, MMM CV: RRR, paced on tele monitor,  good S1/S2, no murmur Resp: CTABL, no wheezes, good air movement Abd: SNTND, BS present, no guarding or organomegaly Ext: chronic venous stasis changes on BL LE, trace pitting edema BL Neuro: Alert, Rass 0, no focal deficits  LABS:  CBC Recent Labs     03/03/13  0820  03/04/13  0350  03/05/13  0001  WBC  10.1  9.4  17.0*  HGB  9.8*  10.0*  9.9*  HCT  31.0*  31.0*  31.2*  PLT  361  333  330   Coag's Recent Labs    03/05/13  INR  1.10   BMET Recent Labs     03/03/13  0820  03/04/13  0350  03/05/13  0001  NA  142  148*  150*  K  3.6  3.3*  3.8  CL  94*  99  103  CO2  37*  36*  35*  BUN  53*  59*  62*  CREATININE  1.65*  1.54*  1.48*  GLUCOSE  340*  302*  278*   Electrolytes Recent Labs     03/03/13  0820  03/04/13  0350  03/05/13  0001  CALCIUM  9.3  8.7  8.6   Sepsis Markers No results found for this basename: LACTICACIDVEN, PROCALCITON, O2SATVEN,  in the last 72 hours ABG Recent Labs     03/03/13  0534  03/04/13  0340  03/05/13  0523  PHART  7.394  7.353  7.378  PCO2ART  59.6*  70.2*  66.9*  PO2ART  119.0*  149.0*  106.0*   Liver Enzymes No results found for this basename: AST, ALT, ALKPHOS, BILITOT, ALBUMIN,  in the last 72 hours Cardiac Enzymes No results found for this basename: TROPONINI, PROBNP,  in the last 72 hours Glucose Recent Labs     03/04/13  0717  03/04/13  1221  03/04/13  1533  03/04/13  1956  03/05/13  0023  03/05/13  0359  GLUCAP  254*  234*  249*  257*  254*  231*    Imaging Dg Chest Port 1 View  03/05/2013   CLINICAL DATA:  Respiratory failure.  EXAM: PORTABLE CHEST - 1 VIEW  COMPARISON:  03/03/2013.  FINDINGS: Tracheostomy tube tip 4.2 cm above the carina.  Sequential pacemaker enters from the left with leads unchanged in position.  Cardiomegaly.  Pulmonary vascular congestion/ pulmonary edema.  Lucency adjacent to the at right heart  border and mediastinum may represent overlying structures however, pneumomediastinum or the anteriorly located right-sided pneumothorax not entirely excluded. Attention to this on follow up.  Tortuous aorta.  Nasogastric tube is in place with the tip not imaged.  IMPRESSION: Cardiomegaly.  Pulmonary vascular congestion/ pulmonary edema.  Lucency adjacent to the at right heart border and mediastinum may represent overlying structures however, pneumomediastinum or the anteriorly located right-sided pneumothorax not entirely excluded. Attention to this on follow up.  This is a call report.   Electronically Signed   By: Bridgett Larsson M.D.   On: 03/05/2013 07:40   Dg Abd Portable 1v  03/04/2013   CLINICAL DATA:  Feeding tube placement.  EXAM: PORTABLE ABDOMEN - 1 VIEW  COMPARISON:  02/24/2013  FINDINGS: A feeding tube tip has been inserted and lies with its tip along the greater curvature of the stomach near the pylorus. Gas pattern is nonspecific.  IMPRESSION: Feeding tube tip distal stomach.   Electronically Signed   By: Davonna Belling M.D.   On: 03/04/2013 17:18     CXR:  None today  ASSESSMENT / PLAN:  PULMONARY A: Acute on chronic hypercarbic respiratory failure, s/p tracheostomy Obesity hypoventilation syndrome OSA (+ possible central SA) Difficult airway P:   ABG reviewed, continue trach collar. She may require home vent if she has significant central apneas. Hopefully she can get by with trach collar only.   Scheduled BDs  CARDIOVASCULAR A:  Acute on chronic diastolic congestive heart failure, neg 12.3 L for admission Paroxsymal SVT Symptomatic bradycardia s/p PPM placement, currently paced HTN P:  Hold diuresis currently, see below Continue clonidine, hydralazine, diltiazem, coreg  RENAL A:   AKI, exacerbated by diuresis Compensatory elevation in HCO3 Hypokalemia- resolved hypernatremia P:   Hold diuretics for now, likely restart demadex at lower dose on 10/17 Monitor BMET  intermittently Correct electrolytes as indicated Free water flushes, 200 Q6  GASTROINTESTINAL A:   Morbid obesity P:   SUP: IV famotidine Restart TF, assess for cuff diet and diet  HEMATOLOGIC A:   Normocytic anemia- likely of chronic disease + losses from trach site P:  Trend CBC Continue iron  INFECTIOUS A:   No acute processes P:   Routine monitoring  ENDOCRINE A:   DM2 P:   Lantus + SSI DC IV steroids  NEUROLOGIC A:   No acute  concerns P:   Rass goal of 0 PRN fentanyl  TODAY'S SUMMARY: Will continue trach collar and assess for cuff and diet today. Monitor CBGs for improvement with Lantus starting today. Transfer to SDU, stay on PCCM service.    Murtis Sink, MD [facility] Family Medicine Resident, PGY-2 03/05/2013, 8:10 AM   Levy Pupa, MD, PhD 03/05/2013, 10:04 AM [facility] [phone] or if no answer [phone]

## 2013-03-05 NOTE — Progress Notes (Signed)
Radiology called about 0449 chest x-ray results. Notified Dr. Delton Coombes about results.

## 2013-03-05 NOTE — Progress Notes (Signed)
Chaplain visited pt and husband, offering emotional and spiritual support. Pt was able to communicate and asked for prayers for health. Pt's husband said they have been married 42 years and have 14 grandchildren. 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   Chaplain provided prayer, emotional/spiritual support, and empathic listening. Pt and husband were appreciative of visit.   Maurene Capes, [place] [phone]

## 2013-03-05 NOTE — Progress Notes (Signed)
SLP Cancellation Note  Patient Details Name: [name] MRN: [id number] DOB: [birth year]   Cancelled treatment:       Reason Eval/Treat Not Completed: Medical issues which prohibited therapy.  Patient continues to present with significant bleeding from trach site. Discussed with RN. Decision made to hold evaluation. Will f/u 10/17.   Ferdinand Lango MA, CCC-SLP [phone]    [name] 03/05/2013, 9:40 AM

## 2013-03-06 LAB — CBC
HCT: 27.8 % — ABNORMAL LOW (ref 36.0–46.0)
HCT: 28.8 % — ABNORMAL LOW (ref 36.0–46.0)
Hemoglobin: 9 g/dL — ABNORMAL LOW (ref 12.0–15.0)
MCH: 28 pg (ref 26.0–34.0)
MCHC: 31.7 g/dL (ref 30.0–36.0)
MCV: 89.7 fL (ref 78.0–100.0)
MCV: 89.4 fL (ref 78.0–100.0)
Platelets: 315 10*3/uL (ref 150–400)
Platelets: 315 10*3/uL (ref 150–400)
RBC: 3.1 MIL/uL — ABNORMAL LOW (ref 3.87–5.11)
RBC: 3.22 MIL/uL — ABNORMAL LOW (ref 3.87–5.11)
RDW: 17.5 % — ABNORMAL HIGH (ref 11.5–15.5)
WBC: 23.7 10*3/uL — ABNORMAL HIGH (ref 4.0–10.5)

## 2013-03-06 LAB — POCT I-STAT 3, ART BLOOD GAS (G3+)
Acid-Base Excess: 15 mmol/L — ABNORMAL HIGH (ref 0.0–2.0)
Bicarbonate: 41.4 mEq/L — ABNORMAL HIGH (ref 20.0–24.0)
O2 Saturation: 89 %
TCO2: 43 mmol/L (ref 0–100)
pCO2 arterial: 65 mmHg (ref 35.0–45.0)
pO2, Arterial: 60 mmHg — ABNORMAL LOW (ref 80.0–100.0)

## 2013-03-06 LAB — GLUCOSE, CAPILLARY
Glucose-Capillary: 241 mg/dL — ABNORMAL HIGH (ref 70–99)
Glucose-Capillary: 243 mg/dL — ABNORMAL HIGH (ref 70–99)
Glucose-Capillary: 180 mg/dL — ABNORMAL HIGH (ref 70–99)
Glucose-Capillary: 198 mg/dL — ABNORMAL HIGH (ref 70–99)

## 2013-03-06 LAB — BASIC METABOLIC PANEL
BUN: 83 mg/dL — ABNORMAL HIGH (ref 6–23)
Creatinine, Ser: 1.94 mg/dL — ABNORMAL HIGH (ref 0.50–1.10)
GFR calc Af Amer: 32 mL/min — ABNORMAL LOW (ref >90)
GFR calc non Af Amer: 28 mL/min — ABNORMAL LOW (ref >90)

## 2013-03-06 MED ORDER — INSULIN GLARGINE 100 UNIT/ML ~~LOC~~ SOLN
40.0000 [IU] | Freq: Every day | SUBCUTANEOUS | Status: DC
  Administered 2013-03-06 – 2013-03-27 (×22): 40 [IU] via SUBCUTANEOUS
  Filled 2013-03-06 (×23): qty 0.4

## 2013-03-06 MED ORDER — BIOTENE DRY MOUTH MT LIQD
15.0000 mL | Freq: Two times a day (BID) | OROMUCOSAL | Status: DC
  Administered 2013-03-06 – 2013-03-27 (×43): 15 mL via OROMUCOSAL

## 2013-03-06 MED ORDER — KETOROLAC TROMETHAMINE 15 MG/ML IJ SOLN
15.0000 mg | Freq: Once | INTRAMUSCULAR | Status: AC
  Administered 2013-03-06: 15 mg via INTRAVENOUS
  Filled 2013-03-06: qty 1

## 2013-03-06 MED ORDER — POTASSIUM CHLORIDE 20 MEQ/15ML (10%) PO LIQD
ORAL | Status: AC
  Filled 2013-03-06: qty 30

## 2013-03-06 MED ORDER — POTASSIUM CHLORIDE 20 MEQ/15ML (10%) PO LIQD: 40.0000 meq | Freq: Every day | ORAL | Status: DC

## 2013-03-06 MED ORDER — POTASSIUM CHLORIDE 20 MEQ/15ML (10%) PO LIQD
40.0000 meq | Freq: Once | ORAL | Status: AC
  Administered 2013-03-06: 40 meq
  Filled 2013-03-06: qty 30

## 2013-03-06 MED ORDER — CHLORHEXIDINE GLUCONATE 0.12 % MT SOLN
15.0000 mL | Freq: Two times a day (BID) | OROMUCOSAL | Status: DC
  Administered 2013-03-06 – 2013-03-27 (×43): 15 mL via OROMUCOSAL
  Filled 2013-03-06 (×46): qty 15

## 2013-03-06 NOTE — Progress Notes (Signed)
Subjective: No more trach bleeding last night.  Off vent.  On trach collar.  Objective: Vital signs in last 24 hours: Temp:  [97.6 F (36.4 C)-99.2 F (37.3 C)] 97.7 F (36.5 C) (10/17 0827) Pulse Rate:  [63-86] 81 (10/17 0801) Resp:  [17-29] 17 (10/17 0801) BP: (113-148)/(50-104) 144/55 mmHg (10/17 0801) SpO2:  [87 %-100 %] 100 % (10/17 0801) FiO2 (%):  [28 %-35 %] 28 % (10/17 0801) Weight:  [169.6 kg (373 lb 14.4 oz)] 169.6 kg (373 lb 14.4 oz) (10/17 0500)  Pt awake and responsive. NAD. Morbidly obese. No acute bleeding is noted.  The trach stoma was packed with surgicel yesterday. Good air movement via trach.   Recent Labs  03/05/13 1551 03/06/13 0406  WBC 21.0* 23.7*  HGB 9.5* 8.8*  HCT 30.5* 27.8*  PLT 330 315    Recent Labs  03/05/13 0001 03/06/13 0406  NA 150* 150*  K 3.8 3.3*  CL 103 103  CO2 35* 38*  GLUCOSE 278* 229*  BUN 62* 83*  CREATININE 1.48* 1.94*  CALCIUM 8.6 8.5    Medications:  I have reviewed the patient's current medications. Scheduled: . albuterol  2.5 mg Nebulization Q4H  . antiseptic oral rinse  15 mL Mouth Rinse q12n4p  . carvedilol  25 mg Oral BID WC  . chlorhexidine  15 mL Mouth Rinse BID  . cloNIDine  0.3 mg Oral TID  . diltiazem  30 mg Oral Q6H  . famotidine  20 mg Per Tube BID  . feeding supplement (PRO-STAT SUGAR FREE 64)  30 mL Per Tube TID  . ferrous sulfate  300 mg Per Tube BID WC  . free water  200 mL Per Tube Q6H  . hydrALAZINE  100 mg Oral Q6H  . insulin aspart  0-20 Units Subcutaneous Q4H  . insulin aspart  3 Units Subcutaneous Q4H  . insulin glargine  40 Units Subcutaneous Daily  . nystatin   Topical BID   AVW:[id number], acetaminophen, LORazepam, ondansetron (ZOFRAN) IV, ondansetron, promethazine  Assessment/Plan: POD#2 s/p trach. Off vent, on trach collar. No bleeding at this time. Routine trach care. Will change trach around POD #10.   LOS: 10 days   Yasuko Lapage,SUI W 03/06/2013, 11:50 AM

## 2013-03-06 NOTE — Progress Notes (Signed)
Chaplain followed up with pt and family.   Pt was sleeping. Chaplain visited with pt's daughter, Nelida Gores, and pt's sister. Daughter said pt "was much better, but in pain from breathing tube." Family expressed having strong support and said they "were fine."  Chaplain offered emotional/spiritual support and empathic listening.   Chaplain available for follow up, as needed.   Maurene Capes, [place] [phone]

## 2013-03-06 NOTE — Evaluation (Signed)
Passy-Muir Speaking Valve - Evaluation Patient Details  Name: [name] MRN: [id number] Date of Birth: Jul 22, 1955  Today's Date: 03/06/2013 Time: 4098-1191 SLP Time Calculation (min): 20 min  Past Medical History:  Past Medical History  Diagnosis Date  . Morbid obesity   . Essential hypertension, benign   . Chronic diastolic heart failure     LVEF 60-65% 2011  . COPD (chronic obstructive pulmonary disease)     Oxygen dependent  . History of cardiac catheterization     Normal coronary arteries 2008  . Obesity hypoventilation syndrome   . Arthritis   . Hiatal hernia   . Symptomatic bradycardia     With pauses 2008 - Followed by Dr. Graciela Husbands  . Cellulitis     Recurrent bilateral LE  . Mixed hyperlipidemia   . Peripheral vascular disease   . Myocardial infarction 1990's  . Sleep apnea   . Type II diabetes mellitus   . Gout   . Nonsustained ventricular tachycardia     Documented 2008  . SVT (supraventricular tachycardia)     Reported possible atrial arrhythmias   Past Surgical History:  Past Surgical History  Procedure Laterality Date  . Abdominal hysterectomy  1980's  . Insert / replace / remove pacemaker  2010   HPI:  57 y/o female admitted 10/07 to [facility] with hypercarbic failure due to OHS and CHF. Failed extubation 10/11 and difficult re-intubation (6.0 ETT). Transferred to [facility] ICU 10/14 for further eval and mgmt.  S/p tracheostomy 10/15 due to inability to wean from vent.  Now tolerating trach collar.     Assessment / Plan / Recommendation Clinical Impression  Pt evaluated for toleration of PMSV.  RN present and completed tracheal suctioning; cuff deflated.  Pt able to redirect air through upper airway and produce weak phonation with gloved finger occlusion.  PMSV placed and pt tolerated for three 3-5 minute intervals.  Sp02 remained >98%; VS stable.  Pt reported no interference with breathing, but tightness/pressure in throat so valve was removed.  With repeated  use of valve, quality of phonation and volume began to improve.  Upon end of session, valve removed and cuff inflated.  Recommend use of PMSV only with full supervision from staff at this time,      SLP Assessment  Patient needs continued Speech Language Pathology Services    Follow Up Recommendations  Other  tba   Frequency and Duration min 3x week  2 weeks        SLP Goals Potential to Achieve Goals: Good   PMSV Trial  PMSV was placed for: intermittently for 3-5 minute intervals Able to redirect subglottic air through upper airway: Yes Able to Attain Phonation: Yes Voice Quality: Breathy;Hoarse;Low vocal intensity Able to Expectorate Secretions: Yes Level of Secretion Expectoration with PMSV: Oral Breath Support for Phonation: Moderately decreased Intelligibility: Intelligible Respirations During Trial: 18 SpO2 During Trial: 98 % Pulse During Trial: 75 Behavior: Alert;Controlled;Cooperative;Expresses self well   Tracheostomy Tube    #6 Shiley with cuf   Vent Dependency  FiO2 (%): 28 %    Cuff Deflation Trial       Negan Grudzien L. [place], [place] CCC/SLP Pager [phone]  Tolerated Cuff Deflation: Yes Length of Time for Cuff Deflation Trial: 15 min Behavior: Alert;Controlled;Expresses self well;Good eye contact Cuff Deflation Trial - Comments: Sp02 remained >98%; RR 18 ; HR 75       [name] 03/06/2013, 3:44 PM

## 2013-03-06 NOTE — Progress Notes (Signed)
PULMONARY  / CRITICAL CARE MEDICINE  Name: [name] MRN: [id number] DOB: [birth year]    ADMISSION DATE:  02/24/2013 CONSULTATION DATE:  03/03/2013  REFERRING MD :  Kari Baars PRIMARY SERVICE: PCCM  CHIEF COMPLAINT:  VDRF due to OHS, pulm edema  BRIEF PATIENT DESCRIPTION:  57 y/o female admitted 10/07 to [facility] with hypercarbic failure due to OHS and CHF. Failed extubation 10/11 and difficult re-intubation (6.0 ETT). Transferred to [facility] ICU 10/14 for further eval and mgmt  SIGNIFICANT EVENTS / STUDIES:  10/7 Admitted for hypercarbic resp failure, OHS, CHF.  Intubated 10/9 transient SVT controlled with IV cardizem, transitioned to PO cardizem and coreg 10/11 pt extubated and re-intubated, steroids, difficult re-intubation used # 6 tube 10/14 transferred to [facility] ICU, likely needs trach 10/15 tracheostomy tube placed By ENT  LINES / TUBES: ETT 10/07 >> 10/11, 10/11>>>10/15 Tracheostomy 10/15>>> Foley 10/8>>>  CULTURES: MRSA PCR 10/7 >> NEG  ANTIBIOTICS: None  SUBJECTIVE:  No acute events, oozing from trach site seems to have slowed/stopperd. Pt without pain or resp distress.   VITAL SIGNS: Temp:  [97.6 F (36.4 C)-99.2 F (37.3 C)] 98.6 F (37 C) (10/17 0344) Pulse Rate:  [60-86] 77 (10/17 0700) Resp:  [17-29] 23 (10/17 0700) BP: (113-170)/(50-104) 140/104 mmHg (10/17 0700) SpO2:  [87 %-100 %] 100 % (10/17 0700) FiO2 (%):  [28 %-35 %] 28 % (10/17 0306) Weight:  [373 lb 14.4 oz (169.6 kg)] 373 lb 14.4 oz (169.6 kg) (10/17 0500) HEMODYNAMICS:   VENTILATOR SETTINGS: Vent Mode:  [-]  FiO2 (%):  [28 %-35 %] 28 % INTAKE / OUTPUT: Intake/Output     10/16 0701 - 10/17 0700 10/17 0701 - 10/18 0700   I.V. (mL/kg) 235 (1.4)    NG/GT 2080    IV Piggyback 50    Total Intake(mL/kg) 2365 (13.9)    Urine (mL/kg/hr) 1165 (0.3)    Total Output 1165     Net +1200            PHYSICAL EXAMINATION: Gen: morbidly obese, lying in bed, comfortable on trach collar HEENT:  NCAT, EOMI, MMM CV: RRR, paced on tele monitor,  good S1/S2, no murmur Resp: CTABL, no wheezes, good air movement Abd: SNTND, BS present, no guarding or organomegaly Ext: chronic venous stasis changes on BL LE, 1+ pitting edema BL Neuro: Alert, Rass 0, no focal deficits  LABS:  CBC Recent Labs     03/05/13  0001  03/05/13  1551  03/06/13  0406  WBC  17.0*  21.0*  23.7*  HGB  9.9*  9.5*  8.8*  HCT  31.2*  30.5*  27.8*  PLT  330  330  315   Coag's Recent Labs    03/05/13  INR  1.10   BMET Recent Labs     03/04/13  0350  03/05/13  0001  03/06/13  0406  NA  148*  150*  150*  K  3.3*  3.8  3.3*  CL  99  103  103  CO2  36*  35*  38*  BUN  59*  62*  83*  CREATININE  1.54*  1.48*  1.94*  GLUCOSE  302*  278*  229*   Electrolytes Recent Labs     03/04/13  0350  03/05/13  0001  03/06/13  0406  CALCIUM  8.7  8.6  8.5   Sepsis Markers No results found for this basename: LACTICACIDVEN, PROCALCITON, O2SATVEN,  in the last 72 hours ABG Recent Labs  03/04/13  0340  03/05/13  0523  03/06/13  0339  PHART  7.353  7.378  7.412  PCO2ART  70.2*  66.9*  65.0*  PO2ART  149.0*  106.0*  60.0*   Liver Enzymes No results found for this basename: AST, ALT, ALKPHOS, BILITOT, ALBUMIN,  in the last 72 hours Cardiac Enzymes No results found for this basename: TROPONINI, PROBNP,  in the last 72 hours Glucose Recent Labs     03/05/13  0751  03/05/13  1144  03/05/13  1545  03/05/13  2023  03/06/13  0033  03/06/13  0343  GLUCAP  219*  225*  195*  208*  242*  218*    Imaging Dg Chest Port 1 View  03/05/2013   CLINICAL DATA:  Respiratory failure.  EXAM: PORTABLE CHEST - 1 VIEW  COMPARISON:  03/03/2013.  FINDINGS: Tracheostomy tube tip 4.2 cm above the carina.  Sequential pacemaker enters from the left with leads unchanged in position.  Cardiomegaly.  Pulmonary vascular congestion/ pulmonary edema.  Lucency adjacent to the at right heart border and mediastinum may represent  overlying structures however, pneumomediastinum or the anteriorly located right-sided pneumothorax not entirely excluded. Attention to this on follow up.  Tortuous aorta.  Nasogastric tube is in place with the tip not imaged.  IMPRESSION: Cardiomegaly.  Pulmonary vascular congestion/ pulmonary edema.  Lucency adjacent to the at right heart border and mediastinum may represent overlying structures however, pneumomediastinum or the anteriorly located right-sided pneumothorax not entirely excluded. Attention to this on follow up.  This is a call report.   Electronically Signed   By: Bridgett Larsson M.D.   On: 03/05/2013 07:40   Dg Abd Portable 1v  03/04/2013   CLINICAL DATA:  Feeding tube placement.  EXAM: PORTABLE ABDOMEN - 1 VIEW  COMPARISON:  02/24/2013  FINDINGS: A feeding tube tip has been inserted and lies with its tip along the greater curvature of the stomach near the pylorus. Gas pattern is nonspecific.  IMPRESSION: Feeding tube tip distal stomach.   Electronically Signed   By: Davonna Belling M.D.   On: 03/04/2013 17:18     CXR:  None today  ASSESSMENT / PLAN:  PULMONARY A: Acute on chronic hypercarbic respiratory failure, s/p tracheostomy Obesity hypoventilation syndrome OSA (+ possible central SA) Difficult airway P:   No vent necessary thus far, monitor for central apneas Scheduled BDs Suspect she will require trach for life, possibly nocturnal vent depending on central apneas on outpt PSG.  Dr Suszanne Conners has instructed that trach should not be changed before 10 days (not before 10/25)  CARDIOVASCULAR A:  Acute on chronic diastolic congestive heart failure, neg 11.1 L for admission Paroxsymal SVT Symptomatic bradycardia s/p PPM placement, currently paced HTN P:  Hold diuresis currently, see below Continue clonidine, hydralazine, diltiazem, coreg  RENAL A:   AKI, exacerbated by diuresis Compensatory elevation in HCO3 Hypokalemia hypernatremia P:   Hold diuretics for now, cre  worsened in last 24 hours Monitor BMET intermittently Correct electrolytes as indicated Free water flushes, 200 Q6  GASTROINTESTINAL A:   Morbid obesity P:   SUP: IV famotidine Restart TF, assess for cuff diet and diet   HEMATOLOGIC A:   Normocytic anemia- likely of chronic disease + losses from trach site P:  Trend CBC Continue iron  INFECTIOUS A:   No obvious infection, but progressive leukocytosis P:   Obtain blood, urine, resp cx 10/17 Low threshold abx if she changes clinically  ENDOCRINE A:   DM2  P:   Lantus + SSI, increase Lantus to 40  NEUROLOGIC A:   No acute concerns P:   Rass goal of 0 PRN fentanyl  TODAY'S SUMMARY: Will continue trach collar and assess for cuff and diet today. Monitor CBGs for improvement with increased Lantus starting today. Transfer to SDU 10/17   Murtis Sink, MD [facility] Resident, PGY-2 03/06/2013, 8:06 AM   Levy Pupa, MD, PhD 03/06/2013, 12:03 PM [facility] [phone] or if no answer [phone]

## 2013-03-06 NOTE — Progress Notes (Signed)
Morning ABG results were reported to ICU physician by RN.

## 2013-03-07 ENCOUNTER — Inpatient Hospital Stay (HOSPITAL_COMMUNITY): Payer: Medicaid Other

## 2013-03-07 LAB — POCT I-STAT 3, ART BLOOD GAS (G3+)
Acid-Base Excess: 15 mmol/L — ABNORMAL HIGH (ref 0.0–2.0)
Bicarbonate: 41 mEq/L — ABNORMAL HIGH (ref 20.0–24.0)
O2 Saturation: 96 %
TCO2: 43 mmol/L (ref 0–100)
pH, Arterial: 7.42 (ref 7.350–7.450)
pO2, Arterial: 81 mmHg (ref 80.0–100.0)

## 2013-03-07 LAB — GLUCOSE, CAPILLARY
Glucose-Capillary: 169 mg/dL — ABNORMAL HIGH (ref 70–99)
Glucose-Capillary: 180 mg/dL — ABNORMAL HIGH (ref 70–99)
Glucose-Capillary: 187 mg/dL — ABNORMAL HIGH (ref 70–99)
Glucose-Capillary: 216 mg/dL — ABNORMAL HIGH (ref 70–99)

## 2013-03-07 LAB — BASIC METABOLIC PANEL
BUN: 99 mg/dL — ABNORMAL HIGH (ref 6–23)
CO2: 31 mEq/L (ref 19–32)
Chloride: 100 mEq/L (ref 96–112)
Creatinine, Ser: 2.09 mg/dL — ABNORMAL HIGH (ref 0.50–1.10)
GFR calc Af Amer: 29 mL/min — ABNORMAL LOW (ref >90)
Glucose, Bld: 177 mg/dL — ABNORMAL HIGH (ref 70–99)
Potassium: 4.7 mEq/L (ref 3.5–5.1)

## 2013-03-07 LAB — URINE CULTURE: Colony Count: >=100000

## 2013-03-07 LAB — CBC
MCH: 28 pg (ref 26.0–34.0)
MCHC: 31.8 g/dL (ref 30.0–36.0)
Platelets: 270 10*3/uL (ref 150–400)
RBC: 2.89 MIL/uL — ABNORMAL LOW (ref 3.87–5.11)
RDW: 17.5 % — ABNORMAL HIGH (ref 11.5–15.5)

## 2013-03-07 MED ORDER — ALBUTEROL SULFATE (5 MG/ML) 0.5% IN NEBU
2.5000 mg | INHALATION_SOLUTION | RESPIRATORY_TRACT | Status: DC | PRN
  Filled 2013-03-07: qty 0.5

## 2013-03-07 MED ORDER — HEPARIN SODIUM (PORCINE) 5000 UNIT/ML IJ SOLN
5000.0000 [IU] | Freq: Three times a day (TID) | INTRAMUSCULAR | Status: DC
  Administered 2013-03-07 – 2013-03-27 (×61): 5000 [IU] via SUBCUTANEOUS
  Filled 2013-03-07 (×64): qty 1

## 2013-03-07 NOTE — Progress Notes (Signed)
eLink Physician-Brief Progress Note Patient Name: [name] DOB: 08/03/1955 MRN: [id number]  Date of Service  03/07/2013   HPI/Events of Note   Significant event.  Patient with resp distress during the night.  Significant work of breathing.     eICU Interventions  Patient placed on mechanical ventilation.  cxr and bronchodilator treatment ordered.  Patient noted to have some short lived SVT during the event.  Mechanical ventilation markedly improved her status.        Deanna Artis 03/07/2013, 3:00 AM

## 2013-03-07 NOTE — Progress Notes (Signed)
Family visiting, updated on patients status.

## 2013-03-07 NOTE — Progress Notes (Signed)
eLink Physician-Brief Progress Note Patient Name: [name] DOB: 08/19/1955 MRN: [id number]  Date of Service  03/07/2013   HPI/Events of Note    eICU Interventions  Started heparin SQ for DVT proph      Deanna Artis 03/07/2013, 3:44 AM

## 2013-03-07 NOTE — Progress Notes (Signed)
PULMONARY  / CRITICAL CARE MEDICINE  Name: [name] MRN: [id number] DOB: [birth year]    ADMISSION DATE:  02/24/2013 CONSULTATION DATE:  03/03/2013  REFERRING MD :  Kari Baars PRIMARY SERVICE: PCCM  CHIEF COMPLAINT:  VDRF due to OHS, pulm edema  BRIEF PATIENT DESCRIPTION:  57 y/o female admitted 10/07 to [facility] with hypercarbic failure due to OHS and CHF. Failed extubation 10/11 and difficult re-intubation (6.0 ETT). Transferred to [facility] ICU 10/14 for further eval and mgmt  SIGNIFICANT EVENTS / STUDIES:  10/7 Admitted for hypercarbic resp failure, OHS, CHF.  Intubated 10/9 transient SVT controlled with IV cardizem, transitioned to PO cardizem and coreg 10/11 pt extubated and re-intubated, steroids, difficult re-intubation used # 6 tube 10/14 transferred to [facility] ICU, likely needs trach 10/15 tracheostomy tube placed By ENT  LINES / TUBES: ETT 10/07 >> 10/11, 10/11>>>10/15 Tracheostomy 10/15>>> Foley 10/8>>>  CULTURES: MRSA PCR 10/7 >> NEG 10-17 bc>> 10-17 sputum>> 10-17 uc>> ANTIBIOTICS: None  SUBJECTIVE:  No acute events, oozing from trach site seems to have slowed/stopperd. Pt without pain or resp distress.   VITAL SIGNS: Temp:  [98 F (36.7 C)-99.6 F (37.6 C)] 99.1 F (37.3 C) (10/18 0747) Pulse Rate:  [57-128] 69 (10/18 1227) Resp:  [10-32] 22 (10/18 1227) BP: (57-169)/(43-117) 140/75 mmHg (10/18 1227) SpO2:  [93 %-100 %] 100 % (10/18 1230) FiO2 (%):  [28 %-40 %] 40 % (10/18 1227) Weight:  [387 lb 9.1 oz (175.8 kg)] 387 lb 9.1 oz (175.8 kg) (10/18 0500) HEMODYNAMICS:   VENTILATOR SETTINGS: Vent Mode:  [-] PRVC FiO2 (%):  [28 %-40 %] 40 % Set Rate:  [12 bmp] 12 bmp Vt Set:  [500 mL] 500 mL PEEP:  [5 cmH20] 5 cmH20 Pressure Support:  [10 cmH20] 10 cmH20 Plateau Pressure:  [20 cmH20-34 cmH20] 20 cmH20 INTAKE / OUTPUT: Intake/Output     10/17 0701 - 10/18 0700 10/18 0701 - 10/19 0700   I.V. (mL/kg) 240 (1.4) 40 (0.2)   NG/GT 1820 200   IV Piggyback      Total Intake(mL/kg) 2060 (11.7) 240 (1.4)   Urine (mL/kg/hr) 1850 (0.4)    Total Output 1850     Net +210 +240          PHYSICAL EXAMINATION: Gen: morbidly obese, lying in bed, comfortable on trach collar HEENT: NCAT, EOMI, MMM CV: RRR, paced on tele monitor,  good S1/S2, no murmur Resp: CTABL, no wheezes, good air movement Abd: SNTND, BS present, no guarding or organomegaly Ext: chronic venous stasis changes on BL LE, 1+ pitting edema BL Neuro: Alert, Rass 0, no focal deficits  LABS:  CBC Recent Labs     03/05/13  1551  03/06/13  0406  03/06/13  1806  WBC  21.0*  23.7*  23.7*  HGB  9.5*  8.8*  9.0*  HCT  30.5*  27.8*  28.8*  PLT  330  315  315   Coag's Recent Labs    03/05/13  INR  1.10   BMET Recent Labs     03/05/13  0001  03/06/13  0406  NA  150*  150*  K  3.8  3.3*  CL  103  103  CO2  35*  38*  BUN  62*  83*  CREATININE  1.48*  1.94*  GLUCOSE  278*  229*   Electrolytes Recent Labs     03/05/13  0001  03/06/13  0406  CALCIUM  8.6  8.5   Sepsis Markers No results  found for this basename: LACTICACIDVEN, PROCALCITON, O2SATVEN,  in the last 72 hours ABG Recent Labs     03/05/13  0523  03/06/13  0339  03/07/13  0403  PHART  7.378  7.412  7.420  PCO2ART  66.9*  65.0*  63.1*  PO2ART  106.0*  60.0*  81.0   Liver Enzymes No results found for this basename: AST, ALT, ALKPHOS, BILITOT, ALBUMIN,  in the last 72 hours Cardiac Enzymes No results found for this basename: TROPONINI, PROBNP,  in the last 72 hours Glucose Recent Labs     03/06/13  1621  03/06/13  2032  03/06/13  2342  03/07/13  0439  03/07/13  0741  03/07/13  1133  GLUCAP  180*  198*  216*  190*  169*  161*    Imaging Dg Chest Port 1 View  03/07/2013   CLINICAL DATA:  Shortness of breath, pulmonary edema  EXAM: PORTABLE CHEST - 1 VIEW  COMPARISON:  Prior radiograph from 03/05/2013.  FINDINGS: Tracheostomy overlies the upper airway. Enteric tube courses into the abdomen.  Pacemaker wires overlie the heart. Cardiomegaly is stable as compared to prior exam.  Lungs are mildly hypoinflated. There is mild diffuse pulmonary vascular congestion without frank pulmonary edema. Persistent blunting of the left costophrenic angle suggestive of a persistent small pleural effusion. No definite focal infiltrate. No pneumothorax.  IMPRESSION: Cardiomegaly with mild pulmonary vascular congestion, slightly improved as compared to prior study from 03/05/2013.  Small left pleural effusion.   Electronically Signed   By: Rise Mu M.D.   On: 03/07/2013 04:06     CXR:  None today  ASSESSMENT / PLAN:  PULMONARY A: Acute on chronic hypercarbic respiratory failure, s/p tracheostomy Obesity hypoventilation syndrome OSA (+ possible central SA) Difficult airway P:   No vent necessary thus far, monitor for central apneas Scheduled BDs Suspect she will require trach for life, possibly nocturnal vent depending on central apneas on outpt PSG.  Dr Suszanne Conners has instructed that trach should not be changed before 10 days (not before 10/25)  CARDIOVASCULAR A:  Acute on chronic diastolic congestive heart failure, neg 11.1 L for admission Paroxsymal SVT Symptomatic bradycardia s/p PPM placement, currently paced HTN P:  Hold diuresis currently, see below Continue clonidine, hydralazine, diltiazem, coreg  RENAL Lab Results  Component Value Date   CREATININE 1.94* 03/06/2013   CREATININE 1.48* 03/05/2013   CREATININE 1.54* 03/04/2013    A:   AKI, exacerbated by diuresis Compensatory elevation in HCO3 Hypokalemia hypernatremia P:   Hold diuretics for now, cre worsened in last 24 hours Monitor BMET intermittently Correct electrolytes as indicated Free water flushes, 200 Q6 Check labs  GASTROINTESTINAL A:   Morbid obesity P:   SUP: IV famotidine Restart TF,   HEMATOLOGIC  Recent Labs  03/06/13 0406 03/06/13 1806  HGB 8.8* 9.0*      A:   Normocytic  anemia- likely of chronic disease + losses from trach site P:  Trend CBC Continue iron  INFECTIOUS A:   No obvious infection, but progressive leukocytosis P:   Obtain blood, urine, resp cx 10/17 Low threshold abx if she changes clinically  ENDOCRINE A:   DM2 P:   Lantus + SSI, increase Lantus to 40  NEUROLOGIC A:   No acute concerns P:   Rass goal of 0 PRN fentanyl  TODAY'S SUMMARY: Will continue trach collar and assess for cuff and diet today. Monitor CBGs for improvement with increased Lantus starting today. Transfer to SDU 10/18  Brett Canales Minor ACNP [facility] PCCM Pager [phone] till 3 pm If no answer page [phone] 03/07/2013, 12:36 PM   I have interviewed and examined the patient and reviewed the database. I have formulated the assessment and plan as reflected in the note above with amendments made by me.  Billy Fischer, MD;  PCCM service; Mobile [phone]

## 2013-03-07 NOTE — Progress Notes (Signed)
RT tried to place pt on ATC. Pt felt like she couldn't breathe and asked to be placed back on the ventilator. RT then returned her back to previous vent settings PSV 10/5. Pt still felt like it was still hard for her to breathe. RT placed pt on previous full support vent settings. No complications. Vital signs stable at this time. RT and RN replaced PT trach ties. No complications. RT will continue to monitor.

## 2013-03-07 NOTE — Progress Notes (Signed)
Subjective: Pt back on vent last night due to respiratory distress.  No more bleeding.  Objective: Vital signs in last 24 hours: Temp:  [98 F (36.7 C)-99.6 F (37.6 C)] 99.1 F (37.3 C) (10/18 0747) Pulse Rate:  [57-128] 69 (10/18 1227) Resp:  [10-32] 22 (10/18 1227) BP: (57-169)/(43-117) 140/75 mmHg (10/18 1227) SpO2:  [93 %-100 %] 100 % (10/18 1230) FiO2 (%):  [28 %-40 %] 40 % (10/18 1227) Weight:  [175.8 kg (387 lb 9.1 oz)] 175.8 kg (387 lb 9.1 oz) (10/18 0500)  Pt sedated. Morbidly obese. On vent.  Venting well via trach. Trach midline, in place.  No bleeding.   Recent Labs  03/06/13 0406 03/06/13 1806  WBC 23.7* 23.7*  HGB 8.8* 9.0*  HCT 27.8* 28.8*  PLT 315 315    Recent Labs  03/05/13 0001 03/06/13 0406  NA 150* 150*  K 3.8 3.3*  CL 103 103  CO2 35* 38*  GLUCOSE 278* 229*  BUN 62* 83*  CREATININE 1.48* 1.94*  CALCIUM 8.6 8.5    Medications:  I have reviewed the patient's current medications. Scheduled: . albuterol  2.5 mg Nebulization Q4H  . antiseptic oral rinse  15 mL Mouth Rinse q12n4p  . carvedilol  25 mg Oral BID WC  . chlorhexidine  15 mL Mouth Rinse BID  . cloNIDine  0.3 mg Oral TID  . diltiazem  30 mg Oral Q6H  . famotidine  20 mg Per Tube BID  . feeding supplement (PRO-STAT SUGAR FREE 64)  30 mL Per Tube TID  . ferrous sulfate  300 mg Per Tube BID WC  . free water  200 mL Per Tube Q6H  . heparin subcutaneous  5,000 Units Subcutaneous Q8H  . hydrALAZINE  100 mg Oral Q6H  . insulin aspart  0-20 Units Subcutaneous Q4H  . insulin aspart  3 Units Subcutaneous Q4H  . insulin glargine  40 Units Subcutaneous Daily  . nystatin   Topical BID   [id number], acetaminophen, albuterol, LORazepam, ondansetron (ZOFRAN) IV, ondansetron, promethazine  Assessment/Plan: POD#3 s/p trach. Back on vent. No bleeding at this time. Routine trach care.  Will change trach around POD #10.    LOS: 11 days   Janeva Peaster,SUI W 03/07/2013, 2:33 PM

## 2013-03-08 DIAGNOSIS — J9620 Acute and chronic respiratory failure, unspecified whether with hypoxia or hypercapnia: Principal | ICD-10-CM

## 2013-03-08 LAB — CULTURE, RESPIRATORY W GRAM STAIN

## 2013-03-08 LAB — CBC
HCT: 22.3 % — ABNORMAL LOW (ref 36.0–46.0)
HCT: 22.3 % — ABNORMAL LOW (ref 36.0–46.0)
Hemoglobin: 7.1 g/dL — ABNORMAL LOW (ref 12.0–15.0)
Hemoglobin: 7.3 g/dL — ABNORMAL LOW (ref 12.0–15.0)
MCH: 28.2 pg (ref 26.0–34.0)
MCHC: 31.8 g/dL (ref 30.0–36.0)
MCHC: 32.7 g/dL (ref 30.0–36.0)
Platelets: 291 10*3/uL (ref 150–400)
RBC: 2.52 MIL/uL — ABNORMAL LOW (ref 3.87–5.11)
RBC: 2.52 MIL/uL — ABNORMAL LOW (ref 3.87–5.11)
RDW: 17.6 % — ABNORMAL HIGH (ref 11.5–15.5)
WBC: 14.3 10*3/uL — ABNORMAL HIGH (ref 4.0–10.5)
WBC: 16 10*3/uL — ABNORMAL HIGH (ref 4.0–10.5)

## 2013-03-08 LAB — POCT I-STAT 3, ART BLOOD GAS (G3+)
TCO2: 42 mmol/L (ref 0–100)
pCO2 arterial: 56.8 mmHg — ABNORMAL HIGH (ref 35.0–45.0)
pH, Arterial: 7.46 — ABNORMAL HIGH (ref 7.350–7.450)
pO2, Arterial: 119 mmHg — ABNORMAL HIGH (ref 80.0–100.0)

## 2013-03-08 LAB — GLUCOSE, CAPILLARY
Glucose-Capillary: 137 mg/dL — ABNORMAL HIGH (ref 70–99)
Glucose-Capillary: 146 mg/dL — ABNORMAL HIGH (ref 70–99)
Glucose-Capillary: 158 mg/dL — ABNORMAL HIGH (ref 70–99)
Glucose-Capillary: 162 mg/dL — ABNORMAL HIGH (ref 70–99)
Glucose-Capillary: 166 mg/dL — ABNORMAL HIGH (ref 70–99)
Glucose-Capillary: 174 mg/dL — ABNORMAL HIGH (ref 70–99)

## 2013-03-08 LAB — BASIC METABOLIC PANEL
BUN: 102 mg/dL — ABNORMAL HIGH (ref 6–23)
CO2: 36 mEq/L — ABNORMAL HIGH (ref 19–32)
Chloride: 100 mEq/L (ref 96–112)
GFR calc non Af Amer: 26 mL/min — ABNORMAL LOW (ref >90)
Glucose, Bld: 173 mg/dL — ABNORMAL HIGH (ref 70–99)
Potassium: 3.4 mEq/L — ABNORMAL LOW (ref 3.5–5.1)
Sodium: 145 mEq/L (ref 135–145)

## 2013-03-08 MED ORDER — POTASSIUM CHLORIDE 20 MEQ/15ML (10%) PO LIQD
20.0000 meq | Freq: Once | ORAL | Status: AC
  Administered 2013-03-09: 20 meq
  Filled 2013-03-08: qty 15

## 2013-03-08 MED ORDER — POTASSIUM CHLORIDE 20 MEQ/15ML (10%) PO LIQD
20.0000 meq | Freq: Once | ORAL | Status: AC
  Administered 2013-03-08: 20 meq
  Filled 2013-03-08: qty 15

## 2013-03-08 NOTE — Progress Notes (Signed)
Subjective: Pt is off vent. On trach collar.  No bleeding.    Objective: Vital signs in last 24 hours: Temp:  [97.5 F (36.4 C)-98.8 F (37.1 C)] 98.8 F (37.1 C) (10/19 0757) Pulse Rate:  [51-82] 71 (10/19 0915) Resp:  [12-22] 16 (10/19 0915) BP: (79-154)/(51-84) 129/82 mmHg (10/19 0915) SpO2:  [96 %-100 %] 100 % (10/19 0915) FiO2 (%):  [40 %] 40 % (10/19 0915) Weight:  [177 kg (390 lb 3.4 oz)] 177 kg (390 lb 3.4 oz) (10/19 0500)  Pt awake and responsive. NAD. Morbidly obese.  No acute bleeding is noted.  Trach midline and in place. Good air movement via trach.  Recent Labs  03/07/13 1713 03/08/13 0425  WBC 20.0* 14.3*  HGB 8.1* 7.1*  HCT 25.5* 22.3*  PLT 270 247    Recent Labs  03/07/13 1547 03/08/13 0425  NA 143 145  K 4.7 3.4*  CL 100 100  CO2 31 36*  GLUCOSE 177* 173*  BUN 99* 102*  CREATININE 2.09* 2.03*  CALCIUM 8.6 8.5    Medications:  I have reviewed the patient's current medications. Scheduled: . albuterol  2.5 mg Nebulization Q4H  . antiseptic oral rinse  15 mL Mouth Rinse q12n4p  . carvedilol  25 mg Oral BID WC  . chlorhexidine  15 mL Mouth Rinse BID  . cloNIDine  0.3 mg Oral TID  . diltiazem  30 mg Oral Q6H  . famotidine  20 mg Per Tube BID  . feeding supplement (PRO-STAT SUGAR FREE 64)  30 mL Per Tube TID  . ferrous sulfate  300 mg Per Tube BID WC  . free water  200 mL Per Tube Q6H  . heparin subcutaneous  5,000 Units Subcutaneous Q8H  . hydrALAZINE  100 mg Oral Q6H  . insulin aspart  0-20 Units Subcutaneous Q4H  . insulin aspart  3 Units Subcutaneous Q4H  . insulin glargine  40 Units Subcutaneous Daily  . nystatin   Topical BID   [id number], acetaminophen, albuterol, LORazepam, ondansetron (ZOFRAN) IV, ondansetron, promethazine  Assessment/Plan: POD#4 s/p trach. Breathing well on trach collar. No bleeding. Routine trach care.  Will change trach around POD #10.    LOS: 12 days   Kamoni Depree,SUI W 03/08/2013, 10:21 AM

## 2013-03-08 NOTE — Progress Notes (Signed)
PULMONARY  / CRITICAL CARE MEDICINE  Name: [name] MRN: [id number] DOB: 1955/11/20    ADMISSION DATE:  02/24/2013 CONSULTATION DATE:  03/03/2013  REFERRING MD :  Kari Baars PRIMARY SERVICE: PCCM  CHIEF COMPLAINT:  VDRF due to OHS, pulm edema  BRIEF PATIENT DESCRIPTION:  58 y/o female admitted 10/07 to [facility] with hypercarbic failure due to OHS and CHF. Failed extubation 10/11 and difficult re-intubation (6.0 ETT). Transferred to [facility] ICU 10/14 for further eval and mgmt  SIGNIFICANT EVENTS / STUDIES:  10/7 Admitted for hypercarbic resp failure, OHS, CHF.  Intubated 10/9 transient SVT controlled with IV cardizem, transitioned to PO cardizem and coreg 10/11 pt extubated and re-intubated, steroids, difficult re-intubation used # 6 tube 10/14 transferred to [facility] ICU, likely needs trach 10/15 tracheostomy tube placed By ENT  LINES / TUBES: ETT 10/07 >> 10/11, 10/11>>>10/15 Tracheostomy 10/15>>> Foley 10/8>>>  CULTURES: MRSA PCR 10/7 >> NEG 10-17 bc>> 10-17 sputum>>gpc,gnr,gpr>> 10-17 uc>>mul species ANTIBIOTICS: None  SUBJECTIVE:  No acute events, oozing from trach site seems to have slowed/stopperd. Pt without pain or resp distress.   VITAL SIGNS: Temp:  [97.5 F (36.4 C)-98.8 F (37.1 C)] 98.8 F (37.1 C) (10/19 0757) Pulse Rate:  [51-82] 65 (10/19 1000) Resp:  [12-22] 16 (10/19 1000) BP: (79-154)/(51-94) 124/94 mmHg (10/19 1000) SpO2:  [96 %-100 %] 100 % (10/19 1000) FiO2 (%):  [40 %] 40 % (10/19 0915) Weight:  [390 lb 3.4 oz (177 kg)] 390 lb 3.4 oz (177 kg) (10/19 0500) HEMODYNAMICS:   VENTILATOR SETTINGS: Vent Mode:  [-] PRVC FiO2 (%):  [40 %] 40 % Set Rate:  [12 bmp] 12 bmp Vt Set:  [500 mL] 500 mL PEEP:  [5 cmH20] 5 cmH20 Plateau Pressure:  [20 cmH20-30 cmH20] 24 cmH20 INTAKE / OUTPUT: Intake/Output     10/18 0701 - 10/19 0700 10/19 0701 - 10/20 0700   I.V. (mL/kg) 240 (1.4) 30 (0.2)   NG/GT 2000 150   Total Intake(mL/kg) 2240 (12.7) 180 (1)   Urine (mL/kg/hr) 1570 (0.4)    Total Output 1570     Net +670 +180          PHYSICAL EXAMINATION: Gen: morbidly obese, lying in bed, comfortable on trach collar HEENT: NCAT, EOMI, MMM, trach with old blood CV: RRR, paced on tele monitor,  good S1/S2, no murmur Resp: CTABL, no wheezes, good air movement Abd: SNTND, BS present, no guarding or organomegaly Ext: chronic venous stasis changes on BL LE, 1+ pitting edema BL Neuro: Alert, Rass 0, no focal deficits  LABS:  CBC Recent Labs     03/06/13  1806  03/07/13  1713  03/08/13  0425  WBC  23.7*  20.0*  14.3*  HGB  9.0*  8.1*  7.1*  HCT  28.8*  25.5*  22.3*  PLT  315  270  247   Coag's No results found for this basename: APTT, INR,  in the last 72 hours BMET Recent Labs     03/06/13  0406  03/07/13  1547  03/08/13  0425  NA  150*  143  145  K  3.3*  4.7  3.4*  CL  103  100  100  CO2  38*  31  36*  BUN  83*  99*  102*  CREATININE  1.94*  2.09*  2.03*  GLUCOSE  229*  177*  173*   Electrolytes Recent Labs     03/06/13  0406  03/07/13  1547  03/08/13  0425  CALCIUM  8.5  8.6  8.5   Sepsis Markers No results found for this basename: LACTICACIDVEN, PROCALCITON, O2SATVEN,  in the last 72 hours ABG Recent Labs     03/06/13  0339  03/07/13  0403  03/08/13  0331  PHART  7.412  7.420  7.460*  PCO2ART  65.0*  63.1*  56.8*  PO2ART  60.0*  81.0  119.0*   Liver Enzymes No results found for this basename: AST, ALT, ALKPHOS, BILITOT, ALBUMIN,  in the last 72 hours Cardiac Enzymes No results found for this basename: TROPONINI, PROBNP,  in the last 72 hours Glucose Recent Labs     03/07/13  1133  03/07/13  1603  03/07/13  2044  03/08/13  0032  03/08/13  0400  03/08/13  0707  GLUCAP  161*  187*  180*  166*  162*  137*    Imaging Dg Chest Port 1 View  03/07/2013   CLINICAL DATA:  Shortness of breath, pulmonary edema  EXAM: PORTABLE CHEST - 1 VIEW  COMPARISON:  Prior radiograph from 03/05/2013.  FINDINGS:  Tracheostomy overlies the upper airway. Enteric tube courses into the abdomen. Pacemaker wires overlie the heart. Cardiomegaly is stable as compared to prior exam.  Lungs are mildly hypoinflated. There is mild diffuse pulmonary vascular congestion without frank pulmonary edema. Persistent blunting of the left costophrenic angle suggestive of a persistent small pleural effusion. No definite focal infiltrate. No pneumothorax.  IMPRESSION: Cardiomegaly with mild pulmonary vascular congestion, slightly improved as compared to prior study from 03/05/2013.  Small left pleural effusion.   Electronically Signed   By: Rise Mu M.D.   On: 03/07/2013 04:06     CXR:  See above  ASSESSMENT / PLAN:  PULMONARY A: Acute on chronic hypercarbic respiratory failure, s/p tracheostomy Obesity hypoventilation syndrome OSA (+ possible central SA) Difficult airway P:   No vent necessary thus far, monitor for central apneas Scheduled BDs Suspect she will require trach for life, possibly nocturnal vent depending on central apneas on outpt PSG.  Dr Suszanne Conners has instructed that trach should not be changed before 10 days (not before 10/25) Note 10-19 old blood and foul smell from trach. ? Empirical abx. No fever , chills sweats and wbc is trending down.  CARDIOVASCULAR A:  Acute on chronic diastolic congestive heart failure, neg 11.1 L for admission Paroxsymal SVT Symptomatic bradycardia s/p PPM placement, currently paced HTN P:  Hold diuresis currently, see below Continue clonidine, hydralazine, diltiazem, coreg  RENAL Lab Results  Component Value Date   CREATININE 2.03* 03/08/2013   CREATININE 2.09* 03/07/2013   CREATININE 1.94* 03/06/2013    Recent Labs Lab 03/06/13 0406 03/07/13 1547 03/08/13 0425  NA 150* 143 145    A:   AKI, exacerbated by diuresis Compensatory elevation in HCO3 Hypokalemia hypernatremia P:   Hold diuretics for now, cre worsened in last 24 hours Monitor BMET  intermittently Correct electrolytes as indicated Free water flushes, 200 Q6 , na improving Check labs  GASTROINTESTINAL A:   Morbid obesity P:   SUP: IV famotidine Restarted TF,   HEMATOLOGIC  Recent Labs  03/07/13 1713 03/08/13 0425  HGB 8.1* 7.1*      A:   Normocytic anemia- likely of chronic disease + losses from trach site P:  Trend CBC Continue iron  INFECTIOUS A:   No obvious infection, but progressive leukocytosis P:   Obtain blood, urine, resp cx 10/17 Low threshold abx if she changes clinically, no changes 10-19.  ENDOCRINE CBG (last 3)   Recent Labs  03/08/13 0032 03/08/13 0400 03/08/13 0707  GLUCAP 166* 162* 137*    \ A:   DM2 P:   Lantus + SSI, increase Lantus to 40 10-18, note trend  NEUROLOGIC A:   No acute concerns P:   PRN fentanyl  TODAY'S SUMMARY: Will continue trach collar and assess for cuff and diet 10-19 Transfer to SDU 10/18 , 10-19 awaits sdu bed.   Brett Canales Minor ACNP [facility] PCCM Pager [phone] till 3 pm If no answer page [phone] 03/08/2013, 11:14 AM  I have interviewed and examined the patient and reviewed the database. I have formulated the assessment and plan as reflected in the note above with amendments made by me.  Billy Fischer, MD;  PCCM service; Mobile [phone]

## 2013-03-08 NOTE — Progress Notes (Signed)
Report given to RN Renea Ee on 2600. Patients husband also notified of new room assignment.

## 2013-03-08 NOTE — Progress Notes (Signed)
[facility] ADULT ICU REPLACEMENT PROTOCOL FOR AM LAB REPLACEMENT ONLY  The patient does not apply for the [facility] Adult ICU Electrolyte Replacment Protocol based on the criteria listed below:     Is BUN < 60 mg/dL? no  Patient's BUN today is 102  Abnormal electrolyte(s): K3.4   If a panic level lab has been reported, has the CCM MD in charge been notified? yes.   Physician:  Adella Nissen 03/08/2013 5:54 AM

## 2013-03-08 NOTE — Progress Notes (Signed)
Minimal bleeding at trach site and hemoglobin 7.1 noted and notifies NP, Minor. Orders received>repeat cbc

## 2013-03-08 NOTE — Progress Notes (Signed)
eLink Physician-Brief Progress Note Patient Name: [name] DOB: 23-Dec-1955 MRN: [id number]  Date of Service  03/08/2013   HPI/Events of Note   hypokalemia  eICU Interventions  20 meq KCL per tube      Deanna Artis 03/08/2013, 5:24 AM

## 2013-03-08 NOTE — Progress Notes (Signed)
Husband and grandaughter Jonda visiting. I informed them that patient has been refusing turns. They encouraged patient to comply , but patient still refusing.NP, Brett Canales Minor made aware. Will continue to encourage patient.

## 2013-03-09 ENCOUNTER — Encounter (HOSPITAL_COMMUNITY): Payer: Self-pay | Admitting: Otolaryngology

## 2013-03-09 DIAGNOSIS — R609 Edema, unspecified: Secondary | ICD-10-CM

## 2013-03-09 LAB — CBC
Hemoglobin: 8.1 g/dL — ABNORMAL LOW (ref 12.0–15.0)
MCH: 27.6 pg (ref 26.0–34.0)
MCV: 89.4 fL (ref 78.0–100.0)
Platelets: 308 10*3/uL (ref 150–400)
RBC: 2.93 MIL/uL — ABNORMAL LOW (ref 3.87–5.11)
RDW: 17.8 % — ABNORMAL HIGH (ref 11.5–15.5)
WBC: 13 10*3/uL — ABNORMAL HIGH (ref 4.0–10.5)

## 2013-03-09 LAB — GLUCOSE, CAPILLARY
Glucose-Capillary: 134 mg/dL — ABNORMAL HIGH (ref 70–99)
Glucose-Capillary: 134 mg/dL — ABNORMAL HIGH (ref 70–99)
Glucose-Capillary: 144 mg/dL — ABNORMAL HIGH (ref 70–99)
Glucose-Capillary: 186 mg/dL — ABNORMAL HIGH (ref 70–99)
Glucose-Capillary: 193 mg/dL — ABNORMAL HIGH (ref 70–99)

## 2013-03-09 LAB — BASIC METABOLIC PANEL
BUN: 86 mg/dL — ABNORMAL HIGH (ref 6–23)
CO2: 36 mEq/L — ABNORMAL HIGH (ref 19–32)
Chloride: 100 mEq/L (ref 96–112)
Creatinine, Ser: 1.67 mg/dL — ABNORMAL HIGH (ref 0.50–1.10)
Glucose, Bld: 191 mg/dL — ABNORMAL HIGH (ref 70–99)
Potassium: 3.9 mEq/L (ref 3.5–5.1)

## 2013-03-09 NOTE — Progress Notes (Signed)
Subjective: Pt back on vent this morning due to respiratory distress. No more bleeding.  Objective: Vital signs in last 24 hours: Temp:  [98.4 F (36.9 C)-99.2 F (37.3 C)] 98.4 F (36.9 C) (10/20 0746) Pulse Rate:  [60-74] 67 (10/20 0746) Resp:  [13-24] 16 (10/20 0746) BP: (98-169)/(74-104) 156/79 mmHg (10/20 1019) SpO2:  [95 %-100 %] 100 % (10/20 0746) FiO2 (%):  [28 %-40 %] 40 % (10/20 0810) Weight:  [172.4 kg (380 lb 1.2 oz)] 172.4 kg (380 lb 1.2 oz) (10/20 0442)  Pt sedated. Morbidly obese.  On vent. Venting well via trach.  Trach midline, in place. No bleeding.  Recent Labs  03/08/13 1050 03/09/13 0323  WBC 16.0* 13.0*  HGB 7.3* 8.1*  HCT 22.3* 26.2*  PLT 291 308    Recent Labs  03/08/13 0425 03/09/13 0323  NA 145 146*  K 3.4* 3.9  CL 100 100  CO2 36* 36*  GLUCOSE 173* 191*  BUN 102* 86*  CREATININE 2.03* 1.67*  CALCIUM 8.5 9.3    Medications:  I have reviewed the patient's current medications. Scheduled: . albuterol  2.5 mg Nebulization Q4H  . antiseptic oral rinse  15 mL Mouth Rinse q12n4p  . carvedilol  25 mg Oral BID WC  . chlorhexidine  15 mL Mouth Rinse BID  . cloNIDine  0.3 mg Oral TID  . diltiazem  30 mg Oral Q6H  . famotidine  20 mg Per Tube BID  . feeding supplement (PRO-STAT SUGAR FREE 64)  30 mL Per Tube TID  . ferrous sulfate  300 mg Per Tube BID WC  . free water  200 mL Per Tube Q6H  . heparin subcutaneous  5,000 Units Subcutaneous Q8H  . hydrALAZINE  100 mg Oral Q6H  . insulin aspart  0-20 Units Subcutaneous Q4H  . insulin aspart  3 Units Subcutaneous Q4H  . insulin glargine  40 Units Subcutaneous Daily  . nystatin   Topical BID   WUX:[id number], acetaminophen, albuterol, LORazepam, ondansetron (ZOFRAN) IV, promethazine  Assessment/Plan: POD#5 s/p trach. Back on vent. No bleeding at this time. Routine trach care.  Will change trach around POD #10.    LOS: 13 days   Nolin Grell,SUI W 03/09/2013, 11:13 AM

## 2013-03-09 NOTE — Progress Notes (Signed)
At around 0800 RN staff was called to pt's room.  Pt in obvious respiratory distress.  Suctioning attempted and was able to suction thick yellow, blood tinged secretions, but pt continued to worsen, becoming increasingly anxious, tachypnic, tachycardic and visible increase in work of breathing.  Pt was then bagged with inline O2. At that time pt's O2 sats dropped as low as 73% with a heart rate sustaining in the 140's.  Pt did eventually respond well to the interventions and was transitioned to the ventilator for support by RT.  Will continue to monitor.

## 2013-03-09 NOTE — Progress Notes (Signed)
SLP Cancellation Note  Patient Details Name: [name] MRN: [id number] DOB: 1955-12-05   Cancelled treatment:       Reason Eval/Treat Not Completed: Medical issues which prohibited therapy (Patient back on vent. Will f/u. )  Ferdinand Lango MA, CCC-SLP [phone]  [name] 03/09/2013, 2:49 PM

## 2013-03-09 NOTE — Progress Notes (Signed)
PULMONARY  / CRITICAL CARE MEDICINE  Name: [name] MRN: [id number] DOB: 01/09/1956    ADMISSION DATE:  02/24/2013 CONSULTATION DATE:  03/03/2013  REFERRING MD :  Kari Baars PRIMARY SERVICE: PCCM  CHIEF COMPLAINT:  VDRF due to OHS, pulm edema  BRIEF PATIENT DESCRIPTION:  57 y/o female admitted 10/07 to [facility] with hypercarbic failure due to OHS and CHF. Failed extubation 10/11 and difficult re-intubation (6.0 ETT). Transferred to [facility] ICU 10/14 for further eval and mgmt  SIGNIFICANT EVENTS / STUDIES:  10/7 Admitted for hypercarbic resp failure, OHS, CHF.  Intubated 10/9 transient SVT controlled with IV cardizem, transitioned to PO cardizem and coreg 10/11 pt extubated and re-intubated, steroids, difficult re-intubation used # 6 tube 10/14 transferred to [facility] ICU, likely needs trach 10/15 tracheostomy tube placed By ENT  LINES / TUBES: ETT 10/07 >> 10/11, 10/11>>>10/15 Tracheostomy 10/15>>> Foley 10/8>>>  CULTURES: MRSA PCR 10/7 >> NEG 10-17 bc>> 10-17 sputum>>gpc,gnr,gpr>> 10-17 uc>>mul species ANTIBIOTICS: None  SUBJECTIVE:  Patient decompensated this AM, had significant mucous plugging and had to be put back on the vent.  VITAL SIGNS: Temp:  [98.4 F (36.9 C)-99.2 F (37.3 C)] 98.4 F (36.9 C) (10/20 0746) Pulse Rate:  [60-74] 67 (10/20 0746) Resp:  [13-24] 16 (10/20 0746) BP: (98-169)/(74-104) 156/79 mmHg (10/20 1019) SpO2:  [95 %-100 %] 100 % (10/20 0746) FiO2 (%):  [28 %-40 %] 40 % (10/20 0810) Weight:  [172.4 kg (380 lb 1.2 oz)] 172.4 kg (380 lb 1.2 oz) (10/20 0442) HEMODYNAMICS:   VENTILATOR SETTINGS: Vent Mode:  [-] PRVC FiO2 (%):  [28 %-40 %] 40 % Set Rate:  [12 bmp] 12 bmp Vt Set:  [500 mL] 500 mL PEEP:  [5 cmH20] 5 cmH20 Plateau Pressure:  [26 cmH20] 26 cmH20 INTAKE / OUTPUT: Intake/Output     10/19 0701 - 10/20 0700 10/20 0701 - 10/21 0700   I.V. (mL/kg) 80 (0.5)    NG/GT 400    Total Intake(mL/kg) 480 (2.8)    Urine (mL/kg/hr) 2900  (0.7)    Total Output 2900     Net -2420          Stool Occurrence 1 x      PHYSICAL EXAMINATION: Gen: morbidly obese, lying in bed, comfortable on vent. HEENT: NCAT, EOMI, MMM, trach with old blood CV: RRR, paced on tele monitor,  good S1/S2, no murmur Resp: CTABL, no wheezes, good air movement Abd: SNTND, BS present, no guarding or organomegaly Ext: chronic venous stasis changes on BL LE, 1+ pitting edema BL Neuro: Alert, Rass 0, no focal deficits  LABS:  CBC Recent Labs     03/08/13  0425  03/08/13  1050  03/09/13  0323  WBC  14.3*  16.0*  13.0*  HGB  7.1*  7.3*  8.1*  HCT  22.3*  22.3*  26.2*  PLT  247  291  308   Coag's No results found for this basename: APTT, INR,  in the last 72 hours BMET Recent Labs     03/07/13  1547  03/08/13  0425  03/09/13  0323  NA  143  145  146*  K  4.7  3.4*  3.9  CL  100  100  100  CO2  31  36*  36*  BUN  99*  102*  86*  CREATININE  2.09*  2.03*  1.67*  GLUCOSE  177*  173*  191*   Electrolytes Recent Labs     03/07/13  1547  03/08/13  0425  03/09/13  0323  CALCIUM  8.6  8.5  9.3   Sepsis Markers No results found for this basename: LACTICACIDVEN, PROCALCITON, O2SATVEN,  in the last 72 hours ABG Recent Labs     03/07/13  0403  03/08/13  0331  PHART  7.420  7.460*  PCO2ART  63.1*  56.8*  PO2ART  81.0  119.0*   Liver Enzymes No results found for this basename: AST, ALT, ALKPHOS, BILITOT, ALBUMIN,  in the last 72 hours Cardiac Enzymes No results found for this basename: TROPONINI, PROBNP,  in the last 72 hours Glucose Recent Labs     03/08/13  1146  03/08/13  1639  03/08/13  1951  03/08/13  2330  03/09/13  0445  03/09/13  0745  GLUCAP  174*  158*  146*  134*  193*  186*   Imaging No results found.  CXR:  See above  ASSESSMENT / PLAN:  PULMONARY A: Acute on chronic hypercarbic respiratory failure, s/p tracheostomy Obesity hypoventilation syndrome OSA (+ possible central SA) Difficult airway P:    - Maintain back on vent, will reattempt TC later today if patient improves. - Scheduled BDs as ordered. - Suspect she will require trach for life, possibly nocturnal vent depending on central apneas on outpt PSG.  - Dr Suszanne Conners has instructed that trach should not be changed before 10 days (not before 10/25).  CARDIOVASCULAR A:  Acute on chronic diastolic congestive heart failure, neg 11.1 L for admission Paroxsymal SVT Symptomatic bradycardia s/p PPM placement, currently paced HTN P:  - Hold diuresis currently, see below. - Continue clonidine, hydralazine, diltiazem, coreg.  RENAL Lab Results  Component Value Date   CREATININE 1.67* 03/09/2013   CREATININE 2.03* 03/08/2013   CREATININE 2.09* 03/07/2013    Recent Labs Lab 03/07/13 1547 03/08/13 0425 03/09/13 0323  NA 143 145 146*   A:   AKI, exacerbated by diuresis Compensatory elevation in HCO3 Hypokalemia hypernatremia P:   - Continue to hold lasix, patient is auto-diuresing and renal function is improving. - Monitor BMET intermittently - Correct electrolytes as indicated - Free water flushes, 200 Q6 , na improving  GASTROINTESTINAL A:   Morbid obesity P:   - SUP: IV famotidine. - Continue TF.  HEMATOLOGIC  Recent Labs  03/08/13 1050 03/09/13 0323  HGB 7.3* 8.1*   A:   Normocytic anemia- likely of chronic disease + losses from trach site P:  - Trend CBC. - Continue iron supplement.  INFECTIOUS A:   No obvious infection, but progressive leukocytosis P:   - Obtain blood, urine, resp cx 10/17. - Low threshold abx if she changes clinically, no changes 10-19.  ENDOCRINE CBG (last 3)   Recent Labs  03/08/13 2330 03/09/13 0445 03/09/13 0745  GLUCAP 134* 193* 186*  A:   DM2 P:   - Lantus + SSI, increase Lantus to 40 10-18, note trend.  NEUROLOGIC A:   No acute concerns P:   - PRN fentanyl.  TODAY'S SUMMARY: Placed back on vent for this AM events.  Continue on vent for now and attempt TC  again later on today.  Imagine will need nocturnal vent for life unless looses a significant amount of weight.  Alyson Reedy, M.D. [facility] Pulmonary/Critical Care Medicine. Pager: [phone]. After hours pager: [phone].

## 2013-03-10 ENCOUNTER — Inpatient Hospital Stay (HOSPITAL_COMMUNITY): Payer: Medicaid Other

## 2013-03-10 LAB — BASIC METABOLIC PANEL
BUN: 73 mg/dL — ABNORMAL HIGH (ref 6–23)
Calcium: 8.9 mg/dL (ref 8.4–10.5)
Creatinine, Ser: 1.5 mg/dL — ABNORMAL HIGH (ref 0.50–1.10)
GFR calc Af Amer: 44 mL/min — ABNORMAL LOW (ref >90)
GFR calc non Af Amer: 38 mL/min — ABNORMAL LOW (ref >90)
Glucose, Bld: 158 mg/dL — ABNORMAL HIGH (ref 70–99)
Potassium: 4.1 mEq/L (ref 3.5–5.1)
Sodium: 149 mEq/L — ABNORMAL HIGH (ref 135–145)

## 2013-03-10 LAB — CBC
HCT: 23.6 % — ABNORMAL LOW (ref 36.0–46.0)
Hemoglobin: 7.3 g/dL — ABNORMAL LOW (ref 12.0–15.0)
MCH: 28.2 pg (ref 26.0–34.0)
MCHC: 30.9 g/dL (ref 30.0–36.0)
RDW: 17.7 % — ABNORMAL HIGH (ref 11.5–15.5)

## 2013-03-10 LAB — PHOSPHORUS: Phosphorus: 3.8 mg/dL (ref 2.3–4.6)

## 2013-03-10 LAB — GLUCOSE, CAPILLARY
Glucose-Capillary: 151 mg/dL — ABNORMAL HIGH (ref 70–99)
Glucose-Capillary: 144 mg/dL — ABNORMAL HIGH (ref 70–99)
Glucose-Capillary: 139 mg/dL — ABNORMAL HIGH (ref 70–99)

## 2013-03-10 LAB — MAGNESIUM: Magnesium: 2.7 mg/dL — ABNORMAL HIGH (ref 1.5–2.5)

## 2013-03-10 MED ORDER — SODIUM CHLORIDE 0.9 % IN NEBU
INHALATION_SOLUTION | RESPIRATORY_TRACT | Status: AC
  Administered 2013-03-10: 3 mL
  Filled 2013-03-10: qty 3

## 2013-03-10 MED ORDER — CARVEDILOL 3.125 MG PO TABS
3.1250 mg | ORAL_TABLET | Freq: Two times a day (BID) | ORAL | Status: DC
  Administered 2013-03-10 – 2013-03-23 (×26): 3.125 mg via ORAL
  Filled 2013-03-10 (×32): qty 1

## 2013-03-10 MED ORDER — FREE WATER
250.0000 mL | Status: DC
  Administered 2013-03-10 – 2013-03-11 (×6): 250 mL

## 2013-03-10 NOTE — Progress Notes (Signed)
Subjective: Pt remains on vent.  Venting well via trach.  No bleeding.  Objective: Vital signs in last 24 hours: Temp:  [98 F (36.7 C)-99.3 F (37.4 C)] 98.3 F (36.8 C) (10/21 0758) Pulse Rate:  [50-63] 50 (10/21 0828) Resp:  [13-23] 14 (10/21 0828) BP: (126-161)/(72-92) 154/82 mmHg (10/21 1027) SpO2:  [99 %-100 %] 99 % (10/21 0828) FiO2 (%):  [40 %] 40 % (10/21 0828) Weight:  [174.4 kg (384 lb 7.7 oz)] 174.4 kg (384 lb 7.7 oz) (10/21 0500)  Pt awake. Morbidly obese.  On vent. Venting well via trach.  Trach midline, in place. No bleeding.  Recent Labs  03/09/13 0323 03/10/13 0405  WBC 13.0* 9.6  HGB 8.1* 7.3*  HCT 26.2* 23.6*  PLT 308 293    Recent Labs  03/09/13 0323 03/10/13 0405  NA 146* 149*  K 3.9 4.1  CL 100 105  CO2 36* 36*  GLUCOSE 191* 158*  BUN 86* 73*  CREATININE 1.67* 1.50*  CALCIUM 9.3 8.9    Medications:  I have reviewed the patient's current medications. Scheduled: . albuterol  2.5 mg Nebulization Q4H  . antiseptic oral rinse  15 mL Mouth Rinse q12n4p  . carvedilol  25 mg Oral BID WC  . chlorhexidine  15 mL Mouth Rinse BID  . cloNIDine  0.3 mg Oral TID  . diltiazem  30 mg Oral Q6H  . famotidine  20 mg Per Tube BID  . feeding supplement (PRO-STAT SUGAR FREE 64)  30 mL Per Tube TID  . ferrous sulfate  300 mg Per Tube BID WC  . free water  200 mL Per Tube Q6H  . heparin subcutaneous  5,000 Units Subcutaneous Q8H  . hydrALAZINE  100 mg Oral Q6H  . insulin aspart  0-20 Units Subcutaneous Q4H  . insulin aspart  3 Units Subcutaneous Q4H  . insulin glargine  40 Units Subcutaneous Daily  . nystatin   Topical BID   DGU:[id number], acetaminophen, albuterol, LORazepam, ondansetron (ZOFRAN) IV, promethazine  Assessment/Plan: POD#6 s/p trach. Back on vent. No bleeding at this time. Routine trach care.  Will change trach around POD #10.    LOS: 14 days   [name],SUI W 03/10/2013, 11:01 AM

## 2013-03-10 NOTE — Progress Notes (Signed)
SLP Cancellation Note  Patient Details Name: [name] MRN: [id number] DOB: 1955-08-29   Cancelled treatment:        Pt. Remains on vent.  PMSV trials on hold.   Maryjo Rochester T 03/10/2013, 12:59 PM

## 2013-03-10 NOTE — Progress Notes (Addendum)
PULMONARY  / CRITICAL CARE MEDICINE  Name: [name] MRN: [id number] DOB: [birth year]    ADMISSION DATE:  02/24/2013 CONSULTATION DATE:  03/03/2013  REFERRING MD :  Kari Baars PRIMARY SERVICE: PCCM  CHIEF COMPLAINT:  VDRF due to OHS, pulm edema  BRIEF PATIENT DESCRIPTION:  57 y/o female admitted 10/07 to [facility] with hypercarbic failure due to OHS and CHF. Failed extubation 10/11 and difficult re-intubation (6.0 ETT). Transferred to [facility] ICU 10/14 for further eval and mgmt  SIGNIFICANT EVENTS / STUDIES:  10/7 Admitted for hypercarbic resp failure, OHS, CHF.  Intubated 10/9 transient SVT controlled with IV cardizem, transitioned to PO cardizem and coreg 10/11 pt extubated and re-intubated, steroids, difficult re-intubation used # 6 tube 10/14 transferred to [facility] ICU, likely needs trach 10/15 tracheostomy tube placed By ENT  LINES / TUBES: ETT 10/07 >> 10/11, 10/11>>>10/15 Tracheostomy 10/15>>> Foley 10/8>>>  CULTURES: MRSA PCR 10/7 >> NEG 10-17 bc>> 10-17 sputum>>gpc,gnr,gpr>> 10-17 uc>>mul species ANTIBIOTICS: None  SUBJECTIVE:  Patient on vent at night and currently CPAP/PS and tolerating well. Hopes to get to [facility] today. VITAL SIGNS: Temp:  [98 F (36.7 C)-99.3 F (37.4 C)] 98.3 F (36.8 C) (10/21 0758) Pulse Rate:  [50-63] 50 (10/21 0828) Resp:  [13-23] 14 (10/21 0828) BP: (126-161)/(72-92) 154/82 mmHg (10/21 1027) SpO2:  [99 %-100 %] 99 % (10/21 0828) FiO2 (%):  [40 %] 40 % (10/21 1000) Weight:  [384 lb 7.7 oz (174.4 kg)] 384 lb 7.7 oz (174.4 kg) (10/21 0500) HEMODYNAMICS:   VENTILATOR SETTINGS: Vent Mode:  [-] PSV;CPAP FiO2 (%):  [40 %] 40 % Set Rate:  [12 bmp] 12 bmp Vt Set:  [500 mL] 500 mL PEEP:  [5 cmH20] 5 cmH20 Pressure Support:  [8 cmH20-10 cmH20] 10 cmH20 Plateau Pressure:  [22 cmH20-33 cmH20] 22 cmH20 INTAKE / OUTPUT: Intake/Output     10/20 0701 - 10/21 0700 10/21 0701 - 10/22 0700   I.V. (mL/kg) 110 (0.6)    NG/GT 2000 200   Total  Intake(mL/kg) 2110 (12.1) 200 (1.1)   Urine (mL/kg/hr) 1900 (0.5) 350 (0.5)   Total Output 1900 350   Net +210 -150        Stool Occurrence 1 x      PHYSICAL EXAMINATION: Gen: morbidly obese, lying in bed, comfortable on CPAP/PS via vent. HEENT: NCAT, EOMI, MMM, trach with scant old blood ZH:YQMV bradycardia, into 40's noted per  tele monitor, paced rhythm  good S1/S2, no murmur Resp: CTABL, no wheezes, good air movement Abd: SNTND, BS present, no guarding or organomegaly Ext: chronic venous stasis changes on BL LE, 1+ pitting edema BL Neuro: Alert, Rass 0, no focal deficits  LABS:  CBC Recent Labs     03/08/13  1050  03/09/13  0323  03/10/13  0405  WBC  16.0*  13.0*  9.6  HGB  7.3*  8.1*  7.3*  HCT  22.3*  26.2*  23.6*  PLT  291  308  293   Coag's No results found for this basename: APTT, INR,  in the last 72 hours BMET Recent Labs     03/08/13  0425  03/09/13  0323  03/10/13  0405  NA  145  146*  149*  K  3.4*  3.9  4.1  CL  100  100  105  CO2  36*  36*  36*  BUN  102*  86*  73*  CREATININE  2.03*  1.67*  1.50*  GLUCOSE  173*  191*  158*   Electrolytes Recent Labs     03/08/13  0425  03/09/13  0323  03/10/13  0405  CALCIUM  8.5  9.3  8.9  MG   --    --   2.7*  PHOS   --    --   3.8   Sepsis Markers No results found for this basename: LACTICACIDVEN, PROCALCITON, O2SATVEN,  in the last 72 hours ABG Recent Labs     03/08/13  0331  PHART  7.460*  PCO2ART  56.8*  PO2ART  119.0*   Liver Enzymes No results found for this basename: AST, ALT, ALKPHOS, BILITOT, ALBUMIN,  in the last 72 hours Cardiac Enzymes No results found for this basename: TROPONINI, PROBNP,  in the last 72 hours Glucose Recent Labs     03/09/13  1144  03/09/13  1631  03/09/13  1940  03/10/13  0045  03/10/13  0435  03/10/13  0819  GLUCAP  194*  134*  144*  165*  151*  159*   Imaging No results found.  CXR:  See above  ASSESSMENT / PLAN:  PULMONARY A: Acute on  chronic hypercarbic respiratory failure, s/p tracheostomy Obesity hypoventilation syndrome OSA (+ possible central SA) Difficult airway Trach 10/15 P:   - Maintain back on vent on CPAP/PS, reattempt TC later today ( 10/21) as patient tolerates. - Scheduled BDs as ordered. - Suspect she will require trach for life, possibly nocturnal vent depending on central apneas on outpt PSG.  - Dr Suszanne Conners has instructed that trach should not be changed before 10 days (not before 10/25).  CARDIOVASCULAR A:  Acute on chronic diastolic congestive heart failure, neg 11.1 L for admission Paroxsymal SVT Symptomatic bradycardia s/p PPM placement, currently paced HTN P:  - Hold diuresis currently, see below. - Continue clonidine, hydralazine, D/C diltiazem, and decrease coreg to 3.75 mg BID for  bradycardia.  RENAL Lab Results  Component Value Date   CREATININE 1.50* 03/10/2013   CREATININE 1.67* 03/09/2013   CREATININE 2.03* 03/08/2013    Recent Labs Lab 03/08/13 0425 03/09/13 0323 03/10/13 0405  NA 145 146* 149*   A:   AKI, exacerbated by diuresis Compensatory elevation in HCO3 Hypokalemia hypernatremia P:   - Continue to hold lasix, patient is auto-diuresing and renal function is improving. - Monitor BMET  - Correct electrolytes as indicated - Free water flushes, 250 Q4, na increased to 149, free water deficit is 5.1 liters.( 10/21)  GASTROINTESTINAL A:   Morbid obesity P:   - SUP: IV famotidine. - Continue TF.( AT GOAL)  HEMATOLOGIC  Recent Labs  03/09/13 0323 03/10/13 0405  HGB 8.1* 7.3*   A:   Normocytic anemia- likely of chronic disease + losses from trach site P:  - Trend CBC.HGB=7.3 (10/21) - Continue iron supplement.  INFECTIOUS A:   No obvious infection, but progressive leukocytosis P:   - Obtain blood, urine, resp cx 10/17. - Low threshold abx if she changes clinically, no changes 10-19. - WBC continues to downtrend. ( 10/21)  ENDOCRINE CBG (last 3)    Recent Labs  03/10/13 0045 03/10/13 0435 03/10/13 0819  GLUCAP 165* 151* 159*  A:   DM2 P:   - Lantus + SSI, increase Lantus to 40 10-18, note trend.  NEUROLOGIC A:   No acute concerns P:   - PRN fentanyl.  TODAY'S SUMMARY: Placed back on vent for mucous plugging  events on 10/20.  Continue on nocturnal vent. With goal of  TC .  Imagine will need nocturnal vent for life unless looses a significant amount of weight.  Scribed by Kandice Robinsons, RN ACNP Student  [facility] for Canary Brim, NP-C  Increase free water, hold further lasix, continue weaning, will attempt PS as tolerated for now and advance to TC in AM if tolerated.  Will ask cardiology to come in and interrogate the pacer.  Patient seen and examined, agree with above note.  I dictated the care and orders written for this patient under my direction.  Alyson Reedy, MD [phone]

## 2013-03-10 NOTE — Clinical Social Work Note (Signed)
Pt might need vent SNF. CSW coordinating with RNCM to facilitate most appropriate discharge. CSW will complete assessment tomorrow.    Maryclare Labrador, MSW, [facility] Clinical Social Worker [phone]

## 2013-03-11 DIAGNOSIS — Z950 Presence of cardiac pacemaker: Secondary | ICD-10-CM

## 2013-03-11 DIAGNOSIS — J449 Chronic obstructive pulmonary disease, unspecified: Secondary | ICD-10-CM

## 2013-03-11 LAB — CBC
HCT: 22.7 % — ABNORMAL LOW (ref 36.0–46.0)
Hemoglobin: 6.9 g/dL — CL (ref 12.0–15.0)
Hemoglobin: 8 g/dL — ABNORMAL LOW (ref 12.0–15.0)
MCH: 27.9 pg (ref 26.0–34.0)
MCHC: 30.4 g/dL (ref 30.0–36.0)
MCHC: 30.8 g/dL (ref 30.0–36.0)
MCV: 91.9 fL (ref 78.0–100.0)
Platelets: 314 10*3/uL (ref 150–400)
Platelets: 299 10*3/uL (ref 150–400)
RBC: 2.47 MIL/uL — ABNORMAL LOW (ref 3.87–5.11)
RBC: 2.84 MIL/uL — ABNORMAL LOW (ref 3.87–5.11)
RDW: 17.7 % — ABNORMAL HIGH (ref 11.5–15.5)
WBC: 10.3 10*3/uL (ref 4.0–10.5)

## 2013-03-11 LAB — GLUCOSE, CAPILLARY
Glucose-Capillary: 121 mg/dL — ABNORMAL HIGH (ref 70–99)
Glucose-Capillary: 139 mg/dL — ABNORMAL HIGH (ref 70–99)
Glucose-Capillary: 107 mg/dL — ABNORMAL HIGH (ref 70–99)

## 2013-03-11 LAB — BASIC METABOLIC PANEL
BUN: 59 mg/dL — ABNORMAL HIGH (ref 6–23)
CO2: 38 mEq/L — ABNORMAL HIGH (ref 19–32)
CO2: 35 mEq/L — ABNORMAL HIGH (ref 19–32)
Calcium: 9 mg/dL (ref 8.4–10.5)
Calcium: 8.8 mg/dL (ref 8.4–10.5)
Creatinine, Ser: 1.47 mg/dL — ABNORMAL HIGH (ref 0.50–1.10)
GFR calc non Af Amer: 40 mL/min — ABNORMAL LOW (ref >90)
Glucose, Bld: 118 mg/dL — ABNORMAL HIGH (ref 70–99)
Glucose, Bld: 133 mg/dL — ABNORMAL HIGH (ref 70–99)
Potassium: 4.4 mEq/L (ref 3.5–5.1)
Sodium: 150 mEq/L — ABNORMAL HIGH (ref 135–145)
Sodium: 147 mEq/L — ABNORMAL HIGH (ref 135–145)

## 2013-03-11 LAB — ABO/RH: ABO/RH(D): O POS

## 2013-03-11 MED ORDER — SODIUM CHLORIDE 0.9 % IN NEBU
INHALATION_SOLUTION | RESPIRATORY_TRACT | Status: AC
  Administered 2013-03-11: 3 mL
  Filled 2013-03-11: qty 3

## 2013-03-11 MED ORDER — WHITE PETROLATUM GEL
Status: AC
  Filled 2013-03-11: qty 5

## 2013-03-11 MED ORDER — FREE WATER
400.0000 mL | Status: DC
  Administered 2013-03-11 – 2013-03-14 (×16): 400 mL

## 2013-03-11 NOTE — Clinical Social Work Psychosocial (Signed)
Clinical Social Work Department BRIEF PSYCHOSOCIAL ASSESSMENT 03/11/2013  Patient:  [name], [name]     Account Number:  [id number]     Admit date:  02/24/2013  Clinical Social Worker:  Varney Biles  Date/Time:  03/11/2013 04:01 PM  Referred by:  Physician  Date Referred:  03/11/2013 Referred for  SNF Placement   Other Referral:   Interview type:  Patient Other interview type:   CSW met with pt and pt's family at bedside, along with RNCM.    PSYCHOSOCIAL DATA Living Status:  FAMILY Admitted from facility:   Level of care:   Primary support name:  Candie Echevaria Primary support relationship to patient:  SPOUSE Degree of support available:   Great--pt had many family members visiting when [facility] and CSW met with pt (approximately 10 family members).    CURRENT CONCERNS Current Concerns  Post-Acute Placement   Other Concerns:    SOCIAL WORK ASSESSMENT / PLAN RNCM explained that pt and pt's family have some options concerning discharge for pt. RNCM explained that discharge time depends on how well pt does on the vent. RNCM explained that CSW would facilitate discharge to vent SNF if family would like this option, or pt's family can train on the vent and they need to provide 24/7 care for pt if she goes home on the vent. Family members explained that a number of them (3) are nurses/home health workers, and that they feel competent providing this case at home. Family also lives within walking distance of one another, so family members assured RNCM and CSW that someone can always be there to take care of pt. RNCM spoke with pt's sister, who explained that she wishes pt could go to a vent SNF first and then go home to be cared for by family, but that the sister does not think she can convince the rest of the family that this is a good option. After conversing with RNCM, CSW will continue to follow the case and will send clinicals to vent SNFs if the family decides this is an option  they would like to consider.   Assessment/plan status:  Psychosocial Support/Ongoing Assessment of Needs Other assessment/ plan:   Information/referral to community resources:   Potential referral for vent SNF--CSW continues to follow case, though plan is currently for pt to discharge home on vent and be cared for by family members.    PATIENT'S/FAMILY'S RESPONSE TO PLAN OF CARE: Family receptive to [facility] and CSW visit and conversation. Family eager to care for pt and learn how to care for her on the vent. CSW continues to follow case if family decides vent SNF will be a consideration.

## 2013-03-11 NOTE — Clinical Social Work Note (Signed)
CSW called [facility] to inquire about their weight limit. Tammy, admissions coordinator at the vent SNF, says their weight limit is 600 lbs. CSW explained that there will be a meeting today with pt's family, RNCM, and CSW about pt's care and discharge planning, and CSW will inform Tammy if [facility] might be an option for this pt. CSW continues to follow case.    Maryclare Labrador, MSW, [facility] Clinical Social Worker [phone]

## 2013-03-11 NOTE — Progress Notes (Signed)
Subjective: Pt remains on vent overnight. Venting well via trach. No bleeding.  Objective: Vital signs in last 24 hours: Temp:  [98.1 F (36.7 C)-99.5 F (37.5 C)] 98.4 F (36.9 C) (10/21 2250) Pulse Rate:  [50-90] 63 (10/22 0344) Resp:  [12-25] 12 (10/22 0344) BP: (133-166)/(67-93) 163/93 mmHg (10/22 0344) SpO2:  [99 %-100 %] 100 % (10/22 0344) FiO2 (%):  [40 %] 40 % (10/22 0344) Weight:  [178.2 kg (392 lb 13.8 oz)] 178.2 kg (392 lb 13.8 oz) (10/22 0500)  Pt sleeping. Morbidly obese.  On vent. Venting well via trach.  Trach midline, in place. No bleeding.  Recent Labs  03/10/13 0405 03/11/13 0525  WBC 9.6 10.3  HGB 7.3* 6.9*  HCT 23.6* 22.7*  PLT 293 314    Recent Labs  03/10/13 0405 03/11/13 0525  NA 149* 150*  K 4.1 4.1  CL 105 106  CO2 36* 38*  GLUCOSE 158* 118*  BUN 73* 59*  CREATININE 1.50* 1.47*  CALCIUM 8.9 9.0    Medications:  I have reviewed the patient's current medications. Scheduled: . albuterol  2.5 mg Nebulization Q4H  . antiseptic oral rinse  15 mL Mouth Rinse q12n4p  . carvedilol  3.125 mg Oral BID WC  . chlorhexidine  15 mL Mouth Rinse BID  . cloNIDine  0.3 mg Oral TID  . famotidine  20 mg Per Tube BID  . feeding supplement (PRO-STAT SUGAR FREE 64)  30 mL Per Tube TID  . ferrous sulfate  300 mg Per Tube BID WC  . free water  250 mL Per Tube Q4H  . heparin subcutaneous  5,000 Units Subcutaneous Q8H  . hydrALAZINE  100 mg Oral Q6H  . insulin aspart  0-20 Units Subcutaneous Q4H  . insulin aspart  3 Units Subcutaneous Q4H  . insulin glargine  40 Units Subcutaneous Daily  . nystatin   Topical BID   NWG:[id number], acetaminophen, albuterol, LORazepam, ondansetron (ZOFRAN) IV, promethazine  Assessment/Plan: POD#7 s/p trach. Back on vent. No bleeding at this time. Routine trach care.  Will change trach early next week.   LOS: 15 days   Hakop Humbarger,SUI W 03/11/2013, 7:07 AM

## 2013-03-11 NOTE — Progress Notes (Signed)
PULMONARY  / CRITICAL CARE MEDICINE  Name: [name] MRN: [id number] DOB: 29-Dec-1955    ADMISSION DATE:  02/24/2013 CONSULTATION DATE:  03/03/2013  REFERRING MD :  Kari Baars PRIMARY SERVICE: PCCM  CHIEF COMPLAINT:  VDRF due to OHS, pulm edema  BRIEF PATIENT DESCRIPTION:  57 y/o female admitted 10/07 to [facility] with hypercarbic failure due to OHS and CHF. Failed extubation 10/11 and difficult re-intubation (6.0 ETT). Transferred to [facility] ICU 10/14 for further eval and mgmt  SIGNIFICANT EVENTS / STUDIES:  10/7 Admitted for hypercarbic resp failure, OHS, CHF.  Intubated 10/9 transient SVT controlled with IV cardizem, transitioned to PO cardizem and coreg 10/11 pt extubated and re-intubated, steroids, difficult re-intubation used # 6 tube 10/14 transferred to [facility] ICU, likely needs trach 10/15 tracheostomy tube placed By ENT  LINES / TUBES: ETT 10/07 >> 10/11, 10/11>>>10/15 Tracheostomy 10/15>>> Foley 10/8>>>  CULTURES: MRSA PCR 10/7 >> NEG 10-17 bc>> 10-17 sputum>>gpc,gnr,gpr>> 10-17 uc>>mul species  ANTIBIOTICS: None  SUBJECTIVE:  Patient resting comfortably, concerned about possible blood transfusion.  Hgb continues to drop further  VITAL SIGNS: Temp:  [98.1 F (36.7 C)-99.5 F (37.5 C)] 98.9 F (37.2 C) (10/22 0822) Pulse Rate:  [60-90] 68 (10/22 0822) Resp:  [12-25] 15 (10/22 0822) BP: (141-166)/(64-93) 141/64 mmHg (10/22 0822) SpO2:  [100 %] 100 % (10/22 0822) FiO2 (%):  [40 %] 40 % (10/22 0822) Weight:  [392 lb 13.8 oz (178.2 kg)] 392 lb 13.8 oz (178.2 kg) (10/22 0500) HEMODYNAMICS:   VENTILATOR SETTINGS: Vent Mode:  [-] PSV;CPAP FiO2 (%):  [40 %] 40 % Set Rate:  [12 bmp] 12 bmp Vt Set:  [500 mL] 500 mL PEEP:  [5 cmH20] 5 cmH20 Pressure Support:  [10 cmH20] 10 cmH20 Plateau Pressure:  [24 cmH20-29 cmH20] 24 cmH20 INTAKE / OUTPUT: Intake/Output     10/21 0701 - 10/22 0700 10/22 0701 - 10/23 0700   I.V. (mL/kg)     NG/GT 1900    Total  Intake(mL/kg) 1900 (10.7)    Urine (mL/kg/hr) 2550 (0.6) 300 (0.4)   Total Output 2550 300   Net -650 -300        Stool Occurrence 1 x      PHYSICAL EXAMINATION: Gen: morbidly obese, lying in bed, comfortable on CPAP/PS via vent. HEENT: NCAT, EOMI, MMM, trach with minimal discharge CV: Paced rhythm S1 S2, no murmur Resp: Scattered rhonchi throughout.  No wheeze or rales Abd: +BS, belly soft and nontender Ext: chronic venous stasis changes on BL LE, 1+ pitting edema BL Neuro: Alert, Rass 0, no focal deficits  LABS:  CBC Recent Labs     03/09/13  0323  03/10/13  0405  03/11/13  0525  WBC  13.0*  9.6  10.3  HGB  8.1*  7.3*  6.9*  HCT  26.2*  23.6*  22.7*  PLT  308  293  314   Coag's No results found for this basename: APTT, INR,  in the last 72 hours BMET Recent Labs     03/09/13  0323  03/10/13  0405  03/11/13  0525  NA  146*  149*  150*  K  3.9  4.1  4.1  CL  100  105  106  CO2  36*  36*  38*  BUN  86*  73*  59*  CREATININE  1.67*  1.50*  1.47*  GLUCOSE  191*  158*  118*   Electrolytes Recent Labs     03/09/13  0323  03/10/13  0405  03/11/13  0525  CALCIUM  9.3  8.9  9.0  MG   --   2.7*   --   PHOS   --   3.8   --    Sepsis Markers No results found for this basename: LACTICACIDVEN, PROCALCITON, O2SATVEN,  in the last 72 hours ABG No results found for this basename: PHART, PCO2ART, PO2ART,  in the last 72 hours Liver Enzymes No results found for this basename: AST, ALT, ALKPHOS, BILITOT, ALBUMIN,  in the last 72 hours Cardiac Enzymes No results found for this basename: TROPONINI, PROBNP,  in the last 72 hours Glucose Recent Labs     03/10/13  1218  03/10/13  1643  03/10/13  1959  03/10/13  2252  03/11/13  0429  03/11/13  0805  GLUCAP  183*  144*  123*  139*  105*  121*   Imaging Dg Chest Port 1 View  03/10/2013   CLINICAL DATA:  Followup evaluation for pulmonary edema.  EXAM: PORTABLE CHEST - 1 VIEW  COMPARISON:  CHEST x-ray 03/07/2013.   FINDINGS: Tracheostomy tube in position with tip approximately 6.7 cm above the carinal. Left-sided pacemaker device in place with lead tips projecting over the expected location of the right atrial appendage and right ventricular apex. Lung volumes are slightly low. No definite acute consolidative airspace disease. Blunting of left costophrenic sulcus is favored to be positional and related to underpenetration of the film, although a trace left pleural effusion is difficult to entirely exclude. Cephalization of the pulmonary vasculature, without frank pulmonary edema. Mild cardiomegaly. No pneumothorax. Atherosclerosis in the thoracic aorta. The patient is rotated to the left on today's exam, resulting in distortion of the mediastinal contours and reduced diagnostic sensitivity and specificity for mediastinal pathology.  IMPRESSION: 1. Low lung volumes with pulmonary venous congestion and cardiomegaly, but no frank pulmonary edema at this time. 2. Atherosclerosis. 3. Support apparatus, as above.   Electronically Signed   By: Trudie Reed M.D.   On: 03/10/2013 15:40    CXR:  NNF, previous films show small lung volumes  ASSESSMENT / PLAN:  PULMONARY A: Acute on chronic hypercarbic respiratory failure, s/p tracheostomy Obesity hypoventilation syndrome OSA (+ possible central SA) Difficult airway Trach 10/15 P:   - attempt trach collar as indicated - Scheduled BDs as ordered. - mandatory nocturnal vent use -set up outpatient polysomnograpy - DO NOT change trach before 10/25  CARDIOVASCULAR A:  Acute on chronic diastolic CHF, neg 11.1 L for admission Paroxsymal SVT Symptomatic bradycardia s/p PPM placement, currently paced HTN P:  - Hold diuresis currently - Continue clonidine, hydralazine, and coreg at 3.75 mg BID  RENAL Lab Results  Component Value Date   CREATININE 1.47* 03/11/2013   CREATININE 1.50* 03/10/2013   CREATININE 1.67* 03/09/2013    Recent Labs Lab 03/09/13 0323  03/10/13 0405 03/11/13 0525  NA 146* 149* 150*   A:   AKI, exacerbated by diuresis - creatinine trending down Compensatory elevation in HCO3 Hypokalemia - resolved 10/22 Hypernatremia P:   - Continue to hold lasix, patient remains net negative on her own - Monitor BMET  - Correct electrolytes as indicated - increase free water to 400 mL q4hrs  GASTROINTESTINAL A:   Morbid obesity P:   - SUP: IV famotidine. - Continue TF at goal  HEMATOLOGIC  Recent Labs  03/10/13 0405 03/11/13 0525  HGB 7.3* 6.9*   A:   Normocytic anemia- likely of chronic disease + losses from trach site  P:  - Trend CBC.HGB=7.3 (10/21) - Continue iron supplement. - type and cross - transfuse 1 unit PRBC  INFECTIOUS A:   No obvious infection, but progressive leukocytosis P:   - WBC continues to downtrend    ENDOCRINE CBG (last 3)   Recent Labs  03/10/13 2252 03/11/13 0429 03/11/13 0805  GLUCAP 139* 105* 121*  A:   DM2 P:   - Lantus + SSI  NEUROLOGIC A:   No acute concerns P:   - PRN fentanyl -avoid benzos and use of opiods prior to trach collar  TODAY'S SUMMARY: Patient's hemoglobin has dropped to 6.9 today.  Will give 1 unit PRBC and will repeat cbc this afternoon.  Patient also remains significantly hypernatremic despite the increase in free water.  Will increase free water, but must remain mindful of CHF and not fluid overload patient.  Kidney function continues to improve.  Will attempt to wean to TC as tolerated.  Avoid the use of benzos and sedating agents as this may worsen hypercarbic respiratory failure.  Mandatory nocturnal use of vent until PSG done.    Courtney Forcucci, Student-PA  Will transfuse one unit pRBC, repeat Hg this afternoon.   Hypernatremia remains an issue, will increase free water as ordered.  If evidence of fluid overload then will add lasix.  Trach to be changed by ENT next week.  Will recheck labs in AM.  Patient seen and examined, agree with  above note.  I dictated the care and orders written for this patient under my direction.  Alyson Reedy, MD [phone]

## 2013-03-12 LAB — GLUCOSE, CAPILLARY
Glucose-Capillary: 108 mg/dL — ABNORMAL HIGH (ref 70–99)
Glucose-Capillary: 157 mg/dL — ABNORMAL HIGH (ref 70–99)
Glucose-Capillary: 106 mg/dL — ABNORMAL HIGH (ref 70–99)
Glucose-Capillary: 118 mg/dL — ABNORMAL HIGH (ref 70–99)
Glucose-Capillary: 153 mg/dL — ABNORMAL HIGH (ref 70–99)

## 2013-03-12 LAB — TYPE AND SCREEN
ABO/RH(D): O POS
Antibody Screen: NEGATIVE
Unit division: 0

## 2013-03-12 LAB — CBC
HCT: 25.8 % — ABNORMAL LOW (ref 36.0–46.0)
Hemoglobin: 8 g/dL — ABNORMAL LOW (ref 12.0–15.0)
MCV: 92.5 fL (ref 78.0–100.0)
Platelets: 303 10*3/uL (ref 150–400)
RBC: 2.79 MIL/uL — ABNORMAL LOW (ref 3.87–5.11)
RDW: 17.2 % — ABNORMAL HIGH (ref 11.5–15.5)
WBC: 11.8 10*3/uL — ABNORMAL HIGH (ref 4.0–10.5)

## 2013-03-12 LAB — CULTURE, BLOOD (ROUTINE X 2)
Culture: NO GROWTH
Culture: NO GROWTH

## 2013-03-12 NOTE — Progress Notes (Signed)
SLP Cancellation Note  Patient Details Name: [name] MRN: [id number] DOB: Oct 14, 1955   Cancelled treatment:       Reason Eval/Treat Not Completed: Medical issues which prohibited therapy. Patient  on vent. Plan to allow to rest/recover over the weekend and f/u for possible PMSV trials if stable off vent on Monday 10/27.  Ferdinand Lango MA, CCC-SLP [phone]   Ferdinand Lango Meryl 03/12/2013, 10:53 AM

## 2013-03-12 NOTE — Progress Notes (Signed)
PULMONARY  / CRITICAL CARE MEDICINE  Name: [name] MRN: [id number] DOB: 11-Apr-1956    ADMISSION DATE:  02/24/2013 CONSULTATION DATE:  03/03/2013  REFERRING MD :  Kari Baars PRIMARY SERVICE: PCCM  CHIEF COMPLAINT:  VDRF due to OHS, pulm edema  BRIEF PATIENT DESCRIPTION:  57 y/o female admitted 10/07 to [facility] with hypercarbic failure due to OHS and CHF. Failed extubation 10/11 and difficult re-intubation (6.0 ETT). Transferred to [facility] ICU 10/14 for further eval and mgmt  SIGNIFICANT EVENTS / STUDIES:  10/7 Admitted for hypercarbic resp failure, OHS, CHF.  Intubated 10/9 transient SVT controlled with IV cardizem, transitioned to PO cardizem and coreg 10/11 pt extubated and re-intubated, steroids, difficult re-intubation used # 6 tube 10/14 transferred to [facility] ICU, likely needs trach 10/15 tracheostomy tube placed By ENT  LINES / TUBES: ETT 10/07 >> 10/11, 10/11>>>10/15 Tracheostomy 10/15>>> Foley 10/8>>>  CULTURES: MRSA PCR 10/7 >> NEG 10-17 bc>> 10-17 sputum>>gpc,gnr,gpr>> 10-17 uc>>mul species  ANTIBIOTICS: None  SUBJECTIVE:  Patient resting comfortably, concerned about possible blood transfusion.  Hgb continues to drop further  VITAL SIGNS: Temp:  [98 F (36.7 C)-99.8 F (37.7 C)] 99.3 F (37.4 C) (10/23 1249) Pulse Rate:  [58-72] 63 (10/23 1249) Resp:  [12-23] 23 (10/23 1249) BP: (137-175)/(64-101) 147/75 mmHg (10/23 1249) SpO2:  [97 %-100 %] 100 % (10/23 1249) FiO2 (%):  [40 %] 40 % (10/23 1249) HEMODYNAMICS:   VENTILATOR SETTINGS: Vent Mode:  [-] PSV;CPAP FiO2 (%):  [40 %] 40 % Set Rate:  [12 bmp] 12 bmp Vt Set:  [500 mL] 500 mL PEEP:  [5 cmH20] 5 cmH20 Pressure Support:  [12 cmH20] 12 cmH20 Plateau Pressure:  [22 cmH20-24 cmH20] 24 cmH20 INTAKE / OUTPUT: Intake/Output     10/22 0701 - 10/23 0700 10/23 0701 - 10/24 0700   Blood 375    NG/GT 1700 780   Total Intake(mL/kg) 2075 (11.6) 780 (4.4)   Urine (mL/kg/hr) 2425 (0.6) 850 (0.8)   Total  Output 2425 850   Net -350 -70          PHYSICAL EXAMINATION: Gen: morbidly obese, lying in bed, comfortable on CPAP/PS via vent. HEENT: NCAT, EOMI, MMM, trach with minimal discharge CV: Paced rhythm S1 S2, no murmur Resp: Scattered rhonchi throughout.  No wheeze or rales Abd: +BS, belly soft and nontender Ext: chronic venous stasis changes on BL LE, 1+ pitting edema BL Neuro: Alert, Rass 0, no focal deficits  LABS:  CBC Recent Labs     03/11/13  0525  03/11/13  1920  03/12/13  0500  WBC  10.3  11.8*  11.8*  HGB  6.9*  8.0*  8.0*  HCT  22.7*  26.0*  25.8*  PLT  314  299  303   Coag's No results found for this basename: APTT, INR,  in the last 72 hours BMET Recent Labs     03/10/13  0405  03/11/13  0525  03/11/13  1920  NA  149*  150*  147*  K  4.1  4.1  4.4  CL  105  106  105  CO2  36*  38*  35*  BUN  73*  59*  53*  CREATININE  1.50*  1.47*  1.43*  GLUCOSE  158*  118*  133*   Electrolytes Recent Labs     03/10/13  0405  03/11/13  0525  03/11/13  1920  CALCIUM  8.9  9.0  8.8  MG  2.7*   --    --  PHOS  3.8   --    --    Sepsis Markers No results found for this basename: LACTICACIDVEN, PROCALCITON, O2SATVEN,  in the last 72 hours ABG No results found for this basename: PHART, PCO2ART, PO2ART,  in the last 72 hours Liver Enzymes No results found for this basename: AST, ALT, ALKPHOS, BILITOT, ALBUMIN,  in the last 72 hours Cardiac Enzymes No results found for this basename: TROPONINI, PROBNP,  in the last 72 hours Glucose Recent Labs     03/11/13  1614  03/11/13  2025  03/12/13  0027  03/12/13  0454  03/12/13  0819  03/12/13  1230  GLUCAP  107*  128*  111*  108*  140*  157*   Imaging Dg Chest Port 1 View  03/10/2013   CLINICAL DATA:  Followup evaluation for pulmonary edema.  EXAM: PORTABLE CHEST - 1 VIEW  COMPARISON:  CHEST x-ray 03/07/2013.  FINDINGS: Tracheostomy tube in position with tip approximately 6.7 cm above the carinal. Left-sided  pacemaker device in place with lead tips projecting over the expected location of the right atrial appendage and right ventricular apex. Lung volumes are slightly low. No definite acute consolidative airspace disease. Blunting of left costophrenic sulcus is favored to be positional and related to underpenetration of the film, although a trace left pleural effusion is difficult to entirely exclude. Cephalization of the pulmonary vasculature, without frank pulmonary edema. Mild cardiomegaly. No pneumothorax. Atherosclerosis in the thoracic aorta. The patient is rotated to the left on today's exam, resulting in distortion of the mediastinal contours and reduced diagnostic sensitivity and specificity for mediastinal pathology.  IMPRESSION: 1. Low lung volumes with pulmonary venous congestion and cardiomegaly, but no frank pulmonary edema at this time. 2. Atherosclerosis. 3. Support apparatus, as above.   Electronically Signed   By: Trudie Reed M.D.   On: 03/10/2013 15:40    CXR:  NNF, previous films show small lung volumes  ASSESSMENT / PLAN:  PULMONARY A: Acute on chronic hypercarbic respiratory failure, s/p tracheostomy Obesity hypoventilation syndrome OSA (+ possible central SA) Difficult airway Trach 10/15 P:   - Attempt trach collar as indicated. - Scheduled BDs as ordered. - Mandatory nocturnal vent use. - Arrange for home vent. - DO NOT change trach before 10/25.  CARDIOVASCULAR A:  Acute on chronic diastolic CHF, neg 11.1 L for admission Paroxsymal SVT Symptomatic bradycardia s/p PPM placement, currently paced HTN P:  - Hold diuresis currently - Continue clonidine, hydralazine, and coreg at 3.75 mg BID  RENAL Lab Results  Component Value Date   CREATININE 1.43* 03/11/2013   CREATININE 1.47* 03/11/2013   CREATININE 1.50* 03/10/2013    Recent Labs Lab 03/10/13 0405 03/11/13 0525 03/11/13 1920  NA 149* 150* 147*   A:   AKI, exacerbated by diuresis - creatinine  trending down Compensatory elevation in HCO3 Hypokalemia - resolved 10/22 Hypernatremia P:   - Continue to hold lasix, patient remains net negative on her own - Monitor BMET  - Correct electrolytes as indicated - Continue free water to 400 mL q4hrs  GASTROINTESTINAL A:   Morbid obesity P:   - SUP: IV famotidine. - Continue TF at goal  HEMATOLOGIC  Recent Labs  03/11/13 1920 03/12/13 0500  HGB 8.0* 8.0*   A:   Normocytic anemia- likely of chronic disease + losses from trach site P:  - Trend CBC.HGB=7.3 (10/21) - Continue iron supplement.  INFECTIOUS A:   No obvious infection, but progressive leukocytosis P:   -  WBC continues to downtrend  ENDOCRINE CBG (last 3)   Recent Labs  03/12/13 0454 03/12/13 0819 03/12/13 1230  GLUCAP 108* 140* 157*  A:   DM2 P:   - Lantus + SSI  NEUROLOGIC A:   No acute concerns P:   - PRN fentanyl - Avoid benzos and use of opiods prior to trach collar  Begin family training for home vent, once Hg is stable will d/c home with home vent.  Alyson Reedy, M.D. [facility] Pulmonary/Critical Care Medicine. Pager: [phone]. After hours pager: [phone].

## 2013-03-13 LAB — BASIC METABOLIC PANEL
CO2: 35 mEq/L — ABNORMAL HIGH (ref 19–32)
Chloride: 100 mEq/L (ref 96–112)
GFR calc non Af Amer: 33 mL/min — ABNORMAL LOW (ref >90)
Glucose, Bld: 145 mg/dL — ABNORMAL HIGH (ref 70–99)
Potassium: 4 mEq/L (ref 3.5–5.1)
Sodium: 142 mEq/L (ref 135–145)

## 2013-03-13 LAB — GLUCOSE, CAPILLARY
Glucose-Capillary: 139 mg/dL — ABNORMAL HIGH (ref 70–99)
Glucose-Capillary: 151 mg/dL — ABNORMAL HIGH (ref 70–99)
Glucose-Capillary: 173 mg/dL — ABNORMAL HIGH (ref 70–99)

## 2013-03-13 LAB — CBC
Hemoglobin: 7.2 g/dL — ABNORMAL LOW (ref 12.0–15.0)
MCHC: 31.4 g/dL (ref 30.0–36.0)
RBC: 2.51 MIL/uL — ABNORMAL LOW (ref 3.87–5.11)
WBC: 12.5 10*3/uL — ABNORMAL HIGH (ref 4.0–10.5)

## 2013-03-13 LAB — PHOSPHORUS: Phosphorus: 3.9 mg/dL (ref 2.3–4.6)

## 2013-03-13 LAB — HEMOGLOBIN AND HEMATOCRIT, BLOOD: HCT: 23.4 % — ABNORMAL LOW (ref 36.0–46.0)

## 2013-03-13 NOTE — Progress Notes (Signed)
Subjective: Pt remains on vent. Venting well via trach. No bleeding.  Objective: Vital signs in last 24 hours: Temp:  [98.7 F (37.1 C)-101.7 F (38.7 C)] 99.4 F (37.4 C) (10/24 1133) Pulse Rate:  [57-67] 65 (10/24 1133) Resp:  [16-28] 18 (10/24 1133) BP: (124-167)/(61-89) 146/74 mmHg (10/24 1133) SpO2:  [100 %] 100 % (10/24 1133) FiO2 (%):  [40 %] 40 % (10/24 1156)  Pt sleeping. Morbidly obese.  On vent. Venting well via trach. Good O2 sat. Trach midline, in place. No bleeding.   Recent Labs  03/12/13 0500 03/13/13 0610  WBC 11.8* 12.5*  HGB 8.0* 7.2*  HCT 25.8* 22.9*  PLT 303 292    Recent Labs  03/11/13 1920 03/13/13 0610  NA 147* 142  K 4.4 4.0  CL 105 100  CO2 35* 35*  GLUCOSE 133* 145*  BUN 53* 49*  CREATININE 1.43* 1.66*  CALCIUM 8.8 8.5    Medications:  I have reviewed the patient's current medications. Scheduled: . albuterol  2.5 mg Nebulization Q4H  . antiseptic oral rinse  15 mL Mouth Rinse q12n4p  . carvedilol  3.125 mg Oral BID WC  . chlorhexidine  15 mL Mouth Rinse BID  . cloNIDine  0.3 mg Oral TID  . famotidine  20 mg Per Tube BID  . feeding supplement (PRO-STAT SUGAR FREE 64)  30 mL Per Tube TID  . ferrous sulfate  300 mg Per Tube BID WC  . free water  400 mL Per Tube Q4H  . heparin subcutaneous  5,000 Units Subcutaneous Q8H  . hydrALAZINE  100 mg Oral Q6H  . insulin aspart  0-20 Units Subcutaneous Q4H  . insulin aspart  3 Units Subcutaneous Q4H  . insulin glargine  40 Units Subcutaneous Daily  . nystatin   Topical BID   ZOX:[id number], acetaminophen, albuterol, LORazepam, ondansetron (ZOFRAN) IV, promethazine  Assessment/Plan: POD#9 s/p trach. Back on vent. No bleeding at this time. Routine trach care.  Will change trach early next week.   LOS: 17 days   March Joos,SUI W 03/13/2013, 1:59 PM

## 2013-03-13 NOTE — Clinical Social Work Note (Signed)
CSW offered to send pt's clinicals to facilities in [facility] in [place] and [place] in [place]), and pt refused stating she will be going home. CSW expressed understanding of pt's choice and will continue to routinely check in with pt and offer CSW services if desired by pt.    Maryclare Labrador, MSW, [facility] Clinical Social Worker [phone]

## 2013-03-13 NOTE — Progress Notes (Signed)
**Note De-identified [name] Obfuscation** RT note: sputum collected and sent to lab. 

## 2013-03-13 NOTE — Progress Notes (Signed)
NUTRITION FOLLOW UP  Intervention:    Continue current EN regimen RD to follow for nutrition care plan  Nutrition Dx:   Inadequate oral intake related to inability to eat as evidenced by NPO status, ongoing  New Goal:   Enteral nutrition to provide 60-70% of estimated calorie needs (22-25 kcals/kg ideal body weight) and 100% of estimated protein needs, based on ASPEN guidelines for permissive underfeeding in critically ill obese individuals, met  Monitor:   EN regimen & tolerance, respiratory status, weight, labs, I/O's  Assessment:   Patient admitted 10/7 to [facility] with hypercarbic failure due to OHS and CHF. Failed extubation 10/11 and difficult re-intubation. Transferred from [facility] to [facility] on 10/14. S/P tracheostomy on 10/15.  Patient is currently on ventilator support -- trach MV: 7.8 Temp: 37.4  Glucerna 1.2 formula infusing at goal rate of 50 ml/hr via NGT with Prostat liquid protein 30 ml TID providing 1740 kcals, 117 gm protein, 966 ml of free water.  Free water flushes at 400 ml every 4 hours.  Passy-Muir Speaking Valve -- Evaluation completed 10/17.  Height: Ht Readings from Last 1 Encounters:  03/03/13 5' (1.524 m)    Weight Status:   Wt Readings from Last 1 Encounters:  03/11/13 392 lb 13.8 oz (178.2 kg)    Body mass index is 76.73 kg/(m^2).  Re-estimated needs:  Kcal: 2470 Protein: 110-120 gm Fluid: 2.4 L  Skin: neck incision   Diet Order: NPO   Intake/Output Summary (Last 24 hours) at 03/13/13 1439 Last data filed at 03/13/13 1219  Gross per 24 hour  Intake    550 ml  Output   2375 ml  Net  -1825 ml    Labs:   Recent Labs Lab 03/10/13 0405 03/11/13 0525 03/11/13 1920 03/13/13 0610  NA 149* 150* 147* 142  K 4.1 4.1 4.4 4.0  CL 105 106 105 100  CO2 36* 38* 35* 35*  BUN 73* 59* 53* 49*  CREATININE 1.50* 1.47* 1.43* 1.66*  CALCIUM 8.9 9.0 8.8 8.5  MG 2.7*  --   --  2.2  PHOS 3.8  --   --  3.9  GLUCOSE 158* 118* 133* 145*    CBG (last  3)   Recent Labs  03/13/13 0402 03/13/13 0812 03/13/13 1120  GLUCAP 141* 139* 151*    Scheduled Meds: . albuterol  2.5 mg Nebulization Q4H  . antiseptic oral rinse  15 mL Mouth Rinse q12n4p  . carvedilol  3.125 mg Oral BID WC  . chlorhexidine  15 mL Mouth Rinse BID  . cloNIDine  0.3 mg Oral TID  . famotidine  20 mg Per Tube BID  . feeding supplement (PRO-STAT SUGAR FREE 64)  30 mL Per Tube TID  . ferrous sulfate  300 mg Per Tube BID WC  . free water  400 mL Per Tube Q4H  . heparin subcutaneous  5,000 Units Subcutaneous Q8H  . hydrALAZINE  100 mg Oral Q6H  . insulin aspart  0-20 Units Subcutaneous Q4H  . insulin aspart  3 Units Subcutaneous Q4H  . insulin glargine  40 Units Subcutaneous Daily  . nystatin   Topical BID    Continuous Infusions: . sodium chloride 10 mL/hr at 03/09/13 2255  . feeding supplement (GLUCERNA 1.2 CAL) 1,000 mL (03/12/13 2126)    Maureen Chatters, RD, LDN Pager #: [phone] After-Hours Pager #: [phone]

## 2013-03-13 NOTE — Progress Notes (Signed)
PULMONARY  / CRITICAL CARE MEDICINE  Name: [name] MRN: [id number] DOB: [birth year]    ADMISSION DATE:  02/24/2013 CONSULTATION DATE:  03/03/2013  REFERRING MD :  Kari Baars PRIMARY SERVICE: PCCM  CHIEF COMPLAINT:  VDRF due to OHS, pulm edema  BRIEF PATIENT DESCRIPTION:  57 y/o female admitted 10/07 to [facility] with hypercarbic failure due to OHS and CHF. Failed extubation 10/11 and difficult re-intubation (6.0 ETT). Transferred to [facility] ICU 10/14 for further eval and mgmt  SIGNIFICANT EVENTS / STUDIES:  10/7 Admitted for hypercarbic resp failure, OHS, CHF.  Intubated 10/9 transient SVT controlled with IV cardizem, transitioned to PO cardizem and coreg 10/11 pt extubated and re-intubated, steroids, difficult re-intubation used # 6 tube 10/14 transferred to [facility] ICU, likely needs trach 10/15 tracheostomy tube placed By ENT  LINES / TUBES: ETT 10/07 >> 10/11, 10/11>>>10/15 Tracheostomy 10/15>>> Foley 10/8>>>  CULTURES: MRSA PCR 10/7 >> NEG 10-17 bc>> 10-17 sputum>>gpc,gnr,gpr>> 10-17 uc>>mul species 10/24 sputum>>>  ANTIBIOTICS: None  SUBJECTIVE:  Patient resting comfortably complains of headache.  States that she wants to go home and does not want to go to a SNF.  Patient's brother states that he does NOT think that patient's daughters are qualified to take care of her.    VITAL SIGNS: Temp:  [98.7 F (37.1 C)-101.7 F (38.7 C)] 100.4 F (38 C) (10/24 0843) Pulse Rate:  [57-65] 62 (10/24 0843) Resp:  [16-28] 16 (10/24 0843) BP: (124-167)/(61-89) 138/80 mmHg (10/24 0843) SpO2:  [100 %] 100 % (10/24 0400) FiO2 (%):  [40 %] 40 % (10/24 0843) HEMODYNAMICS:   VENTILATOR SETTINGS: Vent Mode:  [-] PSV;CPAP FiO2 (%):  [40 %] 40 % Set Rate:  [12 bmp] 12 bmp Vt Set:  [500 mL] 500 mL PEEP:  [5 cmH20] 5 cmH20 Pressure Support:  [12 cmH20] 12 cmH20 INTAKE / OUTPUT: Intake/Output     10/23 0701 - 10/24 0700 10/24 0701 - 10/25 0700   Blood     NG/GT 1330    Total  Intake(mL/kg) 1330 (7.5)    Urine (mL/kg/hr) 2550 (0.6) 350 (0.5)   Total Output 2550 350   Net -1220 -350          PHYSICAL EXAMINATION: Gen: morbidly obese, lying in bed, comfortable on CPAP/PS via vent. HEENT: NCAT, EOMI, MMM, trach with minimal discharge CV: Paced rhythm S1 S2, no murmur Resp: Scattered rhonchi throughout.  No wheeze or rales Abd: +BS, belly soft and nontender Ext: chronic venous stasis changes on BL LE, 1+ pitting edema BL Neuro: Alert, Rass 0, no focal deficits  LABS:  CBC Recent Labs     03/11/13  1920  03/12/13  0500  03/13/13  0610  WBC  11.8*  11.8*  12.5*  HGB  8.0*  8.0*  7.2*  HCT  26.0*  25.8*  22.9*  PLT  299  303  292   Coag's No results found for this basename: APTT, INR,  in the last 72 hours BMET Recent Labs     03/11/13  0525  03/11/13  1920  03/13/13  0610  NA  150*  147*  142  K  4.1  4.4  4.0  CL  106  105  100  CO2  38*  35*  35*  BUN  59*  53*  49*  CREATININE  1.47*  1.43*  1.66*  GLUCOSE  118*  133*  145*   Electrolytes Recent Labs     03/11/13  0525  03/11/13  1920  03/13/13  0610  CALCIUM  9.0  8.8  8.5  MG   --    --   2.2  PHOS   --    --   3.9   Sepsis Markers No results found for this basename: LACTICACIDVEN, PROCALCITON, O2SATVEN,  in the last 72 hours ABG No results found for this basename: PHART, PCO2ART, PO2ART,  in the last 72 hours Liver Enzymes No results found for this basename: AST, ALT, ALKPHOS, BILITOT, ALBUMIN,  in the last 72 hours Cardiac Enzymes No results found for this basename: TROPONINI, PROBNP,  in the last 72 hours Glucose Recent Labs     03/12/13  1230  03/12/13  1742  03/12/13  2043  03/12/13  2157  03/12/13  2348  03/13/13  0402  GLUCAP  157*  106*  118*  153*  164*  141*   Imaging No results found.  CXR:  NNF, previous films show small lung volumes  ASSESSMENT / PLAN:  PULMONARY A: Acute on chronic hypercarbic respiratory failure, s/p tracheostomy Obesity  hypoventilation syndrome OSA (+ possible central SA) Difficult airway Trach 10/15 P:   - Trach collar as indicated, will begin in AM with TC trials and mandatory vent at night. - Scheduled BDs as ordered. - Mandatory nocturnal vent use. - Arrange for home vent - DO NOT change trach before 10/25.  CARDIOVASCULAR A:  Acute on chronic diastolic CHF, neg 11.1 L for admission Paroxsymal SVT Symptomatic bradycardia s/p PPM placement, currently paced HTN Likely diuretic stage of ATN. P:  - Hold diuresis currently - Continue clonidine, hydralazine, and coreg at 3.75 mg BID  RENAL Lab Results  Component Value Date   CREATININE 1.66* 03/13/2013   CREATININE 1.43* 03/11/2013   CREATININE 1.47* 03/11/2013   Recent Labs Lab 03/11/13 0525 03/11/13 1920 03/13/13 0610  NA 150* 147* 142   A:   AKI, exacerbated by diuresis - creatinine trending down Compensatory elevation in HCO3 Hypokalemia - resolved 10/22 Hypernatremia P:   - Continue to hold lasix, patient remains net negative on her own - Monitor BMET  - Correct electrolytes as indicated - Continue free water to 250 mL q6hrs - If continues to be auto-diuresing by AM (>1200 ml negative) will consider giving IVF back specially if Cr worsens.  GASTROINTESTINAL A:   Morbid obesity P:   - SUP: IV famotidine. - Continue TF at goal.  HEMATOLOGIC  Recent Labs  03/12/13 0500 03/13/13 0610  HGB 8.0* 7.2*   A:   Normocytic anemia- likely of chronic disease + losses from trach site P:  - Trend CBC. - Hgb down to 7.2, H&H this afternoon may need another unit of PRBCs if Hgb<7. - Continue iron supplement.  INFECTIOUS A:   No obvious infection, but progressive leukocytosis P:   - WBC continues to downtrend  ENDOCRINE CBG (last 3)   Recent Labs  03/12/13 2157 03/12/13 2348 03/13/13 0402  GLUCAP 153* 164* 141*  A:   DM2 P:   - Lantus + SSI.  NEUROLOGIC A:   No acute concerns P:   - PRN fentanyl. -  Avoid benzos and use of opiods prior to trach collar.  Carley Hammed Physician Assistant Student 03/13/13, 11:10 am  Patient continues to have downward trending Hgb.  Will check another H&H this afternoon.  If below 7 patient will require another unit of PRBCs.  Patient is also adament about returning home after this hospital stay.  Discussed with patient and  the patient's brother who is an EMT about the potential of her daughters (CNAs) to be able to take care of her adequately at home.  Brother did not feel confident that children would be able to care for her.  I asked her to consider a short term SNF stay and to speak with social workers again on this subject. Watch for dehydration from diuretic stage of ATN.  BUN:Cr ratio dropped rather than increased so will not supplement fluid at this time.  Trach aspirate sent for culture.  No abx for now.  Alyson Reedy, M.D. [facility] Pulmonary/Critical Care Medicine. Pager: [phone]. After hours pager: [phone].

## 2013-03-14 DIAGNOSIS — D509 Iron deficiency anemia, unspecified: Secondary | ICD-10-CM

## 2013-03-14 LAB — BASIC METABOLIC PANEL
BUN: 49 mg/dL — ABNORMAL HIGH (ref 6–23)
Chloride: 97 mEq/L (ref 96–112)
GFR calc Af Amer: 36 mL/min — ABNORMAL LOW (ref >90)
Potassium: 4.1 mEq/L (ref 3.5–5.1)

## 2013-03-14 LAB — CBC
HCT: 22.5 % — ABNORMAL LOW (ref 36.0–46.0)
Hemoglobin: 7.1 g/dL — ABNORMAL LOW (ref 12.0–15.0)
Platelets: 292 10*3/uL (ref 150–400)
RDW: 16.9 % — ABNORMAL HIGH (ref 11.5–15.5)
WBC: 14.1 10*3/uL — ABNORMAL HIGH (ref 4.0–10.5)

## 2013-03-14 LAB — GLUCOSE, CAPILLARY
Glucose-Capillary: 145 mg/dL — ABNORMAL HIGH (ref 70–99)
Glucose-Capillary: 157 mg/dL — ABNORMAL HIGH (ref 70–99)
Glucose-Capillary: 170 mg/dL — ABNORMAL HIGH (ref 70–99)
Glucose-Capillary: 159 mg/dL — ABNORMAL HIGH (ref 70–99)
Glucose-Capillary: 129 mg/dL — ABNORMAL HIGH (ref 70–99)

## 2013-03-14 NOTE — Progress Notes (Signed)
Received call from [place] at [facility] to inform me of a 12 beat run of ?VT. Strip faxed to [facility] for evaluation and possible treatment.

## 2013-03-14 NOTE — Progress Notes (Signed)
PULMONARY  / CRITICAL CARE MEDICINE  Name: [name] MRN: [id number] DOB: 06/11/1955    ADMISSION DATE:  02/24/2013 CONSULTATION DATE:  03/03/2013  REFERRING MD :  Kari Baars PRIMARY SERVICE: PCCM  CHIEF COMPLAINT:  VDRF due to OHS, pulm edema  BRIEF PATIENT DESCRIPTION:  55 yobf  admitted 10/07 to [facility] with hypercarbic failure due to OHS and CHF. Failed extubation 10/11 and difficult re-intubation (6.0 ETT). Transferred to [facility] ICU 10/14 for further eval and mgmt  SIGNIFICANT EVENTS / STUDIES:  10/7 Admitted for hypercarbic resp failure, OHS, CHF.  Intubated 10/9 transient SVT controlled with IV cardizem, transitioned to PO cardizem and coreg 10/11 pt extubated and re-intubated, steroids, difficult re-intubation used # 6 tube 10/14 transferred to [facility] ICU, likely needs trach 10/15 tracheostomy tube placed By ENT  LINES / TUBES: ETT 10/07 >> 10/11, 10/11>>>10/15 Tracheostomy(Teoh) 10/15>>> Foley 10/8>>>  CULTURES: MRSA PCR 10/7 >> neg 10-17 bc>> neg 10-17 sputum>>gpc,gnr,gpr> mod strep not Group A 10-17 uc>>mul species 10/24 sputum> few gnr >>> 10/25 trach site >>>  ANTIBIOTICS: None  SUBJECTIVE:  Patient resting comfortably  Wants to know why she's on the vent    VITAL SIGNS: Temp:  [99.4 F (37.4 C)-101.6 F (38.7 C)] 100.5 F (38.1 C) (10/25 0835) Pulse Rate:  [59-74] 65 (10/25 0835) Resp:  [16-26] 19 (10/25 0835) BP: (102-160)/(55-103) 141/64 mmHg (10/25 0835) SpO2:  [96 %-100 %] 100 % (10/25 0855) FiO2 (%):  [40 %] 40 % (10/25 0855) Weight:  [397 lb 0.8 oz (180.1 kg)] 397 lb 0.8 oz (180.1 kg) (10/25 0600) HEMODYNAMICS:   VENTILATOR SETTINGS: Vent Mode:  [-] PRVC FiO2 (%):  [40 %] 40 % Set Rate:  [12 bmp] 12 bmp Vt Set:  [500 mL] 500 mL PEEP:  [5 cmH20] 5 cmH20 Pressure Support:  [12 cmH20] 12 cmH20 Plateau Pressure:  [25 cmH20-33 cmH20] 28 cmH20 INTAKE / OUTPUT: Intake/Output     10/24 0701 - 10/25 0700 10/25 0701 - 10/26 0700   NG/GT  4340    Total Intake(mL/kg) 4340 (24.1)    Urine (mL/kg/hr) 1550 (0.4)    Total Output 1550     Net +2790            PHYSICAL EXAMINATION: Gen: morbidly obese, lying in bed, comfortable on  vent. HEENT: NCAT, EOMI, MMM, trach with minimal discharge CV: Paced rhythm S1 S2, no murmur Resp: Scattered rhonchi throughout.  No wheeze or rales Abd: +BS, belly soft and nontender Ext: chronic venous stasis changes on BL LE, 1+ pitting edema BL Neuro: Alert, Rass 0, no focal deficits  LABS:  CBC Recent Labs     03/12/13  0500  03/13/13  0610  03/13/13  1500  03/14/13  0545  WBC  11.8*  12.5*   --   14.1*  HGB  8.0*  7.2*  7.3*  7.1*  HCT  25.8*  22.9*  23.4*  22.5*  PLT  303  292   --   292   Coag's No results found for this basename: APTT, INR,  in the last 72 hours BMET Recent Labs     03/11/13  1920  03/13/13  0610  03/14/13  0545  NA  147*  142  138  K  4.4  4.0  4.1  CL  105  100  97  CO2  35*  35*  32  BUN  53*  49*  49*  CREATININE  1.43*  1.66*  1.76*  GLUCOSE  133*  145*  149*   Electrolytes Recent Labs     03/11/13  1920  03/13/13  0610  03/14/13  0545  CALCIUM  8.8  8.5  8.3*  MG   --   2.2   --   PHOS   --   3.9   --    Sepsis Markers No results found for this basename: LACTICACIDVEN, PROCALCITON, O2SATVEN,  in the last 72 hours ABG No results found for this basename: PHART, PCO2ART, PO2ART,  in the last 72 hours Liver Enzymes No results found for this basename: AST, ALT, ALKPHOS, BILITOT, ALBUMIN,  in the last 72 hours Cardiac Enzymes No results found for this basename: TROPONINI, PROBNP,  in the last 72 hours Glucose Recent Labs     03/13/13  1120  03/13/13  1722  03/13/13  2020  03/14/13  0021  03/14/13  0403  03/14/13  0835  GLUCAP  151*  128*  173*  146*  145*  157*   Imaging No results found.     ASSESSMENT / PLAN:  PULMONARY A: Acute on chronic hypercarbic respiratory failure, s/p tracheostomy Obesity hypoventilation  syndrome OSA (+ possible central SA) Difficult airway Trach 10/15 P:   - Trach collar as indicated, will begin in AM with TC trials and mandatory vent at night. - Scheduled BDs as ordered. - Mandatory nocturnal vent use. - Arrange for home vent - DO NOT change trach before 10/25. - mobilize as tol  CARDIOVASCULAR A:  Acute on chronic diastolic CHF  Paroxsymal SVT Symptomatic bradycardia s/p PPM placement, currently paced HTN    P:  - Hold diuresis currently - Continue clonidine, hydralazine, and coreg at 3.75 mg BID  RENAL Lab Results  Component Value Date   CREATININE 1.76* 03/14/2013   CREATININE 1.66* 03/13/2013   CREATININE 1.43* 03/11/2013    Recent Labs Lab 03/11/13 1920 03/13/13 0610 03/14/13 0545  NA 147* 142 138   A:   AKI, exacerbated by diuresis - creatinine trending down Compensatory elevation in HCO3 Hypokalemia - resolved 10/22 Hypernatremia, corrected 10/25 and free water d/c P:   - Continue to hold lasix  - Monitor BMET  - Correct electrolytes as indicated    GASTROINTESTINAL A:   Morbid obesity P:   - SUP  - Continue TF at goal.  HEMATOLOGIC  Recent Labs  03/13/13 1500 03/14/13 0545  HGB 7.3* 7.1*   A:   Normocytic anemia- likely of chronic disease + losses from trach site P:  - Trend CBC. -  trx  One unit of PRBCs if Hgb<7. - Continue iron supplement.  INFECTIOUS A:   No obvious infection      ENDOCRINE CBG (last 3)   Recent Labs  03/14/13 0021 03/14/13 0403 03/14/13 0835  GLUCAP 146* 145* 157*  A:   DM2 P:   - Lantus + SSI.  NEUROLOGIC A:   No acute concerns P:   - PRN fentanyl. - Avoid benzos and use of opiods prior to trach collar.  Reviewed care with fm/ nursing   Sandrea Hughs, MD Pulmonary and Critical Care Medicine [facility] Cell [phone] After 5:30 PM or weekends, call [phone]

## 2013-03-15 LAB — CULTURE, RESPIRATORY W GRAM STAIN: Gram Stain: NONE SEEN

## 2013-03-15 LAB — CBC
HCT: 21 % — ABNORMAL LOW (ref 36.0–46.0)
Hemoglobin: 6.9 g/dL — CL (ref 12.0–15.0)
MCHC: 32.9 g/dL (ref 30.0–36.0)
MCV: 89 fL (ref 78.0–100.0)
RBC: 2.36 MIL/uL — ABNORMAL LOW (ref 3.87–5.11)
RDW: 17 % — ABNORMAL HIGH (ref 11.5–15.5)
WBC: 10.9 10*3/uL — ABNORMAL HIGH (ref 4.0–10.5)

## 2013-03-15 LAB — BASIC METABOLIC PANEL
CO2: 34 mEq/L — ABNORMAL HIGH (ref 19–32)
Chloride: 100 mEq/L (ref 96–112)
Creatinine, Ser: 1.72 mg/dL — ABNORMAL HIGH (ref 0.50–1.10)
GFR calc non Af Amer: 32 mL/min — ABNORMAL LOW (ref >90)
Potassium: 4 mEq/L (ref 3.5–5.1)

## 2013-03-15 LAB — GLUCOSE, CAPILLARY
Glucose-Capillary: 125 mg/dL — ABNORMAL HIGH (ref 70–99)
Glucose-Capillary: 140 mg/dL — ABNORMAL HIGH (ref 70–99)
Glucose-Capillary: 145 mg/dL — ABNORMAL HIGH (ref 70–99)
Glucose-Capillary: 148 mg/dL — ABNORMAL HIGH (ref 70–99)
Glucose-Capillary: 149 mg/dL — ABNORMAL HIGH (ref 70–99)
Glucose-Capillary: 138 mg/dL — ABNORMAL HIGH (ref 70–99)

## 2013-03-15 LAB — PREPARE RBC (CROSSMATCH)

## 2013-03-15 MED ORDER — OXYCODONE-ACETAMINOPHEN 5-325 MG PO TABS
1.0000 | ORAL_TABLET | ORAL | Status: DC | PRN
  Administered 2013-03-15 – 2013-03-24 (×28): 1
  Filled 2013-03-15 (×30): qty 1

## 2013-03-15 MED ORDER — OXYCODONE-ACETAMINOPHEN 5-325 MG PO TABS
1.0000 | ORAL_TABLET | Freq: Once | ORAL | Status: AC
  Administered 2013-03-15: 1 via ORAL
  Filled 2013-03-15: qty 1

## 2013-03-15 MED ORDER — CIPROFLOXACIN IN D5W 400 MG/200ML IV SOLN
400.0000 mg | Freq: Two times a day (BID) | INTRAVENOUS | Status: DC
  Administered 2013-03-15 – 2013-03-23 (×16): 400 mg via INTRAVENOUS
  Filled 2013-03-15 (×17): qty 200

## 2013-03-15 NOTE — Progress Notes (Signed)
PULMONARY  / CRITICAL CARE MEDICINE  Name: [name] MRN: [id number] DOB: 02-Aug-1955    ADMISSION DATE:  02/24/2013 CONSULTATION DATE:  03/03/2013  REFERRING MD :  Kari Baars PRIMARY SERVICE: PCCM  CHIEF COMPLAINT:  VDRF due to OHS, pulm edema  BRIEF PATIENT DESCRIPTION:  28 yobf  admitted 10/07 to [facility] with hypercarbic failure due to OHS and CHF. Failed extubation 10/11 and difficult re-intubation (6.0 ETT). Transferred to [facility] ICU 10/14 for further eval and mgmt  SIGNIFICANT EVENTS / STUDIES:  10/7 Admitted for hypercarbic resp failure, OHS, CHF.  Intubated 10/9 transient SVT controlled with IV cardizem, transitioned to PO cardizem and coreg 10/11 pt extubated and re-intubated, steroids, difficult re-intubation used # 6 tube 10/14 transferred to [facility] ICU, likely needs trach 10/15 tracheostomy tube placed By ENT  LINES / TUBES: ETT 10/07 >> 10/11, 10/11>>>10/15 Tracheostomy(Teoh) 10/15>>> Foley 10/8>>>  CULTURES: MRSA PCR 10/7 >> neg 10-17 bc>> neg 10-17 sputum>>gpc,gnr,gpr> mod strep not Group A 10-17 uc>>mul species 10/24 sputum> few gnr > e coli resistant to pcn, sensitive to cipro    ANTIBIOTICS: Cipro (e coli tracheitis) 10/26 >>>    SUBJECTIVE:  Patient resting comfortably   - no events overnight     VITAL SIGNS: Temp:  [98.2 F (36.8 C)-98.9 F (37.2 C)] 98.7 F (37.1 C) (10/26 1100) Pulse Rate:  [58-79] 69 (10/26 1215) Resp:  [16-38] 20 (10/26 1215) BP: (116-160)/(59-99) 149/84 mmHg (10/26 1100) SpO2:  [94 %-100 %] 99 % (10/26 1215) FiO2 (%):  [40 %] 40 % (10/26 1215) HEMODYNAMICS:   VENTILATOR SETTINGS: Vent Mode:  [-] CPAP;PSV FiO2 (%):  [40 %] 40 % Set Rate:  [12 bmp] 12 bmp Vt Set:  [500 mL] 500 mL PEEP:  [5 cmH20] 5 cmH20 Pressure Support:  [12 cmH20] 12 cmH20 Plateau Pressure:  [24 cmH20-28 cmH20] 24 cmH20 INTAKE / OUTPUT: Intake/Output     10/25 0701 - 10/26 0700 10/26 0701 - 10/27 0700   NG/GT 2270    Total Intake(mL/kg)  2270 (12.6)    Urine (mL/kg/hr) 2400 (0.6) 300 (0.3)   Total Output 2400 300   Net -130 -300        Stool Occurrence 1 x 1 x     PHYSICAL EXAMINATION: Gen: morbidly obese, lying in bed, comfortable on  vent. HEENT: NCAT, EOMI, MMM, trach with minimal discharge CV: Paced rhythm S1 S2, no murmur Resp: Scattered rhonchi throughout.  No wheeze or rales Abd: +BS, belly soft and nontender Ext: chronic venous stasis changes on BL LE, 1+ pitting edema BL Neuro: Alert,   no focal deficits  LABS:  CBC Recent Labs     03/13/13  0610  03/13/13  1500  03/14/13  0545  03/15/13  0413  WBC  12.5*   --   14.1*  10.9*  HGB  7.2*  7.3*  7.1*  6.9*  HCT  22.9*  23.4*  22.5*  21.0*  PLT  292   --   292  258   Coag's No results found for this basename: APTT, INR,  in the last 72 hours BMET Recent Labs     03/13/13  0610  03/14/13  0545  03/15/13  0413  NA  142  138  141  K  4.0  4.1  4.0  CL  100  97  100  CO2  35*  32  34*  BUN  49*  49*  49*  CREATININE  1.66*  1.76*  1.72*  GLUCOSE  145*  149*  139*   Electrolytes Recent Labs     03/13/13  0610  03/14/13  0545  03/15/13  0413  CALCIUM  8.5  8.3*  8.7  MG  2.2   --    --   PHOS  3.9   --    --    Sepsis Markers No results found for this basename: LACTICACIDVEN, PROCALCITON, O2SATVEN,  in the last 72 hours ABG No results found for this basename: PHART, PCO2ART, PO2ART,  in the last 72 hours Liver Enzymes No results found for this basename: AST, ALT, ALKPHOS, BILITOT, ALBUMIN,  in the last 72 hours Cardiac Enzymes No results found for this basename: TROPONINI, PROBNP,  in the last 72 hours Glucose Recent Labs     03/14/13  1214  03/14/13  1658  03/14/13  2011  03/15/13  0020  03/15/13  0431  03/15/13  0743  GLUCAP  170*  159*  129*  125*  140*  145*   Imaging No results found.     ASSESSMENT / PLAN:  PULMONARY A: Acute on chronic hypercarbic respiratory failure, s/p tracheostomy Obesity hypoventilation  syndrome OSA (+ possible central SA) Difficult airway Trach 10/15 P:   - Trach collar as indicated, will begin in AM with TC trials and mandatory vent at night. - Scheduled BDs as ordered. - Mandatory nocturnal vent use. - Arrange for home vent - DO NOT change trach before 10/25. - mobilize as tol  CARDIOVASCULAR A:  Acute on chronic diastolic CHF  Paroxsymal SVT Symptomatic bradycardia s/p PPM placement, currently paced HTN    P:  - Hold diuresis currently - Continue clonidine, hydralazine, and coreg at 3.75 mg BID  RENAL Lab Results  Component Value Date   CREATININE 1.72* 03/15/2013   CREATININE 1.76* 03/14/2013   CREATININE 1.66* 03/13/2013    Recent Labs Lab 03/13/13 0610 03/14/13 0545 03/15/13 0413  NA 142 138 141   A:   AKI, exacerbated by diuresis - creatinine trending down Compensatory elevation in HCO3 Hypokalemia - resolved 10/22 Hypernatremia, corrected 10/25 and free water d/c P:   - Continue to hold lasix  - Monitor BMET  - Correct electrolytes as indicated    GASTROINTESTINAL A:   Morbid obesity P:   - SUP  - Continue TF at goal.  HEMATOLOGIC  Recent Labs  03/14/13 0545 03/15/13 0413  HGB 7.1* 6.9*   A:   Normocytic anemia- likely of chronic disease + losses from trach site P:  - Trend CBC. -  trx  One unit of PRBCs for  Hgb<7  10/26 - Continue iron supplement.  INFECTIOUS A:   E. Coli tracheobronchitis 10/24 >  rx cipro per dashboard    ENDOCRINE CBG (last 3)   Recent Labs  03/15/13 0020 03/15/13 0431 03/15/13 0743  GLUCAP 125* 140* 145*  A:   DM2 P:   - Lantus + SSI.  NEUROLOGIC A:   No acute concerns P:   - PRN fentanyl. - Avoid benzos and use of opiods prior to trach collar.      Sandrea Hughs, MD Pulmonary and Critical Care Medicine [facility] Cell [phone] After 5:30 PM or weekends, call [phone]

## 2013-03-15 NOTE — Progress Notes (Signed)
eLink Physician-Brief Progress Note Patient Name: [name] DOB: 28-May-1955 MRN: [id number]  Date of Service  03/15/2013   HPI/Events of Note  Call from nurse reporting patient with arthritic pain in knees not relieved with tylenol.     eICU Interventions  Plan: One time dose of percocet 5/325.   Intervention Category Minor Interventions: Routine modifications to care plan (e.g. PRN medications for pain, fever)  DETERDING,ELIZABETH 03/15/2013, 2:26 AM

## 2013-03-16 LAB — TYPE AND SCREEN
ABO/RH(D): O POS
Unit division: 0

## 2013-03-16 LAB — CBC
Hemoglobin: 7.4 g/dL — ABNORMAL LOW (ref 12.0–15.0)
MCH: 28.9 pg (ref 26.0–34.0)
MCHC: 31.5 g/dL (ref 30.0–36.0)
WBC: 10 10*3/uL (ref 4.0–10.5)

## 2013-03-16 LAB — BASIC METABOLIC PANEL
BUN: 42 mg/dL — ABNORMAL HIGH (ref 6–23)
CO2: 32 mEq/L (ref 19–32)
Calcium: 8.5 mg/dL (ref 8.4–10.5)
Chloride: 98 mEq/L (ref 96–112)
GFR calc non Af Amer: 36 mL/min — ABNORMAL LOW (ref >90)
Glucose, Bld: 156 mg/dL — ABNORMAL HIGH (ref 70–99)
Potassium: 4.3 mEq/L (ref 3.5–5.1)

## 2013-03-16 LAB — GLUCOSE, CAPILLARY
Glucose-Capillary: 141 mg/dL — ABNORMAL HIGH (ref 70–99)
Glucose-Capillary: 159 mg/dL — ABNORMAL HIGH (ref 70–99)
Glucose-Capillary: 143 mg/dL — ABNORMAL HIGH (ref 70–99)

## 2013-03-16 NOTE — Progress Notes (Signed)
SLP Cancellation Note  Patient Details Name: [name] MRN: [id number] DOB: 1955-10-04   Cancelled treatment:       Reason Eval/Treat Not Completed: Medical issues which prohibited therapy . On vent. Will f/u.   Ferdinand Lango MA, CCC-SLP [phone]    [name] 03/16/2013, 10:01 AM

## 2013-03-16 NOTE — Progress Notes (Signed)
PULMONARY  / CRITICAL CARE MEDICINE  Name: [name] MRN: [id number] DOB: 04/12/1956    ADMISSION DATE:  02/24/2013 CONSULTATION DATE:  03/03/2013  REFERRING MD :  Kari Baars PRIMARY SERVICE: PCCM  CHIEF COMPLAINT:  VDRF due to OHS, pulm edema  BRIEF PATIENT DESCRIPTION:  32 yobf  admitted 10/07 to [facility] with hypercarbic failure due to OHS and CHF. Failed extubation 10/11 and difficult re-intubation (6.0 ETT). Transferred to [facility] ICU 10/14 for further eval and mgmt  SIGNIFICANT EVENTS / STUDIES:  10/7 Admitted for hypercarbic resp failure, OHS, CHF.  Intubated 10/9 transient SVT controlled with IV cardizem, transitioned to PO cardizem and coreg 10/11 pt extubated and re-intubated, steroids, difficult re-intubation used # 6 tube 10/14 transferred to [facility] ICU, likely needs trach 10/15 tracheostomy tube placed By ENT  LINES / TUBES: ETT 10/07 >> 10/11, 10/11>>>10/15 Tracheostomy(Teoh) 10/15>>>changed to 6 cuffed xlt 10/27 Foley 10/8>>>  CULTURES: MRSA PCR 10/7 >> neg 10-17 bc>> neg 10-17 sputum>>gpc,gnr,gpr> mod strep not Group A 10-17 uc>>mul species 10/24 sputum> few gnr > e coli resistant to pcn, sensitive to cipro    ANTIBIOTICS: Cipro (e coli tracheitis) 10/26 >>>    SUBJECTIVE:  Patient resting comfortably   - no events overnight     VITAL SIGNS: Temp:  [97.5 F (36.4 C)-99.1 F (37.3 C)] 98 F (36.7 C) (10/27 1100) Pulse Rate:  [59-89] 76 (10/27 1300) Resp:  [12-27] 23 (10/27 1300) BP: (123-161)/(47-94) 136/78 mmHg (10/27 1300) SpO2:  [97 %-100 %] 99 % (10/27 1300) FiO2 (%):  [40 %] 40 % (10/27 1210) Weight:  [182 kg (401 lb 3.8 oz)] 182 kg (401 lb 3.8 oz) (10/27 0600) HEMODYNAMICS:   VENTILATOR SETTINGS: Vent Mode:  [-] PSV;CPAP FiO2 (%):  [40 %] 40 % Set Rate:  [12 bmp] 12 bmp Vt Set:  [500 mL] 500 mL PEEP:  [5 cmH20] 5 cmH20 Pressure Support:  [8 cmH20-12 cmH20] 8 cmH20 Plateau Pressure:  [24 cmH20-28 cmH20] 28 cmH20 INTAKE /  OUTPUT: Intake/Output     10/26 0701 - 10/27 0700 10/27 0701 - 10/28 0700   I.V. (mL/kg) 100 (0.5)    Blood 315    NG/GT 900 100   IV Piggyback 400    Total Intake(mL/kg) 1715 (9.4) 100 (0.5)   Urine (mL/kg/hr) 2025 (0.5) 300 (0.3)   Total Output 2025 300   Net -310 -200        Stool Occurrence 3 x      PHYSICAL EXAMINATION: Gen: morbidly obese, lying in bed, comfortable on  vent. HEENT: NCAT, EOMI, MMM, trach with minimal discharge, Trach unremarkable  CV: Paced rhythm S1 S2, no murmur Resp: Scattered rhonchi throughout. Decreased  Abd: +BS, belly soft and nontender Ext: chronic venous stasis changes on BL LE, 1+ pitting edema BL Neuro: Alert,   no focal deficits  LABS:  CBC Recent Labs     03/14/13  0545  03/15/13  0413  03/16/13  0450  WBC  14.1*  10.9*  10.0  HGB  7.1*  6.9*  7.4*  HCT  22.5*  21.0*  23.5*  PLT  292  258  270   Coag's No results found for this basename: APTT, INR,  in the last 72 hours BMET Recent Labs     03/14/13  0545  03/15/13  0413  03/16/13  0450  NA  138  141  140  K  4.1  4.0  4.3  CL  97  100  98  CO2  32  34*  32  BUN  49*  49*  42*  CREATININE  1.76*  1.72*  1.54*  GLUCOSE  149*  139*  156*   Electrolytes Recent Labs     03/14/13  0545  03/15/13  0413  03/16/13  0450  CALCIUM  8.3*  8.7  8.5   Sepsis Markers No results found for this basename: LACTICACIDVEN, PROCALCITON, O2SATVEN,  in the last 72 hours ABG No results found for this basename: PHART, PCO2ART, PO2ART,  in the last 72 hours Liver Enzymes No results found for this basename: AST, ALT, ALKPHOS, BILITOT, ALBUMIN,  in the last 72 hours Cardiac Enzymes No results found for this basename: TROPONINI, PROBNP,  in the last 72 hours Glucose Recent Labs     03/15/13  1604  03/15/13  2029  03/15/13  2350  03/16/13  0408  03/16/13  0801  03/16/13  1240  GLUCAP  149*  138*  126*  147*  142*  141*   Imaging No results found.     ASSESSMENT /  PLAN:   Acute on chronic hypercarbic respiratory failure, s/p tracheostomy Obesity hypoventilation syndrome OSA (+ possible central SA) Difficult airway Trach 10/15 E coli tracheobronchitis  P:   -cont PSV w/ hopeful goal to be day-time trach collar, night time vent  - Scheduled BDs as ordered. - Arrange for home vent - mobilize as tol - see dashboard   Acute on chronic diastolic CHF  Paroxsymal SVT Symptomatic bradycardia s/p PPM placement, currently paced HTN P:  - Hold diuresis currently - Continue clonidine, hydralazine, and coreg at 3.75 mg BID    AKI, exacerbated by diuresis - creatinine trending down Compensatory elevation in HCO3 Hypokalemia - resolved 10/22 Hypernatremia, corrected 10/25 and free water d/c P:   - Continue to hold lasix  - Monitor BMET  - Correct electrolytes as indicated   Morbid obesity P:   - SUP  - Continue TF at goal.  Normocytic anemia- likely of chronic disease + losses from trach site P:  - Trend CBC. -  trx  One unit of PRBCs for  Hgb<7  10/26 - Continue iron supplement.  DM2 P:   - Lantus + SSI.  Awaiting training for home vent.  CSW working with family at this point.  Patient seen and examined, agree with above note.  I dictated the care and orders written for this patient under my direction.  Alyson Reedy, MD [phone]

## 2013-03-16 NOTE — Progress Notes (Signed)
Subjective: Pt remains on vent. Venting well via trach. No distress.  No bleeding.  Objective: Vital signs in last 24 hours: Temp:  [97.5 F (36.4 C)-99.1 F (37.3 C)] 97.6 F (36.4 C) (10/27 0819) Pulse Rate:  [59-82] 60 (10/27 0819) Resp:  [12-27] 16 (10/27 0819) BP: (116-161)/(47-98) 151/78 mmHg (10/27 0819) SpO2:  [97 %-100 %] 100 % (10/27 0819) FiO2 (%):  [40 %] 40 % (10/27 0819) Weight:  [182 kg (401 lb 3.8 oz)] 182 kg (401 lb 3.8 oz) (10/27 0600)  Pt awake and responsive. Morbidly obese.  On vent. Venting well via trach. Good O2 sat.  Trach midline, in place. No bleeding.   Recent Labs  03/15/13 0413 03/16/13 0450  WBC 10.9* 10.0  HGB 6.9* 7.4*  HCT 21.0* 23.5*  PLT 258 270    Recent Labs  03/15/13 0413 03/16/13 0450  NA 141 140  K 4.0 4.3  CL 100 98  CO2 34* 32  GLUCOSE 139* 156*  BUN 49* 42*  CREATININE 1.72* 1.54*  CALCIUM 8.7 8.5    Medications:  I have reviewed the patient's current medications. Scheduled: . albuterol  2.5 mg Nebulization Q4H  . antiseptic oral rinse  15 mL Mouth Rinse q12n4p  . carvedilol  3.125 mg Oral BID WC  . chlorhexidine  15 mL Mouth Rinse BID  . ciprofloxacin  400 mg Intravenous Q12H  . cloNIDine  0.3 mg Oral TID  . famotidine  20 mg Per Tube BID  . feeding supplement (PRO-STAT SUGAR FREE 64)  30 mL Per Tube TID  . ferrous sulfate  300 mg Per Tube BID WC  . heparin subcutaneous  5,000 Units Subcutaneous Q8H  . hydrALAZINE  100 mg Oral Q6H  . insulin aspart  0-20 Units Subcutaneous Q4H  . insulin aspart  3 Units Subcutaneous Q4H  . insulin glargine  40 Units Subcutaneous Daily  . nystatin   Topical BID   ZOX:[id number], acetaminophen, albuterol, LORazepam, ondansetron (ZOFRAN) IV, oxyCODONE-acetaminophen, promethazine  Assessment/Plan: POD#12 s/p trach. Back on vent. Trach changed to a new #6 cuffed Shiley XLT.  Venting well after trach change.  Routine trach care.    LOS: 20 days   Rilynne Lonsway,SUI W 03/16/2013,  8:39 AM

## 2013-03-16 NOTE — Clinical Social Work Note (Signed)
CSW continues to follow case and will submit clinicals to facilities if pt and pt's family decide SNF is a desirable option.    Maryclare Labrador, MSW, [facility] Clinical Social Worker [phone]

## 2013-03-17 LAB — GLUCOSE, CAPILLARY
Glucose-Capillary: 134 mg/dL — ABNORMAL HIGH (ref 70–99)
Glucose-Capillary: 139 mg/dL — ABNORMAL HIGH (ref 70–99)
Glucose-Capillary: 144 mg/dL — ABNORMAL HIGH (ref 70–99)
Glucose-Capillary: 152 mg/dL — ABNORMAL HIGH (ref 70–99)
Glucose-Capillary: 159 mg/dL — ABNORMAL HIGH (ref 70–99)

## 2013-03-17 NOTE — Clinical Social Work Note (Signed)
CSW followed up again with pt, asking how she is feeling today. Pt gave a thumbs-up and mouthed that she is feeling better. CSW asked if the plan is still for pt to go home, and pt confirmed this. CSW asked again to be sure pt would not like her information sent to vent SNF facilities as a back-up. Pt declined offer. CSW officially signing off and will provide services if re-consulted.    Maryclare Labrador, MSW, [facility] Clinical Social Worker [phone]

## 2013-03-17 NOTE — Progress Notes (Signed)
Pt seen to evaluate trach and prepare for trach education. Granddaughter at bedside. Pt states she will be discharging home and states that daughter, husband and granddaughter will be primary care givers.  There is not a discharge date as of now and still not certain that pt will be discharged home vs SNF per CSW notes. Informed pt and granddaughter that I would need the three care givers to come in for trach education. Will continue to follow and see when discharge date will be and set up time with family to start education if the plan is for pt to discharge home.

## 2013-03-17 NOTE — Progress Notes (Signed)
PULMONARY  / CRITICAL CARE MEDICINE  Name: [name] MRN: [id number] DOB: [birth year]    ADMISSION DATE:  02/24/2013 CONSULTATION DATE:  03/03/2013  REFERRING MD :  Kari Baars PRIMARY SERVICE: PCCM  CHIEF COMPLAINT:  VDRF due to OHS, pulm edema  BRIEF PATIENT DESCRIPTION:  28 yobf  admitted 10/07 to [facility] with hypercarbic failure due to OHS and CHF. Failed extubation 10/11 and difficult re-intubation (6.0 ETT). Transferred to [facility] ICU 10/14 for further eval and mgmt  SIGNIFICANT EVENTS / STUDIES:  10/7 Admitted for hypercarbic resp failure, OHS, CHF.  Intubated 10/9 transient SVT controlled with IV cardizem, transitioned to PO cardizem and coreg 10/11 pt extubated and re-intubated, steroids, difficult re-intubation used # 6 tube 10/14 transferred to [facility] ICU, likely needs trach 10/15 tracheostomy tube placed By ENT  LINES / TUBES: ETT 10/07 >> 10/11, 10/11>>>10/15 Tracheostomy(Teoh) 10/15>>>changed to 6 cuffed xlt 10/27 Foley 10/8>>>  CULTURES: MRSA PCR 10/7 >> neg 10-17 bc>> neg 10-17 sputum>>gpc,gnr,gpr> mod strep not Group A 10-17 uc>>mul species 10/24 sputum> few gnr > e coli resistant to pcn, sensitive to cipro   ANTIBIOTICS: Cipro (e coli tracheitis) 10/26 >>>  SUBJECTIVE:  Patient resting comfortably - tolerating TC well  VITAL SIGNS: Temp:  [97.4 F (36.3 C)-98.4 F (36.9 C)] 97.4 F (36.3 C) (10/28 0832) Pulse Rate:  [63-79] 72 (10/28 0916) Resp:  [12-26] 23 (10/28 0916) BP: (118-180)/(62-97) 166/97 mmHg (10/28 0916) SpO2:  [96 %-100 %] 100 % (10/28 0916) FiO2 (%):  [40 %] 40 % (10/28 0916) Weight:  [186.8 kg (411 lb 13.1 oz)] 186.8 kg (411 lb 13.1 oz) (10/28 0500) HEMODYNAMICS:   VENTILATOR SETTINGS: Vent Mode:  [-] PRVC FiO2 (%):  [40 %] 40 % Set Rate:  [12 bmp] 12 bmp Vt Set:  [500 mL] 500 mL PEEP:  [5 cmH20] 5 cmH20 Pressure Support:  [8 cmH20] 8 cmH20 INTAKE / OUTPUT: Intake/Output     10/27 0701 - 10/28 0700 10/28 0701 - 10/29 0700   I.V. (mL/kg)     Blood     NG/GT 540    IV Piggyback     Total Intake(mL/kg) 540 (2.9)    Urine (mL/kg/hr) 2175 (0.5) 250 (0.3)   Total Output 2175 250   Net -1635 -250         PHYSICAL EXAMINATION: Gen: morbidly obese, lying in bed, comfortable on  vent. HEENT: NCAT, EOMI, MMM, trach with minimal discharge, Trach unremarkable  CV: Paced rhythm S1 S2, no murmur Resp: Scattered rhonchi throughout. Decreased  Abd: +BS, belly soft and nontender Ext: chronic venous stasis changes on BL LE, 1+ pitting edema BL Neuro: Alert,   no focal deficits  LABS:  CBC Recent Labs     03/15/13  0413  03/16/13  0450  WBC  10.9*  10.0  HGB  6.9*  7.4*  HCT  21.0*  23.5*  PLT  258  270   Coag's No results found for this basename: APTT, INR,  in the last 72 hours BMET Recent Labs     03/15/13  0413  03/16/13  0450  NA  141  140  K  4.0  4.3  CL  100  98  CO2  34*  32  BUN  49*  42*  CREATININE  1.72*  1.54*  GLUCOSE  139*  156*   Electrolytes Recent Labs     03/15/13  0413  03/16/13  0450  CALCIUM  8.7  8.5   Sepsis Markers No  results found for this basename: LACTICACIDVEN, PROCALCITON, O2SATVEN,  in the last 72 hours ABG No results found for this basename: PHART, PCO2ART, PO2ART,  in the last 72 hours Liver Enzymes No results found for this basename: AST, ALT, ALKPHOS, BILITOT, ALBUMIN,  in the last 72 hours Cardiac Enzymes No results found for this basename: TROPONINI, PROBNP,  in the last 72 hours Glucose Recent Labs     03/16/13  1240  03/16/13  1653  03/16/13  2044  03/17/13  0010  03/17/13  0424  03/17/13  0815  GLUCAP  141*  159*  143*  134*  153*  139*   Imaging No results found.  ASSESSMENT / PLAN:  Acute on chronic hypercarbic respiratory failure, s/p tracheostomy Obesity hypoventilation syndrome OSA (+ possible central SA) Difficult airway Trach 10/15 E coli tracheobronchitis  P:   - TC as tolerated, full vent support at night then TC again in  AM. - Scheduled BDs as ordered. - Arrange for home vent. - Mobilize as tol. - See dashboard.  Acute on chronic diastolic CHF  Paroxsymal SVT Symptomatic bradycardia s/p PPM placement, currently paced HTN P:  - Hold diuresis currently. - Continue clonidine, hydralazine, and coreg at 3.75 mg BID.    AKI, exacerbated by diuresis - creatinine trending down Compensatory elevation in HCO3 Hypokalemia - resolved 10/22 Hypernatremia, corrected 10/25 and free water d/c P:   - Continue to hold lasix, auto diuresing. - Monitor BMET. - Correct electrolytes as indicated.   Morbid obesity P:   - SUP. - Continue TF at goal.  Normocytic anemia- likely of chronic disease + losses from trach site P:  - Trend CBC. - Trx  One unit of PRBCs for  Hgb<7  10/26. - Continue iron supplement.  DM2 P:   - Lantus + SSI.  Awaiting training for home vent.  CSW working with family at this point.  Alyson Reedy, M.D. [facility] Pulmonary/Critical Care Medicine. Pager: [phone]. After hours pager: [phone].

## 2013-03-18 ENCOUNTER — Inpatient Hospital Stay (HOSPITAL_COMMUNITY): Payer: Medicaid Other

## 2013-03-18 LAB — CBC
HCT: 24.7 % — ABNORMAL LOW (ref 36.0–46.0)
Hemoglobin: 7.7 g/dL — ABNORMAL LOW (ref 12.0–15.0)
MCV: 92.2 fL (ref 78.0–100.0)
RBC: 2.68 MIL/uL — ABNORMAL LOW (ref 3.87–5.11)
RDW: 16.4 % — ABNORMAL HIGH (ref 11.5–15.5)
WBC: 10.5 10*3/uL (ref 4.0–10.5)

## 2013-03-18 LAB — BASIC METABOLIC PANEL
BUN: 36 mg/dL — ABNORMAL HIGH (ref 6–23)
CO2: 33 mEq/L — ABNORMAL HIGH (ref 19–32)
Chloride: 98 mEq/L (ref 96–112)
Creatinine, Ser: 1.41 mg/dL — ABNORMAL HIGH (ref 0.50–1.10)
GFR calc Af Amer: 47 mL/min — ABNORMAL LOW (ref >90)
Glucose, Bld: 179 mg/dL — ABNORMAL HIGH (ref 70–99)
Potassium: 4.4 mEq/L (ref 3.5–5.1)

## 2013-03-18 LAB — GLUCOSE, CAPILLARY
Glucose-Capillary: 130 mg/dL — ABNORMAL HIGH (ref 70–99)
Glucose-Capillary: 188 mg/dL — ABNORMAL HIGH (ref 70–99)
Glucose-Capillary: 145 mg/dL — ABNORMAL HIGH (ref 70–99)
Glucose-Capillary: 139 mg/dL — ABNORMAL HIGH (ref 70–99)

## 2013-03-18 LAB — PHOSPHORUS: Phosphorus: 3.9 mg/dL (ref 2.3–4.6)

## 2013-03-18 NOTE — Progress Notes (Signed)
PULMONARY  / CRITICAL CARE MEDICINE  Name: [name] MRN: [id number] DOB: [birth year]    ADMISSION DATE:  02/24/2013 CONSULTATION DATE:  03/03/2013  REFERRING MD :  Kari Baars PRIMARY SERVICE: PCCM  CHIEF COMPLAINT:  VDRF due to OHS, pulm edema  BRIEF PATIENT DESCRIPTION:  23 yobf  admitted 10/07 to [facility] with hypercarbic failure due to OHS and CHF. Failed extubation 10/11 and difficult re-intubation (6.0 ETT). Transferred to [facility] ICU 10/14 for further eval and mgmt  SIGNIFICANT EVENTS / STUDIES:  10/7 Admitted for hypercarbic resp failure, OHS, CHF.  Intubated 10/9 transient SVT controlled with IV cardizem, transitioned to PO cardizem and coreg 10/11 pt extubated and re-intubated, steroids, difficult re-intubation used # 6 tube 10/14 transferred to [facility] ICU, likely needs trach 10/15 tracheostomy tube placed By ENT  LINES / TUBES: ETT 10/07 >> 10/11, 10/11>>>10/15 Tracheostomy(Teoh) 10/15>>>changed to 6 cuffed xlt 10/27 Foley 10/8>>>  CULTURES: MRSA PCR 10/7 >> neg 10-17 bc>> neg 10-17 sputum>>gpc,gnr,gpr> mod strep not Group A 10-17 uc>>mul species 10/24 sputum> few gnr > e coli resistant to pcn, sensitive to cipro   ANTIBIOTICS: Cipro (e coli tracheitis) 10/26 >>>  SUBJECTIVE:  Patient resting comfortably - tolerating TC well  VITAL SIGNS: Temp:  [97.5 F (36.4 C)-98.2 F (36.8 C)] 98.2 F (36.8 C) (10/29 0751) Pulse Rate:  [58-80] 65 (10/29 0822) Resp:  [15-26] 19 (10/29 0822) BP: (118-174)/(57-98) 142/83 mmHg (10/29 1051) SpO2:  [96 %-100 %] 100 % (10/29 0822) FiO2 (%):  [40 %] 40 % (10/29 0822) HEMODYNAMICS:   VENTILATOR SETTINGS: Vent Mode:  [-] PRVC FiO2 (%):  [40 %] 40 % Set Rate:  [12 bmp] 12 bmp Vt Set:  [500 mL] 500 mL PEEP:  [5 cmH20] 5 cmH20 INTAKE / OUTPUT: Intake/Output     10/28 0701 - 10/29 0700 10/29 0701 - 10/30 0700   NG/GT 180    IV Piggyback 200    Total Intake(mL/kg) 380 (2)    Urine (mL/kg/hr) 1500 (0.3) 300 (0.4)   Total  Output 1500 300   Net -1120 -300        Stool Occurrence 1 x     PHYSICAL EXAMINATION: Gen: morbidly obese, lying in bed, comfortable on  vent. HEENT: NCAT, EOMI, MMM, trach with minimal discharge, Trach unremarkable  CV: Paced rhythm S1 S2, no murmur Resp: CTA bilaterally Abd: +BS, belly soft and nontender Ext: chronic venous stasis changes on BL LE, 1+ pitting edema BL Neuro: Alert,   no focal deficits  LABS:  CBC Recent Labs     03/16/13  0450  WBC  10.0  HGB  7.4*  HCT  23.5*  PLT  270   Coag's No results found for this basename: APTT, INR,  in the last 72 hours BMET Recent Labs     03/16/13  0450  NA  140  K  4.3  CL  98  CO2  32  BUN  42*  CREATININE  1.54*  GLUCOSE  156*   Electrolytes Recent Labs     03/16/13  0450  CALCIUM  8.5   Sepsis Markers No results found for this basename: LACTICACIDVEN, PROCALCITON, O2SATVEN,  in the last 72 hours ABG No results found for this basename: PHART, PCO2ART, PO2ART,  in the last 72 hours Liver Enzymes No results found for this basename: AST, ALT, ALKPHOS, BILITOT, ALBUMIN,  in the last 72 hours Cardiac Enzymes No results found for this basename: TROPONINI, PROBNP,  in the last 72 hours  Glucose Recent Labs     03/17/13  1159  03/17/13  1739  03/17/13  1957  03/18/13  0104  03/18/13  0430  03/18/13  0803  GLUCAP  159*  144*  152*  140*  130*  168*   Imaging Dg Chest Port 1 View  03/18/2013   CLINICAL DATA:  Respiratory failure and shortness of breath  EXAM: PORTABLE CHEST - 1 VIEW  COMPARISON:  03/10/2013  FINDINGS: Similar orientation of a tracheostomy tube, which is tilted. There is a dual-chamber left subclavian approach pacer which is partly imaged.  Significantly worsening aeration on the left, likely with volume loss - although rotation is limiting. Diffuse interstitial prominence is increased. There may be layering effusions.  IMPRESSION: 1. Significant worsening of left lung aeration, which may be  related to extensive atelectasis, large effusion, or new pneumonia. 2. Increasing pulmonary edema, best seen on the right.   Electronically Signed   By: Tiburcio Pea M.D.   On: 03/18/2013 06:47    ASSESSMENT / PLAN:  Acute on chronic hypercarbic respiratory failure, s/p tracheostomy Obesity hypoventilation syndrome OSA (+ possible central SA) Difficult airway Trach 10/15 E coli tracheobronchitis  P:   - TC as tolerated, full vent support at night - Scheduled BDs as ordered. - Arrange for home vent. - Mobilize as tol. - family to complete vent training today  Acute on chronic diastolic CHF  Paroxsymal SVT Symptomatic bradycardia s/p PPM placement, currently paced HTN P:  - Hold diuresis currently. - Continue clonidine, hydralazine, and coreg at 3.75 mg BID.    AKI, exacerbated by diuresis - creatinine trending down Compensatory elevation in HCO3 Hypokalemia - resolved 10/22 Hypernatremia, corrected 10/25 and free water d/c P:   - Continue to hold lasix, auto diuresing. - Monitor BMET. - Correct electrolytes as indicated.   Morbid obesity P:   - SUP. - Continue TF at goal. - Swallow evaluation today and PMV trials.  Normocytic anemia- likely of chronic disease + losses from trach site P:  - Trend CBC. - Continue iron supplement.  DM2 P:   - Lantus + SSI.  Carley Hammed Physician Assistant Student 03/18/13, 11:41 am    Patient doing very well on trach collar during the day.  Continues to work with speech valve.  Family to continue home vent training.  Swallow evaluation today and if passes then will hopefully avoid PEG if fails then will wait and repeat of Friday prior to commitment to a PEG since patient is not ready to be discharged regardless.  SCr continues to improve and patient remains net negative without diureses.  Will check BMET and cbc today to ensure stable Hgb.   Patient seen and examined, agree with above note.  I dictated the care and  orders written for this patient under my direction.  Alyson Reedy, MD [phone]

## 2013-03-18 NOTE — Progress Notes (Signed)
Trach education started with family. Pt's husband, two daughters and granddaughter at bedside and they all seem very eager to learn. Family displayed high level of understanding and demonstrated back very well.

## 2013-03-18 NOTE — Progress Notes (Signed)
Speech Language Pathology Treatment: [name] Speaking valve  Patient Details Name: [name] MRN: [id number] DOB: 1956/03/21 Today's Date: 03/18/2013 Time: 0930-1004 SLP Time Calculation (min): 34 min  Assessment / Plan / Recommendation Clinical Impression  PMSV trials complete. Cuff deflated by RT prior to  SLP treatment. Patient able to tolerate PMSV in place for 5-6 minute intervals without decline in vital signs, patent upper airway as evidence by improving quality of phonation which remains now mildly hoarse and breathy. SLP provided moderate -cueing for coordination of deep breath and phonatory output given moderately decreased breath support (1-2 words per breath). Subtle evidence of CO2 retention noted over time as evidenced by audible wheezy exhalations and slight burst of air from trach hub with valve removal. Education complete with patient regarding performance and progress. Valve removed and cuff left deflated. Mildly increased secretions noted in which patient requested to be deep suctioned. This SLP left room to speak with RN and when returned, patient with increased HR, mouthing "I cant breathe, I cant breath!" and drop in O2 to the mid 70s. RN and RT called to room to suction. RT placed back on vent briefly with return in O2 and HR to baseline, then back to trach collar. Recommend PMSV use with SLP only at this time. Will f/u.    HPI HPI: 57 y/o female admitted 10/07 to [facility] with hypercarbic failure due to OHS and CHF. Failed extubation 10/11 and difficult re-intubation (6.0 ETT). Transferred to [facility] ICU 10/14 for further eval and mgmt.  S/p tracheostomy 10/15 due to inability to wean from vent.  Now tolerating trach collar.        SLP Plan  Continue with current plan of care    Recommendations        Patient may use Passy-Muir Speech Valve: with SLP only       Oral Care Recommendations: Oral care BID Plan: Continue with current plan of care    GO   [facility] MA,  CCC-SLP [phone]   [name] 03/18/2013, 10:05 AM

## 2013-03-19 LAB — CBC
HCT: 24 % — ABNORMAL LOW (ref 36.0–46.0)
Hemoglobin: 7.5 g/dL — ABNORMAL LOW (ref 12.0–15.0)
MCH: 28.8 pg (ref 26.0–34.0)
MCHC: 31.3 g/dL (ref 30.0–36.0)
Platelets: 317 10*3/uL (ref 150–400)
RBC: 2.6 MIL/uL — ABNORMAL LOW (ref 3.87–5.11)
RDW: 16.4 % — ABNORMAL HIGH (ref 11.5–15.5)

## 2013-03-19 LAB — GLUCOSE, CAPILLARY
Glucose-Capillary: 141 mg/dL — ABNORMAL HIGH (ref 70–99)
Glucose-Capillary: 143 mg/dL — ABNORMAL HIGH (ref 70–99)
Glucose-Capillary: 146 mg/dL — ABNORMAL HIGH (ref 70–99)

## 2013-03-19 LAB — BASIC METABOLIC PANEL
BUN: 38 mg/dL — ABNORMAL HIGH (ref 6–23)
CO2: 35 mEq/L — ABNORMAL HIGH (ref 19–32)
Calcium: 9.1 mg/dL (ref 8.4–10.5)
Creatinine, Ser: 1.4 mg/dL — ABNORMAL HIGH (ref 0.50–1.10)
GFR calc Af Amer: 47 mL/min — ABNORMAL LOW (ref >90)
GFR calc non Af Amer: 41 mL/min — ABNORMAL LOW (ref >90)
Glucose, Bld: 178 mg/dL — ABNORMAL HIGH (ref 70–99)
Potassium: 4.7 mEq/L (ref 3.5–5.1)
Sodium: 141 mEq/L (ref 135–145)

## 2013-03-19 LAB — PHOSPHORUS: Phosphorus: 4.2 mg/dL (ref 2.3–4.6)

## 2013-03-19 NOTE — Progress Notes (Signed)
PULMONARY  / CRITICAL CARE MEDICINE  Name: [name] MRN: [id number] DOB: 1956/01/09    ADMISSION DATE:  02/24/2013 CONSULTATION DATE:  03/03/2013  REFERRING MD :  Kari Baars PRIMARY SERVICE: PCCM  CHIEF COMPLAINT:  VDRF due to OHS, pulm edema  BRIEF PATIENT DESCRIPTION:  65 yobf  admitted 10/07 to [facility] with hypercarbic failure due to OHS and CHF. Failed extubation 10/11 and difficult re-intubation (6.0 ETT). Transferred to [facility] ICU 10/14 for further eval and mgmt  SIGNIFICANT EVENTS / STUDIES:  10/7 Admitted for hypercarbic resp failure, OHS, CHF.  Intubated 10/9 transient SVT controlled with IV cardizem, transitioned to PO cardizem and coreg 10/11 pt extubated and re-intubated, steroids, difficult re-intubation used # 6 tube 10/14 transferred to [facility] ICU, likely needs trach 10/15 tracheostomy tube placed By ENT  LINES / TUBES: ETT 10/07 >> 10/11, 10/11>>>10/15 Tracheostomy(Teoh) 10/15>>>changed to 6 cuffed xlt 10/27 Foley 10/8>>>  CULTURES: MRSA PCR 10/7 >> neg 10-17 bc>> neg 10-17 sputum>>gpc,gnr,gpr> mod strep not Group A 10-17 uc>>mul species 10/24 sputum> few gnr > e coli resistant to pcn, sensitive to cipro   ANTIBIOTICS: Cipro (e coli tracheitis) 10/26 >>>  SUBJECTIVE:  Patient resting comfortably - tolerating TC well  VITAL SIGNS: Temp:  [98.2 F (36.8 C)-98.5 F (36.9 C)] 98.3 F (36.8 C) (10/30 0811) Pulse Rate:  [59-79] 69 (10/30 0811) Resp:  [13-25] 13 (10/30 0811) BP: (117-157)/(62-97) 157/80 mmHg (10/30 1006) SpO2:  [97 %-100 %] 99 % (10/30 0811) FiO2 (%):  [40 %] 40 % (10/30 0811) HEMODYNAMICS:   VENTILATOR SETTINGS: Vent Mode:  [-] PRVC FiO2 (%):  [40 %] 40 % Set Rate:  [12 bmp] 12 bmp Vt Set:  [500 mL] 500 mL PEEP:  [5 cmH20] 5 cmH20 Plateau Pressure:  [20 cmH20] 20 cmH20 INTAKE / OUTPUT: Intake/Output     10/29 0701 - 10/30 0700 10/30 0701 - 10/31 0700   NG/GT 215    IV Piggyback     Total Intake(mL/kg) 215 (1.2)    Urine  (mL/kg/hr) 2350 (0.5)    Total Output 2350     Net -2135           PHYSICAL EXAMINATION: Gen: morbidly obese, lying in bed, comfortable on  vent. HEENT: NCAT, EOMI, MMM, trach with minimal discharge, Trach unremarkable  CV: Paced rhythm S1 S2, no murmur Resp: CTA bilaterally Abd: +BS, belly soft and nontender Ext: chronic venous stasis changes on BL LE, 1+ pitting edema BL Neuro: Alert,   no focal deficits  LABS:  CBC Recent Labs     03/18/13  1356  03/19/13  0530  WBC  10.5  8.6  HGB  7.7*  7.5*  HCT  24.7*  24.0*  PLT  285  317   Coag's No results found for this basename: APTT, INR,  in the last 72 hours BMET Recent Labs     03/18/13  1356  03/19/13  0530  NA  139  141  K  4.4  4.7  CL  98  99  CO2  33*  35*  BUN  36*  38*  CREATININE  1.41*  1.40*  GLUCOSE  179*  178*   Electrolytes Recent Labs     03/18/13  1356  03/19/13  0530  CALCIUM  8.7  9.1  MG  2.0  2.1  PHOS  3.9  4.2   Sepsis Markers No results found for this basename: LACTICACIDVEN, PROCALCITON, O2SATVEN,  in the last 72 hours ABG No  results found for this basename: PHART, PCO2ART, PO2ART,  in the last 72 hours Liver Enzymes No results found for this basename: AST, ALT, ALKPHOS, BILITOT, ALBUMIN,  in the last 72 hours Cardiac Enzymes No results found for this basename: TROPONINI, PROBNP,  in the last 72 hours Glucose Recent Labs     03/18/13  1153  03/18/13  1647  03/18/13  2015  03/18/13  2337  03/19/13  0430  03/19/13  0846  GLUCAP  188*  145*  139*  146*  158*  145*   Imaging Dg Chest Port 1 View  03/18/2013   CLINICAL DATA:  Respiratory failure and shortness of breath  EXAM: PORTABLE CHEST - 1 VIEW  COMPARISON:  03/10/2013  FINDINGS: Similar orientation of a tracheostomy tube, which is tilted. There is a dual-chamber left subclavian approach pacer which is partly imaged.  Significantly worsening aeration on the left, likely with volume loss - although rotation is limiting.  Diffuse interstitial prominence is increased. There may be layering effusions.  IMPRESSION: 1. Significant worsening of left lung aeration, which may be related to extensive atelectasis, large effusion, or new pneumonia. 2. Increasing pulmonary edema, best seen on the right.   Electronically Signed   By: Tiburcio Pea M.D.   On: 03/18/2013 06:47    ASSESSMENT / PLAN:  Acute on chronic hypercarbic respiratory failure, s/p tracheostomy Obesity hypoventilation syndrome OSA (+ possible central SA) Difficult airway Trach 10/15 E coli tracheobronchitis  P:   - TC as tolerated, full vent support at night - Scheduled BDs as ordered. - Arrange for home vent. - Mobilize as tol. - family to complete vent training today  Acute on chronic diastolic CHF  Paroxsymal SVT Symptomatic bradycardia s/p PPM placement, currently paced HTN P:  - Hold diuresis currently. - Continue clonidine, hydralazine, and coreg at 3.75 mg BID.    AKI, exacerbated by diuresis - creatinine trending down Compensatory elevation in HCO3 Hypokalemia - resolved 10/22 Hypernatremia, corrected 10/25 and free water d/c P:   - Continue to hold lasix, auto diuresing. - Monitor BMET. - Correct electrolytes as indicated.   Morbid obesity P:   - SUP. - Continue TF at goal. - Swallow evaluation today and PMV trials.  Normocytic anemia- likely of chronic disease + losses from trach site P:  - Trend CBC. - Continue iron supplement.  DM2 P:   - Lantus + SSI.  Patient doing very well on trach collar during the day.  Continues to work with speech valve.  Family to continue home vent training.  Swallow evaluation today and if passes then will hopefully avoid PEG if fails then will wait and repeat of Friday prior to commitment to a PEG since patient is not ready to be discharged regardless.  SCr continues to improve and patient remains net negative without diureses.  Will check BMET and cbc today to ensure stable Hgb.    Alyson Reedy, M.D. [facility] Pulmonary/Critical Care Medicine. Pager: [phone]. After hours pager: [phone].

## 2013-03-19 NOTE — Evaluation (Signed)
Clinical/Bedside Swallow Evaluation Patient Details  Name: [name] MRN: [id number] Date of Birth: [birth year]  Today's Date: 03/19/2013 Time: 1440-1500 SLP Time Calculation (min): 20 min  Past Medical History:  Past Medical History  Diagnosis Date  . Morbid obesity   . Essential hypertension, benign   . Chronic diastolic heart failure     LVEF 60-65% 2011  . COPD (chronic obstructive pulmonary disease)     Oxygen dependent  . History of cardiac catheterization     Normal coronary arteries 2008  . Obesity hypoventilation syndrome   . Arthritis   . Hiatal hernia   . Symptomatic bradycardia     With pauses 2008 - Followed by Dr. Graciela Husbands  . Cellulitis     Recurrent bilateral LE  . Mixed hyperlipidemia   . Peripheral vascular disease   . Myocardial infarction 1990's  . Sleep apnea   . Type II diabetes mellitus   . Gout   . Nonsustained ventricular tachycardia     Documented 2008  . SVT (supraventricular tachycardia)     Reported possible atrial arrhythmias   Past Surgical History:  Past Surgical History  Procedure Laterality Date  . Abdominal hysterectomy  1980's  . Insert / replace / remove pacemaker  2010  . Tracheostomy tube placement N/A 03/04/2013    Procedure: TRACHEOSTOMY;  Surgeon: Darletta Moll, MD;  Location: [facility] OR;  Service: ENT;  Laterality: N/A;   HPI:  57 y/o female admitted 10/07 to [facility] with hypercarbic failure due to OHS and CHF. Failed extubation 10/11 and difficult re-intubation (6.0 ETT). Transferred to [facility] ICU 10/14 for further eval and mgmt.  S/p tracheostomy 10/15 due to inability to wean from vent.  Now tolerating trach collar.  Pt has been participating in PMSV trials, and MD orders received to now assess swallowing function.   Assessment / Plan / Recommendation Clinical Impression  Orders received for clinical swallow evaluation, however at this time pt remains unable to tolerate PMSV for more than a few minutes at a time. PMSV placement is  preferred for PO intake to optimize swallowing function, therefore PO trials were deferred. Oral motor eval appears WFL and pt denies any h/o dysphagia. Will continue to follow pt to assess readiness for PO trials and/or objective testing.    Aspiration Risk  Severe    Diet Recommendation NPO;Alternative means - temporary   Medication Administration: Via alternative means    Other  Recommendations Recommended Consults: Other (Comment) (will likely benefit from objective testing as PMSV tolerance increases) Oral Care Recommendations: Oral care Q4 per protocol   Follow Up Recommendations  Home health SLP    Frequency and Duration min 2x/week  2 weeks   Pertinent Vitals/Pain N/A    SLP Swallow Goals     Swallow Study Prior Functional Status       General Date of Onset: 02/24/13 HPI: 57 y/o female admitted 10/07 to [facility] with hypercarbic failure due to OHS and CHF. Failed extubation 10/11 and difficult re-intubation (6.0 ETT). Transferred to [facility] ICU 10/14 for further eval and mgmt.  S/p tracheostomy 10/15 due to inability to wean from vent.  Now tolerating trach collar.  Pt has been participating in PMSV trials, and MD orders received to now assess swallowing function. Type of Study: Bedside swallow evaluation Previous Swallow Assessment: none in chart, none reported by pt Diet Prior to this Study: NPO;Panda Temperature Spikes Noted: No Respiratory Status: Trach collar Trach Size and Type: #6;Cuff;Deflated;With PMSV in place History of  Recent Intubation: Yes (x2) Length of Intubations (days): 9 days Date extubated: 03/04/13 (received trach) Behavior/Cognition: Cooperative;Pleasant mood;Alert Oral Cavity - Dentition: Edentulous;Dentures, not available Patient Positioning: Partially reclined Baseline Vocal Quality: Other (comment) (pt with PMSV in place, low vocal intensity, strained) Volitional Cough: Other (Comment) (attempts to throat clear results in PMSV being expelled from  trach hub)    Oral/Motor/Sensory Function Overall Oral Motor/Sensory Function: Appears within functional limits for tasks assessed   Ice Chips Ice chips: Not tested   Thin Liquid Thin Liquid: Not tested    Nectar Thick Nectar Thick Liquid: Not tested   Honey Thick Honey Thick Liquid: Not tested   Puree Puree: Not tested   Solid   GO    Solid: Not tested       Maxcine Ham 03/19/2013,3:10 PM  Maxcine Ham, M.A. CCC-SLP [phone]

## 2013-03-19 NOTE — Progress Notes (Signed)
Speech Language Pathology Treatment: [name] Speaking valve  Patient Details Name: [name] MRN: [id number] DOB: Jul 14, 1955 Today's Date: 03/19/2013 Time: 1430-1440 SLP Time Calculation (min): 10 min  Assessment / Plan / Recommendation Clinical Impression  Pt seen for f/u PMSV treatment. SLP deflated cuff without incidence, and pt was able to redirect air to her upper airway with digital occlusion. Before placing the valve, pt mouthed that it is "hard to breathe and talk" with it on, however was agreeable to trials with minimal encouragement needed. Pt wore the valve for two 3-4 minute intervals with VS remaining stable and no overt evidence of CO2 retention upon removal of the valve. Pt's voice was strained with mildly reduced breath support. After a few minutes with the valve in place, pt's eyes appeared to tear up and her WOB noticeably increased. Upon removal of the valve, pt mouthed, "I can't breath" however immediately afterward said that she was fine. VS still stable. SLP reinflated cuff per RN instruction. Recommend continued use with SLP only at this time. Continue plan of care.   HPI HPI: 57 y/o female admitted 10/07 to [facility] with hypercarbic failure due to OHS and CHF. Failed extubation 10/11 and difficult re-intubation (6.0 ETT). Transferred to [facility] ICU 10/14 for further eval and mgmt.  S/p tracheostomy 10/15 due to inability to wean from vent.  Now tolerating trach collar.  Pt has been participating in PMSV trials, and MD orders received to now assess swallowing function.   Pertinent Vitals N/A  SLP Plan  Continue with current plan of care    Recommendations Diet recommendations: NPO Medication Administration: Via alternative means      Patient may use Passy-Muir Speech Valve: with SLP only PMSV Supervision: Full MD: Please consider changing trach tube to : Smaller size;Cuffless       Oral Care Recommendations: Oral care Q4 per protocol Follow up Recommendations: Home  health SLP Plan: Continue with current plan of care    GO     Maxcine Ham 03/19/2013, 3:15 PM  Maxcine Ham, M.A. CCC-SLP [phone]

## 2013-03-20 LAB — GLUCOSE, CAPILLARY
Glucose-Capillary: 151 mg/dL — ABNORMAL HIGH (ref 70–99)
Glucose-Capillary: 142 mg/dL — ABNORMAL HIGH (ref 70–99)
Glucose-Capillary: 156 mg/dL — ABNORMAL HIGH (ref 70–99)
Glucose-Capillary: 145 mg/dL — ABNORMAL HIGH (ref 70–99)

## 2013-03-20 NOTE — Progress Notes (Signed)
PULMONARY  / CRITICAL CARE MEDICINE  Name: [name] MRN: [id number] DOB: 02-10-1956    ADMISSION DATE:  02/24/2013 CONSULTATION DATE:  03/03/2013  REFERRING MD :  Kari Baars PRIMARY SERVICE: PCCM  CHIEF COMPLAINT:  VDRF due to OHS, pulm edema  BRIEF PATIENT DESCRIPTION:  46 yobf  admitted 10/07 to [facility] with hypercarbic failure due to OHS and CHF. Failed extubation 10/11 and difficult re-intubation (6.0 ETT). Transferred to [facility] ICU 10/14 for further eval and mgmt  SIGNIFICANT EVENTS / STUDIES:  10/7 Admitted for hypercarbic resp failure, OHS, CHF.  Intubated 10/9 transient SVT controlled with IV cardizem, transitioned to PO cardizem and coreg 10/11 pt extubated and re-intubated, steroids, difficult re-intubation used # 6 tube 10/14 transferred to [facility] ICU, likely needs trach 10/15 tracheostomy tube placed By ENT  LINES / TUBES: ETT 10/07 >> 10/11, 10/11>>>10/15 Tracheostomy(Teoh) 10/15>>>changed to 6 cuffed xlt 10/27 Foley 10/8>>>  CULTURES: MRSA PCR 10/7 >> neg 10-17 bc>> neg 10-17 sputum>>gpc,gnr,gpr> mod strep not Group A 10-17 uc>>mul species 10/24 sputum> few gnr > e coli resistant to pcn, sensitive to cipro   ANTIBIOTICS: Cipro (e coli tracheitis) 10/26 >>>  SUBJECTIVE:  Patient resting comfortably - tolerating TC well, failed swallow eval  VITAL SIGNS: Temp:  [97.7 F (36.5 C)-98.4 F (36.9 C)] 98.1 F (36.7 C) (10/31 0731) Pulse Rate:  [64-74] 67 (10/31 0757) Resp:  [12-18] 16 (10/31 0757) BP: (129-171)/(70-89) 154/83 mmHg (10/31 0922) SpO2:  [99 %-100 %] 100 % (10/31 0757) FiO2 (%):  [35 %-40 %] 35 % (10/31 0757) Weight:  [410 lb 14.4 oz (186.383 kg)] 410 lb 14.4 oz (186.383 kg) (10/31 0403) HEMODYNAMICS:   VENTILATOR SETTINGS: Vent Mode:  [-] PRVC FiO2 (%):  [35 %-40 %] 35 % Set Rate:  [12 bmp] 12 bmp Vt Set:  [500 mL] 500 mL PEEP:  [5 cmH20] 5 cmH20 Plateau Pressure:  [24 cmH20-31 cmH20] 31 cmH20 INTAKE / OUTPUT: Intake/Output     10/30  0701 - 10/31 0700 10/31 0701 - 11/01 0700   NG/GT 550    Total Intake(mL/kg) 550 (3)    Urine (mL/kg/hr) 1925 (0.4) 200 (0.3)   Total Output 1925 200   Net -1375 -200         PHYSICAL EXAMINATION: Gen: morbidly obese, lying in bed, comfortable on  vent. HEENT: NCAT, EOMI, MMM, trach with minimal discharge, Trach unremarkable  CV: Paced rhythm S1 S2, no murmur Resp: CTA bilaterally Abd: +BS, belly soft and nontender Ext: chronic venous stasis changes on BL LE, 1+ pitting edema BL Neuro: Alert,   no focal deficits  LABS:  CBC Recent Labs     03/18/13  1356  03/19/13  0530  WBC  10.5  8.6  HGB  7.7*  7.5*  HCT  24.7*  24.0*  PLT  285  317   Coag's No results found for this basename: APTT, INR,  in the last 72 hours BMET Recent Labs     03/18/13  1356  03/19/13  0530  NA  139  141  K  4.4  4.7  CL  98  99  CO2  33*  35*  BUN  36*  38*  CREATININE  1.41*  1.40*  GLUCOSE  179*  178*   Electrolytes Recent Labs     03/18/13  1356  03/19/13  0530  CALCIUM  8.7  9.1  MG  2.0  2.1  PHOS  3.9  4.2   Sepsis Markers No results  found for this basename: LACTICACIDVEN, PROCALCITON, O2SATVEN,  in the last 72 hours ABG No results found for this basename: PHART, PCO2ART, PO2ART,  in the last 72 hours Liver Enzymes No results found for this basename: AST, ALT, ALKPHOS, BILITOT, ALBUMIN,  in the last 72 hours Cardiac Enzymes No results found for this basename: TROPONINI, PROBNP,  in the last 72 hours Glucose Recent Labs     03/19/13  1157  03/19/13  1606  03/19/13  2020  03/20/13  0022  03/20/13  0519  03/20/13  0735  GLUCAP  161*  143*  141*  162*  151*  142*   Imaging No results found.  ASSESSMENT / PLAN:  Acute on chronic hypercarbic respiratory failure, s/p tracheostomy Obesity hypoventilation syndrome OSA (+ possible central SA) Difficult airway Trach 10/15 E coli tracheobronchitis  P:   - TC as tolerated, full vent support at night - Scheduled BDs  as ordered. - Arrange for home vent. - Mobilize as tol. - family to complete vent training today - Day 5 Cipro  Acute on chronic diastolic CHF  Paroxsymal SVT Symptomatic bradycardia s/p PPM placement, currently paced HTN P:  - Hold diuresis currently - Continue clonidine, hydralazine, and coreg at 3.75 mg BID    AKI, exacerbated by diuresis - creatinine trending down Compensatory elevation in HCO3 Hypokalemia - resolved 10/22 Hypernatremia, corrected 10/25 and free water d/c P:   - Continue to hold lasix, auto diuresing. - Monitor BMET. - Correct electrolytes as indicated.   Morbid obesity P:   - SUP. - consult IR for PEG tube, failed swallow eval - Continue TF at goal.   Normocytic anemia- likely of chronic disease + losses from trach site P:  - Trend CBC - Continue iron supplement.  DM2 P:   - Lantus + SSI.  Carley Hammed Physician Assistant Student 03/20/13  Patient and family continue vent training.  Failed swallow eval.  Will consult IR for PEG tube placement.      Patient seen and examined, agree with above note.  I dictated the care and orders written for this patient under my direction.  Alyson Reedy, MD [phone]

## 2013-03-20 NOTE — Progress Notes (Addendum)
IR PA aware of percutaneous gastric tube request. Patient admitted with respiratory failure s/p tracheostomy due to inability to wean from vent. Patient with NG tube in place and unable to tolerate PMSV for more than a few minutes at a time. PO trials during swallow evaluation were deferred. Patient placed on NPO diet with alternative means until able to further test PO capabilities. Request for percutaneous gastric tube received,the patient's images were reviewed by Dr. Lowella Dandy. I have spoke to the patient regarding the procedure and she would like to think about it. She states she does not wish to have a gastric tube placed at this time.   [name] Boss PA-C Interventional Radiology  03/20/13  12:14 PM

## 2013-03-20 NOTE — Progress Notes (Signed)
NUTRITION FOLLOW UP  Intervention:    Continue current EN regimen RD to follow for nutrition care plan  Nutrition Dx:   Inadequate oral intake related to inability to eat as evidenced by NPO status, ongoing  Goal:   EN to provide 60-70% of estimated calorie needs (22-25 kcals/kg ideal body weight) and 100% of estimated protein needs, based on ASPEN guidelines for permissive underfeeding in critically ill obese individuals, met  Monitor:   EN regimen & tolerance, respiratory status, weight, labs, I/O's  Assessment:   Patient admitted 10/7 to [facility] with hypercarbic failure due to OHS and CHF. Failed extubation 10/11 and difficult re-intubation. Transferred from [facility] to [facility] on 10/14. S/P tracheostomy on 10/15.   Patient is currently on ventilator support -- trach  MV: 6.4 Temp: 36.9  Glucerna 1.2 formula infusing at goal rate of 50 ml/hr via NGT with Prostat liquid protein 30 ml TID providing 1740 kcals, 117 gm protein, 966 ml of free water.   Patient s/p bedside swallow evaluation 10/30 -- SLP recommending continued NPO status.  PA-C note (IR) reviewed, patient to "think about" PEG tube placement.  Height: Ht Readings from Last 1 Encounters:  03/03/13 5' (1.524 m)    Weight Status --- fluctuating  Wt Readings from Last 1 Encounters:  03/20/13 410 lb 14.4 oz (186.383 kg)    Body mass index is 80.25 kg/(m^2).  Re-estimated needs:  Kcal: 2420 Protein: 110-120 gm Fluid: 2.4 L  Skin: neck incision   Diet Order: NPO   Intake/Output Summary (Last 24 hours) at 03/20/13 1530 Last data filed at 03/20/13 0737  Gross per 24 hour  Intake    550 ml  Output   1475 ml  Net   -925 ml    Labs:   Recent Labs Lab 03/16/13 0450 03/18/13 1356 03/19/13 0530  NA 140 139 141  K 4.3 4.4 4.7  CL 98 98 99  CO2 32 33* 35*  BUN 42* 36* 38*  CREATININE 1.54* 1.41* 1.40*  CALCIUM 8.5 8.7 9.1  MG  --  2.0 2.1  PHOS  --  3.9 4.2  GLUCOSE 156* 179* 178*    CBG (last 3)   Recent  Labs  03/20/13 0519 03/20/13 0735 03/20/13 1242  GLUCAP 151* 142* 156*    Scheduled Meds: . albuterol  2.5 mg Nebulization Q4H  . antiseptic oral rinse  15 mL Mouth Rinse q12n4p  . carvedilol  3.125 mg Oral BID WC  . chlorhexidine  15 mL Mouth Rinse BID  . ciprofloxacin  400 mg Intravenous Q12H  . cloNIDine  0.3 mg Oral TID  . famotidine  20 mg Per Tube BID  . feeding supplement (PRO-STAT SUGAR FREE 64)  30 mL Per Tube TID  . ferrous sulfate  300 mg Per Tube BID WC  . heparin subcutaneous  5,000 Units Subcutaneous Q8H  . hydrALAZINE  100 mg Oral Q6H  . insulin aspart  0-20 Units Subcutaneous Q4H  . insulin aspart  3 Units Subcutaneous Q4H  . insulin glargine  40 Units Subcutaneous Daily  . nystatin   Topical BID    Continuous Infusions: . sodium chloride 10 mL/hr at 03/17/13 0700  . feeding supplement (GLUCERNA 1.2 CAL) 1,000 mL (03/19/13 2000)    Maureen Chatters, RD, LDN Pager #: [phone] After-Hours Pager #: [phone]

## 2013-03-21 ENCOUNTER — Inpatient Hospital Stay (HOSPITAL_COMMUNITY): Payer: Medicaid Other

## 2013-03-21 LAB — BASIC METABOLIC PANEL
BUN: 41 mg/dL — ABNORMAL HIGH (ref 6–23)
Calcium: 9.1 mg/dL (ref 8.4–10.5)
Chloride: 97 mEq/L (ref 96–112)
GFR calc Af Amer: 52 mL/min — ABNORMAL LOW (ref >90)
GFR calc non Af Amer: 45 mL/min — ABNORMAL LOW (ref >90)
Glucose, Bld: 166 mg/dL — ABNORMAL HIGH (ref 70–99)
Potassium: 5.1 mEq/L (ref 3.5–5.1)

## 2013-03-21 LAB — GLUCOSE, CAPILLARY
Glucose-Capillary: 146 mg/dL — ABNORMAL HIGH (ref 70–99)
Glucose-Capillary: 156 mg/dL — ABNORMAL HIGH (ref 70–99)

## 2013-03-21 MED ORDER — SODIUM BICARBONATE 650 MG PO TABS
650.0000 mg | ORAL_TABLET | Freq: Once | ORAL | Status: AC
  Administered 2013-03-21: 650 mg via ORAL
  Filled 2013-03-21: qty 1

## 2013-03-21 MED ORDER — PANCRELIPASE (LIP-PROT-AMYL) 12000-38000 UNITS PO CPEP
2.0000 | ORAL_CAPSULE | Freq: Once | ORAL | Status: AC
  Administered 2013-03-21: 2 via ORAL
  Filled 2013-03-21: qty 2

## 2013-03-21 MED ORDER — HYDRALAZINE HCL 20 MG/ML IJ SOLN: 10.0000 mg | INTRAMUSCULAR | Status: DC | PRN

## 2013-03-21 NOTE — Progress Notes (Signed)
PULMONARY  / CRITICAL CARE MEDICINE  Name: [name] MRN: [id number] DOB: [birth year]    ADMISSION DATE:  02/24/2013 CONSULTATION DATE:  03/03/2013  REFERRING MD :  Kari Baars PRIMARY SERVICE: PCCM  CHIEF COMPLAINT:  VDRF due to OHS, pulm edema  BRIEF PATIENT DESCRIPTION:  62 yobf  admitted 10/07 to [facility] with hypercarbic failure due to OHS and CHF. Failed extubation 10/11 and difficult re-intubation (6.0 ETT). Transferred to [facility] ICU 10/14 for further eval and mgmt  SIGNIFICANT EVENTS / STUDIES:  10/7 Admitted for hypercarbic resp failure, OHS, CHF.  Intubated 10/9 transient SVT controlled with IV cardizem, transitioned to PO cardizem and coreg 10/11 pt extubated and re-intubated, steroids, difficult re-intubation used # 6 tube 10/14 transferred to [facility] ICU, likely needs trach 10/15 tracheostomy tube placed By ENT  LINES / TUBES: ETT 10/07 >> 10/11, 10/11>>>10/15 Tracheostomy(Teoh) 10/15>>>changed to 6 cuffed xlt 10/27 Foley 10/8>>>  CULTURES: MRSA PCR 10/7 >> neg 10-17 bc>> neg 10-17 sputum>>gpc,gnr,gpr> mod strep not Group A 10-17 uc>>mul species 10/24 sputum> few gnr > e coli resistant to pcn, sensitive to cipro   ANTIBIOTICS: Cipro (e coli tracheitis) 10/26 >>>  SUBJECTIVE:  Patient resting comfortably - tolerating TC well, failed swallow eval  VITAL SIGNS: Temp:  [98 F (36.7 C)-98.9 F (37.2 C)] 98 F (36.7 C) (11/01 0802) Pulse Rate:  [60-72] 69 (11/01 0835) Resp:  [12-19] 16 (11/01 0835) BP: (124-166)/(74-142) 149/78 mmHg (11/01 0817) SpO2:  [99 %-100 %] 100 % (11/01 0835) FiO2 (%):  [35 %-40 %] 35 % (11/01 0835) Weight:  [176.9 kg (389 lb 15.9 oz)] 176.9 kg (389 lb 15.9 oz) (11/01 0500) HEMODYNAMICS:   VENTILATOR SETTINGS: Vent Mode:  [-] PRVC FiO2 (%):  [35 %-40 %] 35 % Set Rate:  [12 bmp] 12 bmp Vt Set:  [500 mL] 500 mL PEEP:  [5 cmH20] 5 cmH20 Plateau Pressure:  [24 cmH20-31 cmH20] 26 cmH20  INTAKE / OUTPUT: Intake/Output     10/31 0701  - 11/01 0700 11/01 0701 - 11/02 0700   NG/GT 550 160   Total Intake(mL/kg) 550 (3.1) 160 (0.9)   Urine (mL/kg/hr) 850 (0.2) 140 (0.3)   Total Output 850 140   Net -300 +20         PHYSICAL EXAMINATION: Gen: morbidly obese, lying in bed, comfortable on  vent. HEENT: NCAT, EOMI, MMM, trach with minimal discharge, Trach unremarkable  CV: Paced rhythm S1 S2, no murmur Resp: CTA bilaterally Abd: +BS, belly soft and nontender Ext: chronic venous stasis changes on BL LE, 1+ pitting edema BL Neuro: Alert,   no focal deficits  LABS:  CBC Recent Labs     03/18/13  1356  03/19/13  0530  WBC  10.5  8.6  HGB  7.7*  7.5*  HCT  24.7*  24.0*  PLT  285  317   Coag's No results found for this basename: APTT, INR,  in the last 72 hours BMET Recent Labs     03/18/13  1356  03/19/13  0530  03/21/13  0544  NA  139  141  137  K  4.4  4.7  5.1  CL  98  99  97  CO2  33*  35*  30  BUN  36*  38*  41*  CREATININE  1.41*  1.40*  1.29*  GLUCOSE  179*  178*  166*   Electrolytes Recent Labs     03/18/13  1356  03/19/13  0530  03/21/13  0544  CALCIUM  8.7  9.1  9.1  MG  2.0  2.1   --   PHOS  3.9  4.2   --    Sepsis Markers No results found for this basename: LACTICACIDVEN, PROCALCITON, O2SATVEN,  in the last 72 hours ABG No results found for this basename: PHART, PCO2ART, PO2ART,  in the last 72 hours Liver Enzymes No results found for this basename: AST, ALT, ALKPHOS, BILITOT, ALBUMIN,  in the last 72 hours Cardiac Enzymes No results found for this basename: TROPONINI, PROBNP,  in the last 72 hours Glucose Recent Labs     03/20/13  1242  03/20/13  1659  03/20/13  2036 03/21/13  03/21/13  0437  03/21/13  0810  GLUCAP  156*  145*  139*  145*  163*  146*   Imaging:  CXR 10/29:  IMPRESSION:  1. Significant worsening of left lung aeration, which may be related  to extensive atelectasis, large effusion, or new pneumonia.  2. Increasing pulmonary edema, best seen on the  right.    ASSESSMENT / PLAN:  Acute on chronic hypercarbic respiratory failure, s/p tracheostomy Obesity hypoventilation syndrome OSA (+ possible central SA) Difficult airway Trach 10/15 E coli tracheobronchitis  P:   - TC as tolerated, full vent support at night - Scheduled BDs as ordered. - Arrange for home vent. - Mobilize as tol. - family to complete vent training - on Cipro 400mg  IV Q12h (D6 today 11/1)  Acute on chronic diastolic CHF  Paroxsymal SVT Symptomatic bradycardia s/p PPM placement, currently paced HTN P:  - Hold diuresis currently - Continue clonidine, hydralazine, and coreg at 3.125 mg BID    AKI, exacerbated by diuresis - creatinine trending down Compensatory elevation in HCO3 Hypokalemia - resolved 10/22 Hypernatremia, corrected 10/25 and free water d/c P:   - Continue to hold lasix, auto diuresing. - Monitor BMET. - Correct electrolytes as indicated.   Morbid obesity P:   - SUP. - consult IR for PEG tube, failed swallow eval - Continue TF at goal.  Normocytic anemia- likely of chronic disease + losses from trach site P:  - Trend CBC - Continue iron supplement.  DM2 P:   - Lantus + SSI.  Plan to f/u CXR & labs 11/2=> ordered  Michele Mcalpine, MD 03/21/13 @ 9:51AM [phone]

## 2013-03-21 NOTE — Progress Notes (Signed)
Nasogastric tube noted to be clogged at 15:30.  Attempted declogging with Diet [employer], unsuccessful.  Second MICU RN called to assess and assist, attempted declogging again usuccessful.  Attempts with meds per declogging protocol unsuccessful.  The patient refuses insertion of new nasogastric tube.  MD notified.  Will continue to monitor patient closely.

## 2013-03-21 NOTE — Progress Notes (Signed)
Speech Language Pathology Treatment: [name] Speaking valve  Patient Details Name: [name] MRN: [id number] DOB: [birth year] Today's Date: 03/21/2013 Time: 7829-5621 SLP Time Calculation (min): 15 min  Assessment / Plan / Recommendation Clinical Impression  F/u for PMSV use.  Patient able to tolerate PMSV in place for 2-3 minute intervals without decline in vital signs.  Continues to demonstrate some anxiety with valve use, describing occasional pressure and asking for valve's removal due to perceived difficulty breathing.  Quality of phonation remains hoarse/harsh. SLP provided moderate cueing for coordination of deep breath and phonatory output given moderately decreased breath support.   Pt with ice chips at bedside.  Provided with limited amounts with valve in place.  Demonstrates adequate mastication and palpable swallow response.  Pt is ready for instrumental swallow study despite inconsistent use of PMSV.  Note that IR has been contacted for PEG .  Recommend proceeding with FEES exam to determine potential for resumption of PO diet.      HPI HPI: 57 y/o female admitted 10/07 to [facility] with hypercarbic failure due to OHS and CHF. Failed extubation 10/11 and difficult re-intubation (6.0 ETT). Transferred to [facility] ICU 10/14 for further eval and mgmt.  S/p tracheostomy 10/15 due to inability to wean from vent.  Now tolerating trach collar.  Pt has been participating in PMSV trials, and MD orders received to now assess swallowing function.      SLP Plan    PLEASE ORDER FEES FOR Monday, 11/3   Recommendations        Patient may use Passy-Muir Speech Valve: with SLP only PMSV Supervision: Full       Oral Care Recommendations: Oral care Q4 per protocol Follow up Recommendations: Home health SLP    [name] L. Samson Frederic, [place] CCC/SLP Pager [phone]      [name] 03/21/2013, 3:33 PM

## 2013-03-22 ENCOUNTER — Inpatient Hospital Stay (HOSPITAL_COMMUNITY): Payer: Medicaid Other

## 2013-03-22 LAB — CBC
Hemoglobin: 8.6 g/dL — ABNORMAL LOW (ref 12.0–15.0)
MCV: 91.3 fL (ref 78.0–100.0)
RBC: 3 MIL/uL — ABNORMAL LOW (ref 3.87–5.11)
RDW: 16.8 % — ABNORMAL HIGH (ref 11.5–15.5)

## 2013-03-22 LAB — COMPREHENSIVE METABOLIC PANEL
ALT: 25 U/L (ref 0–35)
Alkaline Phosphatase: 95 U/L (ref 39–117)
BUN: 37 mg/dL — ABNORMAL HIGH (ref 6–23)
CO2: 31 mEq/L (ref 19–32)
Chloride: 96 mEq/L (ref 96–112)
Creatinine, Ser: 1.35 mg/dL — ABNORMAL HIGH (ref 0.50–1.10)
GFR calc Af Amer: 49 mL/min — ABNORMAL LOW (ref >90)
GFR calc non Af Amer: 43 mL/min — ABNORMAL LOW (ref >90)
Glucose, Bld: 154 mg/dL — ABNORMAL HIGH (ref 70–99)
Potassium: 4.8 mEq/L (ref 3.5–5.1)
Sodium: 136 mEq/L (ref 135–145)

## 2013-03-22 LAB — GLUCOSE, CAPILLARY
Glucose-Capillary: 137 mg/dL — ABNORMAL HIGH (ref 70–99)
Glucose-Capillary: 154 mg/dL — ABNORMAL HIGH (ref 70–99)
Glucose-Capillary: 161 mg/dL — ABNORMAL HIGH (ref 70–99)

## 2013-03-22 LAB — PRO B NATRIURETIC PEPTIDE: Pro B Natriuretic peptide (BNP): 1138 pg/mL — ABNORMAL HIGH (ref 0–125)

## 2013-03-22 NOTE — Progress Notes (Signed)
[name] from [employer] in to interrogate pacemaker. No changes made at this time. Report placed in chart.

## 2013-03-22 NOTE — Progress Notes (Signed)
PULMONARY  / CRITICAL CARE MEDICINE  Name: [name] MRN: [id number] DOB: 06-Oct-1955    ADMISSION DATE:  02/24/2013 CONSULTATION DATE:  03/03/2013  REFERRING MD :  Kari Baars PRIMARY SERVICE: PCCM  CHIEF COMPLAINT:  VDRF due to OHS, pulm edema  BRIEF PATIENT DESCRIPTION:  48 yobf  admitted 10/07 to [facility] with hypercarbic failure due to OHS and CHF. Failed extubation 10/11 and difficult re-intubation (6.0 ETT). Transferred to [facility] ICU 10/14 for further eval and mgmt  SIGNIFICANT EVENTS / STUDIES:  10/7 Admitted for hypercarbic resp failure, OHS, CHF.  Intubated 10/9 transient SVT controlled with IV cardizem, transitioned to PO cardizem and coreg 10/11 pt extubated and re-intubated, steroids, difficult re-intubation used # 6 tube 10/14 transferred to [facility] ICU, likely needs trach 10/15 tracheostomy tube placed By ENT  LINES / TUBES: ETT 10/07 >> 10/11, 10/11>>>10/15 Tracheostomy(Teoh) 10/15>>>changed to 6 cuffed xlt 10/27 Foley 10/8>>>  CULTURES: MRSA PCR 10/7 >> neg 10-17 bc>> neg 10-17 sputum>>gpc,gnr,gpr> mod strep not Group A 10-17 uc>>mul species 10/24 sputum> few gnr > e coli resistant to pcn, sensitive to cipro   ANTIBIOTICS: Cipro (e coli tracheitis) 10/26 >>>  SUBJECTIVE:  Patient resting comfortably - tolerating TC well, failed swallow eval  VITAL SIGNS: Temp:  [98 F (36.7 C)-98.7 F (37.1 C)] 98.1 F (36.7 C) (11/02 0812) Pulse Rate:  [59-73] 60 (11/02 0812) Resp:  [15-20] 16 (11/02 0812) BP: (117-190)/(67-107) 146/74 mmHg (11/02 0812) SpO2:  [100 %] 100 % (11/02 0812) FiO2 (%):  [35 %-40 %] 40 % (11/02 0812) Weight:  [178.8 kg (394 lb 2.9 oz)] 178.8 kg (394 lb 2.9 oz) (11/02 0429) HEMODYNAMICS:   VENTILATOR SETTINGS: Vent Mode:  [-] PRVC FiO2 (%):  [35 %-40 %] 40 % Vt Set:  [500 mL] 500 mL PEEP:  [5 cmH20] 5 cmH20 Plateau Pressure:  [21 cmH20-27 cmH20] 21 cmH20  INTAKE / OUTPUT: Intake/Output     11/01 0701 - 11/02 0700 11/02 0701 - 11/03  0700   NG/GT 935    Total Intake(mL/kg) 935 (5.2)    Urine (mL/kg/hr) 2140 (0.5) 375 (0.9)   Total Output 2140 375   Net -1205 -375         PHYSICAL EXAMINATION: Gen: morbidly obese, lying in bed, comfortable on  vent. HEENT: NCAT, EOMI, MMM, trach with minimal discharge, Trach unremarkable  CV: Paced rhythm S1 S2, no murmur Resp: CTA bilaterally Abd: +BS, belly soft and nontender Ext: chronic venous stasis changes on BL LE, 1+ pitting edema BL Neuro: Alert,   no focal deficits  LABS:  CBC Recent Labs     03/22/13  0440  WBC  6.9  HGB  8.6*  HCT  27.4*  PLT  411*   Coag's No results found for this basename: APTT, INR,  in the last 72 hours BMET Recent Labs     03/21/13  0544  03/22/13  0440  NA  137  136  K  5.1  4.8  CL  97  96  CO2  30  31  BUN  41*  37*  CREATININE  1.29*  1.35*  GLUCOSE  166*  154*   Electrolytes Recent Labs     03/21/13  0544  03/22/13  0440  CALCIUM  9.1  9.1   Liver Enzymes Recent Labs     03/22/13  0440  AST  25  ALT  25  ALKPHOS  95  BILITOT  0.2*  ALBUMIN  2.5*   Cardiac Enzymes  Recent Labs     03/22/13  0440  PROBNP  1138.0*   Glucose Recent Labs     03/21/13  0810  03/21/13  1233  03/21/13  1730  03/21/13  2019  03/21/13  2347  03/22/13  0431  GLUCAP  146*  156*  136*  126*  154*  158*   Imaging:  CXR 11/2:  IMPRESSION:  Slightly improved aeration in the left lung. Diffuse interstitial lung densities are suggestive for edema.  CXR 10/29:  IMPRESSION:  1. Significant worsening of left lung aeration, which may be related  to extensive atelectasis, large effusion, or new pneumonia.  2. Increasing pulmonary edema, best seen on the right.    ASSESSMENT / PLAN:  Acute on chronic hypercarbic respiratory failure, s/p tracheostomy Obesity hypoventilation syndrome OSA (+ possible central SA) Difficult airway Trach 10/15 E coli tracheobronchitis  P:   - TC as tolerated, full vent support at night -  Scheduled BDs as ordered. - Arrange for home vent. - Mobilize as tol. - family to complete vent training - on Cipro 400mg  IV Q12h (D7 today 11/2)  Acute on chronic diastolic CHF  Paroxsymal SVT Symptomatic bradycardia s/p PPM placement, currently paced HTN P:  - Hold diuresis currently - Continue clonidine, hydralazine, and coreg at 3.125 mg BID    AKI, exacerbated by diuresis - creatinine trending down Compensatory elevation in HCO3 Hypokalemia - resolved 10/22 Hypernatremia, corrected 10/25 and free water d/c P:   - Continue to hold lasix, auto diuresing. - Monitor BMET. - Correct electrolytes as indicated.   Morbid obesity P:   - SUP. - consult IR for PEG tube, failed swallow eval - Continue TF at goal.  Normocytic anemia- likely of chronic disease + losses from trach site P:  - Trend CBC - Continue iron supplement.  DM2 P:   - Lantus + SSI.    Michele Mcalpine, MD 03/22/13 @ 9:14AM

## 2013-03-22 NOTE — Progress Notes (Signed)
Spoke with [employer] Technical brewer and explained situation with pt's device. She will be in within the hour to interrogate device. Dr. De Burrs made aware.

## 2013-03-22 NOTE — Progress Notes (Signed)
Rec'd call from Centralized Telemetry informing me that pt's pacemaker is intermittently  improperly firing in the QRS complex. Dr. De Burrs notified. Instructed to call [employer].

## 2013-03-22 NOTE — Progress Notes (Signed)
eLink Physician-Brief Progress Note Patient Name: [name] DOB: [birth year] MRN: [id number]  Date of Service  03/22/2013   HPI/Events of Note   Cardiac pacemaker might be misfiring per RN Patient is asymptomatic   eICU Interventions   Pacemaker interrogation tonight RN to call when completed    Intervention Category Major Interventions: Arrhythmia - evaluation and management  [name] 03/22/2013, 9:18 PM

## 2013-03-23 DIAGNOSIS — Z930 Tracheostomy status: Secondary | ICD-10-CM

## 2013-03-23 LAB — GLUCOSE, CAPILLARY
Glucose-Capillary: 127 mg/dL — ABNORMAL HIGH (ref 70–99)
Glucose-Capillary: 129 mg/dL — ABNORMAL HIGH (ref 70–99)
Glucose-Capillary: 151 mg/dL — ABNORMAL HIGH (ref 70–99)
Glucose-Capillary: 160 mg/dL — ABNORMAL HIGH (ref 70–99)
Glucose-Capillary: 161 mg/dL — ABNORMAL HIGH (ref 70–99)
Glucose-Capillary: 165 mg/dL — ABNORMAL HIGH (ref 70–99)
Glucose-Capillary: 167 mg/dL — ABNORMAL HIGH (ref 70–99)

## 2013-03-23 MED ORDER — RESOURCE THICKENUP CLEAR PO POWD
ORAL | Status: DC | PRN
  Filled 2013-03-23: qty 125

## 2013-03-23 NOTE — Progress Notes (Addendum)
SPEECH PATHOLOGY   Educated pt. To FEES procedure and she agrees.  Scheduled for today at 1500.  Case Manager reports pt. Refused PEG.  MD, please order FEES.  Thanks.  Maryjo Rochester T CCC-SLP

## 2013-03-23 NOTE — Progress Notes (Signed)
Chaplain to visit patient. Offered spiritual care but patient was not able to speak. Offered a prayer. Will follow up later.  Chaplain Jones Skene

## 2013-03-23 NOTE — Procedures (Signed)
Objective Swallowing Evaluation: Fiberoptic Endoscopic Evaluation of Swallowing  Patient Details  Name: [name] MRN: [id number] Date of Birth: February 12, 1956  Today's Date: 03/23/2013 Time: 6962-9528 SLP Time Calculation (min): 36 min  Past Medical History:  Past Medical History  Diagnosis Date  . Morbid obesity   . Essential hypertension, benign   . Chronic diastolic heart failure     LVEF 60-65% 2011  . COPD (chronic obstructive pulmonary disease)     Oxygen dependent  . History of cardiac catheterization     Normal coronary arteries 2008  . Obesity hypoventilation syndrome   . Arthritis   . Hiatal hernia   . Symptomatic bradycardia     With pauses 2008 - Followed by Dr. Graciela Husbands  . Cellulitis     Recurrent bilateral LE  . Mixed hyperlipidemia   . Peripheral vascular disease   . Myocardial infarction 1990's  . Sleep apnea   . Type II diabetes mellitus   . Gout   . Nonsustained ventricular tachycardia     Documented 2008  . SVT (supraventricular tachycardia)     Reported possible atrial arrhythmias   Past Surgical History:  Past Surgical History  Procedure Laterality Date  . Abdominal hysterectomy  1980's  . Insert / replace / remove pacemaker  2010  . Tracheostomy tube placement N/A 03/04/2013    Procedure: TRACHEOSTOMY;  Surgeon: Darletta Moll, MD;  Location: [facility] OR;  Service: ENT;  Laterality: N/A;   HPI:  57 y/o female admitted 10/07 to [facility] with hypercarbic failure due to OHS and CHF. Failed extubation 10/11 and difficult re-intubation (6.0 ETT). Transferred to [facility] ICU 10/14 for further eval and mgmt.  S/p tracheostomy 10/15 due to inability to wean from vent.  Now tolerating trach collar.  Pt has been participating in PMSV trials, and MD orders received to now assess swallowing function.     Assessment / Plan / Recommendation Clinical Impression  Dysphagia Diagnosis: Mild pharyngeal phase dysphagia Clinical impression: Pt. exhibits a mild sensori-motor  pharyngeal dysphagia characterized by delayed swallow initiation, with penetration and silent aspiration of thin liquids prior to and during the swallow.  Pt. was able to tolerate nectar thick liquids without aspriation, and only mild residue in the pyriforms, which pt. cleared with second dry swallow spontaneously.  Pt. did not wear the PMSV for this exam, as she has had perception of increased WOB with PMSV and requested not to wear the valve.  Also, the [facility] cannot be elevated to upright position, and this may contribute to aspiration as well.    Treatment Recommendation  Therapy as outlined in treatment plan below    Diet Recommendation Dysphagia 3 (Mechanical Soft);Nectar-thick liquid   Liquid Administration via: Straw;Cup Medication Administration: Whole meds with puree Supervision: Staff to assist with self feeding;Full supervision/cueing for compensatory strategies Compensations: Slow rate;Small sips/bites;Multiple dry swallows after each bite/sip Postural Changes and/or Swallow Maneuvers: Out of bed for meals;Seated upright 90 degrees;Upright 30-60 min after meal    Other  Recommendations Oral Care Recommendations: Oral care Q4 per protocol Other Recommendations: Order thickener from pharmacy;Prohibited food (jello, ice cream, thin soups);Have oral suction available;Place PMSV during PO intake;Clarify dietary restrictions   Follow Up Recommendations  Skilled Nursing facility;LTACH    Frequency and Duration min 2x/week  2 weeks   Pertinent Vitals/Pain n/a    SLP Swallow Goals  See care plan    General HPI: 57 y/o female admitted 10/07 to [facility] with hypercarbic failure due to OHS and CHF.  Failed extubation 10/11 and difficult re-intubation (6.0 ETT). Transferred to [facility] ICU 10/14 for further eval and mgmt.  S/p tracheostomy 10/15 due to inability to wean from vent.  Now tolerating trach collar.  Pt has been participating in PMSV trials, and MD orders received to now assess swallowing  function. Type of Study: Fiberoptic Endoscopic Evaluation of Swallowing Reason for Referral: Objectively evaluate swallowing function Previous Swallow Assessment: none in chart, none reported by pt Diet Prior to this Study: NPO;Panda Temperature Spikes Noted: No Respiratory Status: Trach collar Trach Size and Type: #6;Cuff;Inflated History of Recent Intubation: Yes Length of Intubations (days): 9 days Date extubated: 03/04/13 Behavior/Cognition: Alert;Cooperative Oral Cavity - Dentition: Dentures, top;Dentures, bottom Self-Feeding Abilities: Needs assist Patient Positioning: Partially reclined (Bariatric bed will not elevate past 75%) Baseline Vocal Quality: Aphonic (pt. did not want to wear PMSV during FEES) Volitional Cough: Other (Comment) Volitional Swallow: Unable to elicit Anatomy: Other (Comment) (excess tissue) Pharyngeal Secretions: Normal    Reason for Referral Objectively evaluate swallowing function   Oral Phase Oral Preparation/Oral Phase Oral Phase: WFL   Pharyngeal Phase Pharyngeal Phase Pharyngeal Phase: Impaired Pharyngeal - Thin Pharyngeal - Thin Teaspoon: Delayed swallow initiation;Premature spillage to pyriform sinuses;Penetration/Aspiration before swallow;Penetration/Aspiration during swallow;Pharyngeal residue - pyriform sinuses Penetration/Aspiration details (thin teaspoon): Material enters airway, passes BELOW cords without attempt by patient to eject out (silent aspiration)  Cervical Esophageal Phase    GO    Cervical Esophageal Phase Cervical Esophageal Phase: Vicente Masson T 03/23/2013, 4:25 PM

## 2013-03-23 NOTE — Progress Notes (Signed)
PULMONARY  / CRITICAL CARE MEDICINE  Name: EMILEA GOGA MRN: [id number] DOB: 30-Sep-1955    ADMISSION DATE:  02/24/2013 CONSULTATION DATE:  03/03/2013  REFERRING MD :  Kari Baars PRIMARY SERVICE: PCCM  CHIEF COMPLAINT:  VDRF due to OHS, pulm edema  BRIEF PATIENT DESCRIPTION:  40 yobf  admitted 10/07 to [facility] with hypercarbic failure due to OHS and CHF. Failed extubation 10/11 and difficult re-intubation (6.0 ETT). Transferred to [facility] ICU 10/14 for further eval and mgmt  SIGNIFICANT EVENTS / STUDIES:  10/7 Admitted for hypercarbic resp failure, OHS, CHF.  Intubated 10/9 transient SVT controlled with IV cardizem, transitioned to PO cardizem and coreg 10/11 pt extubated and re-intubated, steroids, difficult re-intubation used # 6 tube 10/14 transferred to [facility] ICU, likely needs trach 10/15 tracheostomy tube placed By ENT  LINES / TUBES: ETT 10/07 >> 10/11, 10/11>>>10/15 Tracheostomy(Teoh) 10/15>>>changed to 6 cuffed xlt 10/27 Foley 10/8>>>  CULTURES: MRSA PCR 10/7 >> neg 10-17 bc>> neg 10-17 sputum>>gpc,gnr,gpr> mod strep not Group A 10-17 uc>>mul species 10/24 sputum>  e coli resistant to pcn, sensitive to cipro   ANTIBIOTICS: Cipro (e coli tracheitis) 10/26 >>>  SUBJECTIVE:  Patient resting comfortably - tolerating TC well afebrile  VITAL SIGNS: Temp:  [98.2 F (36.8 C)-98.6 F (37 C)] 98.2 F (36.8 C) (11/03 0838) Pulse Rate:  [58-70] 60 (11/03 1137) Resp:  [14-19] 16 (11/03 1137) BP: (124-159)/(67-95) 157/95 mmHg (11/03 1137) SpO2:  [99 %-100 %] 100 % (11/03 1137) FiO2 (%):  [40 %] 40 % (11/03 1137) HEMODYNAMICS:   VENTILATOR SETTINGS: Vent Mode:  [-] PRVC FiO2 (%):  [40 %] 40 % Vt Set:  [500 mL] 500 mL PEEP:  [5 cmH20] 5 cmH20 Plateau Pressure:  [21 cmH20-25 cmH20] 21 cmH20  INTAKE / OUTPUT: Intake/Output     11/02 0701 - 11/03 0700 11/03 0701 - 11/04 0700   NG/GT 1100    Total Intake(mL/kg) 1100 (6.2)    Urine (mL/kg/hr) 2175 (0.5)    Total  Output 2175     Net -1075           PHYSICAL EXAMINATION: Gen: morbidly obese, lying in bed, comfortable on  vent. HEENT: NCAT, EOMI, MMM, trach with minimal discharge, Trach unremarkable  CV: Paced rhythm S1 S2, no murmur Resp: CTA bilaterally Abd: +BS, belly soft and nontender Ext: chronic venous stasis changes on BL LE, 1+ pitting edema BL Neuro: Alert,   no focal deficits  LABS:  CBC Recent Labs     03/22/13  0440  WBC  6.9  HGB  8.6*  HCT  27.4*  PLT  411*   Coag's No results found for this basename: APTT, INR,  in the last 72 hours BMET Recent Labs     03/21/13  0544  03/22/13  0440  NA  137  136  K  5.1  4.8  CL  97  96  CO2  30  31  BUN  41*  37*  CREATININE  1.29*  1.35*  GLUCOSE  166*  154*   Electrolytes Recent Labs     03/21/13  0544  03/22/13  0440  CALCIUM  9.1  9.1   Liver Enzymes Recent Labs     03/22/13  0440  AST  25  ALT  25  ALKPHOS  95  BILITOT  0.2*  ALBUMIN  2.5*   Cardiac Enzymes Recent Labs     03/22/13  0440  PROBNP  1138.0*   Glucose Recent Labs  03/22/13  1636  03/22/13  2020  03/22/13  2341  03/23/13  0448  03/23/13  0842  03/23/13  1205  GLUCAP  137*  161*  129*  165*  151*  167*   Imaging:  CXR 11/2:  IMPRESSION:  Slightly improved aeration in the left lung. Diffuse interstitial lung densities are suggestive for edema.      ASSESSMENT / PLAN:  Acute on chronic hypercarbic respiratory failure, s/p tracheostomy Obesity hypoventilation syndrome OSA (+ possible central SA) Difficult airway Trach 10/15 E coli tracheobronchitis  P:   - TC as tolerated, full vent support at night - Scheduled BDs as ordered. - Arrange for home vent. - Mobilize as tol. - family to complete vent training - dc Cipro x 8ds  Acute on chronic diastolic CHF  Paroxsymal SVT Symptomatic bradycardia s/p PPM placement, currently paced HTN P:  - Hold diuresis currently - Continue clonidine, hydralazine, and coreg at  3.125 mg BID    AKI, exacerbated by diuresis - creatinine trending down Compensatory elevation in HCO3 Hypokalemia - resolved 10/22 Hypernatremia, corrected 10/25 and free water d/c P:   - Continue to hold lasix, auto diuresing. - Monitor BMET. - Correct electrolytes as indicated.   Morbid obesity P:   - SUP. - FEES today - if fails ,consult IR for PEG tube - Continue TF at goal.  Normocytic anemia- likely of chronic disease + losses from trach site P:  - Trend CBC - Continue iron supplement.  DM2 P:   - Lantus + SSI.  Dispo - Await placement  Oretha Milch, MD  [phone]

## 2013-03-23 NOTE — Progress Notes (Signed)
bbsh clear diminished, pt states her breathing is fine, pt declines to be suctioned at this time. Sats 99-100% on 40% ATC.

## 2013-03-24 DIAGNOSIS — Z930 Tracheostomy status: Secondary | ICD-10-CM

## 2013-03-24 LAB — BASIC METABOLIC PANEL
BUN: 37 mg/dL — ABNORMAL HIGH (ref 6–23)
Chloride: 95 mEq/L — ABNORMAL LOW (ref 96–112)
Creatinine, Ser: 1.37 mg/dL — ABNORMAL HIGH (ref 0.50–1.10)
GFR calc Af Amer: 49 mL/min — ABNORMAL LOW (ref >90)
GFR calc non Af Amer: 42 mL/min — ABNORMAL LOW (ref >90)
Glucose, Bld: 108 mg/dL — ABNORMAL HIGH (ref 70–99)

## 2013-03-24 LAB — GLUCOSE, CAPILLARY
Glucose-Capillary: 113 mg/dL — ABNORMAL HIGH (ref 70–99)
Glucose-Capillary: 143 mg/dL — ABNORMAL HIGH (ref 70–99)
Glucose-Capillary: 155 mg/dL — ABNORMAL HIGH (ref 70–99)

## 2013-03-24 LAB — CBC
HCT: 26.4 % — ABNORMAL LOW (ref 36.0–46.0)
Hemoglobin: 8.2 g/dL — ABNORMAL LOW (ref 12.0–15.0)
MCV: 90.7 fL (ref 78.0–100.0)
Platelets: 415 10*3/uL — ABNORMAL HIGH (ref 150–400)

## 2013-03-24 MED ORDER — CARVEDILOL 6.25 MG PO TABS
6.2500 mg | ORAL_TABLET | Freq: Two times a day (BID) | ORAL | Status: DC
  Filled 2013-03-24 (×2): qty 1

## 2013-03-24 MED ORDER — CARVEDILOL 6.25 MG PO TABS
6.2500 mg | ORAL_TABLET | Freq: Two times a day (BID) | ORAL | Status: DC
  Administered 2013-03-24: 6.25 mg
  Filled 2013-03-24 (×3): qty 1

## 2013-03-24 MED ORDER — DILTIAZEM HCL 30 MG PO TABS
30.0000 mg | ORAL_TABLET | Freq: Four times a day (QID) | ORAL | Status: DC
  Filled 2013-03-24 (×5): qty 1

## 2013-03-24 MED ORDER — OXYCODONE-ACETAMINOPHEN 5-325 MG PO TABS
1.0000 | ORAL_TABLET | ORAL | Status: DC | PRN
  Administered 2013-03-24 – 2013-03-27 (×8): 1 via ORAL
  Filled 2013-03-24 (×11): qty 1

## 2013-03-24 MED ORDER — FERROUS SULFATE 325 (65 FE) MG PO TABS
325.0000 mg | ORAL_TABLET | Freq: Two times a day (BID) | ORAL | Status: DC
  Administered 2013-03-24 – 2013-03-27 (×7): 325 mg via ORAL
  Filled 2013-03-24 (×8): qty 1

## 2013-03-24 MED ORDER — CARVEDILOL 6.25 MG PO TABS
6.2500 mg | ORAL_TABLET | Freq: Two times a day (BID) | ORAL | Status: DC
  Administered 2013-03-24 – 2013-03-25 (×2): 6.25 mg via ORAL
  Filled 2013-03-24 (×4): qty 1

## 2013-03-24 MED ORDER — FAMOTIDINE 20 MG PO TABS
20.0000 mg | ORAL_TABLET | Freq: Two times a day (BID) | ORAL | Status: DC
  Administered 2013-03-24 – 2013-03-27 (×6): 20 mg via ORAL
  Filled 2013-03-24 (×7): qty 1

## 2013-03-24 MED ORDER — SPIRONOLACTONE 25 MG PO TABS
25.0000 mg | ORAL_TABLET | Freq: Every day | ORAL | Status: DC
  Administered 2013-03-24 – 2013-03-27 (×4): 25 mg via ORAL
  Filled 2013-03-24 (×4): qty 1

## 2013-03-24 NOTE — Progress Notes (Signed)
Speech Language Pathology Treatment: [name] Speaking valve;Dysphagia  Patient Details Name: [name] MRN: [id number] DOB: April 04, 1956 Today's Date: 03/24/2013 Time: 0900-1000 SLP Time Calculation (min): 60 min  Assessment / Plan / Recommendation Clinical Impression  Pt reclining in bed. She reported not being hungry, but was willing to take several sips of nectar thick liquid.  No overt s/s aspiration observed or reported.  Pt willing to keep PMSV in place for more than 60 minutes.  At 70 minute mark, pt verbalized wanting to have PMSV removed.  RN was present, and indicated the need to provide some medication while PMSV in place, and then would remove valve. Pt aware of RN plan to remove valve following meds.  Pt voice quality was clear, low volume, intelligible.  VSS remained stable throughout trial.  Pt was encouraged by her increased tolerance of PMV placement today.  SLP continued to encourage pt to increase to wearing valve during all waking hours, except during breathing treatments, and explained benefits of pmv use.  ST to continue to follow for PMV tolerance and diet advancement.   HPI HPI: 57 y/o female admitted 10/07 to [facility] with hypercarbic failure due to OHS and CHF. Failed extubation 10/11 and difficult re-intubation (6.0 ETT). Transferred to [facility] ICU 10/14 for further eval and mgmt.  S/p tracheostomy 10/15 due to inability to wean from vent.  Now tolerating trach collar.  Pt has been participating in PMSV trials, and underwent BSE yesterday.     Pertinent Vitals VSS  SLP Plan  Continue with current plan of care    Recommendations Diet recommendations: Dysphagia 3 (mechanical soft);Nectar-thick liquid Liquids provided via: Straw Medication Administration: Whole meds with puree Supervision: Staff to assist with self feeding;Full supervision/cueing for compensatory strategies Compensations: Slow rate;Small sips/bites;Multiple dry swallows after each bite/sip Postural  Changes and/or Swallow Maneuvers: Seated upright 90 degrees;Upright 30-60 min after meal      Patient may use Passy-Muir Speech Valve: During all waking hours (remove during sleep);During PO intake/meals PMSV Supervision: Intermittent MD: Please consider changing trach tube to : Smaller size;Cuffless       Oral Care Recommendations: Oral care Q4 per protocol Follow up Recommendations: Skilled Nursing facility;LTACH Plan: Continue with current plan of care    GO   Celia B. Murvin [facility], CCC-SLP [phone]  [name] 03/24/2013, 12:23 PM

## 2013-03-24 NOTE — Progress Notes (Signed)
PULMONARY  / CRITICAL CARE MEDICINE  Name: [name] MRN: [id number] DOB: October 22, 1955    ADMISSION DATE:  02/24/2013 CONSULTATION DATE:  03/03/2013  REFERRING MD :  Kari Baars PRIMARY SERVICE: PCCM  CHIEF COMPLAINT:  VDRF due to OHS, pulm edema  BRIEF PATIENT DESCRIPTION:  28 yobf  admitted 10/07 to [facility] with hypercarbic failure due to OHS and CHF. Failed extubation 10/11 and difficult re-intubation (6.0 ETT). Transferred to [facility] ICU 10/14 for further eval and mgmt  SIGNIFICANT EVENTS / STUDIES:  10/7 Admitted for hypercarbic resp failure, OHS, CHF.  Intubated 10/9 transient SVT controlled with IV cardizem, transitioned to PO cardizem and coreg 10/11 pt extubated and re-intubated, steroids, difficult re-intubation used # 6 tube 10/14 transferred to [facility] ICU, likely needs trach 10/15 tracheostomy tube placed By ENT 11/4   Trach Collar with Shelbie Hutching valve  LINES / TUBES: ETT 10/07 >> 10/11, 10/11>>>10/15 Tracheostomy(Teoh) 10/15>>>changed to 6 cuffed xlt 10/27 Foley 10/8>>>  CULTURES: MRSA PCR 10/7 >> neg 10-17 bc>> neg 10-17 sputum>>gpc,gnr,gpr> mod strep not Group A 10-17 uc>>mul species 10/24 sputum>  e coli resistant to pcn, sensitive to cipro   ANTIBIOTICS: Cipro (e coli tracheitis) 10/26 >>>11/3  SUBJECTIVE:  Patient resting comfortably - tolerating TC well afebrile  VITAL SIGNS: Temp:  [97.5 F (36.4 C)-98.5 F (36.9 C)] 97.5 F (36.4 C) (11/04 0745) Pulse Rate:  [58-75] 63 (11/04 0854) Resp:  [14-21] 14 (11/04 0808) BP: (129-194)/(72-108) 194/108 mmHg (11/04 0854) SpO2:  [94 %-100 %] 100 % (11/04 0808) FiO2 (%):  [40 %] 40 % (11/04 0808) Weight:  [403 lb 7.1 oz (183 kg)] 403 lb 7.1 oz (183 kg) (11/04 0438) HEMODYNAMICS:   VENTILATOR SETTINGS: Vent Mode:  [-] PRVC FiO2 (%):  [40 %] 40 % Set Rate:  [12 bmp] 12 bmp Vt Set:  [500 mL] 500 mL PEEP:  [5 cmH20] 5 cmH20 Plateau Pressure:  [17 cmH20-19 cmH20] 17 cmH20  INTAKE /  OUTPUT: Intake/Output     11/03 0701 - 11/04 0700 11/04 0701 - 11/05 0700   P.O. 120    NG/GT 1500    Total Intake(mL/kg) 1620 (8.9)    Urine (mL/kg/hr) 1900 (0.4) 300 (0.6)   Total Output 1900 300   Net -280 -300         PHYSICAL EXAMINATION: Gen: morbidly obese, lying in bed, comfortable on  Trach collar with Passey muir valve HEENT: NCAT, EOMI, MMM, trach with minimal discharge, Trach unremarkable  CV: Paced rhythm S1 S2, no murmur Resp: CTA bilaterally, minimal secretions Abd: +BS, belly soft and non tender,poor appetite Ext: chronic venous stasis changes on BL LE, 1+ pitting edema BL Neuro: Alert,good spirits   no focal deficits  LABS:  CBC Recent Labs     03/22/13  0440  03/24/13  0445  WBC  6.9  6.3  HGB  8.6*  8.2*  HCT  27.4*  26.4*  PLT  411*  415*   Coag's No results found for this basename: APTT, INR,  in the last 72 hours BMET Recent Labs     03/22/13  0440  03/24/13  0445  NA  136  136  K  4.8  4.6  CL  96  95*  CO2  31  34*  BUN  37*  37*  CREATININE  1.35*  1.37*  GLUCOSE  154*  108*   Electrolytes Recent Labs     03/22/13  0440  03/24/13  0445  CALCIUM  9.1  8.8   Liver Enzymes Recent Labs     03/22/13  0440  AST  25  ALT  25  ALKPHOS  95  BILITOT  0.2*  ALBUMIN  2.5*   Cardiac Enzymes Recent Labs     03/22/13  0440  PROBNP  1138.0*   Glucose Recent Labs     03/23/13  1205  03/23/13  1757  03/23/13  2037  03/24/13  0028  03/24/13  0440  03/24/13  0748  GLUCAP  167*  160*  127*  129*  113*  108*   Imaging:  CXR 11/2:  IMPRESSION:  Slightly improved aeration in the left lung. Diffuse interstitial lung densities are suggestive for edema.      ASSESSMENT / PLAN:  Acute on chronic hypercarbic respiratory failure, s/p tracheostomy Obesity hypoventilation syndrome OSA (+ possible central SA) Difficult airway Trach 10/15 E coli tracheobronchitis  P:   - TC as tolerated, full vent support at night - Increase  time on with PM valve as tolerated - Scheduled BDs as ordered. - Arrange for home vent./ goal for D/C 11/7 - Mobilize as tol. - family to complete vent training  Acute on chronic diastolic CHF  Paroxsymal SVT Symptomatic bradycardia s/p PPM placement, currently paced HTN P:  - Hold diuresis currently, continues to auto-diurese - Continue clonidine, hydralazine, and coreg at 3.125 mg BID -cardizem hel    AKI, exacerbated by diuresis - creatinine 1.37( 11/4) Compensatory elevation in HCO3 Hypokalemia - resolved 10/22 Hypernatremia, corrected 10/25 and free water d/c P:   - Continue to hold lasix, auto diuresing. - Monitor BMET. - Correct electrolytes as indicated.   Morbid obesity P:   - SUP. - FEES 11/3 indicated Dysphasia 3/Nectar thick liquids - Tube Feeds d/c'd 11/3 - Poor appetite 11/3  Normocytic anemia- likely of chronic disease + losses from trach site P:  - Trend CBC - Continue iron supplement.  DM2 P:   - Lantus + SSI.  Dispo - Goal is to d/c home 11/7.  Bevelyn Ngo, Student-NP  [facility] for Canary Brim, NP-C  Care during the described time interval was provided by me and/or other providers on the critical care team.  I have reviewed this patient's available data, including medical history, events of note, physical examination and test results as part of my evaluation  Richad Ramsay V.

## 2013-03-24 NOTE — Progress Notes (Signed)
Met with family at this time for continued trach education per family request. Two daughters, husband and niece at bedside. Reviewed suctioning and trach care. Family seems to have a good understanding, seems eager to learn and demonstrated back very well. Will follow up as needed. Goal discharge date is 03/27/13

## 2013-03-25 LAB — GLUCOSE, CAPILLARY
Glucose-Capillary: 124 mg/dL — ABNORMAL HIGH (ref 70–99)
Glucose-Capillary: 117 mg/dL — ABNORMAL HIGH (ref 70–99)
Glucose-Capillary: 142 mg/dL — ABNORMAL HIGH (ref 70–99)

## 2013-03-25 LAB — BASIC METABOLIC PANEL
BUN: 33 mg/dL — ABNORMAL HIGH (ref 6–23)
CO2: 28 mEq/L (ref 19–32)
Chloride: 94 mEq/L — ABNORMAL LOW (ref 96–112)
GFR calc Af Amer: 46 mL/min — ABNORMAL LOW (ref >90)
GFR calc non Af Amer: 40 mL/min — ABNORMAL LOW (ref >90)
Potassium: 4.5 mEq/L (ref 3.5–5.1)
Sodium: 134 mEq/L — ABNORMAL LOW (ref 135–145)

## 2013-03-25 LAB — CBC
HCT: 27.1 % — ABNORMAL LOW (ref 36.0–46.0)
Hemoglobin: 8.6 g/dL — ABNORMAL LOW (ref 12.0–15.0)
MCH: 28.3 pg (ref 26.0–34.0)
MCHC: 31.7 g/dL (ref 30.0–36.0)
RBC: 3.04 MIL/uL — ABNORMAL LOW (ref 3.87–5.11)
WBC: 6 10*3/uL (ref 4.0–10.5)

## 2013-03-25 MED ORDER — CARVEDILOL 12.5 MG PO TABS
12.5000 mg | ORAL_TABLET | Freq: Two times a day (BID) | ORAL | Status: DC
  Administered 2013-03-25 – 2013-03-26 (×3): 12.5 mg via ORAL
  Filled 2013-03-25 (×4): qty 1

## 2013-03-25 NOTE — Progress Notes (Signed)
PULMONARY  / CRITICAL CARE MEDICINE  Name: NEALA MIGGINS MRN: [id number] DOB: 01-17-1956    ADMISSION DATE:  02/24/2013 CONSULTATION DATE:  03/03/2013  REFERRING MD :  Kari Baars PRIMARY SERVICE: PCCM  CHIEF COMPLAINT:  VDRF due to OHS, pulm edema  BRIEF PATIENT DESCRIPTION:  36 yobf  admitted 10/07 to [facility] with hypercarbic failure due to OHS and CHF. Failed extubation 10/11 and difficult re-intubation (6.0 ETT). Transferred to [facility] ICU 10/14 for further eval and mgmt  SIGNIFICANT EVENTS / STUDIES:  10/7 Admitted for hypercarbic resp failure, OHS, CHF.  Intubated 10/9 transient SVT controlled with IV cardizem, transitioned to PO cardizem and coreg 10/11 pt extubated and re-intubated, steroids, difficult re-intubation used # 6 tube 10/14 transferred to [facility] ICU, likely needs trach 10/15 tracheostomy tube placed By ENT 11/4   Trach Collar with Shelbie Hutching valve  LINES / TUBES: ETT 10/07 >> 10/11, 10/11>>>10/15 Tracheostomy(Teoh) 10/15>>>changed to 6 cuffed xlt 10/27 Foley 10/8>>>  CULTURES: MRSA PCR 10/7 >> neg 10-17 bc>> neg 10-17 sputum>>gpc,gnr,gpr> mod strep not Group A 10-17 uc>>mul species 10/24 sputum>  e coli resistant to pcn, sensitive to cipro   ANTIBIOTICS: Cipro (e coli tracheitis) 10/26 >>>11/3  SUBJECTIVE:  Patient awake,alert and comfortable,eating breakfast - tolerating TC well afebrile  VITAL SIGNS: Temp:  [98 F (36.7 C)-98.4 F (36.9 C)] 98 F (36.7 C) (11/05 0425) Pulse Rate:  [59-68] 68 (11/05 0753) Resp:  [12-20] 13 (11/05 0753) BP: (135-194)/(66-108) 147/90 mmHg (11/05 0753) SpO2:  [97 %-100 %] 100 % (11/05 0753) FiO2 (%):  [28 %-40 %] 28 % (11/05 0753) Weight:  [394 lb 10 oz (179 kg)] 394 lb 10 oz (179 kg) (11/05 0500) HEMODYNAMICS:   VENTILATOR SETTINGS: Vent Mode:  [-] PRVC FiO2 (%):  [28 %-40 %] 28 % Set Rate:  [12 bmp] 12 bmp Vt Set:  [500 mL] 500 mL PEEP:  [5 cmH20] 5 cmH20 Plateau Pressure:  [23 cmH20-27 cmH20] 26  cmH20  INTAKE / OUTPUT: Intake/Output     11/04 0701 - 11/05 0700 11/05 0701 - 11/06 0700   P.O. 480    NG/GT     Total Intake(mL/kg) 480 (2.7)    Urine (mL/kg/hr) 1850 (0.4) 400 (1.2)   Total Output 1850 400   Net -1370 -400        Stool Occurrence 1 x     PHYSICAL EXAMINATION: Gen: morbidly obese, lying in bed, comfortable on  Trach collar 28% HEENT: NCAT, EOMI, MMM, trach with minimal discharge, Trach unremarkable  CV: Paced rhythm S1 S2, no murmur Resp: CTA bilaterally, minimal secretions, diminished per bases. Abd: +BS, belly soft and non tender,poor appetite Ext: chronic venous stasis changes on BL LE, 1+ pitting edema BL Neuro: Alert,good spirits   no focal deficits  LABS:  CBC Recent Labs     03/24/13  0445  03/25/13  0552  WBC  6.3  6.0  HGB  8.2*  8.6*  HCT  26.4*  27.1*  PLT  415*  463*   Coag's No results found for this basename: APTT, INR,  in the last 72 hours BMET Recent Labs     03/24/13  0445  03/25/13  0552  NA  136  134*  K  4.6  4.5  CL  95*  94*  CO2  34*  28  BUN  37*  33*  CREATININE  1.37*  1.43*  GLUCOSE  108*  119*   Electrolytes Recent Labs     03/24/13  3244  03/25/13  0552  CALCIUM  8.8  9.0   Liver Enzymes No results found for this basename: AST, ALT, ALKPHOS, BILITOT, ALBUMIN,  in the last 72 hours Cardiac Enzymes No results found for this basename: TROPONINI, PROBNP,  in the last 72 hours Glucose Recent Labs     03/24/13  0748  03/24/13  1247  03/24/13  1652  03/24/13  2018  03/25/13  0045  03/25/13  0429  GLUCAP  108*  134*  143*  155*  124*  117*   Imaging:  CXR 11/2:  IMPRESSION:  Slightly improved aeration in the left lung. Diffuse interstitial lung densities are suggestive for edema.      ASSESSMENT / PLAN:  Acute on chronic hypercarbic respiratory failure, s/p tracheostomy Obesity hypoventilation syndrome OSA (+ possible central SA) Difficult airway Trach 10/15 E coli tracheobronchitis  P:    - TC as tolerated, full vent support at night - Increase time on with PM valve as tolerated - Scheduled BDs as ordered. - Arrange for home vent./ goal for D/C 11/7 - Mobilize as tol. - family to complete vent training  Acute on chronic diastolic CHF  Paroxsymal SVT Symptomatic bradycardia s/p PPM placement, currently paced HTN P:  - Hold diuresis currently, continues to auto-diurese, ( neg balance 1300 11/4) - Continue clonidine, hydralazine, and increase coreg to 12.5 -cardizem held    AKI, exacerbated by diuresis - creatinine 1.37( 11/4) Compensatory elevation in HCO3 Hypokalemia - resolved 10/22 Hypernatremia, corrected 10/25 and free water d/c Hyponatremia 11/5 P:   - Continue to hold lasix, auto diuresing. - Monitor BMET. - Correct electrolytes as indicated.   Morbid obesity P:   - SUP. - FEES 11/3 indicated Dysphasia 3/Nectar thick liquids - Tube Feeds d/c'd 11/3 - Appetite a little better 11/5  Normocytic anemia- likely of chronic disease + losses from trach site HGB Up-trending with auto-diuresing 11/5 P:  - Trend CBC - Continue iron supplement.  DM2 P:   - Lantus + SSI.  Dispo - Goal is to d/c home 11/7. Case Management assistance appreciated.  Bevelyn Ngo, Student-NP  [facility], NP-C  Care during the described time interval was provided by me and/or other providers on the critical care team.  I have reviewed this patient's available data, including medical history, events of note, physical examination and test results as part of my evaluation  Nathasha Fiorillo V.

## 2013-03-26 LAB — GLUCOSE, CAPILLARY
Glucose-Capillary: 115 mg/dL — ABNORMAL HIGH (ref 70–99)
Glucose-Capillary: 121 mg/dL — ABNORMAL HIGH (ref 70–99)
Glucose-Capillary: 126 mg/dL — ABNORMAL HIGH (ref 70–99)
Glucose-Capillary: 135 mg/dL — ABNORMAL HIGH (ref 70–99)

## 2013-03-26 MED ORDER — DILTIAZEM HCL 30 MG PO TABS
30.0000 mg | ORAL_TABLET | Freq: Four times a day (QID) | ORAL | Status: DC
  Administered 2013-03-26 – 2013-03-27 (×4): 30 mg via ORAL
  Filled 2013-03-26 (×7): qty 1

## 2013-03-26 MED ORDER — ALBUTEROL SULFATE (5 MG/ML) 0.5% IN NEBU
2.5000 mg | INHALATION_SOLUTION | Freq: Four times a day (QID) | RESPIRATORY_TRACT | Status: DC
  Administered 2013-03-26 – 2013-03-27 (×7): 2.5 mg via RESPIRATORY_TRACT
  Filled 2013-03-26 (×7): qty 0.5

## 2013-03-26 MED ORDER — CARVEDILOL 25 MG PO TABS
25.0000 mg | ORAL_TABLET | Freq: Two times a day (BID) | ORAL | Status: DC
  Administered 2013-03-27 (×2): 25 mg via ORAL
  Filled 2013-03-26 (×3): qty 1

## 2013-03-26 NOTE — Progress Notes (Signed)
Speech Language Pathology Treatment: [name] Speaking valve  Patient Details Name: [name] MRN: [id number] DOB: 04-07-1956 Today's Date: 03/26/2013 Time: 1340-1405 SLP Time Calculation (min): 25 min  Assessment / Plan / Recommendation Clinical Impression  Pt alert, responsive.  Family present today.  Pt continues to report pressure and discomfort with PMSV placement, and allowed placement for <5 minutes today.  Explained rationale for placement, benefits provided, and importance of increasing wearing time.  Family was educated regarding placement and removal of PMV, the critical significance of cuff deflation prior to placement of valve, and cleaning process.  Also reviewed times to remove valve (during breathing treatments, when sleeping, and when on vent). Family was encouraged to take information sheets posted at [facility] home with them upon DC, as well as to take PMSV envelope/material.  SLP provided opportunity to ask questions.  Per pt, DC is planned for tomorrow morning. ST will continue to follow up this weekend if not DC'd.     HPI HPI: 57 y/o female admitted 10/07 to [facility] with hypercarbic failure due to OHS and CHF. Failed extubation 10/11 and difficult re-intubation (6.0 ETT). Transferred to [facility] ICU 10/14 for further eval and mgmt.  S/p tracheostomy 10/15 due to inability to wean from vent.  Now tolerating trach collar.  Pt has been participating in PMSV trials, and underwent BSE. ST following for diet tolerance and PMSV treatments/education.   Pertinent Vitals VSS  SLP Plan  Continue with current plan of care    Recommendations  Increase placement of PMSV on a daily basis.      Patient may use Passy-Muir Speech Valve: During all waking hours (remove during sleep);During PO intake/meals PMSV Supervision: Intermittent MD: Please consider changing trach tube to : Smaller size;Cuffless       Oral Care Recommendations: Oral care Q4 per protocol Follow up Recommendations: 24 hour  supervision/assistance Plan: Continue with current plan of care    GO   [name] B. [facility], CCC-SLP [phone]  [name] 03/26/2013, 2:09 PM

## 2013-03-26 NOTE — Progress Notes (Signed)
NUTRITION FOLLOW UP  Intervention:    No nutrition intervention warranted at this time  RD to continue to follow  Nutrition Dx:   Inadequate oral intake, resolved  New Goal:   Pt to meet >/= 90% of their estimated nutrition needs, met  Monitor:   PO & supplemental intake, weight, labs, I/O's  Assessment:   Patient admitted 10/7 to [facility] with hypercarbic failure due to OHS and CHF. Failed extubation 10/11 and difficult re-intubation. Transferred from [facility] to [facility] on 10/14. S/P tracheostomy on 10/15.  Patient is currently on ventilator support -- trach  MV: 5.6 Temp: 36.7  Patient s/p FEES 11/3 -- SLP recommending Dys 3--nectar thick liquid diet. Glucerna 1.2 formula via NGT discontinued.  PO intake 100% per flowsheet records.  Trach care education to family continues.  Goal discharge date 11/7.  Height: Ht Readings from Last 1 Encounters:  03/03/13 5' (1.524 m)    Weight Status:   Wt Readings from Last 1 Encounters:  03/26/13 384 lb 4.2 oz (174.3 kg)    Re-estimated needs:  Kcal: 1700-1900 Protein: 90-100 gm Fluid: 1.7-1.9 L  Skin: neck incision   Diet Order: Dysphagia 3, nectar-thick liquids   Intake/Output Summary (Last 24 hours) at 03/26/13 1232 Last data filed at 03/26/13 1201  Gross per 24 hour  Intake    480 ml  Output   2250 ml  Net  -1770 ml    Labs:   Recent Labs Lab 03/22/13 0440 03/24/13 0445 03/25/13 0552  NA 136 136 134*  K 4.8 4.6 4.5  CL 96 95* 94*  CO2 31 34* 28  BUN 37* 37* 33*  CREATININE 1.35* 1.37* 1.43*  CALCIUM 9.1 8.8 9.0  GLUCOSE 154* 108* 119*    CBG (last 3)   Recent Labs  03/26/13 0428 03/26/13 0804 03/26/13 1204  GLUCAP 115* 121* 135*    Scheduled Meds: . albuterol  2.5 mg Nebulization QID  . antiseptic oral rinse  15 mL Mouth Rinse q12n4p  . carvedilol  12.5 mg Oral BID WC  . chlorhexidine  15 mL Mouth Rinse BID  . cloNIDine  0.3 mg Oral TID  . famotidine  20 mg Oral BID  . ferrous sulfate  325 mg Oral  BID WC  . heparin subcutaneous  5,000 Units Subcutaneous Q8H  . hydrALAZINE  100 mg Oral Q6H  . insulin aspart  0-20 Units Subcutaneous Q4H  . insulin aspart  3 Units Subcutaneous Q4H  . insulin glargine  40 Units Subcutaneous Daily  . nystatin   Topical BID  . spironolactone  25 mg Oral Daily    Continuous Infusions: . sodium chloride 10 mL/hr at 03/17/13 0700    Maureen Chatters, RD, LDN Pager #: [phone] After-Hours Pager #: [phone]

## 2013-03-26 NOTE — Progress Notes (Addendum)
Pt's O2 sat dropped to 88% at rest  while on RA and pt reports SOB.

## 2013-03-26 NOTE — Progress Notes (Signed)
PULMONARY  / CRITICAL CARE MEDICINE  Name: [name] MRN: [id number] DOB: 1955-07-29    ADMISSION DATE:  02/24/2013 CONSULTATION DATE:  03/03/2013  REFERRING MD :  Kari Baars PRIMARY SERVICE: PCCM  CHIEF COMPLAINT:  VDRF due to OHS, pulm edema  BRIEF PATIENT DESCRIPTION:  57 yobf  admitted 10/07 to [facility] with hypercarbic failure due to OHS and CHF. Failed extubation 10/11 and difficult re-intubation (6.0 ETT). Transferred to [facility] ICU 10/14 for further eval and mgmt  SIGNIFICANT EVENTS / STUDIES:  10/7 Admitted for hypercarbic resp failure, OHS, CHF.  Intubated 10/9 transient SVT controlled with IV cardizem, transitioned to PO cardizem and coreg 10/11 pt extubated and re-intubated, steroids, difficult re-intubation used # 6 tube 10/14 transferred to [facility] ICU, likely needs trach 10/15 tracheostomy tube placed By ENT 11/4   Trach Collar with Shelbie Hutching valve  LINES / TUBES: ETT 10/07 >> 10/11, 10/11>>>10/15 Tracheostomy(Teoh) 10/15>>>changed to 6 cuffed xlt 10/27 Foley 10/8>>>  CULTURES: MRSA PCR 10/7 >> neg 10-17 bc>> neg 10-17 sputum>>gpc,gnr,gpr> mod strep not Group A 10-17 uc>>mul species 10/24 sputum>  e coli resistant to pcn, sensitive to cipro   ANTIBIOTICS: Cipro (e coli tracheitis) 10/26 >>>11/3  SUBJECTIVE:  Patient awake,alert and comfortable,eating breakfast - tolerating TC well afebrile  VITAL SIGNS: Temp:  [97.7 F (36.5 C)-98.4 F (36.9 C)] 98.1 F (36.7 C) (11/06 0731) Pulse Rate:  [58-64] 60 (11/06 0817) Resp:  [12-18] 16 (11/06 0731) BP: (149-173)/(78-96) 169/86 mmHg (11/06 0817) SpO2:  [98 %-100 %] 100 % (11/06 0817) FiO2 (%):  [2 %-60 %] 28 % (11/06 0731) Weight:  [384 lb 4.2 oz (174.3 kg)] 384 lb 4.2 oz (174.3 kg) (11/06 0500) HEMODYNAMICS:   VENTILATOR SETTINGS: Vent Mode:  [-] Volume support FiO2 (%):  [2 %-60 %] 28 % Set Rate:  [12 bmp] 12 bmp Vt Set:  [500 mL] 500 mL PEEP:  [5 cmH20] 5 cmH20  INTAKE / OUTPUT: Intake/Output     11/05 0701 - 11/06 0700 11/06 0701 - 11/07 0700   P.O. 950    Total Intake(mL/kg) 950 (5.5)    Urine (mL/kg/hr) 2200 (0.5) 400 (1.2)   Total Output 2200 400   Net -1250 -400         PHYSICAL EXAMINATION: Gen: morbidly obese, lying in bed, comfortable on  Trach collar 28% HEENT: NCAT, EOMI, MMM, trach with minimal discharge, Trach unremarkable  CV: Paced rhythm S1 S2, no murmur Resp: CTA bilaterally,regular and unlabored, minimal secretions, diminished per bases. Abd: +BS, belly soft and non tender,improved appetite Ext: chronic venous stasis changes on BL LE, 1+ pitting edema BL Neuro: Alert,good spirits   no focal deficits, looking forward to going home 11/6.  LABS:  CBC Recent Labs     03/24/13  0445  03/25/13  0552  WBC  6.3  6.0  HGB  8.2*  8.6*  HCT  26.4*  27.1*  PLT  415*  463*   Coag's No results found for this basename: APTT, INR,  in the last 72 hours BMET Recent Labs     03/24/13  0445  03/25/13  0552  NA  136  134*  K  4.6  4.5  CL  95*  94*  CO2  34*  28  BUN  37*  33*  CREATININE  1.37*  1.43*  GLUCOSE  108*  119*   Electrolytes Recent Labs     03/24/13  0445  03/25/13  0552  CALCIUM  8.8  9.0  Liver Enzymes No results found for this basename: AST, ALT, ALKPHOS, BILITOT, ALBUMIN,  in the last 72 hours Cardiac Enzymes No results found for this basename: TROPONINI, PROBNP,  in the last 72 hours Glucose Recent Labs     03/25/13  1150  03/25/13  1656  03/25/13  2028  03/26/13  0019  03/26/13  0428  03/26/13  0804  GLUCAP  148*  138*  142*  126*  115*  121*   Imaging:  CXR 11/2:  IMPRESSION:  Slightly improved aeration in the left lung. Diffuse interstitial lung densities are suggestive for edema.   ASSESSMENT / PLAN:  Acute on chronic hypercarbic respiratory failure, s/p tracheostomy Obesity hypoventilation syndrome OSA (+ possible central SA) Difficult airway Trach 10/15 E coli tracheobronchitis  P:   - TC as tolerated,  full vent support at night - Increase time on with PM valve as tolerated - Scheduled BDs as ordered. - Arrange for home vent./ on track for goal for D/C 11/7 - Mobilize as tol. - family did well with overnight vent. Care 11/5-11/6. -Case management arranging for equipment for home.  Acute on chronic diastolic CHF  Paroxsymal SVT Symptomatic bradycardia s/p PPM placement, currently paced HTN P:  - Hold diuresis currently, continues to auto-diurese, ( neg balance 1250 11/5) - Continue clonidine, hydralazine, and increase coreg to 25 bid -cardizem resumed    AKI, exacerbated by diuresis - creatinine 1.37( 11/4) Compensatory elevation in HCO3 Hypokalemia - resolved 10/22 Hypernatremia, corrected 10/25 and free water d/c Hyponatremia 11/5 P:   - Continue to hold lasix, auto diuresing. - Monitor BMET. - Correct electrolytes as indicated.   Morbid obesity P:   - SUP. - FEES 11/3 indicated Dysphasia 3/Nectar thick liquids - Tube Feeds d/c'd 11/3 - Appetite significantly better 11/6  Normocytic anemia- likely of chronic disease + losses from trach site HGB Up-trending with auto-diuresing 11/5 P:  - Trend CBC - Continue iron supplement.  DM2 P:   - Lantus + SSI.  Dispo - Goal is to d/c home 11/7.- high BP remains an issue - increase coreg to home dose & cardizem Case Management assistance appreciated.  Bevelyn Ngo, Student-NP  [facility], NP-C  Care during the described time interval was provided by me and/or other providers on the critical care team.  I have reviewed this patient's available data, including medical history, events of note, physical examination and test results as part of my evaluation   Jaceyon Strole V.

## 2013-03-26 NOTE — Progress Notes (Signed)
Physical Therapy Evaluation Patient Details Name: [name] MRN: [id number] DOB: 1956-03-02 Today's Date: 03/26/2013 Time: 1151-1207 PT Time Calculation (min): 16 min  PT Assessment / Plan / Recommendation History of Present Illness  Pt admit with CHF.    Clinical Impression  Pt admitted with above. Pt currently with functional limitations due to the deficits listed below (see PT Problem List). Pt has home environment set up prn.  Will provide pt with theraband to maintain strength and ensure pt is going to continue exercises with family.   Pt will benefit from skilled PT to increase their independence and safety with mobility to allow discharge to the venue listed below.     PT Assessment  Patient needs continued PT services    Follow Up Recommendations  Supervision/Assistance - 24 hour                Equipment Recommendations  None recommended by PT         Frequency Min 1X/week    Precautions / Restrictions Precautions Precautions: Fall Restrictions Weight Bearing Restrictions: No   Pertinent Vitals/Pain VSS, some pain with LE movement      Mobility  Bed Mobility Bed Mobility: Not assessed Transfers Transfers: Not assessed Ambulation/Gait Ambulation/Gait Assistance: Not tested (comment) Stairs: No Wheelchair Mobility Wheelchair Mobility: No    Exercises General Exercises - Upper Extremity Shoulder Flexion: AROM;Both;10 reps;Supine Shoulder ABduction: AROM;Both;10 reps;Supine General Exercises - Lower Extremity Ankle Circles/Pumps: AROM;Both;10 reps;Supine Heel Slides: Both;10 reps;AAROM;Supine Hip ABduction/ADduction: AAROM;Both;10 reps;Supine Straight Leg Raises: AAROM;Both;10 reps;Supine   PT Diagnosis: Generalized weakness  PT Problem List: Decreased activity tolerance;Decreased strength;Decreased balance;Decreased mobility;Decreased knowledge of use of DME;Decreased safety awareness;Decreased knowledge of precautions PT Treatment Interventions:  Therapeutic exercise;Patient/family education     PT Goals(Current goals can be found in the care plan section) Acute Rehab PT Goals Patient Stated Goal: to go home tomorrow PT Goal Formulation: With patient/family Time For Goal Achievement: 04/02/13 Potential to Achieve Goals: Fair  Visit Information  Last PT Received On: 03/26/13 Assistance Needed: +1 (Bed level) History of Present Illness: Pt admit with CHF.         Prior Functioning  Home Living Family/patient expects to be discharged to:: Private residence Living Arrangements: Spouse/significant other;Children Available Help at Discharge: Available 24 hours/day;Family Type of Home: House Home Access: Ramped entrance Home Layout: One level Home Equipment: Wheelchair - manual;Hospital bed (hoyer lift) Additional Comments: Children care for pt at home and get her to wheelchair 3xwk Prior Function Level of Independence: Needs assistance Gait / Transfers Assistance Needed: Hasnt walked in years.  Hoyer lift to wheelchair ADL's / [employer] Assistance Needed: total assist Communication / Swallowing Assistance Needed: trach with PMV Communication Communication: Tracheostomy;Passy-Muir valve    Cognition  Cognition Arousal/Alertness: Awake/alert Behavior During Therapy: WFL for tasks assessed/performed Overall Cognitive Status: Within Functional Limits for tasks assessed    Extremity/Trunk Assessment Upper Extremity Assessment Upper Extremity Assessment: Defer to OT evaluation Lower Extremity Assessment Lower Extremity Assessment: RLE deficits/detail;LLE deficits/detail RLE Deficits / Details: grossly 2/5 LLE Deficits / Details: grossly 2-/5   Balance    End of Session PT - End of Session Equipment Utilized During Treatment: Oxygen Activity Tolerance: Patient limited by fatigue Patient left: in bed;with call bell/phone within reach;with family/visitor present Nurse Communication: Mobility status;Need for lift  equipment       INGOLD,Jalaiyah Throgmorton 03/26/2013, 1:04 PM  [facility] Acute Rehabilitation [phone] (pager)

## 2013-03-27 LAB — GLUCOSE, CAPILLARY: Glucose-Capillary: 104 mg/dL — ABNORMAL HIGH (ref 70–99)

## 2013-03-27 MED ORDER — ALPRAZOLAM 0.5 MG PO TABS: 0.5000 mg | ORAL_TABLET | Freq: Two times a day (BID) | ORAL | Status: AC | PRN

## 2013-03-27 MED ORDER — HYDRALAZINE HCL 100 MG PO TABS: 100.0000 mg | ORAL_TABLET | Freq: Four times a day (QID) | ORAL | Status: AC

## 2013-03-27 MED ORDER — INSULIN ASPART 100 UNIT/ML ~~LOC~~ SOLN: 0.0000 [IU] | SUBCUTANEOUS | Status: AC

## 2013-03-27 MED ORDER — RESOURCE THICKENUP CLEAR PO POWD: ORAL | Status: DC

## 2013-03-27 MED ORDER — CARVEDILOL 25 MG PO TABS
25.0000 mg | ORAL_TABLET | Freq: Two times a day (BID) | ORAL | Status: AC
Start: 2013-03-27 — End: ?

## 2013-03-27 MED ORDER — OXYCODONE-ACETAMINOPHEN 5-325 MG PO TABS: 1.0000 | ORAL_TABLET | Freq: Four times a day (QID) | ORAL | Status: AC | PRN

## 2013-03-27 MED ORDER — INSULIN ASPART 100 UNIT/ML ~~LOC~~ SOLN: 3.0000 [IU] | SUBCUTANEOUS | Status: DC

## 2013-03-27 MED ORDER — INSULIN GLARGINE 100 UNIT/ML ~~LOC~~ SOLN: 40.0000 [IU] | Freq: Every day | SUBCUTANEOUS | Status: AC

## 2013-03-27 MED ORDER — ALBUTEROL SULFATE (5 MG/ML) 0.5% IN NEBU: 2.5000 mg | INHALATION_SOLUTION | Freq: Four times a day (QID) | RESPIRATORY_TRACT | Status: DC

## 2013-03-27 MED ORDER — CLONIDINE HCL 0.3 MG PO TABS: 0.3000 mg | ORAL_TABLET | Freq: Three times a day (TID) | ORAL | Status: AC

## 2013-03-27 MED ORDER — DILTIAZEM HCL 30 MG PO TABS: 30.0000 mg | ORAL_TABLET | Freq: Four times a day (QID) | ORAL | Status: AC

## 2013-03-27 MED ORDER — ACETAMINOPHEN 325 MG PO TABS: 650.0000 mg | ORAL_TABLET | Freq: Four times a day (QID) | ORAL | Status: AC | PRN

## 2013-03-27 MED ORDER — FAMOTIDINE 20 MG PO TABS: 20.0000 mg | ORAL_TABLET | Freq: Two times a day (BID) | ORAL | Status: AC

## 2013-03-27 NOTE — Discharge Summary (Signed)
Physician Discharge Summary  Patient ID: [name] MRN: [id number] DOB/AGE: Jun 03, 1955 57 y.o.  Admit date: 02/24/2013 Discharge date: 03/27/2013    Discharge Diagnoses:  Acute on Chronic Hypercarbic Respiratory Failure Obesity Hypoventilation Syndrome OSA (+possible central sleep apnea) Difficult Airway status post Tracheostomy E-Coli Tracheobronchitis Acute on Chronic Diastolic CHF Paroxysmal SVT Symptomatic Bradycardia s/p PPM placement HTN AKI Hypokalemia Hypernatremia Hyponatremia Morbid Obesity Protein Calorie Malnutrition Normocytic Anemia Diabetes Mellitus                                                                              DISCHARGE PLAN BY DIAGNOSIS     Acute on Chronic Hypercarbic Respiratory Failure Obesity Hypoventilation Syndrome OSA (+possible central sleep apnea) Difficult Airway status post Tracheostomy E-Coli Tracheobronchitis  Discharge Plan:  - TC as tolerated during day, full vent support at night  - Increase time on with PM valve as tolerated  - Scheduled BDs - Home vent per [facility] - Trach care QD with inner cannula change & PRN - Home RN & SLP - Follow up with Dr. Suszanne [name] in [place] for trach change  Acute on Chronic Diastolic CHF  Paroxsymal SVT  Symptomatic Bradycardia s/p PPM placement HTN   Discharge Plan: - Continue clonidine, hydralazine, coreg, cardizem  - follow up blood pressure review with Dr. Juanetta Gosling  AKI Hypokalemia Hypernatremia Hyponatremia   Discharge Plan: - Follow up BMET 11/12 with results to be sent to Dr. Juanetta Gosling  Morbid Obesity  Protein Calorie Malnutrition  Discharge Plan: - continue pepcid - Dysphasia 3 / Nectar thick liquids  - SLP at home  Normocytic anemia  Discharge Plan: - CBC 11/12 with results to be sent to Dr. Juanetta Gosling - Continue iron supplement.    DM2   Discharge Plan: - Lantus + SSI.                 DISCHARGE SUMMARY   [name] is a 57 y.o.  y/o female with a PMH of HTN, Chronic diastolic HF, COPD, SVT, DM, OSA/OHS, chronic recurrent LE cellulitis, presented to the [facility] ED with palpitations on 02/24/2013.  3 days earlier, she had presented to the ED for the same reasons and was treated for SVT with Adenosine & Cardizem with successful conversion to NSR.  She returned on 10/7 with acute hypercarbic respiratory failure, CHF exacerbation and required intubation.  Patient was admitted to ICU, pan cultured which included a sputum culture positive for strep, UC positive for multiple species and later a sputum culture (10/24) positive for e-coli that was resistant to PCN / sensitive to Cipro.  She was treated for E-Coli tracheobronchitis with IV Cipro.  Early in her hospitalization, she had noted SVT which was controlled with IV cardizem.  10/11 she was extubated and failed requiring re-intubation.  She was a very difficult intubation - used a #6 ETT.  Patient remained in ICU and ultimately required placement of tracheostomy by ENT. Post tracheostomy placement, she weaned from mechanical ventilation and was able to tolerate ATC during day with nocturnal ventilation.  She passed a swallow evaluation per SLP for Dysphagia 3, nectar thick liquids.  Also, has made progress with PMV usage.  Family was  trained on home ventilator use prior to discharge and have demonstrated competence.     SIGNIFICANT EVENTS / STUDIES:  10/7 Admitted for hypercarbic resp failure, OHS, CHF. Intubated  10/9 transient SVT controlled with IV cardizem, transitioned to PO cardizem and coreg  10/11 pt extubated and re-intubated, steroids, difficult re-intubation used # 6 tube  10/14 transferred to [facility] ICU, likely needs trach  10/15 tracheostomy tube placed By ENT  11/4 Trach Collar with [name] valve   LINES / TUBES:  ETT 10/07 >> 10/11, 10/11>>>10/15  Tracheostomy [name]) 10/15>>>changed to 6 cuffed xlt 10/27  Foley 10/8>>>   CULTURES:  10/7 MRSA PCR>>> neg  10/17  BCx2>>>neg  10/17 sputum>>>gpc,gnr,gpr> mod strep not Group A  10/17 UC>>mul species  10/24 sputum>>>e coli resistant to pcn, sensitive to cipro   ANTIBIOTICS:  Cipro (e coli tracheitis) 10/26 >>>11/3  Discharge Exam: Gen: morbidly obese, lying in bed, comfortable on Trach collar 28%  HEENT: NCAT, EOMI, MMM, trach with minimal discharge, Trach unremarkable  CV: Paced rhythm S1 S2, no murmur  Resp: CTA bilaterally,regular and unlabored, minimal secretions, diminished per bases.  Abd: +BS, belly soft and non tender,improved appetite  Ext: chronic venous stasis changes on BL LE, 1+ pitting edema BL  Neuro: Alert,good spirits no focal deficits   Filed Vitals:   03/27/13 0812 03/27/13 0827 03/27/13 1137 03/27/13 1141  BP:  180/94 162/81   Pulse: 59 60 59   Temp:  97.8 F (36.6 C)    TempSrc:  Oral    Resp: 16 16 15    Height:      Weight:      SpO2: 100% 99% 99% 99%    Discharge Labs  BMET  Recent Labs Lab 03/21/13 0544 03/22/13 0440 03/24/13 0445 03/25/13 0552  NA 137 136 136 134*  K 5.1 4.8 4.6 4.5  CL 97 96 95* 94*  CO2 30 31 34* 28  GLUCOSE 166* 154* 108* 119*  BUN 41* 37* 37* 33*  CREATININE 1.29* 1.35* 1.37* 1.43*  CALCIUM 9.1 9.1 8.8 9.0   CBC  Recent Labs Lab 03/22/13 0440 03/24/13 0445 03/25/13 0552  HGB 8.6* 8.2* 8.6*  HCT 27.4* 26.4* 27.1*  WBC 6.9 6.3 6.0  PLT 411* 415* 463*      Medication List    STOP taking these medications       albuterol (2.5 MG/3ML) 0.083% nebulizer solution  Commonly known as:  PROVENTIL  Replaced by:  albuterol (5 MG/ML) 0.5% nebulizer solution     allopurinol 300 MG tablet  Commonly known as:  ZYLOPRIM     ALPRAZolam 0.5 MG tablet  Commonly known as:  XANAX     insulin lispro protamine-lispro (75-25) 100 UNIT/ML Susp injection  Commonly known as:  HUMALOG 75/25     torsemide 100 MG tablet  Commonly known as:  DEMADEX      TAKE these medications       acetaminophen 325 MG tablet  Commonly known as:   TYLENOL  Take 2 tablets (650 mg total) by mouth every 6 (six) hours as needed for mild pain (or Fever >/= 101).     albuterol (5 MG/ML) 0.5% nebulizer solution  Commonly known as:  PROVENTIL  Take 0.5 mLs (2.5 mg total) by nebulization 4 (four) times daily. And Q3 PRN shortness of breath / wheezing     carvedilol 25 MG tablet  Commonly known as:  COREG  Take 1 tablet (25 mg total) by mouth 2 (two) times daily.  cloNIDine 0.3 MG tablet  Commonly known as:  CATAPRES  Take 1 tablet (0.3 mg total) by mouth 3 (three) times daily.     diltiazem 30 MG tablet  Commonly known as:  CARDIZEM  Take 1 tablet (30 mg total) by mouth every 6 (six) hours.     famotidine 20 MG tablet  Commonly known as:  PEPCID  Take 1 tablet (20 mg total) by mouth 2 (two) times daily.     ferrous sulfate 325 (65 FE) MG tablet  Take 325 mg by mouth daily.     hydrALAZINE 100 MG tablet  Commonly known as:  APRESOLINE  Take 1 tablet (100 mg total) by mouth 4 (four) times daily.     insulin aspart 100 UNIT/ML injection  Commonly known as:  novoLOG  Inject 3 Units into the skin every 4 (four) hours.     insulin aspart 100 UNIT/ML injection  Commonly known as:  novoLOG  - Inject 0-20 Units into the skin every 4 (four) hours. CBG < 70: give sugar packet under tongue or orange juice if awake and can swallow   - CBG > 400 call MD     -     - CBG 70 - 120: 0 units     - CBG 121 - 150: 3 units     - CBG 151 - 200: 4 units     - CBG 201 - 250: 7 units     - CBG 251 - 300: 11 units     - CBG 301 - 350: 15 units     - CBG 351 - 400: 20 units     insulin glargine 100 UNIT/ML injection  Commonly known as:  LANTUS  Inject 0.4 mLs (40 Units total) into the skin daily.     multivitamins ther. w/minerals Tabs tablet  Take 1 tablet by mouth daily.     neomycin-bacitracin-polymyxin [phone] ointment  APPLY TO YOUR LEGS ON EVERY Tuesday, Thursday, AND Saturday.     nystatin cream  Commonly known as:   MYCOSTATIN  APPLY TO YOUR SKIN FOLDS AND LEGS EVERY Monday, Wednesday , AND Friday.     RESOURCE THICKENUP CLEAR Powd  Add to liquids to thicken for nectar thick consistency.     spironolactone 25 MG tablet  Commonly known as:  ALDACTONE  Take 25 mg by mouth daily.     vitamin C 500 MG tablet  Commonly known as:  ASCORBIC ACID  Take 500 mg by mouth daily.        Follow-up Information   Follow up with HAWKINS,EDWARD L, MD On 03/27/2013. (Office should call you for an appointment.  Can be seen the week of the 17th.  )    Specialty:  Pulmonary Disease   Contact information:   [address] [place] [address] [phone]       Follow up with DR Janeece Riggers Philomena Doheny On 04/30/2013. (Appt at 1:50 for Trach Change)    Contact information:   [address] [place] [address]      Disposition: Home with RN & SLP.  ATC during day time / waking hours.  Mechanical ventilation at night.    Discharged Condition: [name] has met maximum benefit of inpatient care and is medically stable and cleared for discharge.  Patient is pending follow up as above.     Time spent on disposition:  Greater than 35 minutes.   Signed: Canary Brim,  NP-C [facility] Pgr: [phone] Office: [phone]  Lonia Farber, MD Pulmonary and Critical Care Medicine [facility] Pager: [phone]

## 2013-04-08 ENCOUNTER — Emergency Department (HOSPITAL_COMMUNITY): Payer: Medicaid Other

## 2013-04-08 ENCOUNTER — Encounter (HOSPITAL_COMMUNITY): Payer: Self-pay | Admitting: Emergency Medicine

## 2013-04-08 ENCOUNTER — Emergency Department (HOSPITAL_COMMUNITY)
Admission: EM | Admit: 2013-04-08 | Discharge: 2013-04-08 | Disposition: A | Discharge status: Home / Self Care | Payer: Medicaid Other | Attending: Emergency Medicine | Admitting: Emergency Medicine

## 2013-04-08 DIAGNOSIS — J209 Acute bronchitis, unspecified: Secondary | ICD-10-CM | POA: Insufficient documentation

## 2013-04-08 DIAGNOSIS — J40 Bronchitis, not specified as acute or chronic: Secondary | ICD-10-CM

## 2013-04-08 DIAGNOSIS — Z9981 Dependence on supplemental oxygen: Secondary | ICD-10-CM | POA: Insufficient documentation

## 2013-04-08 DIAGNOSIS — M129 Arthropathy, unspecified: Secondary | ICD-10-CM | POA: Insufficient documentation

## 2013-04-08 DIAGNOSIS — E662 Morbid (severe) obesity with alveolar hypoventilation: Secondary | ICD-10-CM | POA: Insufficient documentation

## 2013-04-08 DIAGNOSIS — J4489 Other specified chronic obstructive pulmonary disease: Secondary | ICD-10-CM | POA: Insufficient documentation

## 2013-04-08 DIAGNOSIS — Z794 Long term (current) use of insulin: Secondary | ICD-10-CM | POA: Insufficient documentation

## 2013-04-08 DIAGNOSIS — I10 Essential (primary) hypertension: Secondary | ICD-10-CM | POA: Insufficient documentation

## 2013-04-08 DIAGNOSIS — Z872 Personal history of diseases of the skin and subcutaneous tissue: Secondary | ICD-10-CM | POA: Insufficient documentation

## 2013-04-08 DIAGNOSIS — Z95818 Presence of other cardiac implants and grafts: Secondary | ICD-10-CM | POA: Insufficient documentation

## 2013-04-08 DIAGNOSIS — I252 Old myocardial infarction: Secondary | ICD-10-CM | POA: Insufficient documentation

## 2013-04-08 DIAGNOSIS — E119 Type 2 diabetes mellitus without complications: Secondary | ICD-10-CM | POA: Insufficient documentation

## 2013-04-08 DIAGNOSIS — J449 Chronic obstructive pulmonary disease, unspecified: Secondary | ICD-10-CM | POA: Insufficient documentation

## 2013-04-08 DIAGNOSIS — Z8719 Personal history of other diseases of the digestive system: Secondary | ICD-10-CM | POA: Insufficient documentation

## 2013-04-08 DIAGNOSIS — I5032 Chronic diastolic (congestive) heart failure: Secondary | ICD-10-CM | POA: Insufficient documentation

## 2013-04-08 DIAGNOSIS — Z79899 Other long term (current) drug therapy: Secondary | ICD-10-CM | POA: Insufficient documentation

## 2013-04-08 DIAGNOSIS — Z87891 Personal history of nicotine dependence: Secondary | ICD-10-CM | POA: Insufficient documentation

## 2013-04-08 MED ORDER — LEVOFLOXACIN 750 MG PO TABS: 750.0000 mg | ORAL_TABLET | Freq: Every day | ORAL | Status: AC

## 2013-04-08 NOTE — ED Notes (Signed)
Pt alert & oriented x4, stable gait. Patient given discharge instructions, paperwork & prescription(s). Patient instructed to stop at the registration desk to finish any additional paperwork. Patient verbalized understanding. Pt left department w/ no further questions. EMS called for transport

## 2013-04-08 NOTE — ED Notes (Signed)
SOB for a couple of days. Cough and congestion

## 2013-04-08 NOTE — Progress Notes (Signed)
RT called to check on pt.  Pt with a #6 XL shiley on a NRB mask on trach.  Pt asking to be suctioned.  Pt placed on 28% ATC and tolerating well.  Pt suction X2 with small to moderate thick secretions.  Pt stating that she feels better now.  O2 sat 100%.  RT will continue to monitor.

## 2013-04-08 NOTE — ED Provider Notes (Deleted)
CSN: [id number]     Arrival date & time 04/08/13  1349 History   First MD Initiated Contact with Patient 04/08/13 1418     No chief complaint on file.  (Consider location/radiation/quality/duration/timing/severity/associated sxs/prior Treatment) HPI  Past Medical History  Diagnosis Date  . Morbid obesity   . Essential hypertension, benign   . Chronic diastolic heart failure     LVEF 60-65% 2011  . COPD (chronic obstructive pulmonary disease)     Oxygen dependent  . History of cardiac catheterization     Normal coronary arteries 2008  . Obesity hypoventilation syndrome   . Arthritis   . Hiatal hernia   . Symptomatic bradycardia     With pauses 2008 - Followed by Dr. Graciela Husbands  . Cellulitis     Recurrent bilateral LE  . Mixed hyperlipidemia   . Peripheral vascular disease   . Myocardial infarction 1990's  . Sleep apnea   . Type II diabetes mellitus   . Gout   . Nonsustained ventricular tachycardia     Documented 2008  . SVT (supraventricular tachycardia)     Reported possible atrial arrhythmias   Past Surgical History  Procedure Laterality Date  . Abdominal hysterectomy  1980's  . Insert / replace / remove pacemaker  2010  . Tracheostomy tube placement N/A 03/04/2013    Procedure: TRACHEOSTOMY;  Surgeon: Darletta Moll, MD;  Location: [facility] OR;  Service: ENT;  Laterality: N/A;   Family History  Problem Relation Age of Onset  . Coronary artery disease Mother     MI age 29, died.  . Hypertension Father   . Osteoarthritis Father   . Hypertension Sister   . Hypertension Brother   . Obesity Father    History  Substance Use Topics  . Smoking status: Former Smoker -- 0.50 packs/day for .1 years    Types: Cigarettes    Quit date: 05/22/1967  . Smokeless tobacco: Never Used  . Alcohol Use: No   OB History   Grav Para Term Preterm Abortions TAB SAB Ect Mult Living   5 5 5       5      Review of Systems  Allergies  Morphine and related  Home Medications   Current  Outpatient Rx  Name  Route  Sig  Dispense  Refill  . acetaminophen (TYLENOL) 325 MG tablet   Oral   Take 2 tablets (650 mg total) by mouth every 6 (six) hours as needed for mild pain (or Fever >/= 101).         Marland Kitchen ALPRAZolam (XANAX) 0.5 MG tablet   Oral   Take 1 tablet (0.5 mg total) by mouth 2 (two) times daily as needed for anxiety.   60 tablet   0   . carvedilol (COREG) 25 MG tablet   Oral   Take 1 tablet (25 mg total) by mouth 2 (two) times daily.   60 tablet   3   . cloNIDine (CATAPRES) 0.3 MG tablet   Oral   Take 1 tablet (0.3 mg total) by mouth 3 (three) times daily.   90 tablet   3   . diltiazem (CARDIZEM) 30 MG tablet   Oral   Take 1 tablet (30 mg total) by mouth every 6 (six) hours.   120 tablet   12   . famotidine (PEPCID) 20 MG tablet   Oral   Take 1 tablet (20 mg total) by mouth 2 (two) times daily.   60 tablet   3   .  ferrous sulfate 325 (65 FE) MG tablet   Oral   Take 325 mg by mouth daily.           . hydrALAZINE (APRESOLINE) 100 MG tablet   Oral   Take 1 tablet (100 mg total) by mouth 4 (four) times daily.   120 tablet   3   . insulin aspart (NOVOLOG) 100 UNIT/ML injection   Subcutaneous   Inject 0-20 Units into the skin every 4 (four) hours. CBG < 70: give sugar packet under tongue or orange juice if awake and can swallow  CBG > 400 call MD       CBG 70 - 120: 0 units    CBG 121 - 150: 3 units    CBG 151 - 200: 4 units    CBG 201 - 250: 7 units    CBG 251 - 300: 11 units    CBG 301 - 350: 15 units    CBG 351 - 400: 20 units   1 vial   12   . insulin glargine (LANTUS) 100 UNIT/ML injection   Subcutaneous   Inject 0.4 mLs (40 Units total) into the skin daily.   10 mL   12   . Multiple Vitamins-Minerals (MULTIVITAMINS THER. W/MINERALS) TABS   Oral   Take 1 tablet by mouth daily.           Marland Kitchen neomycin-bacitracin-polymyxin (NEOSPORIN) [phone] ointment   Topical   Apply 1 application topically See admin instructions. Apply to  legs every Tuesday, Thursday and Saturday.         . nystatin cream (MYCOSTATIN)   Topical   Apply 1 application topically See admin instructions. Apply to folds of legs every Monday, Wednesday, and Friday.         Marland Kitchen oxyCODONE-acetaminophen (PERCOCET/ROXICET) 5-325 MG per tablet   Oral   Take 1 tablet by mouth every 6 (six) hours as needed for severe pain.   60 tablet   0   . spironolactone (ALDACTONE) 25 MG tablet   Oral   Take 25 mg by mouth daily.         . vitamin C (ASCORBIC ACID) 500 MG tablet   Oral   Take 500 mg by mouth daily.         Marland Kitchen levofloxacin (LEVAQUIN) 750 MG tablet   Oral   Take 1 tablet (750 mg total) by mouth daily. X 7 days   7 tablet   0    BP 128/60  Pulse 64  Resp 16  SpO2 100% Physical Exam  ED Course  Procedures (including critical care time) Labs Review Labs Reviewed  URINALYSIS, ROUTINE W REFLEX MICROSCOPIC   Imaging Review Dg Chest Port 1 View  04/08/2013   CLINICAL DATA:  Cough  EXAM: PORTABLE CHEST - 1 VIEW  COMPARISON:  03/22/2013  FINDINGS: The cardiac silhouette is enlarged. There is prominence of the interstitial markings, indistinctness of the pulmonary vasculature, and peribronchial cuffing. No focal regions of consolidation appreciated. The tracheostomy is appreciated tip projected level clavicles. The osseous structures unremarkable. A left-sided cardiac pacing unit is appreciated, dual lead, with leads projecting in the region the right atrium and right ventricle.  IMPRESSION: 1. Interstitial infiltrate likely reflecting pulmonary edema. 2. Cardiomegaly   Electronically Signed   By: Salome Holmes M.D.   On: 04/08/2013 15:05    EKG Interpretation   None       MDM   1. Bronchitis    The chart was scribed  for me under my direct supervision.  I personally performed the history, physical, and medical decision making and all procedures in the evaluation of this patient.[name], MD 04/08/13 [phone]

## 2013-04-08 NOTE — ED Notes (Signed)
Pt request that trach be checked and to be suctioned. R.T. Called and at bedside

## 2013-04-08 NOTE — ED Provider Notes (Signed)
CSN: [id number]     Arrival date & time 04/08/13  1349 History   First MD Initiated Contact with Patient 04/08/13 1418   This chart was scribed for [name], MD by Valera Castle, ED Scribe. This patient was seen in room APA07/APA07 and the patient's care was started at 2:25 PM.   No chief complaint on file.  (Consider location/radiation/quality/duration/timing/severity/associated sxs/prior Treatment) Patient is a 57 y.o. female presenting with shortness of breath. The history is provided by the patient and a relative. No language interpreter was used.  Shortness of Breath Severity:  Moderate Onset quality:  Sudden Duration:  2 days Timing:  Constant Progression:  Unchanged Chronicity:  Recurrent Relieved by:  Nothing Worsened by:  Deep breathing and exertion Associated symptoms: sputum production (yellow)   Associated symptoms: no abdominal pain, no chest pain, no cough, no headaches and no rash   Associated symptoms comment:  Positive for hematuria. Risk factors: obesity    HPI Comments: [name] is a 57 y.o. female brought in by a relative, with a h/o obesity, who presents to the Emergency Department complaining of sudden, moderate, constant SOB, with associated cough and congestion, productive of yellow sputum, onset 2 days ago. Her relative reports she had tracheal tube placed a few weeks ago and states when they suction her out there is yellow sputum. She states her doctor told them to come to the ER. She reports the pt being catheterized about 1 week ago and reports recently noticing blood in her urine. She states when the ventilation was placed on the pt, the oxygen came out of her nose. She denies any other associated symptoms.   PCP - Fredirick Maudlin, MD  Past Medical History  Diagnosis Date  . Morbid obesity   . Essential hypertension, benign   . Chronic diastolic heart failure     LVEF 60-65% 2011  . COPD (chronic obstructive pulmonary disease)     Oxygen  dependent  . History of cardiac catheterization     Normal coronary arteries 2008  . Obesity hypoventilation syndrome   . Arthritis   . Hiatal hernia   . Symptomatic bradycardia     With pauses 2008 - Followed by Dr. Graciela Husbands  . Cellulitis     Recurrent bilateral LE  . Mixed hyperlipidemia   . Peripheral vascular disease   . Myocardial infarction 1990's  . Sleep apnea   . Type II diabetes mellitus   . Gout   . Nonsustained ventricular tachycardia     Documented 2008  . SVT (supraventricular tachycardia)     Reported possible atrial arrhythmias   Past Surgical History  Procedure Laterality Date  . Abdominal hysterectomy  1980's  . Insert / replace / remove pacemaker  2010  . Tracheostomy tube placement N/A 03/04/2013    Procedure: TRACHEOSTOMY;  Surgeon: Darletta Moll, MD;  Location: [facility] OR;  Service: ENT;  Laterality: N/A;   Family History  Problem Relation Age of Onset  . Coronary artery disease Mother     MI age 18, died.  . Hypertension Father   . Osteoarthritis Father   . Hypertension Sister   . Hypertension Brother   . Obesity Father    History  Substance Use Topics  . Smoking status: Former Smoker -- 0.50 packs/day for .1 years    Types: Cigarettes    Quit date: 05/22/1967  . Smokeless tobacco: Never Used  . Alcohol Use: No   OB History   Grav Para Term  Preterm Abortions TAB SAB Ect Mult Living   5 5 5       5      Review of Systems  Constitutional: Negative for appetite change and fatigue.  HENT: Positive for congestion (yellow sputum). Negative for ear discharge and sinus pressure.   Eyes: Negative for discharge.  Respiratory: Positive for sputum production (yellow) and shortness of breath. Negative for cough.   Cardiovascular: Negative for chest pain.  Gastrointestinal: Negative for abdominal pain and diarrhea.  Genitourinary: Negative for frequency and hematuria.  Musculoskeletal: Negative for back pain.  Skin: Negative for rash.  Neurological: Negative  for seizures and headaches.  Psychiatric/Behavioral: Negative for hallucinations.    Allergies  Morphine and related  Home Medications   Current Outpatient Rx  Name  Route  Sig  Dispense  Refill  . acetaminophen (TYLENOL) 325 MG tablet   Oral   Take 2 tablets (650 mg total) by mouth every 6 (six) hours as needed for mild pain (or Fever >/= 101).         Marland Kitchen ALPRAZolam (XANAX) 0.5 MG tablet   Oral   Take 1 tablet (0.5 mg total) by mouth 2 (two) times daily as needed for anxiety.   60 tablet   0   . carvedilol (COREG) 25 MG tablet   Oral   Take 1 tablet (25 mg total) by mouth 2 (two) times daily.   60 tablet   3   . cloNIDine (CATAPRES) 0.3 MG tablet   Oral   Take 1 tablet (0.3 mg total) by mouth 3 (three) times daily.   90 tablet   3   . diltiazem (CARDIZEM) 30 MG tablet   Oral   Take 1 tablet (30 mg total) by mouth every 6 (six) hours.   120 tablet   12   . famotidine (PEPCID) 20 MG tablet   Oral   Take 1 tablet (20 mg total) by mouth 2 (two) times daily.   60 tablet   3   . ferrous sulfate 325 (65 FE) MG tablet   Oral   Take 325 mg by mouth daily.           . hydrALAZINE (APRESOLINE) 100 MG tablet   Oral   Take 1 tablet (100 mg total) by mouth 4 (four) times daily.   120 tablet   3   . insulin aspart (NOVOLOG) 100 UNIT/ML injection   Subcutaneous   Inject 0-20 Units into the skin every 4 (four) hours. CBG < 70: give sugar packet under tongue or orange juice if awake and can swallow  CBG > 400 call MD       CBG 70 - 120: 0 units    CBG 121 - 150: 3 units    CBG 151 - 200: 4 units    CBG 201 - 250: 7 units    CBG 251 - 300: 11 units    CBG 301 - 350: 15 units    CBG 351 - 400: 20 units   1 vial   12   . insulin glargine (LANTUS) 100 UNIT/ML injection   Subcutaneous   Inject 0.4 mLs (40 Units total) into the skin daily.   10 mL   12   . Multiple Vitamins-Minerals (MULTIVITAMINS THER. W/MINERALS) TABS   Oral   Take 1 tablet by mouth daily.            Marland Kitchen neomycin-bacitracin-polymyxin (NEOSPORIN) [phone] ointment   Topical   Apply 1 application topically See  admin instructions. Apply to legs every Tuesday, Thursday and Saturday.         . nystatin cream (MYCOSTATIN)   Topical   Apply 1 application topically See admin instructions. Apply to folds of legs every Monday, Wednesday, and Friday.         Marland Kitchen oxyCODONE-acetaminophen (PERCOCET/ROXICET) 5-325 MG per tablet   Oral   Take 1 tablet by mouth every 6 (six) hours as needed for severe pain.   60 tablet   0   . spironolactone (ALDACTONE) 25 MG tablet   Oral   Take 25 mg by mouth daily.         . vitamin C (ASCORBIC ACID) 500 MG tablet   Oral   Take 500 mg by mouth daily.          Triage Vitals: BP 128/60  Pulse 64  Resp 16  SpO2 100%  Physical Exam  Nursing note and vitals reviewed. Constitutional: She is oriented to person, place, and time. She appears well-developed.  Obese.  HENT:  Head: Normocephalic.  Tracheal tube in place.  Eyes: Conjunctivae and EOM are normal. No scleral icterus.  Neck: Neck supple. No thyromegaly present.  Cardiovascular: Normal rate and regular rhythm.  Exam reveals no gallop and no friction rub.   No murmur heard. 3+ edema in bilateral LE.  Pulmonary/Chest: No stridor. She has no wheezes. She has no rales. She exhibits no tenderness.  Abdominal: She exhibits no distension. There is no tenderness. There is no rebound.  Musculoskeletal: Normal range of motion. She exhibits no edema.  Lymphadenopathy:    She has no cervical adenopathy.  Neurological: She is oriented to person, place, and time. She exhibits normal muscle tone. Coordination normal.  Skin: No rash noted. No erythema.  Psychiatric: She has a normal mood and affect. Her behavior is normal.    ED Course  Procedures (including critical care time)  DIAGNOSTIC STUDIES: Oxygen Saturation is 100% on room air, normal by my interpretation.    COORDINATION OF  CARE: 2:28 PM-Discussed treatment plan which includes CXR and UA with pt at bedside and pt agreed to plan.   Labs Review Labs Reviewed  URINALYSIS, ROUTINE W REFLEX MICROSCOPIC   Imaging Review Dg Chest Port 1 View  04/08/2013   CLINICAL DATA:  Cough  EXAM: PORTABLE CHEST - 1 VIEW  COMPARISON:  03/22/2013  FINDINGS: The cardiac silhouette is enlarged. There is prominence of the interstitial markings, indistinctness of the pulmonary vasculature, and peribronchial cuffing. No focal regions of consolidation appreciated. The tracheostomy is appreciated tip projected level clavicles. The osseous structures unremarkable. A left-sided cardiac pacing unit is appreciated, dual lead, with leads projecting in the region the right atrium and right ventricle.  IMPRESSION: 1. Interstitial infiltrate likely reflecting pulmonary edema. 2. Cardiomegaly   Electronically Signed   By: Salome Holmes M.D.   On: 04/08/2013 15:05    EKG Interpretation   None       MDM  No diagnosis found.     The chart was scribed for me under my direct supervision.  I personally performed the history, physical, and medical decision making and all procedures in the evaluation of this patient.[name], MD 04/20/13 [phone]

## 2013-04-30 ENCOUNTER — Ambulatory Visit (INDEPENDENT_AMBULATORY_CARE_PROVIDER_SITE_OTHER): Payer: Medicaid Other | Admitting: Otolaryngology

## 2013-04-30 DIAGNOSIS — E671 Hypercarotinemia: Secondary | ICD-10-CM

## 2013-04-30 DIAGNOSIS — G4733 Obstructive sleep apnea (adult) (pediatric): Secondary | ICD-10-CM

## 2013-07-30 ENCOUNTER — Ambulatory Visit (INDEPENDENT_AMBULATORY_CARE_PROVIDER_SITE_OTHER): Payer: Medicaid Other | Admitting: Otolaryngology

## 2013-08-20 ENCOUNTER — Ambulatory Visit (INDEPENDENT_AMBULATORY_CARE_PROVIDER_SITE_OTHER): Payer: Medicaid Other | Admitting: Otolaryngology

## 2013-08-20 DIAGNOSIS — G4733 Obstructive sleep apnea (adult) (pediatric): Secondary | ICD-10-CM

## 2013-08-20 DIAGNOSIS — J9620 Acute and chronic respiratory failure, unspecified whether with hypoxia or hypercapnia: Secondary | ICD-10-CM

## 2013-09-17 ENCOUNTER — Ambulatory Visit (INDEPENDENT_AMBULATORY_CARE_PROVIDER_SITE_OTHER): Payer: Medicaid Other | Admitting: Otolaryngology

## 2013-09-17 DIAGNOSIS — J9620 Acute and chronic respiratory failure, unspecified whether with hypoxia or hypercapnia: Secondary | ICD-10-CM

## 2013-09-17 DIAGNOSIS — G4733 Obstructive sleep apnea (adult) (pediatric): Secondary | ICD-10-CM

## 2013-09-19 ENCOUNTER — Emergency Department (HOSPITAL_COMMUNITY)
Admission: EM | Admit: 2013-09-19 | Discharge: 2013-10-19 | Disposition: E | Payer: Medicaid Other | Attending: Emergency Medicine | Admitting: Emergency Medicine

## 2013-09-19 ENCOUNTER — Encounter (HOSPITAL_COMMUNITY): Payer: Self-pay | Admitting: Emergency Medicine

## 2013-09-19 DIAGNOSIS — Z79899 Other long term (current) drug therapy: Secondary | ICD-10-CM | POA: Insufficient documentation

## 2013-09-19 DIAGNOSIS — J392 Other diseases of pharynx: Secondary | ICD-10-CM | POA: Insufficient documentation

## 2013-09-19 DIAGNOSIS — Z794 Long term (current) use of insulin: Secondary | ICD-10-CM | POA: Insufficient documentation

## 2013-09-19 DIAGNOSIS — G4730 Sleep apnea, unspecified: Secondary | ICD-10-CM | POA: Insufficient documentation

## 2013-09-19 DIAGNOSIS — I469 Cardiac arrest, cause unspecified: Secondary | ICD-10-CM | POA: Insufficient documentation

## 2013-09-19 DIAGNOSIS — Z9981 Dependence on supplemental oxygen: Secondary | ICD-10-CM | POA: Insufficient documentation

## 2013-09-19 DIAGNOSIS — Z87891 Personal history of nicotine dependence: Secondary | ICD-10-CM | POA: Insufficient documentation

## 2013-09-19 DIAGNOSIS — Z8719 Personal history of other diseases of the digestive system: Secondary | ICD-10-CM | POA: Insufficient documentation

## 2013-09-19 DIAGNOSIS — I252 Old myocardial infarction: Secondary | ICD-10-CM | POA: Insufficient documentation

## 2013-09-19 DIAGNOSIS — I10 Essential (primary) hypertension: Secondary | ICD-10-CM | POA: Insufficient documentation

## 2013-09-19 DIAGNOSIS — Z9889 Other specified postprocedural states: Secondary | ICD-10-CM | POA: Insufficient documentation

## 2013-09-19 DIAGNOSIS — J441 Chronic obstructive pulmonary disease with (acute) exacerbation: Secondary | ICD-10-CM | POA: Insufficient documentation

## 2013-09-19 DIAGNOSIS — Z792 Long term (current) use of antibiotics: Secondary | ICD-10-CM | POA: Insufficient documentation

## 2013-09-19 DIAGNOSIS — I5032 Chronic diastolic (congestive) heart failure: Secondary | ICD-10-CM | POA: Insufficient documentation

## 2013-09-19 DIAGNOSIS — Z950 Presence of cardiac pacemaker: Secondary | ICD-10-CM | POA: Insufficient documentation

## 2013-09-19 DIAGNOSIS — Z872 Personal history of diseases of the skin and subcutaneous tissue: Secondary | ICD-10-CM | POA: Insufficient documentation

## 2013-09-19 DIAGNOSIS — E782 Mixed hyperlipidemia: Secondary | ICD-10-CM | POA: Insufficient documentation

## 2013-09-19 DIAGNOSIS — E662 Morbid (severe) obesity with alveolar hypoventilation: Secondary | ICD-10-CM | POA: Insufficient documentation

## 2013-09-19 DIAGNOSIS — E119 Type 2 diabetes mellitus without complications: Secondary | ICD-10-CM | POA: Insufficient documentation

## 2013-09-19 MED ORDER — EPINEPHRINE HCL 1 MG/ML IJ SOLN
1.0000 mg | Freq: Once | INTRAMUSCULAR | Status: AC
  Administered 2013-09-19: 1 mg via INTRAVENOUS

## 2013-09-19 MED ORDER — SODIUM BICARBONATE 8.4 % IV SOLN
50.0000 meq | Freq: Once | INTRAVENOUS | Status: AC
  Administered 2013-09-19: 50 meq via INTRAVENOUS

## 2013-09-21 MED FILL — Medication: Qty: 1 | Status: AC

## 2013-10-19 NOTE — ED Notes (Addendum)
Husband found pt hemorrhaging from trach tube this morning.  Pt was unresponsive at the time.   Significant amount of blood at scene. Intubated by ems with 6.0 tube.  Suctioned significant blood from lungs.  When ems arrived pt was in pea. ems gave 4 epi,  Lidocaine 100mg .  defib times 3 .  Right humerous IO started by ems. cpr in progress when arrived to er.

## 2013-10-19 NOTE — Procedures (Signed)
Intubation Procedure Note Rolanda LundborgVernice N Tabora [id number] 01-08-1956  Procedure: Intubation Indications: Respiratory insufficiency  Procedure Details Consent: Risks of procedure as well as the alternatives and risks of each were explained to the (patient/caregiver).  Consent for procedure obtained. on site by EMS Time Out: Verified patient identification, verified procedure,  verified correct patient position, special equipment available, medications/allergies/relevent history reviewed,  Performed  Maximum sterile technique was used including gloves, gown, hand hygiene and mask.  MAC and 4    Evaluation Hemodynamic Status:patient hypotensive; O2 sats: active CPR, SpO2 ranging 55-87% Patient's Current Condition: deceased Complications: No apparent complications noted once patient orally intubated with #7.5 (at approximately 0909) and secured at 22 cm right upper lip with commercial ET tube holder. #6 ETT (that EMS had intubated stoma with) was removed. Patient did tolerate intubation.  tube position acceptable, color change noted with CO2 detector.   Lina SayreVanessa Lynn Romano Stigger 10/18/2013

## 2013-10-19 NOTE — ED Provider Notes (Signed)
CSN: [id number]     Arrival date & time 09/25/2013  [id number] History  This chart was scribed for [name] Creasehristopher J. Pollina, MD by Quintella ReichertMatthew Underwood, ED scribe.  This patient was seen in room APA01/APA01 and the patient's care was started at 8:53 AM.   Chief Complaint  Patient presents with  . Cardiac Arrest    The history is provided by the EMS personnel. No language interpreter was used.     Level 5 Caveat: Respiratory Arrest  HPI Comments: [name] is a 58 y.o. female with h/o COPD, CHF, tracheostomy tube placement, morbid obesity, obesity hypoventilation syndrome, SVT, DM, HTN and hyperlipidemia brought in by EMS to the Emergency Department for respiratory arrest.  Per EMS, pt's husband found her this morning unresponsive and hemorrhaging blood from her trachea tube.  She lost a significant amount of blood at scene.  On EMS arrival she was unresponsive in an idioventricular rhythm.  En route EMS gave 4 epinephrine, Lidocaine 100mg , and 3 defibrillator attempts, with no response.  CPR is in progress on arrival.     Past Medical History  Diagnosis Date  . Morbid obesity   . Essential hypertension, benign   . Chronic diastolic heart failure     LVEF 60-65% 2011  . COPD (chronic obstructive pulmonary disease)     Oxygen dependent  . History of cardiac catheterization     Normal coronary arteries 2008  . Obesity hypoventilation syndrome   . Arthritis   . Hiatal hernia   . Symptomatic bradycardia     With pauses 2008 - Followed by Dr. Graciela HusbandsKlein  . Cellulitis     Recurrent bilateral LE  . Mixed hyperlipidemia   . Peripheral vascular disease   . Myocardial infarction 1990's  . Sleep apnea   . Type II diabetes mellitus   . Gout   . Nonsustained ventricular tachycardia     Documented 2008  . SVT (supraventricular tachycardia)     Reported possible atrial arrhythmias    Past Surgical History  Procedure Laterality Date  . Abdominal hysterectomy  1980's  . Insert / replace / remove  pacemaker  2010  . Tracheostomy tube placement N/A 03/04/2013    Procedure: TRACHEOSTOMY;  Surgeon: Darletta MollSui W Teoh, MD;  [facility];  Service: ENT;  Laterality: N/A;    Family History  Problem Relation Age of Onset  . Coronary artery disease Mother     MI age 58, died.  . Hypertension Father   . Osteoarthritis Father   . Hypertension Sister   . Hypertension Brother   . Obesity Father     History  Substance Use Topics  . Smoking status: Former Smoker -- 0.50 packs/day for .1 years    Types: Cigarettes    Quit date: 05/22/1967  . Smokeless tobacco: Never Used  . Alcohol Use: No    OB History   Grav Para Term Preterm Abortions TAB SAB Ect Mult Living   5 5 5       5        Review of Systems  Unable to perform ROS: Patient unresponsive      Allergies  Morphine and related  Home Medications   Prior to Admission medications   Medication Sig Start Date End Date Taking? Authorizing Provider  acetaminophen (TYLENOL) 325 MG tablet Take 2 tablets (650 mg total) by mouth every 6 (six) hours as needed for mild pain (or Fever >/= 101). 03/27/13   Jeanella CrazeBrandi L Ollis, NP  ALPRAZolam [name](XANAX)  0.5 MG tablet Take 1 tablet (0.5 mg total) by mouth 2 (two) times daily as needed for anxiety. 03/27/13   [name], NP  carvedilol (COREG) 25 MG tablet Take 1 tablet (25 mg total) by mouth 2 (two) times daily. 03/27/13   [name], NP  cloNIDine (CATAPRES) 0.3 MG tablet Take 1 tablet (0.3 mg total) by mouth 3 (three) times daily. 03/27/13   [name], NP  diltiazem (CARDIZEM) 30 MG tablet Take 1 tablet (30 mg total) by mouth every 6 (six) hours. 03/27/13   [name], NP  famotidine (PEPCID) 20 MG tablet Take 1 tablet (20 mg total) by mouth 2 (two) times daily. 03/27/13   [name], NP  ferrous sulfate 325 (65 FE) MG tablet Take 325 mg by mouth daily.      Historical Provider, MD  hydrALAZINE (APRESOLINE) 100 MG tablet Take 1 tablet (100 mg total) by mouth 4 (four) times daily.  03/27/13   [name], NP  insulin aspart (NOVOLOG) 100 UNIT/ML injection Inject 0-20 Units into the skin every 4 (four) hours. CBG < 70: give sugar packet under tongue or orange juice if awake and can swallow  CBG > 400 call MD       CBG 70 - 120: 0 units    CBG 121 - 150: 3 units    CBG 151 - 200: 4 units    CBG 201 - 250: 7 units    CBG 251 - 300: 11 units    CBG 301 - 350: 15 units    CBG 351 - 400: 20 units 03/27/13   [name], NP  insulin glargine (LANTUS) 100 UNIT/ML injection Inject 0.4 mLs (40 Units total) into the skin daily. 03/27/13   [name], NP  levofloxacin (LEVAQUIN) 750 MG tablet Take 1 tablet (750 mg total) by mouth daily. X 7 days 04/08/13   [name], MD  Multiple Vitamins-Minerals (MULTIVITAMINS THER. W/MINERALS) TABS Take 1 tablet by mouth daily.      Historical Provider, MD  neomycin-bacitracin-polymyxin (NEOSPORIN) [phone] ointment Apply 1 application topically See admin instructions. Apply to legs every Tuesday, Thursday and Saturday.    Historical Provider, MD  nystatin cream (MYCOSTATIN) Apply 1 application topically See admin instructions. Apply to folds of legs every Monday, Wednesday, and Friday.    Historical Provider, MD  oxyCODONE-acetaminophen (PERCOCET/ROXICET) 5-325 MG per tablet Take 1 tablet by mouth every 6 (six) hours as needed for severe pain. 03/27/13   [name], NP  spironolactone (ALDACTONE) 25 MG tablet Take 25 mg by mouth daily.    Historical Provider, MD  vitamin C (ASCORBIC ACID) 500 MG tablet Take 500 mg by mouth daily.    Historical Provider, MD   BP 96/81  Pulse 109  Wt 400 lb (181.439 kg)  SpO2 56%  Physical Exam  Constitutional:  Morbidly obese  HENT:  Head: Atraumatic.  Eyes:  Fixed and dilated  Neck:  ET tube in place through the tracheostomy stoma with large amount of blood present  Cardiovascular:  No cardiac activity  Pulmonary/Chest:  Significantly decreased breath sounds bilaterally with  bagging  Abdominal: Soft.  Musculoskeletal:  Atraumatic  Neurological: She is unresponsive. GCS eye subscore is 1. GCS verbal subscore is 1. GCS motor subscore is 1.  No response to stimuli  No cranial nerve reflexes present    ED Course  Procedures (including critical care time)  Cardiopulmonary Resuscitation (CPR) Procedure Note Directed/Performed  by: [name] I personally directed ancillary staff and/or performed CPR in an effort to regain return of spontaneous circulation and to maintain cardiac, neuro and systemic perfusion.   Procedure: Intubation Permit was implied secondary to emergent situation. A glideScope was utilized. A #4 blade was inserted into the oropharynx at which time the vocal cords were visualized. There was extensive blood and clots the airway, however. These were removed with suction and McGill forceps. This had to be performed multiple times, and is clots forming from ongoing bleeding and presumably from passage up from the lungs into the oropharynx. A 7.5-French endotracheal tube was inserted and visualized going through the vocal cords. The stylette was removed. Colorimetric change was visualized on the CO2 meter. Breath sounds were heard in both lung fields equally.   CRITICAL CARE Performed by: [name]   Total critical care time:  Critical care time was exclusive of separately billable procedures and treating other patients.  Critical care was necessary to treat or prevent imminent or life-threatening deterioration.  Critical care was time spent personally by me on the following activities: development of treatment plan with patient and/or surrogate as well as nursing, discussions with consultants, evaluation of patient's response to treatment, examination of patient, obtaining history from patient or surrogate, ordering and performing treatments and interventions, ordering and review of laboratory studies, ordering and  review of radiographic studies, pulse oximetry and re-evaluation of patient's condition. Care  Extensive amount of time spent on counseling with multiple patient members, both as a group and individually.    Labs Review Labs Reviewed - No data to display  Imaging Review No results found.   EKG Interpretation None      MDM   Final diagnoses:  1. cardiopulmonary arrest  2. Airway hemorrhage    Patient presents to the ER by EMS with CPR in progress the patient was found by husband with bleeding from her trachea. He tried to suction the site, but bleeding worsened. She was in a great deal of respiratory distress by that point. Husband tried to bag her, but could not because the bag kept filling up with blood. There was a significant amount of blood loss at the scene according to EMS.  EMS took her tracheostomy cannula out and passed a 6 endotracheal tube through the stoma. She was then bagged via the ET tube. Patient had received lidocaine and epinephrine during transport, continuous CPR. EMS reports that initially there was some idioventricular rhythm followed by V. tach/V. fib. She was shocked 3 times and at arrival to the ER was in asystole.  Extensive time was spent clearing her airway of blood clots. This was performed with suction as well as McGill forceps, assisted by the GlideScope. Once the airway was cleared I was able to pass a 7.5 endotracheal tube and oral tracheal fashion. She did have equal breath sounds bilaterally and the lungs were suctioned via the new endotracheal tube.  CPR was continued throughout the period after here in the ER. Patient received doses of epinephrine and bicarbonate, but there was never any cardiac activity noted throughout the process. Once the airway was clear of clots and she was easier to bag, a final dose of epi and bicarbonate were administered. CPR was continued for a period of several minutes and once asystole was confirmed again, despite open  airway, she was declared dead.  Case discussed briefly with Doctor Ouida Sills, covering for Doctor Juanetta Gosling. This was to inform the patient's primary care  physician of her death. She has not medical examiner's case.   I personally performed the services described in this documentation, which was scribed in my presence. The recorded information has been reviewed and is accurate.    [name] Creasehristopher J. Pollina, MD 10/08/2013 1041

## 2013-10-19 DEATH — deceased

## 2014-02-06 IMAGING — CR DG CHEST 1V PORT
1 series · 1 of 1 positions shown · non-contrast
Comparison: 03/22/2013

CLINICAL DATA: Cough

EXAM:
PORTABLE CHEST - 1 VIEW

[erect ap]
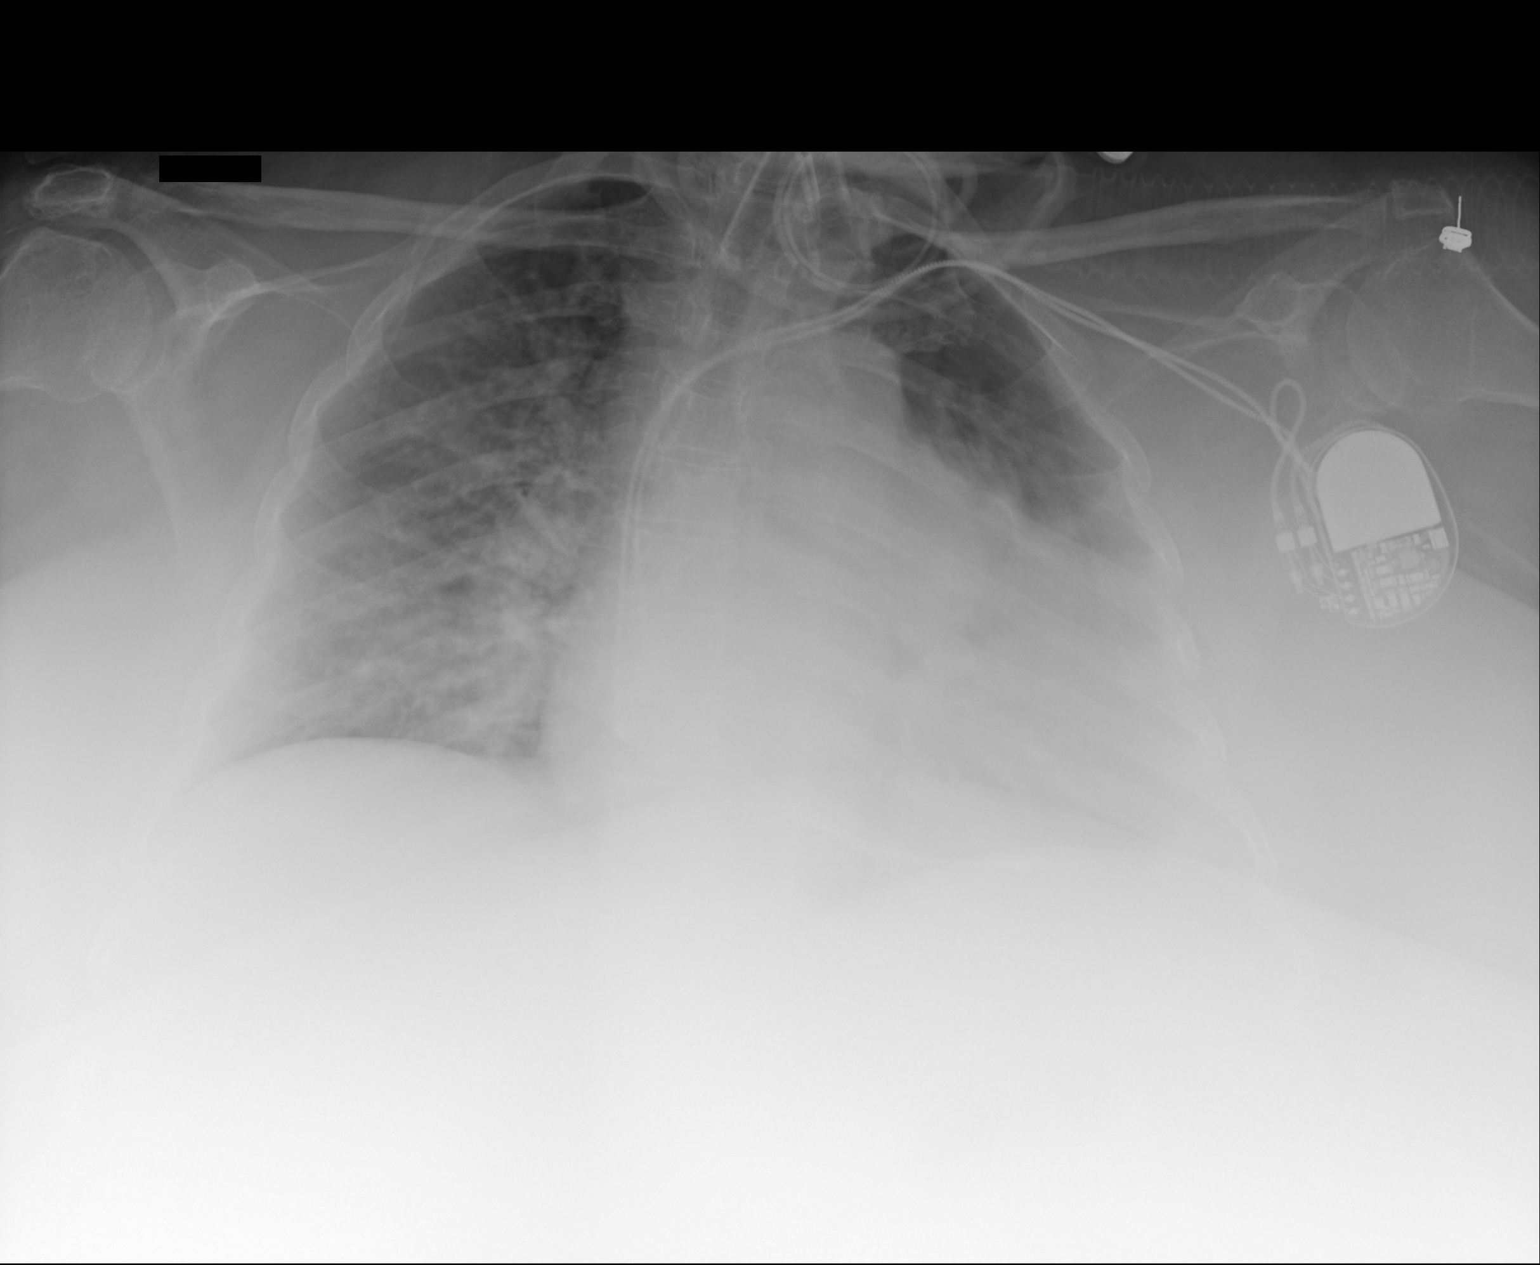

[1 of 1 positions shown; findings below may reference images not displayed]

FINDINGS: The cardiac silhouette is enlarged. There is prominence of the
interstitial markings, indistinctness of the pulmonary vasculature,
and peribronchial cuffing. No focal regions of consolidation
appreciated. The tracheostomy is appreciated tip projected level
clavicles. The osseous structures unremarkable. A left-sided cardiac
pacing unit is appreciated, dual lead, with leads projecting in the
region the right atrium and right ventricle.
IMPRESSION: 1. Interstitial infiltrate likely reflecting pulmonary edema.
2. Cardiomegaly

## 2014-03-22 ENCOUNTER — Encounter (HOSPITAL_COMMUNITY): Payer: Self-pay | Admitting: Emergency Medicine

## 2015-01-12 LAB — BLOOD GAS, ARTERIAL
Acid-Base Excess: 11.3 mmol/L — ABNORMAL HIGH (ref 0.0–2.0)
Bicarbonate: 36 mEq/L — ABNORMAL HIGH (ref 20.0–24.0)
Drawn by: 22223
FIO2: 35
MECHVT: 450 mL
O2 Saturation: 97.1 %
PEEP: 5 cmH2O
Patient temperature: 37
RATE: 16 resp/min
TCO2: 33.6 mmol/L (ref 0–100)
pCO2 arterial: 54.6 mmHg — ABNORMAL HIGH (ref 35.0–45.0)
pH, Arterial: 7.435 (ref 7.350–7.450)
pO2, Arterial: 98.2 mmHg (ref 80.0–100.0)
